# Patient Record
Sex: Male | Born: 1945 | Race: Black or African American | Hispanic: No | Marital: Married | Smoking: Never smoker
Health system: Southern US, Community
[De-identification: ages and names within clinical notes are randomized; demographics above are authoritative.]

## PROBLEM LIST (undated history)

## (undated) DIAGNOSIS — R5383 Other fatigue: Secondary | ICD-10-CM

## (undated) DIAGNOSIS — M1990 Unspecified osteoarthritis, unspecified site: Secondary | ICD-10-CM

## (undated) DIAGNOSIS — G4733 Obstructive sleep apnea (adult) (pediatric): Secondary | ICD-10-CM

## (undated) DIAGNOSIS — C61 Malignant neoplasm of prostate: Secondary | ICD-10-CM

## (undated) DIAGNOSIS — R590 Localized enlarged lymph nodes: Secondary | ICD-10-CM

## (undated) DIAGNOSIS — Z87448 Personal history of other diseases of urinary system: Secondary | ICD-10-CM

## (undated) DIAGNOSIS — N1330 Unspecified hydronephrosis: Secondary | ICD-10-CM

## (undated) DIAGNOSIS — R3915 Urgency of urination: Secondary | ICD-10-CM

## (undated) DIAGNOSIS — Z86718 Personal history of other venous thrombosis and embolism: Secondary | ICD-10-CM

## (undated) DIAGNOSIS — Z95828 Presence of other vascular implants and grafts: Secondary | ICD-10-CM

## (undated) DIAGNOSIS — N529 Male erectile dysfunction, unspecified: Secondary | ICD-10-CM

## (undated) DIAGNOSIS — Z86711 Personal history of pulmonary embolism: Secondary | ICD-10-CM

## (undated) DIAGNOSIS — R110 Nausea: Secondary | ICD-10-CM

## (undated) DIAGNOSIS — Z9989 Dependence on other enabling machines and devices: Secondary | ICD-10-CM

## (undated) DIAGNOSIS — T451X5A Adverse effect of antineoplastic and immunosuppressive drugs, initial encounter: Secondary | ICD-10-CM

## (undated) DIAGNOSIS — C7951 Secondary malignant neoplasm of bone: Secondary | ICD-10-CM

## (undated) DIAGNOSIS — N2889 Other specified disorders of kidney and ureter: Secondary | ICD-10-CM

## (undated) DIAGNOSIS — I10 Essential (primary) hypertension: Secondary | ICD-10-CM

## (undated) HISTORY — DX: Malignant neoplasm of prostate: C61

## (undated) HISTORY — PX: TOTAL KNEE ARTHROPLASTY: SHX125

## (undated) HISTORY — PX: OTHER SURGICAL HISTORY: SHX169

## (undated) HISTORY — DX: Unspecified osteoarthritis, unspecified site: M19.90

## (undated) HISTORY — DX: Essential (primary) hypertension: I10

## (undated) SURGERY — CYSTOSCOPY, FLEXIBLE, WITH STENT REPLACEMENT
Anesthesia: Choice | Laterality: Right

---

## 1999-03-10 ENCOUNTER — Ambulatory Visit (HOSPITAL_BASED_OUTPATIENT_CLINIC_OR_DEPARTMENT_OTHER): Admission: RE | Admit: 1999-03-10 | Discharge: 1999-03-10 | Payer: Self-pay | Admitting: General Surgery

## 1999-12-23 ENCOUNTER — Other Ambulatory Visit: Admission: RE | Admit: 1999-12-23 | Discharge: 1999-12-23 | Payer: Self-pay | Admitting: Urology

## 2000-02-09 HISTORY — PX: PROSTATECTOMY: SHX69

## 2000-02-24 ENCOUNTER — Encounter: Payer: Self-pay | Admitting: Urology

## 2000-02-29 ENCOUNTER — Encounter (INDEPENDENT_AMBULATORY_CARE_PROVIDER_SITE_OTHER): Payer: Self-pay | Admitting: Specialist

## 2000-02-29 ENCOUNTER — Inpatient Hospital Stay (HOSPITAL_COMMUNITY): Admission: RE | Admit: 2000-02-29 | Discharge: 2000-03-02 | Payer: Self-pay | Admitting: Urology

## 2000-06-22 ENCOUNTER — Encounter: Admission: RE | Admit: 2000-06-22 | Discharge: 2000-09-20 | Payer: Self-pay | Admitting: Radiation Oncology

## 2001-05-29 ENCOUNTER — Encounter: Payer: Self-pay | Admitting: Urology

## 2001-06-05 ENCOUNTER — Inpatient Hospital Stay (HOSPITAL_COMMUNITY): Admission: RE | Admit: 2001-06-05 | Discharge: 2001-06-06 | Payer: Self-pay | Admitting: Urology

## 2003-07-17 ENCOUNTER — Encounter: Payer: Self-pay | Admitting: Family Medicine

## 2003-07-17 ENCOUNTER — Encounter: Admission: RE | Admit: 2003-07-17 | Discharge: 2003-07-17 | Payer: Self-pay | Admitting: Family Medicine

## 2003-10-12 HISTORY — PX: PENILE PROSTHESIS IMPLANT: SHX240

## 2004-02-05 ENCOUNTER — Encounter: Admission: RE | Admit: 2004-02-05 | Discharge: 2004-02-05 | Payer: Self-pay | Admitting: Family Medicine

## 2004-03-26 ENCOUNTER — Inpatient Hospital Stay (HOSPITAL_COMMUNITY): Admission: RE | Admit: 2004-03-26 | Discharge: 2004-04-04 | Payer: Self-pay | Admitting: Orthopedic Surgery

## 2006-11-24 ENCOUNTER — Ambulatory Visit: Payer: Self-pay | Admitting: Gastroenterology

## 2006-12-01 ENCOUNTER — Encounter: Admission: RE | Admit: 2006-12-01 | Discharge: 2006-12-01 | Payer: Self-pay | Admitting: Family Medicine

## 2006-12-05 ENCOUNTER — Ambulatory Visit: Payer: Self-pay | Admitting: Gastroenterology

## 2006-12-05 ENCOUNTER — Encounter (INDEPENDENT_AMBULATORY_CARE_PROVIDER_SITE_OTHER): Payer: Self-pay | Admitting: Specialist

## 2006-12-15 ENCOUNTER — Encounter (HOSPITAL_COMMUNITY): Admission: RE | Admit: 2006-12-15 | Discharge: 2006-12-20 | Payer: Self-pay | Admitting: Urology

## 2008-02-27 ENCOUNTER — Encounter: Admission: RE | Admit: 2008-02-27 | Discharge: 2008-02-27 | Payer: Self-pay | Admitting: Family Medicine

## 2008-06-19 ENCOUNTER — Ambulatory Visit (HOSPITAL_COMMUNITY): Admission: RE | Admit: 2008-06-19 | Discharge: 2008-06-19 | Payer: Self-pay | Admitting: Urology

## 2008-09-03 ENCOUNTER — Encounter: Admission: RE | Admit: 2008-09-03 | Discharge: 2008-09-03 | Payer: Self-pay | Admitting: Family Medicine

## 2008-09-07 ENCOUNTER — Inpatient Hospital Stay (HOSPITAL_COMMUNITY): Admission: EM | Admit: 2008-09-07 | Discharge: 2008-09-11 | Payer: Self-pay | Admitting: Emergency Medicine

## 2008-09-09 ENCOUNTER — Ambulatory Visit: Payer: Self-pay | Admitting: Vascular Surgery

## 2008-09-09 ENCOUNTER — Encounter (INDEPENDENT_AMBULATORY_CARE_PROVIDER_SITE_OTHER): Payer: Self-pay | Admitting: Internal Medicine

## 2008-10-11 HISTORY — PX: INGUINAL HERNIA REPAIR: SUR1180

## 2009-01-06 ENCOUNTER — Ambulatory Visit: Payer: Self-pay | Admitting: Surgery

## 2009-01-08 ENCOUNTER — Ambulatory Visit: Payer: Self-pay | Admitting: Surgery

## 2009-03-25 ENCOUNTER — Ambulatory Visit (HOSPITAL_COMMUNITY): Admission: RE | Admit: 2009-03-25 | Discharge: 2009-03-25 | Payer: Self-pay | Admitting: Surgery

## 2009-03-25 ENCOUNTER — Ambulatory Visit: Payer: Self-pay | Admitting: Surgery

## 2009-04-23 ENCOUNTER — Inpatient Hospital Stay (HOSPITAL_COMMUNITY): Admission: RE | Admit: 2009-04-23 | Discharge: 2009-04-27 | Payer: Self-pay | Admitting: Orthopedic Surgery

## 2009-05-05 ENCOUNTER — Ambulatory Visit: Payer: Self-pay | Admitting: Surgery

## 2009-05-29 ENCOUNTER — Ambulatory Visit: Payer: Self-pay | Admitting: Surgery

## 2009-05-29 ENCOUNTER — Ambulatory Visit (HOSPITAL_COMMUNITY): Admission: RE | Admit: 2009-05-29 | Discharge: 2009-05-29 | Payer: Self-pay | Admitting: Surgery

## 2009-10-02 ENCOUNTER — Ambulatory Visit: Payer: Self-pay | Admitting: Oncology

## 2009-10-17 LAB — TESTOSTERONE: Testosterone: 25.16 ng/dL — ABNORMAL LOW (ref 350–890)

## 2009-10-17 LAB — COMPREHENSIVE METABOLIC PANEL
Albumin: 4.6 g/dL (ref 3.5–5.2)
BUN: 11 mg/dL (ref 6–23)
CO2: 23 mEq/L (ref 19–32)
Creatinine, Ser: 1.06 mg/dL (ref 0.40–1.50)
Potassium: 3.8 mEq/L (ref 3.5–5.3)
Total Protein: 7.7 g/dL (ref 6.0–8.3)

## 2009-10-17 LAB — CBC WITH DIFFERENTIAL/PLATELET
BASO%: 0.4 % (ref 0.0–2.0)
Eosinophils Absolute: 0.5 10*3/uL (ref 0.0–0.5)
HCT: 36.4 % — ABNORMAL LOW (ref 38.4–49.9)
HGB: 11.8 g/dL — ABNORMAL LOW (ref 13.0–17.1)
MONO#: 0.5 10*3/uL (ref 0.1–0.9)
NEUT%: 64.7 % (ref 39.0–75.0)
RBC: 4.35 10*6/uL (ref 4.20–5.82)
WBC: 7.8 10*3/uL (ref 4.0–10.3)

## 2009-10-17 LAB — PSA: PSA: 15.74 ng/mL — ABNORMAL HIGH (ref 0.10–4.00)

## 2009-10-23 ENCOUNTER — Ambulatory Visit (HOSPITAL_COMMUNITY): Admission: RE | Admit: 2009-10-23 | Discharge: 2009-10-23 | Payer: Self-pay | Admitting: Oncology

## 2009-11-03 ENCOUNTER — Ambulatory Visit (HOSPITAL_COMMUNITY): Admission: RE | Admit: 2009-11-03 | Discharge: 2009-11-03 | Payer: Self-pay | Admitting: Oncology

## 2009-11-07 ENCOUNTER — Ambulatory Visit: Payer: Self-pay | Admitting: Oncology

## 2009-12-04 LAB — COMPREHENSIVE METABOLIC PANEL
Albumin: 4.3 g/dL (ref 3.5–5.2)
BUN: 10 mg/dL (ref 6–23)
CO2: 25 mEq/L (ref 19–32)
Calcium: 9.1 mg/dL (ref 8.4–10.5)
Chloride: 104 mEq/L (ref 96–112)
Creatinine, Ser: 0.88 mg/dL (ref 0.40–1.50)
Potassium: 4 mEq/L (ref 3.5–5.3)

## 2009-12-04 LAB — PSA: PSA: 18.64 ng/mL — ABNORMAL HIGH (ref 0.10–4.00)

## 2009-12-04 LAB — CBC WITH DIFFERENTIAL/PLATELET
Basophils Absolute: 0 10*3/uL (ref 0.0–0.1)
Eosinophils Absolute: 0.3 10*3/uL (ref 0.0–0.5)
HCT: 36 % — ABNORMAL LOW (ref 38.4–49.9)
HGB: 11.6 g/dL — ABNORMAL LOW (ref 13.0–17.1)
MONO#: 0.4 10*3/uL (ref 0.1–0.9)
NEUT#: 4.6 10*3/uL (ref 1.5–6.5)
NEUT%: 67.2 % (ref 39.0–75.0)
RDW: 16 % — ABNORMAL HIGH (ref 11.0–14.6)
WBC: 6.9 10*3/uL (ref 4.0–10.3)
lymph#: 1.5 10*3/uL (ref 0.9–3.3)

## 2010-01-02 ENCOUNTER — Ambulatory Visit: Payer: Self-pay | Admitting: Oncology

## 2010-01-06 LAB — CBC WITH DIFFERENTIAL/PLATELET
Eosinophils Absolute: 0.5 10*3/uL (ref 0.0–0.5)
LYMPH%: 24.2 % (ref 14.0–49.0)
MCV: 84.6 fL (ref 79.3–98.0)
MONO%: 7.2 % (ref 0.0–14.0)
NEUT#: 4.2 10*3/uL (ref 1.5–6.5)
NEUT%: 61.5 % (ref 39.0–75.0)
Platelets: 218 10*3/uL (ref 140–400)
RBC: 4.17 10*6/uL — ABNORMAL LOW (ref 4.20–5.82)

## 2010-01-06 LAB — COMPREHENSIVE METABOLIC PANEL
Alkaline Phosphatase: 66 U/L (ref 39–117)
BUN: 12 mg/dL (ref 6–23)
Creatinine, Ser: 0.86 mg/dL (ref 0.40–1.50)
Glucose, Bld: 136 mg/dL — ABNORMAL HIGH (ref 70–99)
Sodium: 140 mEq/L (ref 135–145)
Total Bilirubin: 0.4 mg/dL (ref 0.3–1.2)
Total Protein: 7.3 g/dL (ref 6.0–8.3)

## 2010-01-06 LAB — TESTOSTERONE: Testosterone: 23.85 ng/dL — ABNORMAL LOW (ref 350–890)

## 2010-02-09 ENCOUNTER — Ambulatory Visit: Payer: Self-pay | Admitting: Oncology

## 2010-02-10 LAB — CBC WITH DIFFERENTIAL/PLATELET
Eosinophils Absolute: 0.4 10*3/uL (ref 0.0–0.5)
HCT: 33.8 % — ABNORMAL LOW (ref 38.4–49.9)
HGB: 10.9 g/dL — ABNORMAL LOW (ref 13.0–17.1)
MCH: 27.1 pg — ABNORMAL LOW (ref 27.2–33.4)
MCHC: 32.2 g/dL (ref 32.0–36.0)
MONO#: 0.6 10*3/uL (ref 0.1–0.9)
MONO%: 7.3 % (ref 0.0–14.0)
RBC: 4.02 10*6/uL — ABNORMAL LOW (ref 4.20–5.82)
RDW: 16.3 % — ABNORMAL HIGH (ref 11.0–14.6)
WBC: 7.9 10*3/uL (ref 4.0–10.3)
lymph#: 1.8 10*3/uL (ref 0.9–3.3)

## 2010-02-10 LAB — COMPREHENSIVE METABOLIC PANEL
ALT: 23 U/L (ref 0–53)
BUN: 11 mg/dL (ref 6–23)
CO2: 29 mEq/L (ref 19–32)
Calcium: 8.8 mg/dL (ref 8.4–10.5)
Creatinine, Ser: 0.91 mg/dL (ref 0.40–1.50)
Potassium: 3.4 mEq/L — ABNORMAL LOW (ref 3.5–5.3)
Sodium: 142 mEq/L (ref 135–145)
Total Bilirubin: 0.5 mg/dL (ref 0.3–1.2)
Total Protein: 7 g/dL (ref 6.0–8.3)

## 2010-03-11 ENCOUNTER — Ambulatory Visit: Payer: Self-pay | Admitting: Oncology

## 2010-03-11 LAB — CBC WITH DIFFERENTIAL/PLATELET
Eosinophils Absolute: 0.4 10*3/uL (ref 0.0–0.5)
HCT: 36 % — ABNORMAL LOW (ref 38.4–49.9)
MCHC: 31.9 g/dL — ABNORMAL LOW (ref 32.0–36.0)
MONO#: 0.5 10*3/uL (ref 0.1–0.9)
MONO%: 7.4 % (ref 0.0–14.0)
Platelets: 199 10*3/uL (ref 140–400)
RBC: 4.33 10*6/uL (ref 4.20–5.82)
RDW: 16 % — ABNORMAL HIGH (ref 11.0–14.6)
lymph#: 2 10*3/uL (ref 0.9–3.3)

## 2010-04-16 ENCOUNTER — Ambulatory Visit: Payer: Self-pay | Admitting: Oncology

## 2010-04-20 LAB — COMPREHENSIVE METABOLIC PANEL
ALT: 26 U/L (ref 0–53)
Albumin: 4.2 g/dL (ref 3.5–5.2)
Alkaline Phosphatase: 71 U/L (ref 39–117)
BUN: 11 mg/dL (ref 6–23)
CO2: 25 mEq/L (ref 19–32)
Calcium: 9.2 mg/dL (ref 8.4–10.5)
Creatinine, Ser: 0.84 mg/dL (ref 0.40–1.50)
Glucose, Bld: 151 mg/dL — ABNORMAL HIGH (ref 70–99)

## 2010-04-20 LAB — PSA: PSA: 43.58 ng/mL — ABNORMAL HIGH (ref 0.10–4.00)

## 2010-04-20 LAB — CBC WITH DIFFERENTIAL/PLATELET
Basophils Absolute: 0 10*3/uL (ref 0.0–0.1)
EOS%: 9.8 % — ABNORMAL HIGH (ref 0.0–7.0)
HCT: 36.8 % — ABNORMAL LOW (ref 38.4–49.9)
HGB: 11.7 g/dL — ABNORMAL LOW (ref 13.0–17.1)
MCHC: 31.8 g/dL — ABNORMAL LOW (ref 32.0–36.0)
MONO#: 0.3 10*3/uL (ref 0.1–0.9)
NEUT#: 3.8 10*3/uL (ref 1.5–6.5)
NEUT%: 62.7 % (ref 39.0–75.0)
Platelets: 154 10*3/uL (ref 140–400)
RDW: 16.2 % — ABNORMAL HIGH (ref 11.0–14.6)
nRBC: 0 % (ref 0–0)

## 2010-05-12 LAB — CBC WITH DIFFERENTIAL/PLATELET
BASO%: 0.5 % (ref 0.0–2.0)
Basophils Absolute: 0 10*3/uL (ref 0.0–0.1)
EOS%: 24 % — ABNORMAL HIGH (ref 0.0–7.0)
Eosinophils Absolute: 1.7 10*3/uL — ABNORMAL HIGH (ref 0.0–0.5)
HCT: 34.5 % — ABNORMAL LOW (ref 38.4–49.9)
LYMPH%: 26.9 % (ref 14.0–49.0)
MCH: 27.1 pg — ABNORMAL LOW (ref 27.2–33.4)
Platelets: 206 10*3/uL (ref 140–400)
WBC: 7.2 10*3/uL (ref 4.0–10.3)

## 2010-05-12 LAB — COMPREHENSIVE METABOLIC PANEL
Alkaline Phosphatase: 73 U/L (ref 39–117)
BUN: 14 mg/dL (ref 6–23)
Calcium: 9.3 mg/dL (ref 8.4–10.5)
Chloride: 106 mEq/L (ref 96–112)
Creatinine, Ser: 0.9 mg/dL (ref 0.40–1.50)
Glucose, Bld: 106 mg/dL — ABNORMAL HIGH (ref 70–99)
Sodium: 141 mEq/L (ref 135–145)

## 2010-06-11 ENCOUNTER — Ambulatory Visit: Payer: Self-pay | Admitting: Oncology

## 2010-06-25 LAB — CBC WITH DIFFERENTIAL/PLATELET
HGB: 11.3 g/dL — ABNORMAL LOW (ref 13.0–17.1)
MCH: 25.7 pg — ABNORMAL LOW (ref 27.2–33.4)
MCHC: 30.9 g/dL — ABNORMAL LOW (ref 32.0–36.0)
MONO#: 0.4 10*3/uL (ref 0.1–0.9)
NEUT#: 3.4 10*3/uL (ref 1.5–6.5)
NEUT%: 60.5 % (ref 39.0–75.0)
Platelets: 200 10*3/uL (ref 140–400)
WBC: 5.6 10*3/uL (ref 4.0–10.3)

## 2010-06-25 LAB — PSA: PSA: 64.36 ng/mL — ABNORMAL HIGH (ref 0.10–4.00)

## 2010-06-25 LAB — COMPREHENSIVE METABOLIC PANEL
AST: 18 U/L (ref 0–37)
Albumin: 4 g/dL (ref 3.5–5.2)
Alkaline Phosphatase: 67 U/L (ref 39–117)
BUN: 12 mg/dL (ref 6–23)
Calcium: 8.7 mg/dL (ref 8.4–10.5)
Creatinine, Ser: 0.92 mg/dL (ref 0.40–1.50)
Total Protein: 6.8 g/dL (ref 6.0–8.3)

## 2010-08-04 ENCOUNTER — Ambulatory Visit: Payer: Self-pay | Admitting: Oncology

## 2010-08-06 LAB — COMPREHENSIVE METABOLIC PANEL
ALT: 19 U/L (ref 0–53)
Albumin: 3.9 g/dL (ref 3.5–5.2)
Alkaline Phosphatase: 63 U/L (ref 39–117)
BUN: 13 mg/dL (ref 6–23)
CO2: 27 mEq/L (ref 19–32)
Calcium: 8.9 mg/dL (ref 8.4–10.5)
Chloride: 107 mEq/L (ref 96–112)
Glucose, Bld: 122 mg/dL — ABNORMAL HIGH (ref 70–99)
Potassium: 3.6 mEq/L (ref 3.5–5.3)

## 2010-08-06 LAB — CBC WITH DIFFERENTIAL/PLATELET
BASO%: 0.4 % (ref 0.0–2.0)
HCT: 34 % — ABNORMAL LOW (ref 38.4–49.9)
LYMPH%: 24.7 % (ref 14.0–49.0)
MONO%: 6.9 % (ref 0.0–14.0)
NEUT#: 3.4 10*3/uL (ref 1.5–6.5)
NEUT%: 61.2 % (ref 39.0–75.0)
Platelets: 205 10*3/uL (ref 140–400)
WBC: 5.6 10*3/uL (ref 4.0–10.3)

## 2010-08-27 ENCOUNTER — Ambulatory Visit (HOSPITAL_COMMUNITY): Admission: RE | Admit: 2010-08-27 | Discharge: 2010-08-27 | Payer: Self-pay | Admitting: Oncology

## 2010-09-04 ENCOUNTER — Ambulatory Visit (HOSPITAL_COMMUNITY)
Admission: RE | Admit: 2010-09-04 | Discharge: 2010-09-04 | Payer: Self-pay | Source: Home / Self Care | Admitting: Oncology

## 2010-10-02 ENCOUNTER — Ambulatory Visit: Payer: Self-pay | Admitting: Oncology

## 2010-10-07 LAB — CBC WITH DIFFERENTIAL/PLATELET
Basophils Absolute: 0 10*3/uL (ref 0.0–0.1)
EOS%: 6.3 % (ref 0.0–7.0)
HGB: 11.3 g/dL — ABNORMAL LOW (ref 13.0–17.1)
MCH: 27 pg — ABNORMAL LOW (ref 27.2–33.4)
MCV: 83.6 fL (ref 79.3–98.0)
MONO%: 7.6 % (ref 0.0–14.0)
NEUT#: 4 10*3/uL (ref 1.5–6.5)
RBC: 4.2 10*6/uL (ref 4.20–5.82)
RDW: 17.4 % — ABNORMAL HIGH (ref 11.0–14.6)
lymph#: 1.3 10*3/uL (ref 0.9–3.3)

## 2010-10-08 LAB — COMPREHENSIVE METABOLIC PANEL
ALT: 18 U/L (ref 0–53)
AST: 14 U/L (ref 0–37)
Albumin: 4.1 g/dL (ref 3.5–5.2)
Alkaline Phosphatase: 66 U/L (ref 39–117)
BUN: 11 mg/dL (ref 6–23)
Calcium: 8.9 mg/dL (ref 8.4–10.5)
Chloride: 106 mEq/L (ref 96–112)
Potassium: 3.7 mEq/L (ref 3.5–5.3)
Sodium: 143 mEq/L (ref 135–145)
Total Protein: 7 g/dL (ref 6.0–8.3)

## 2010-11-05 ENCOUNTER — Ambulatory Visit: Payer: Self-pay | Admitting: Oncology

## 2010-11-09 LAB — CBC WITH DIFFERENTIAL/PLATELET
EOS%: 0.8 % (ref 0.0–7.0)
HCT: 35.8 % — ABNORMAL LOW (ref 38.4–49.9)
HGB: 11.9 g/dL — ABNORMAL LOW (ref 13.0–17.1)
MCH: 27.5 pg (ref 27.2–33.4)
MCHC: 33.2 g/dL (ref 32.0–36.0)
MCV: 82.9 fL (ref 79.3–98.0)
NEUT#: 6.7 10*3/uL — ABNORMAL HIGH (ref 1.5–6.5)
Platelets: 217 10*3/uL (ref 140–400)
RDW: 16 % — ABNORMAL HIGH (ref 11.0–14.6)
WBC: 8.6 10*3/uL (ref 4.0–10.3)

## 2010-11-09 LAB — COMPREHENSIVE METABOLIC PANEL
ALT: 14 U/L (ref 0–53)
AST: 14 U/L (ref 0–37)
Calcium: 9.3 mg/dL (ref 8.4–10.5)
Creatinine, Ser: 1.32 mg/dL (ref 0.40–1.50)
Total Bilirubin: 0.3 mg/dL (ref 0.3–1.2)
Total Protein: 7.2 g/dL (ref 6.0–8.3)

## 2010-11-09 LAB — PSA: PSA: 154.39 ng/mL — ABNORMAL HIGH (ref <=4.00)

## 2010-12-08 ENCOUNTER — Other Ambulatory Visit: Payer: Self-pay | Admitting: Medical

## 2010-12-08 ENCOUNTER — Encounter (HOSPITAL_BASED_OUTPATIENT_CLINIC_OR_DEPARTMENT_OTHER): Payer: 59 | Admitting: Oncology

## 2010-12-08 DIAGNOSIS — Z5111 Encounter for antineoplastic chemotherapy: Secondary | ICD-10-CM

## 2010-12-08 DIAGNOSIS — C61 Malignant neoplasm of prostate: Secondary | ICD-10-CM

## 2010-12-08 LAB — CBC WITH DIFFERENTIAL/PLATELET
BASO%: 0.5 % (ref 0.0–2.0)
EOS%: 0.7 % (ref 0.0–7.0)
Eosinophils Absolute: 0.1 10*3/uL (ref 0.0–0.5)
LYMPH%: 15.5 % (ref 14.0–49.0)
MCH: 27.6 pg (ref 27.2–33.4)
MCHC: 32.8 g/dL (ref 32.0–36.0)
MCV: 84.1 fL (ref 79.3–98.0)
MONO%: 3.7 % (ref 0.0–14.0)
Platelets: 221 10*3/uL (ref 140–400)
RBC: 4.11 10*6/uL — ABNORMAL LOW (ref 4.20–5.82)
RDW: 16.8 % — ABNORMAL HIGH (ref 11.0–14.6)

## 2010-12-08 LAB — PSA: PSA: 113.77 ng/mL — ABNORMAL HIGH (ref <=4.00)

## 2010-12-08 LAB — COMPREHENSIVE METABOLIC PANEL
BUN: 20 mg/dL (ref 6–23)
CO2: 25 mEq/L (ref 19–32)
Calcium: 9.1 mg/dL (ref 8.4–10.5)
Chloride: 103 mEq/L (ref 96–112)
Creatinine, Ser: 1.3 mg/dL (ref 0.40–1.50)
Glucose, Bld: 180 mg/dL — ABNORMAL HIGH (ref 70–99)

## 2011-01-16 LAB — PROTIME-INR: INR: 1 (ref 0.00–1.49)

## 2011-01-16 LAB — POCT I-STAT, CHEM 8
Hemoglobin: 10.5 g/dL — ABNORMAL LOW (ref 13.0–17.0)
Sodium: 143 mEq/L (ref 135–145)
TCO2: 26 mmol/L (ref 0–100)

## 2011-01-17 LAB — CBC
HCT: 24.8 % — ABNORMAL LOW (ref 39.0–52.0)
Hemoglobin: 9.2 g/dL — ABNORMAL LOW (ref 13.0–17.0)
MCV: 85.9 fL (ref 78.0–100.0)
Platelets: 165 10*3/uL (ref 150–400)
RBC: 2.88 MIL/uL — ABNORMAL LOW (ref 4.22–5.81)
RBC: 3.24 MIL/uL — ABNORMAL LOW (ref 4.22–5.81)
RDW: 14.5 % (ref 11.5–15.5)
WBC: 8.9 10*3/uL (ref 4.0–10.5)
WBC: 9.5 10*3/uL (ref 4.0–10.5)

## 2011-01-17 LAB — PROTIME-INR
INR: 1.5 (ref 0.00–1.49)
INR: 1.7 — ABNORMAL HIGH (ref 0.00–1.49)
INR: 1.7 — ABNORMAL HIGH (ref 0.00–1.49)
Prothrombin Time: 18.9 seconds — ABNORMAL HIGH (ref 11.6–15.2)
Prothrombin Time: 20.6 seconds — ABNORMAL HIGH (ref 11.6–15.2)
Prothrombin Time: 21.1 seconds — ABNORMAL HIGH (ref 11.6–15.2)

## 2011-01-17 LAB — BASIC METABOLIC PANEL
Calcium: 8.5 mg/dL (ref 8.4–10.5)
Creatinine, Ser: 1.09 mg/dL (ref 0.4–1.5)
GFR calc Af Amer: >60 mL/min (ref >60)
GFR calc non Af Amer: >60 mL/min (ref >60)
Glucose, Bld: 134 mg/dL — ABNORMAL HIGH (ref 70–99)
Sodium: 140 mEq/L (ref 135–145)

## 2011-01-18 LAB — COMPREHENSIVE METABOLIC PANEL
ALT: 22 U/L (ref 0–53)
AST: 20 U/L (ref 0–37)
Alkaline Phosphatase: 63 U/L (ref 39–117)
CO2: 30 mEq/L (ref 19–32)
Chloride: 103 mEq/L (ref 96–112)
GFR calc Af Amer: >60 mL/min (ref >60)
GFR calc non Af Amer: >60 mL/min (ref >60)
Glucose, Bld: 90 mg/dL (ref 70–99)
Potassium: 3.7 mEq/L (ref 3.5–5.1)
Sodium: 140 mEq/L (ref 135–145)

## 2011-01-18 LAB — BASIC METABOLIC PANEL
CO2: 29 mEq/L (ref 19–32)
Calcium: 8.4 mg/dL (ref 8.4–10.5)
Creatinine, Ser: 0.9 mg/dL (ref 0.4–1.5)
GFR calc Af Amer: >60 mL/min (ref >60)
Sodium: 138 mEq/L (ref 135–145)

## 2011-01-18 LAB — TYPE AND SCREEN
ABO/RH(D): A POS
Antibody Screen: NEGATIVE

## 2011-01-18 LAB — URINALYSIS, ROUTINE W REFLEX MICROSCOPIC
Bilirubin Urine: NEGATIVE
Glucose, UA: NEGATIVE mg/dL
Hgb urine dipstick: NEGATIVE
Protein, ur: NEGATIVE mg/dL
Specific Gravity, Urine: 1.014 (ref 1.005–1.030)
Urobilinogen, UA: 1 mg/dL (ref 0.0–1.0)

## 2011-01-18 LAB — DIFFERENTIAL
Basophils Relative: 0 % (ref 0–1)
Eosinophils Absolute: 0.3 10*3/uL (ref 0.0–0.7)
Eosinophils Relative: 3 % (ref 0–5)
Neutrophils Relative %: 72 % (ref 43–77)

## 2011-01-18 LAB — PROTIME-INR
INR: 0.9 (ref 0.00–1.49)
INR: 1.1 (ref 0.00–1.49)
Prothrombin Time: 12.7 seconds (ref 11.6–15.2)
Prothrombin Time: 14.5 seconds (ref 11.6–15.2)

## 2011-01-18 LAB — POCT I-STAT, CHEM 8
BUN: 9 mg/dL (ref 6–23)
Calcium, Ion: 1.17 mmol/L (ref 1.12–1.32)
Chloride: 105 mEq/L (ref 96–112)
HCT: 37 % — ABNORMAL LOW (ref 39.0–52.0)
Potassium: 3.5 mEq/L (ref 3.5–5.1)
Sodium: 142 mEq/L (ref 135–145)

## 2011-01-18 LAB — CBC
Hemoglobin: 10 g/dL — ABNORMAL LOW (ref 13.0–17.0)
Hemoglobin: 12.6 g/dL — ABNORMAL LOW (ref 13.0–17.0)
RBC: 3.53 MIL/uL — ABNORMAL LOW (ref 4.22–5.81)
RBC: 4.46 MIL/uL (ref 4.22–5.81)
WBC: 9.6 10*3/uL (ref 4.0–10.5)
WBC: 9.7 10*3/uL (ref 4.0–10.5)

## 2011-01-18 LAB — ABO/RH: ABO/RH(D): A POS

## 2011-01-19 ENCOUNTER — Encounter (HOSPITAL_BASED_OUTPATIENT_CLINIC_OR_DEPARTMENT_OTHER): Payer: 59 | Admitting: Oncology

## 2011-01-19 ENCOUNTER — Other Ambulatory Visit: Payer: Self-pay | Admitting: Oncology

## 2011-01-19 DIAGNOSIS — Z5111 Encounter for antineoplastic chemotherapy: Secondary | ICD-10-CM

## 2011-01-19 DIAGNOSIS — I10 Essential (primary) hypertension: Secondary | ICD-10-CM

## 2011-01-19 DIAGNOSIS — C61 Malignant neoplasm of prostate: Secondary | ICD-10-CM

## 2011-01-19 LAB — COMPREHENSIVE METABOLIC PANEL
ALT: 23 U/L (ref 0–53)
CO2: 22 mEq/L (ref 19–32)
Calcium: 9.1 mg/dL (ref 8.4–10.5)
Chloride: 103 mEq/L (ref 96–112)
Creatinine, Ser: 1.34 mg/dL (ref 0.40–1.50)
Glucose, Bld: 129 mg/dL — ABNORMAL HIGH (ref 70–99)
Total Protein: 6.5 g/dL (ref 6.0–8.3)

## 2011-01-19 LAB — CBC WITH DIFFERENTIAL/PLATELET
BASO%: 0.4 % (ref 0.0–2.0)
Eosinophils Absolute: 0.1 10*3/uL (ref 0.0–0.5)
HCT: 34.6 % — ABNORMAL LOW (ref 38.4–49.9)
HGB: 11.6 g/dL — ABNORMAL LOW (ref 13.0–17.1)
MCHC: 33.4 g/dL (ref 32.0–36.0)
MONO#: 0.4 10*3/uL (ref 0.1–0.9)
NEUT#: 7.1 10*3/uL — ABNORMAL HIGH (ref 1.5–6.5)
NEUT%: 82.3 % — ABNORMAL HIGH (ref 39.0–75.0)
Platelets: 213 10*3/uL (ref 140–400)
WBC: 8.6 10*3/uL (ref 4.0–10.3)
lymph#: 1 10*3/uL (ref 0.9–3.3)

## 2011-01-19 LAB — TESTOSTERONE: Testosterone: 16.97 ng/dL — ABNORMAL LOW (ref 250–890)

## 2011-01-19 LAB — PSA: PSA: 79.22 ng/mL — ABNORMAL HIGH (ref <=4.00)

## 2011-02-23 NOTE — H&P (Signed)
[name], [name]               ACCOUNT NO.:  [id number]   MEDICAL RECORD NO.:  [id number]          PATIENT TYPE:  INP   LOCATION:  1823                         FACILITY:  [facility]   PHYSICIAN:  Hollice Espy, M.D.DATE OF BIRTH:  1945/12/05   DATE OF ADMISSION:  09/07/2008  DATE OF DISCHARGE:                              HISTORY & PHYSICAL   ATTENDING PHYSICIAN:  Hollice Espy, [name]   PRIMARY CARE PHYSICIAN:  [name], [name]   CHIEF COMPLAINT:  Shortness of breath.   HISTORY OF PRESENT ILLNESS:  The patient is a 65 year old African  American male with past medical history of hypertension and obesity who  notes that for the past week he has noted increasing dyspnea on  exertion.  He has no cough, no fevers and no wheezing but he has noted  that even doing simple tasks such as walking up the stairs has suddenly  made him feel very short of breath.  This morning he started having some  episodes of chest pressure.  He described it as mid epigastric radiating  both sides and more of a slight pressure sensation.  He also noted some  associated right arm numbness.  He became concerned and came into the  emergency room for further evaluation.  In the emergency room the  patient had a chest x-ray done which was unremarkable other than noting  an enlarged heart but no signs of any acute infiltrate or overt failure.  Labs were done.  He had a normal white count, no shift, normal cardiac  markers and labs were otherwise negative.  His EKG was done which noted  a normal sinus rhythm with left axis deviation, although a left axis  deviation was seen on previous EKG several years ago.  Currently the  patient is doing well.  He is also noted by the way to have flipped T-  waves in the anterolateral leads which was not seen on previous EKG back  in 2005.  Currently the patient is doing okay.  He says he does not feel  any chest pressure currently.  As long as he is on oxygen he is  not  short of breath.  He denies any headaches, vision changes, dysphagia, no  current chest pain or palpitations.  No wheezing, no cough, no abdominal  pain.  No hematuria, dysuria, constipation, diarrhea, focal extremity  numbness, weakness or tingling.   REVIEW OF SYSTEMS:  Otherwise negative.   PAST MEDICAL HISTORY:  Includes hypertension and obesity.   MEDICATIONS:  He is on p.r.n. Aleve, Norvasc, Os-Cal and Diovan/HCT.   ALLERGIES:  No known drug allergies.   SOCIAL HISTORY:  No tobacco, heavy alcohol or drug use.   FAMILY HISTORY:  Notable for mom with a history of heart attack in her  28s.   PHYSICAL EXAMINATION:  VITAL SIGNS:  On admission temperature 97.4,  heart rate 75, blood pressure 130/71, respirations 22, O2 sat 95% on 4  liters currently satting 94% on 2 liters.  GENERAL:  He is alert and oriented x3 in no apparent distress.  HEENT:  Normocephalic  and atraumatic.  Mucous membranes are moist.  NECK:  He has no carotid bruits.  HEART:  Regular rate and rhythm, S1-S2.  He has a very soft 2/6 ejection  murmur.  LUNGS:  Clear to auscultation bilaterally.  ABDOMEN:  Soft, nontender, nondistended.  Positive bowel sounds.  EXTREMITIES:  Show no clubbing or cyanosis.  Trace pitting edema.   LAB WORK:  Sodium 141, potassium 3.4, chloride 108, bicarb 25, BUN 11,  creatinine 0.97, glucose 102, white count 10.1, no shift, H and H 12.4  and 38, MCV 85, platelet count 199.  Urinalysis negative.  Cardiac  markers, CPK 61, MB 1.9, troponin-I less than 0.05.  Chest x-ray shows  cardiomegaly without acute disease.  I put in a request for a BNP, ABG  and D-dimer all of which are currently pending.   ASSESSMENT AND PLAN:  1. Dyspnea on exertion.  2. Chest pressure.  3. Hypertension.   Will check a BNP, D-dimer and enzymes x3 for our workup to make sure  that this is not cardiac related or possible underlying CHF with  diastolic dysfunction or PE. If the workup is negative  then we will plan  to have outpatient stress test and will continue his medications,  Norvasc and Diovan.      Hollice Espy, [name]  Electronically Signed     SKK/MEDQ  D:  09/07/2008  T:  09/07/2008  Job:  [id number]   cc:   [name], [name]

## 2011-02-23 NOTE — Op Note (Signed)
NAMECRIXUS, [name]               ACCOUNT NO.:  [id number]   MEDICAL RECORD NO.:  [id number]          PATIENT TYPE:  AMB   LOCATION:  SDS                          FACILITY:  [facility]   PHYSICIAN:  Juleen China IV, MDDATE OF BIRTH:  01/06/1946   DATE OF PROCEDURE:  03/25/2009  DATE OF DISCHARGE:  03/25/2009                               OPERATIVE REPORT   The patient underwent the following procedures:  1. Ultrasound access right common femoral vein.  2. Catheter and IVC.  3. IVC venogram.  4. Placement of a Bard Eclipse filter.   INDICATIONS:  [name] is a 65 year old gentleman with history of DVT  who is scheduled to undergo knee replacement surgery by Dr. Renae Fickle.  He  comes in for preoperative placement of a removable IVC filter.   PROCEDURE:  The patient was identified in the holding area and taken to  room 8, was placed supine on the table.  The patient's right leg was  prepped and draped in standard sterile fashion.  Time-out was called.  The right common femoral vein was identified with ultrasound, found to  be widely patent, easily compressible, 1% lidocaine was used for local  anesthesia.  The right femoral vein was accessed under ultrasound with  an 18-gauge needle and 0.35 Bentson wire was advanced into the inferior  vena cava under fluoroscopic visualization.  Over the wire, an OmniFlush  catheter was placed at the IVC bifurcation and IVC venogram was  obtained.  This revealed location of the renal veins.  There were no  aberrant renal veins.  The diameter of IVC measured proximal 24 mm.  There was no thrombus.  Next, the OmniFlush catheter was removed and the  filter introducing sheath  was advanced into the IVC over an 035 wire.  The filter was then loaded into the sheath and then successfully  deployed.  There was no tilting of the filter, the tip of the filter is  at the top of the L2 vertebral body.  After successful placement, the  sheath was removed and  manual pressure was held until hemostatic.   IMPRESSION:  Successful placement of a Bard Eclipse removable IVC  filter.      [name], MD  Electronically Signed     VWB/MEDQ  D:  03/26/2009  T:  03/26/2009  Job:  [id number]

## 2011-02-23 NOTE — Op Note (Signed)
NAMETENOCH, MCCLURE               ACCOUNT NO.:  [id number]   MEDICAL RECORD NO.:  [id number]          PATIENT TYPE:  AMB   LOCATION:  SDS                          FACILITY:  [facility]   PHYSICIAN:  Juleen China IV, MDDATE OF BIRTH:  Jun 01, 1946   DATE OF PROCEDURE:  05/29/2009  DATE OF DISCHARGE:                               OPERATIVE REPORT   PREOPERATIVE DIAGNOSIS:  Inferior vena cava filter.   POSTOPERATIVE DIAGNOSIS:  Inferior vena cava filter.   PROCEDURE PERFORMED:  1. Inferior vena cava filter removal.  2. Ultrasound access right internal jugular vein.  3. Catheter in inferior vena cava.  4. Inferior venacavogram.   INDICATIONS:  [name] is a 65 year old gentleman with a history of a  DVT, who had a preoperative placement of an IVC filter for knee surgery.  He is recovered from his surgery without incident and comes in to have  his filter removed.  He had a preprocedure ultrasound, which was  negative for DVT.   PROCEDURE:  The patient was identified in the holding area and taken to  room 8.  He was placed supine on the table.  The right neck was prepped  and draped in standard sterile fashion.  A time-out was called.  The  right internal jugular vein was evaluated with ultrasound and found to  be widely patent, easily compressible.  Lidocaine 1% was used for local  anesthesia.  A #11 blade was used to make a skin incision and a  micropuncture needle was used to access the right internal jugular vein  under ultrasound guidance, and 0.018 Mandrel wire was advanced under  fluoroscopic visualization into the superior vena cava.  Micropuncture  sheath was placed.  The wire and introducer were removed.  An 0.035  Bentson wire was then navigated into the inferior vena cava under  fluoroscopic visualization and the micropuncture sheath was removed and  a Omni flush catheter was advanced past the filter to the caval  bifurcation.  Contrast injection was performed.  This  revealed widely  patent IVC with no evidence of thrombus within the filter.  Next, the  Omni flush catheter was removed over a wire and a 7-French sheath was  positioned just proximal to the filter.  A 25-cm goosenecked snare was  then used to grab the hook of the filter.  The sheath was then advanced  over the hook and the filter was withdrawn into the sheath.  This was  done without significant difficulty.  The filter was then removed, and  it was inspected on the back table, and found to be  completely intact.  Next, the sheath was withdrawn and manual pressure  was held for hemostasis.  The patient tolerated the procedure well.  There were no complications.   IMPRESSION:  Successful removal of Bard infrarenal inferior vena cava  filter.      [name], MD  Electronically Signed     VWB/MEDQ  D:  05/29/2009  T:  05/29/2009  Job:  [id number]   cc:   Harvie Junior, [name]  Vincent Dr. Renae Fickle

## 2011-02-23 NOTE — Procedures (Signed)
DUPLEX DEEP VENOUS EXAM - LOWER EXTREMITY   INDICATION:  Postop IVC filter placement, rule out DVT.   HISTORY:  Edema:  No.  Trauma/Surgery:  IVC filter placed on 03/25/2009, right knee surgery on  04/23/2009 per patient, left knee surgery in 2005.  Pain:  No.  PE:  The patient states he had a PE in November or December of 2009.  Previous DVT:  Unknown.  Anticoagulants:  No.  Other:   DUPLEX EXAM:                CFV   SFV   PopV  PTV    GSV                R  L  R  L  R  L  R   L  R  L  Thrombosis    o  o  o  o  o  o  o   o  o  o  Spontaneous   +  +  +  +  +  +  +   +  +  +  Phasic        +  +  +  +  +  +  +   +  +  +  Augmentation  +  +  +  +  +  +  +   +  +  +  Compressible  +  +  +  +  +  +  +   +  +  +  Competent   Legend:  + - yes  o - no  p - partial  D - decreased   IMPRESSION:  No evidence of deep vein thrombosis noted in the bilateral  lower extremities.    _____________________________  V. Charlena Cross, MD   CH/MEDQ  D:  05/05/2009  T:  05/06/2009  Job:  [id number]

## 2011-02-23 NOTE — Assessment & Plan Note (Signed)
OFFICE VISIT   GEROGE, GILLIAM L  DOB:  Feb 27, 1946                                       05/05/2009  UEAVW#:[id number]   REASON FOR VISIT:  Follow up IVC filter.   HISTORY:  This is a 65 year old gentleman that I saw at the request of  Dr. Renae Fickle for preoperative filter placement.  On 03/25/09, the patient  underwent an infrarenal IVC filter placement without difficulty.  Subsequent to that, he underwent right knee surgery on 07/14 by Dr.  Luiz Blare.  He comes back in today for discussions of his filter.  He had  been on Coumadin for a DVT.  This was  stopped; however, after his knee  surgery, it has been restarted.   PHYSICAL EXAMINATION:  Blood pressure is 131/80, pulse 82.  Generally,  he is well-appearing in no distress.  Cardiovascular:  Regular rate and  rhythm.  Respirations nonlabored.  The patient is now ambulatory.  He  does have dressings on his right knee.   DIAGNOSTIC STUDIES:  Lower extremity duplex was performed, which reveals  no evidence of DVT in either lower extremity.   ASSESSMENT/PLAN:  I have tentatively scheduled the patient to have his  IVC filter removed on August 19th.  He will need to be off of his  Coumadin prior to this, and so I will leave that at the discretion of  Dr. Luiz Blare as to when it can be stopped, __________ filter retrieval.  Again, he is tentatively scheduled for August 19th.   [name], MD  Electronically Signed   VWB/MEDQ  D:  05/05/2009  T:  05/07/2009  Job:  [id number]   cc:   Harvie Junior, [name]  Dr. Doristine Section

## 2011-02-23 NOTE — Procedures (Signed)
DUPLEX DEEP VENOUS EXAM - LOWER EXTREMITY   INDICATION:  Preop for IVC filter.   HISTORY:  Edema:  No.  Trauma/Surgery:  Left knee replacement in 2005.  Pain:  No.  PE:  Yes.  Previous DVT:  Yes.  Anticoagulants:  Yes.  Other:   DUPLEX EXAM:                CFV   SFV   PopV  PTV    GSV                R  L  R  L  R  L  R   L  R  L  Thrombosis    0  0  0  0  0  0  0   0  0  0  Spontaneous   +  +  +  +  +  +  +   +  +  +  Phasic        +  +  +  +  +  +  +   +  +  +  Augmentation  +  +  +  +  +  +  +   +  +  +  Compressible  +  +  +  +  +  +  +   +  +  +  Competent     +  +  +  +  +  +  +   +  +  +   Legend:  + - yes  o - no  p - partial  D - decreased   IMPRESSION:  Duplex shows no evidence of deep or superficial vein  thrombosis.    _____________________________  V. Charlena Cross, MD   AC/MEDQ  D:  01/09/2009  T:  01/09/2009  Job:  [id number]

## 2011-02-23 NOTE — Assessment & Plan Note (Signed)
OFFICE VISIT   [name], [name]  DOB:  [birth year]                                       01/06/2009  ZOXWR#:[id number]   REFERRING PHYSICIAN:  Dr. Doristine Section   PRIMARY CARE PHYSICIAN:  Dr. Bryan Lemma. E[place]   REASON FOR CONSULT:  Inferior vena cava filter.   HISTORY:  This is a 65 year old gentleman who is being seen by Dr. Renae Fickle  for evaluation for right knee replacement.  The patient reports having  developed a pulmonary embolus and DVT which occurred back in November or  December 2009 after a long trip to [place].  He has been treated  with Coumadin which he is still taking.  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  His PE was  associated significant shortness of breath requiring hospitalization.   REVIEW OF SYSTEMS:  GENERAL:  Negative fevers, chills, weight gain,  weight loss.  CARDIAC:  Negative.  PULMONARY:  Shortness of breath with his PE, none currently.  GI:  Negative.  GU:  Negative.  NEURO:  Negative.  ORTHO:  Negative.  PSYCH:  Negative.  ENT:  Negative.  HEM:  Negative.   PAST MEDICAL HISTORY:  Significant for hypertension, carpal tunnel  syndrome, history of hernia surgery, history of prostate cancer status  post prostatectomy, history of left knee replacement.   FAMILY HISTORY:  Positive for coronary disease in his mother at an early  age.   SOCIAL HISTORY:  He is married.  Works as an Art gallery manager.  Does not smoke,  has never smoked.  Currently drinks 2 alcohol drinks per day.   MEDICATIONS:  Include Diovan, Coumadin, amlodipine, Caltrate.   PHYSICAL EXAMINATION:  Blood pressure 9/65, pulse is 69.  General:  Well-  appearing, no distress.  Moderately obese.  Cardiovascular:  Regular  rate and rhythm.  Abdomen:  Soft, nontender.  Respirations are  nonlabored.   ASSESSMENT:  History of deep vein thrombosis and pulmonary embolism.   PLAN:  The patient is due to have his Coumadin  discontinued in May after  completing a 84-month course.  I think placing a perioperative removable  IVC filter would be of benefit in this gentleman.  I have put him on the  schedule for Tuesday, March 25, 2009.  I discussed the risks and benefits  of placing a filter including the inability to retrieve it, IVC  thrombosis.  He understands all this.  Hopefully, he can arrange to have  his knee replacement shortly thereafter so that we can plan on  retrieving his filter 3-4 weeks following his operation.  He will get a  baseline ultrasound today to evaluate his iliofemoral venous system to  ensure that there are no existing clots.   [name] Ny, MD  Electronically Signed   VWB/MEDQ  D:  01/06/2009  T:  01/07/2009  Job:  [id number]   cc:   [name], [name]  [name], [name]

## 2011-02-23 NOTE — Op Note (Signed)
NAMEPRIMITIVO, [name]               ACCOUNT NO.:  [id number]   MEDICAL RECORD NO.:  [id number]          PATIENT TYPE:  INP   LOCATION:  5030                         FACILITY:  [facility]   PHYSICIAN:  Harvie Junior, [name]   DATE OF BIRTH:  May 07, 1946   DATE OF PROCEDURE:  04/23/2009  DATE OF DISCHARGE:                               OPERATIVE REPORT   PREOPERATIVE DIAGNOSIS:  End-stage degenerative joint disease, right  knee.   POSTOPERATIVE DIAGNOSIS:  End-stage degenerative joint disease, right  knee.   PROCEDURE:  1. Right total knee replacement with a Sigma system, size 4 narrow      femur, size 3 tibia, 10-mm bridging bearing, and a 38-mm all poly      patella.  2. Computer-assisted right total knee replacement.   SURGEON:  Harvie Junior, MD   ASSISTANT:  Marshia Ly, PA   ANESTHESIA:  General.   BRIEF HISTORY:  [name] is a 65 year old male with long history of  having had significant bilateral knee pain.  He had a left total knee  done some years ago.  Because of continued complaints of right knee  pain, he was ultimately taken to the operating room for right total knee  replacement.  X-rays showed severe varus malalignment given his young  age and severity of his malalignment, felt the computer assistance would  be appropriate and this was chosen to be using preoperatively.  The  patient was brought to the operating room for this procedure.   PROCEDURE:  The patient was brought to the operating room.  After  adequate anesthesia was obtained with general anesthetic, the patient  was placed supine on the operating table.  The right leg was prepped and  draped in the usual sterile fashion.  Following this, the leg was  exsanguinated with blood pressure tourniquet inflated to 350 mmHg.  Following this, a midline incision was made, subcutaneous tissue was  dissected down to the level of the extensor mechanism and a medial  parapatellar arthrotomy was undertaken  Because of the significant varus malalignment, the MCL was taken down as  well as the posterior medial restraints, ACL, PCL were removed,  retropatellar fat pad and the knee was then exposed.  At this point, the  computer modules were placed, 2 pins in the tibia, 2 pins in the femur,  and the registration process undertaken.  The computer adds about half  an hour to the surgical time.  At this point, the tibia was cut  perpendicular to the long axis under computer control.  The femur was  then cut perpendicular to the anatomic axis under computer control.  Spacer block was put in place and 10 spacer fit at this point.  At this  point, we went to removing the median and lateral meniscus and the  remainders that were in the posterior area.  Attention was turned to the  femur, which was sized to a 4.  The anterior and posterior cuts were  made.  We did push the implant back slightly and then did box cuts and  chamfers.  Once  this was done, it became clear that a 4 narrow is going  to be a better fit than a straight up 4 and at this point, that was  chosen to be used and attention was then turned to the tibial side where  we sized it to a size 3, little bit between 3 and 4, but felt that 3, we  gave a good rim fit that there was fair amount of this was osteophyte  around the edges.  This was then drilled and keeled and trials were then  placed, size 4 narrow femur, 3 tibia, 10-mm bridging bearing, and 38 was  chosen as the patellar paddle after the patella was sized to 21 and cut  down to a level of 14.  Once this was done, the trial patella was put in  place, a range of motion was undertaken.  No rotation of the component  through range of motion, excellent neutral long alignment.  Computer  assistance was checked at this point showed perfect neutral long  alignment with good gap balancing in both flexion and extension.  Once  this was completed, the trial components were removed.  The knee  was  copiously and thoroughly lavaged and suctioned dry, 3 tibia was cemented  into place, 4 narrow femur cemented into place, 10-mm bridging bearing  trial with a loop peg was placed, and a 38-mm all poly patella.  All the  components were cemented into place.  Cement was allowed to harden.  We  did trial a 12.5 at that point just to see, it certainly could have  handled in flexion, but in extension it really just was too tight, mid  range was not an issue and there was no rotation of the bearing at this  point.  The 10 was chosen, the final poly and that was placed.  Tourniquet was let down.  All bleeding was controlled with  electrocautery at this point, and the final poly was placed.  Medium  Hemovac drain was placed.  The medial parapatellar arthrotomy was closed  with a 1 Vicryl running.  Skin with 0 and 2-0 Vicryl and skin staples.  Sterile compressive dressing was applied as well as knee immobilizer.  The patient was taken to recovery room and was noted to be in  satisfactory condition.  Estimated blood loss for procedure was 100 mL.      Harvie Junior, [name]  Electronically Signed     JLG/MEDQ  D:  04/23/2009  T:  04/24/2009  Job:  [id number]

## 2011-02-23 NOTE — Discharge Summary (Signed)
[name], [name]               ACCOUNT NO.:  [id number]   MEDICAL RECORD NO.:  [id number]          PATIENT TYPE:  INP   LOCATION:  5504                         FACILITY:  [facility]   PHYSICIAN:  [name], [name] BenderDATE OF BIRTH:  Aug 15, 1946   DATE OF ADMISSION:  09/07/2008  DATE OF DISCHARGE:                               DISCHARGE SUMMARY   ANTICIPATED DATE OF DISCHARGE:  September 11, 2008.   ATTENDING PHYSICIAN:  [name], [name]   PRIMARY CARE PHYSICIAN:  [name], [name]   DISCHARGE DIAGNOSES:  1. Bilateral pulmonary embolus.  2. Secondary hypoxemia.  3. Hypertension.  4. Obesity.  5. Obstructive sleep apnea.   DISCHARGE MEDICATIONS:  The patient will resume all of his previous  medicines; these are as follows:  1. Diovan/HCTZ 160/25 p.o. daily.  2. Os-Cal p.o. daily.  3. Norvasc 5 p.o. daily.  4. Aleve p.r.n.  New medications:  1. Coumadin 12.5 p.o. q.h.s. at least for the next 2 days until his      INR is therapeutic between 2 and 3.  Then he will decrease down to      10 mg p.o. daily.  His PCP may need to adjust this further.  2. Lovenox 105 mg subcu every 12 hours until his INR is therapeutic.  3. Home O2 continuous.   HOSPITAL COURSE:  The patient is a 64 year old African American male  with past medical history of sleep apnea and hypertension who presented  to the emergency room on September 07, 2008, complaining of some mild  chest pressure.  He previously had 4 days of new onset of dyspnea on  exertion and initially he was in the ER and requested admission for  chest pain.  Labs were ordered.  He was found to be significantly  hypoxic with a PO2 in the 60s and D-dimer that was slightly elevated.  CT scan of the chest showed bilateral PEs and the patient was then  treated for pulmonary embolus.  Started on a Coumadin and Lovenox  protocol per pharmacy.  In discussion per the patient he had been on  some recent car trips to [place] and had not  stopped to take a stretch in  his legs.  Lower extremity Doppler noted a left leg proximal DVT.  The  patient then was instructed on Coumadin and Lovenox which he then  understands.  He is complaining of some significant shortness of breath  and reportedly had some oxygen desaturations on December 1 then began to  ambulate on room air.  The plan will be for the patient to, will check a  documented ambulatory oxygen saturation and if his O2 saturations are  less than 89% we will discharge home on continuous home O2.  His INR has  trended up steadily and as of December 2 his INR is 1.6.  He has been  receiving Coumadin 12.5 for this.  The plan will be for the patient to  be discharged to home and then in 2 days' time he will follow up with  his PCP, [name].  At that time  [name] can check a followup INR  and if it is between 2 and 3 the patient can decrease his Coumadin down  to 10 mg a day and he can, in the interim he will be on Lovenox 105 mg  subcu q.12 hours until his INR is therapeutic.  The patient's overall  disposition has improved.  His  activity will be to increase activity slowly and be cautious while on  Coumadin.  He is cleared to return back to work on December 7.  He is to  be discharged on a low sodium diet and he will follow up and see Dr.  Blair [name] on Friday.  He is being discharged to home.      [name], [name]  Electronically Signed     SKK/MEDQ  D:  09/11/2008  T:  09/11/2008  Job:  [id number]   cc:   [name], [name]

## 2011-02-26 NOTE — Discharge Summary (Signed)
NAMESTATON, MARKEY               ACCOUNT NO.:  [id number]   MEDICAL RECORD NO.:  [id number]          PATIENT TYPE:  INP   LOCATION:  5030                         FACILITY:  [facility]   PHYSICIAN:  Harvie Junior, [name]   DATE OF BIRTH:  1945/12/31   DATE OF ADMISSION:  04/23/2009  DATE OF DISCHARGE:  04/27/2009                               DISCHARGE SUMMARY   ADMITTING DIAGNOSES:  1. End-stage degenerative joint disease, right knee.  2. History of deep vein thrombosis/pulmonary embolus, November 2009.  3. Hypertension.  4. Status post Greenfield filter placement after deep vein      thrombosis/pulmonary embolus.  5. History of sleep apnea   DISCHARGE DIAGNOSES:  1. End-stage degenerative joint disease, right knee.  2. History of deep vein thrombosis/pulmonary embolus, November 2009.  3. Hypertension.  4. Status post Greenfield filter placement after deep vein      thrombosis/pulmonary embolus.  5. History of sleep apnea.  6. Acute blood loss anemia.   PROCEDURES IN THE HOSPITAL:  Left total knee arthroplasty, computer-  assisted, Jodi Geralds MD, April 23, 2009.   HISTORY:  This is a 65 year old male who has a long history of pain in  his right knee.  X-ray showed bone-on-bone degenerative arthritis.  He  had night pain most of the time and had pain with ambulation.  He had no  relief with also conservative treatment including injection, therapy,  modification of activity as well as use of medications.  He did have a  history of having had a deep vein thrombosis and pulmonary embolus in  November 2009, was treated with anticoagulation and Greenfield filter.  He was felt to be medically stable for this procedure and this was  evaluated preoperatively.  At this point, he is here for right total  knee arthroplasty based upon his clinical and radiographic findings and  is admitted for this.   PERTINENT LABORATORY STUDIES:  Hemoglobin on admission was 12.6,  hematocrit 38.2, indices  within normal limits.  On postop day #1, his  hemoglobin was 10.0 and #2 was 9.2, #3 was 8.3. #4 was 8.2.  His protime  on admission was 12.7 seconds and INR of 0.9 and PTT of 30.  On the day  of discharge, his protime was 21.1 seconds and INR of 1.7, on Coumadin  therapy.  CMET was without abnormalities and BUN was followed on 2  postoperative days and felt to be within normal range.  Urinalysis was  unremarkable on admission.   HOSPITAL COURSE:  The patient was brought to the operating room on April 23, 2009, where he underwent right total knee arthroplasty as well, as  described in Dr. Luiz Blare' operative note.  Preoperatively, he was given  80 mg IV of gentamicin and a gram of Ancef.  Postop, he was given 24  hours of IV Ancef.  PCA morphine pump was used for pain control.  Physical therapy was ordered for walker ambulation, weightbearing as  tolerated on the right side.  IV fluids were instituted as well as a  Foley catheter was placed at the time  of surgery.  Coumadin was started  for DVT prophylaxis postoperatively on the night of this surgery.  On  postoperative day #1, the patient overall was doing well.  He had  complaints of right knee pain.  His vital signs were stable.  He was  afebrile.  Hemoglobin was stable at 10.0.  His INR was 1.1.  He got out  of bed with physical therapy.  CPM machine was used.  Coumadin was  already started.  The patient used a CPAP machine. On postop #2, he said  he was doing pretty well.  He was taking fluids and voiding without  difficulty.  He had no chest pain or shortness breath.  His vital signs  were stable and his O2 sats were 87% on room air.  This was just after  he had been off the CPAP for some period of time.  His hemoglobin was  9.2.  BMET was normal.  INR was 1.5.  His right knee wound was benign.  His calf was soft and view was intact distally.  His dressing was  changed.  His Hemovac drain was pulled.  He was continued on subcu   Lovenox which was started 18 hours after the surgery until his INR was  greater than 2.0.  His PCA morphine pump was discontinued and IV was  converted to a saline lock.  On postop day #3, he had no complaints.  He  did not have chest pain or shortness breath.  He was taking fluids or  voiding without difficulty.  He was making progress with physical  therapy.  His hemoglobin was 8.3.  His INR was 1.7.  He was continued on  subcu Lovenox until his INR was greater than 2.  His CBC was continued  to be checked.  On the postop day #4, April 27, 2009, the patient was  doing well, he was ready to home.  He was taking fluids and voiding  without difficulty.  He had a bowel movement.  He was progressing with  physical therapy.  He had a low-grade fever of 100.2 and was then found  to be afebrile.  His vital signs were stable.  His O2 sats were 97% on  room air.  His right knee wound was benign.  His calf was soft and  nontender.  His hemoglobin was 8.2.  His INR was 1.7.  His saline lock  was discontinued.  He was discharged home in improved condition.  He was  started on a regular diet.  Continue on his usual home medications with  addition of Percocet 5 mg p.r.n. pain, Coumadin x1 month for DVT  prophylaxis as directed per Pharmacy, and iron sulfate 325 mg b.i.d. x2  weeks.  He will need home health physical therapy and home health RN for  protime and Coumadin management.  At home, CPM machine is active.  He  will be touchdown weightbearing as tolerated on the right with a walker.  He will follow up with Dr. Luiz Blare in 10 days.      Marshia Ly, P.A.      Harvie Junior, [name]  Electronically Signed    JB/MEDQ  D:  06/04/2009  T:  06/05/2009  Job:  [id number]   cc:   [name], MD

## 2011-03-05 ENCOUNTER — Other Ambulatory Visit: Payer: Self-pay | Admitting: Medical

## 2011-03-05 ENCOUNTER — Encounter (HOSPITAL_BASED_OUTPATIENT_CLINIC_OR_DEPARTMENT_OTHER): Payer: 59 | Admitting: Oncology

## 2011-03-05 DIAGNOSIS — C775 Secondary and unspecified malignant neoplasm of intrapelvic lymph nodes: Secondary | ICD-10-CM

## 2011-03-05 DIAGNOSIS — C61 Malignant neoplasm of prostate: Secondary | ICD-10-CM

## 2011-03-05 LAB — COMPREHENSIVE METABOLIC PANEL
AST: 16 U/L (ref 0–37)
Albumin: 4 g/dL (ref 3.5–5.2)
Alkaline Phosphatase: 51 U/L (ref 39–117)
Glucose, Bld: 128 mg/dL — ABNORMAL HIGH (ref 70–99)
Potassium: 4 mEq/L (ref 3.5–5.3)
Sodium: 140 mEq/L (ref 135–145)
Total Protein: 6.6 g/dL (ref 6.0–8.3)

## 2011-03-05 LAB — CBC WITH DIFFERENTIAL/PLATELET
EOS%: 0.4 % (ref 0.0–7.0)
MCH: 28.8 pg (ref 27.2–33.4)
MCV: 87.8 fL (ref 79.3–98.0)
MONO%: 5.5 % (ref 0.0–14.0)
NEUT#: 7.4 10*3/uL — ABNORMAL HIGH (ref 1.5–6.5)
RBC: 3.91 10*6/uL — ABNORMAL LOW (ref 4.20–5.82)
RDW: 15.6 % — ABNORMAL HIGH (ref 11.0–14.6)

## 2011-04-13 ENCOUNTER — Encounter (HOSPITAL_BASED_OUTPATIENT_CLINIC_OR_DEPARTMENT_OTHER): Payer: 59 | Admitting: Oncology

## 2011-04-13 ENCOUNTER — Other Ambulatory Visit: Payer: Self-pay | Admitting: Oncology

## 2011-04-13 DIAGNOSIS — Z5111 Encounter for antineoplastic chemotherapy: Secondary | ICD-10-CM

## 2011-04-13 DIAGNOSIS — C61 Malignant neoplasm of prostate: Secondary | ICD-10-CM

## 2011-04-13 DIAGNOSIS — C775 Secondary and unspecified malignant neoplasm of intrapelvic lymph nodes: Secondary | ICD-10-CM

## 2011-04-13 DIAGNOSIS — I10 Essential (primary) hypertension: Secondary | ICD-10-CM

## 2011-04-13 LAB — COMPREHENSIVE METABOLIC PANEL
ALT: 20 U/L (ref 0–53)
Alkaline Phosphatase: 50 U/L (ref 39–117)
Sodium: 141 mEq/L (ref 135–145)
Total Bilirubin: 0.3 mg/dL (ref 0.3–1.2)
Total Protein: 6.8 g/dL (ref 6.0–8.3)

## 2011-04-13 LAB — CBC WITH DIFFERENTIAL/PLATELET
BASO%: 0.6 % (ref 0.0–2.0)
LYMPH%: 19.4 % (ref 14.0–49.0)
MCH: 28.8 pg (ref 27.2–33.4)
MCHC: 33.2 g/dL (ref 32.0–36.0)
MCV: 86.9 fL (ref 79.3–98.0)
MONO%: 4.5 % (ref 0.0–14.0)
Platelets: 208 10*3/uL (ref 140–400)
RBC: 3.92 10*6/uL — ABNORMAL LOW (ref 4.20–5.82)

## 2011-05-25 ENCOUNTER — Encounter (HOSPITAL_BASED_OUTPATIENT_CLINIC_OR_DEPARTMENT_OTHER): Payer: 59 | Admitting: Oncology

## 2011-05-25 ENCOUNTER — Other Ambulatory Visit: Payer: Self-pay | Admitting: Medical

## 2011-05-25 DIAGNOSIS — I10 Essential (primary) hypertension: Secondary | ICD-10-CM

## 2011-05-25 DIAGNOSIS — C775 Secondary and unspecified malignant neoplasm of intrapelvic lymph nodes: Secondary | ICD-10-CM

## 2011-05-25 DIAGNOSIS — C61 Malignant neoplasm of prostate: Secondary | ICD-10-CM

## 2011-05-25 LAB — CBC WITH DIFFERENTIAL/PLATELET
BASO%: 0.2 % (ref 0.0–2.0)
EOS%: 1.3 % (ref 0.0–7.0)
MCH: 29 pg (ref 27.2–33.4)
MCHC: 33.4 g/dL (ref 32.0–36.0)
MCV: 86.9 fL (ref 79.3–98.0)
MONO%: 3.5 % (ref 0.0–14.0)
RDW: 15.7 % — ABNORMAL HIGH (ref 11.0–14.6)
lymph#: 1.9 10*3/uL (ref 0.9–3.3)

## 2011-05-25 LAB — COMPREHENSIVE METABOLIC PANEL
ALT: 20 U/L (ref 0–53)
Alkaline Phosphatase: 46 U/L (ref 39–117)
CO2: 23 mEq/L (ref 19–32)
Potassium: 3.7 mEq/L (ref 3.5–5.3)
Sodium: 137 mEq/L (ref 135–145)
Total Bilirubin: 0.3 mg/dL (ref 0.3–1.2)
Total Protein: 6.9 g/dL (ref 6.0–8.3)

## 2011-07-13 LAB — PROTIME-INR
INR: 1.1 (ref 0.00–1.49)
INR: 1.2 (ref 0.00–1.49)
Prothrombin Time: 14 seconds (ref 11.6–15.2)
Prothrombin Time: 16 seconds — ABNORMAL HIGH (ref 11.6–15.2)

## 2011-07-13 LAB — CBC
HCT: 38 % — ABNORMAL LOW (ref 39.0–52.0)
Hemoglobin: 12.4 g/dL — ABNORMAL LOW (ref 13.0–17.0)
Hemoglobin: 12.4 g/dL — ABNORMAL LOW (ref 13.0–17.0)
MCHC: 32.1 g/dL (ref 30.0–36.0)
MCV: 84.9 fL (ref 78.0–100.0)
Platelets: 199 10*3/uL (ref 150–400)
RBC: 4.52 MIL/uL (ref 4.22–5.81)
RDW: 15.6 % — ABNORMAL HIGH (ref 11.5–15.5)

## 2011-07-13 LAB — URINALYSIS, ROUTINE W REFLEX MICROSCOPIC
Bilirubin Urine: NEGATIVE
Nitrite: NEGATIVE
Protein, ur: NEGATIVE mg/dL
Specific Gravity, Urine: 1.019 (ref 1.005–1.030)
Urobilinogen, UA: 1 mg/dL (ref 0.0–1.0)

## 2011-07-13 LAB — POCT CARDIAC MARKERS
CKMB, poc: 1.9 ng/mL (ref 1.0–8.0)
Myoglobin, poc: 61 ng/mL (ref 12–200)
Troponin i, poc: <0.05 ng/mL (ref 0.00–0.09)

## 2011-07-13 LAB — BASIC METABOLIC PANEL
CO2: 25 mEq/L (ref 19–32)
Calcium: 9.4 mg/dL (ref 8.4–10.5)
Creatinine, Ser: 0.97 mg/dL (ref 0.4–1.5)
GFR calc Af Amer: >60 mL/min (ref >60)
GFR calc non Af Amer: >60 mL/min (ref >60)
Glucose, Bld: 102 mg/dL — ABNORMAL HIGH (ref 70–99)
Sodium: 141 mEq/L (ref 135–145)

## 2011-07-13 LAB — CARDIAC PANEL(CRET KIN+CKTOT+MB+TROPI)
CK, MB: 4.1 ng/mL — ABNORMAL HIGH (ref 0.3–4.0)
Relative Index: 1.8 (ref 0.0–2.5)
Troponin I: 0.03 ng/mL (ref 0.00–0.06)

## 2011-07-13 LAB — POCT I-STAT 3, ART BLOOD GAS (G3+): pH, Arterial: 7.491 — ABNORMAL HIGH (ref 7.350–7.450)

## 2011-07-13 LAB — DIFFERENTIAL
Eosinophils Absolute: 0.3 10*3/uL (ref 0.0–0.7)
Eosinophils Relative: 3 % (ref 0–5)
Lymphs Abs: 2.1 10*3/uL (ref 0.7–4.0)
Monocytes Relative: 6 % (ref 3–12)

## 2011-07-13 LAB — D-DIMER, QUANTITATIVE: D-Dimer, Quant: 1.5 ug/mL-FEU — ABNORMAL HIGH (ref 0.00–0.48)

## 2011-07-14 ENCOUNTER — Other Ambulatory Visit: Payer: Self-pay | Admitting: Oncology

## 2011-07-14 ENCOUNTER — Encounter (HOSPITAL_BASED_OUTPATIENT_CLINIC_OR_DEPARTMENT_OTHER): Payer: Medicare Other | Admitting: Oncology

## 2011-07-14 DIAGNOSIS — C775 Secondary and unspecified malignant neoplasm of intrapelvic lymph nodes: Secondary | ICD-10-CM

## 2011-07-14 DIAGNOSIS — I10 Essential (primary) hypertension: Secondary | ICD-10-CM

## 2011-07-14 DIAGNOSIS — Z5111 Encounter for antineoplastic chemotherapy: Secondary | ICD-10-CM

## 2011-07-14 DIAGNOSIS — C61 Malignant neoplasm of prostate: Secondary | ICD-10-CM

## 2011-07-14 LAB — CBC WITH DIFFERENTIAL/PLATELET
Basophils Absolute: 0 10*3/uL (ref 0.0–0.1)
Eosinophils Absolute: 0.1 10*3/uL (ref 0.0–0.5)
HCT: 34.5 % — ABNORMAL LOW (ref 38.4–49.9)
HGB: 11.3 g/dL — ABNORMAL LOW (ref 13.0–17.1)
MCH: 28.7 pg (ref 27.2–33.4)
MONO#: 0.5 10*3/uL (ref 0.1–0.9)
NEUT#: 7.8 10*3/uL — ABNORMAL HIGH (ref 1.5–6.5)
NEUT%: 80.4 % — ABNORMAL HIGH (ref 39.0–75.0)
RDW: 15.2 % — ABNORMAL HIGH (ref 11.0–14.6)
lymph#: 1.3 10*3/uL (ref 0.9–3.3)

## 2011-07-14 LAB — COMPREHENSIVE METABOLIC PANEL
AST: 17 U/L (ref 0–37)
Albumin: 3.8 g/dL (ref 3.5–5.2)
BUN: 18 mg/dL (ref 6–23)
CO2: 27 mEq/L (ref 19–32)
Calcium: 9.1 mg/dL (ref 8.4–10.5)
Chloride: 100 mEq/L (ref 96–112)
Creatinine, Ser: 1.17 mg/dL (ref 0.50–1.35)
Glucose, Bld: 118 mg/dL — ABNORMAL HIGH (ref 70–99)
Potassium: 3.9 mEq/L (ref 3.5–5.3)

## 2011-07-14 LAB — PSA: PSA: 48.4 ng/mL — ABNORMAL HIGH (ref <=4.00)

## 2011-07-16 LAB — CBC
HCT: 37.4 % — ABNORMAL LOW (ref 39.0–52.0)
Hemoglobin: 12.2 g/dL — ABNORMAL LOW (ref 13.0–17.0)
MCHC: 32.6 g/dL (ref 30.0–36.0)
MCV: 84.2 fL (ref 78.0–100.0)
Platelets: 215 K/uL (ref 150–400)
RBC: 4.44 MIL/uL (ref 4.22–5.81)
RDW: 16 % — ABNORMAL HIGH (ref 11.5–15.5)
WBC: 8 K/uL (ref 4.0–10.5)

## 2011-07-16 LAB — BASIC METABOLIC PANEL
BUN: 14 mg/dL (ref 6–23)
CO2: 25 mEq/L (ref 19–32)
Calcium: 8.9 mg/dL (ref 8.4–10.5)
Chloride: 109 mEq/L (ref 96–112)
GFR calc Af Amer: >60 mL/min (ref >60)
GFR calc Af Amer: >60 mL/min (ref >60)
GFR calc non Af Amer: >60 mL/min (ref >60)
GFR calc non Af Amer: >60 mL/min (ref >60)
Potassium: 3.3 mEq/L — ABNORMAL LOW (ref 3.5–5.1)
Potassium: 3.8 mEq/L (ref 3.5–5.1)
Sodium: 140 mEq/L (ref 135–145)
Sodium: 144 mEq/L (ref 135–145)

## 2011-07-16 LAB — PROTIME-INR
INR: 1.4 (ref 0.00–1.49)
INR: 1.6 — ABNORMAL HIGH (ref 0.00–1.49)
Prothrombin Time: 18.2 s — ABNORMAL HIGH (ref 11.6–15.2)
Prothrombin Time: 20 s — ABNORMAL HIGH (ref 11.6–15.2)

## 2011-07-21 ENCOUNTER — Encounter: Payer: Self-pay | Admitting: *Deleted

## 2011-08-18 ENCOUNTER — Encounter: Payer: Self-pay | Admitting: *Deleted

## 2011-08-26 ENCOUNTER — Other Ambulatory Visit: Payer: Self-pay | Admitting: Oncology

## 2011-08-26 ENCOUNTER — Ambulatory Visit (HOSPITAL_BASED_OUTPATIENT_CLINIC_OR_DEPARTMENT_OTHER): Payer: Medicare Other | Admitting: Oncology

## 2011-08-26 ENCOUNTER — Encounter: Payer: Self-pay | Admitting: *Deleted

## 2011-08-26 ENCOUNTER — Other Ambulatory Visit (HOSPITAL_BASED_OUTPATIENT_CLINIC_OR_DEPARTMENT_OTHER): Payer: Medicare Other | Admitting: Lab

## 2011-08-26 ENCOUNTER — Telehealth: Payer: Self-pay | Admitting: Oncology

## 2011-08-26 VITALS — BP 125/72 | HR 76 | Temp 97.7°F | Ht 66.0 in | Wt 244.5 lb

## 2011-08-26 DIAGNOSIS — Z5111 Encounter for antineoplastic chemotherapy: Secondary | ICD-10-CM

## 2011-08-26 DIAGNOSIS — C61 Malignant neoplasm of prostate: Secondary | ICD-10-CM

## 2011-08-26 DIAGNOSIS — I10 Essential (primary) hypertension: Secondary | ICD-10-CM

## 2011-08-26 DIAGNOSIS — C775 Secondary and unspecified malignant neoplasm of intrapelvic lymph nodes: Secondary | ICD-10-CM

## 2011-08-26 LAB — COMPREHENSIVE METABOLIC PANEL
AST: 15 U/L (ref 0–37)
Albumin: 4.1 g/dL (ref 3.5–5.2)
BUN: 15 mg/dL (ref 6–23)
CO2: 27 mEq/L (ref 19–32)
Calcium: 9.2 mg/dL (ref 8.4–10.5)
Chloride: 104 mEq/L (ref 96–112)
Glucose, Bld: 115 mg/dL — ABNORMAL HIGH (ref 70–99)
Potassium: 3.9 mEq/L (ref 3.5–5.3)

## 2011-08-26 LAB — CBC WITH DIFFERENTIAL/PLATELET
BASO%: 0.8 % (ref 0.0–2.0)
EOS%: 1.2 % (ref 0.0–7.0)
HCT: 34.3 % — ABNORMAL LOW (ref 38.4–49.9)
LYMPH%: 21.4 % (ref 14.0–49.0)
MCH: 29.1 pg (ref 27.2–33.4)
MCHC: 33.5 g/dL (ref 32.0–36.0)
MONO%: 7.7 % (ref 0.0–14.0)
NEUT%: 68.9 % (ref 39.0–75.0)
Platelets: 253 10*3/uL (ref 140–400)

## 2011-08-26 MED ORDER — LEUPROLIDE ACETATE (4 MONTH) 30 MG IM KIT
30.0000 mg | PACK | Freq: Once | INTRAMUSCULAR | Status: AC
  Administered 2011-08-26: 30 mg via INTRAMUSCULAR
  Filled 2011-08-26: qty 30

## 2011-08-26 NOTE — Telephone Encounter (Signed)
gve the pt his jan 2013 appt calendar °

## 2011-08-26 NOTE — Progress Notes (Signed)
Hematology and Oncology Follow Up Visit  [name] [id number] 1946/05/10 65 y.o. 08/26/2011 9:24 AM   [name], [name]  [name], [name]  Principle Diagnosis:This is a 65 year old gentleman with prostate cancer initially diagnosed in 2001.  He had a Gleason score of 3 + 3 = 6.  He has castration-resistant disease with pelvic adenopathy.     Prior Therapy:  1. Status post prostatectomy done in May 2001.  He had T3 N1 disease.  PSA nadir to 0.  2. The patient developed biochemical relapse and received prostatic bed radiation.  3. The patient received Lupron with Casodex due to rising PSA.  The patient subsequently developed castration-resistant disease. 4. Patient treated with Casodex withdrawal and subsequently PSA rose to 18. 5. Patient with Provenge immunotherapy completed in July 2011.    Current therapy: 1. He is on ketoconazole and prednisone since December 2011, his PSA down from 154 to 41.   2. He is on Lupron 30 mg every 4 months. He is due for an injection on 11/15.  Interim History: [name] presents today for a followup visit.  He has continued to tolerate ketoconazole and prednisone very well without any significant toxicity.  He is not reporting any nausea.  He is not reporting any vomiting.  Had not reported any abdominal pain or back pain.  As a matter of fact, he is reporting some improvement in his nail fungus which could be expected given the ketoconazole properties.  He had not had any other GI toxicities at this time.  Overall, his performance status and activity levels have not changed.  He continues to perform activities of daily living without any hindrance or decline.  He had not reported any back pain.  Had not reported any genitourinary complaints.  His overall performance status and activity levels remain unchanged.He did report mild lower extremity edema.   Medications: I have reviewed the patient's current medications. Current  outpatient prescriptions:amLODipine (NORVASC) 10 MG tablet, Take 10 mg by mouth daily.  , Disp: , Rfl: ;  aspirin 81 MG tablet, Take 81 mg by mouth daily.  , Disp: , Rfl: ;  Calcium Carbonate-Vitamin D (CALCIUM + D PO), Take by mouth daily.  , Disp: , Rfl: ;  docusate-casanthranol (PERICOLACE) 100-30 MG per capsule, Take 1 capsule by mouth daily as needed.  , Disp: , Rfl:  ketoconazole (NIZORAL) 200 MG tablet, Take 200 mg by mouth 3 (three) times daily.  , Disp: , Rfl: ;  leuprolide (LUPRON) 30 MG injection, Inject 30 mg into the muscle every 4 (four) months.  , Disp: , Rfl: ;  losartan-hydrochlorothiazide (HYZAAR) 100-12.5 MG per tablet, Take 1 tablet by mouth daily.  , Disp: , Rfl: ;  predniSONE (DELTASONE) 5 MG tablet, Take 5 mg by mouth 2 (two) times daily.  , Disp: , Rfl:  Current facility-administered medications:leuprolide (LUPRON) injection 30 mg, 30 mg, Intramuscular, Once, Eli Hose, MD  Allergies: Allergies no known allergies  Past Medical History, Surgical history, Social history, and Family History were reviewed and updated.  Review of Systems: Constitutional:  Negative for fever, chills, night sweats, anorexia, weight loss, pain. Cardiovascular: no chest pain or dyspnea on exertion Respiratory: no cough, shortness of breath, or wheezing Neurological: no TIA or stroke symptoms Dermatological: negative ENT: negative Skin Gastrointestinal: no abdominal pain, change in bowel habits, or black or bloody stools Genito-Urinary: no dysuria, trouble voiding, or hematuria Hematological and Lymphatic: negative  Musculoskeletal: negative Remaining ROS negative. Physical  Exam: Blood pressure 125/72, pulse 76, temperature 97.7 F (36.5 C), height 5\' 6"  (1.676 m), weight 244 lb 8 oz (110.904 kg). ECOG: 1 General appearance: alert Head: Normocephalic, without obvious abnormality, atraumatic Neck: no adenopathy, no carotid bruit, no JVD, supple, symmetrical, trachea midline and thyroid not  enlarged, symmetric, no tenderness/mass/nodules Lymph nodes: Cervical, supraclavicular, and axillary nodes normal. Heart:regular rate and rhythm, S1, S2 normal, no murmur, click, rub or gallop Lung:chest clear, no wheezing, rales, normal symmetric air entry, Heart exam - S1, S2 normal, no murmur, no gallop, rate regular Abdomin: soft, non-tender, without masses or organomegaly EXT:1+ edema noted.   Lab Results: Lab Results  Component Value Date   WBC 8.8 08/26/2011   HGB 11.5* 08/26/2011   HCT 34.3* 08/26/2011   MCV 86.9 08/26/2011   PLT 253 08/26/2011     Chemistry      Component Value Date/Time   NA 137 07/14/2011 1509   K 3.9 07/14/2011 1509   CL 100 07/14/2011 1509   CO2 27 07/14/2011 1509   BUN 18 07/14/2011 1509   CREATININE 1.17 07/14/2011 1509      Component Value Date/Time   CALCIUM 9.1 07/14/2011 1509   ALKPHOS 54 07/14/2011 1509   AST 17 07/14/2011 1509   ALT 23 07/14/2011 1509   BILITOT 0.2* 07/14/2011 1509     PSA 48 (down from 68 on 5/25, upfrom 43 on 8/14)  Impression and Plan:   This is a 65 year old gentleman with the following issues: 1. 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  His disease seems to be under control.  As a matter of fact, his PSA is improving after he has been on ketoconazole approaching now 12 months.  He has good tolerance to the medication, and the plan is to continue him on the current regimen as long as he can tolerate it without any toxicity and continues to have a PSA response. 2. Hormonal deprivation.  He continues to be on Lupron, next dose will be given today. 3. Hypertension seems to be under good control.   4. Followup will be in about 6-8  weeks.    [facility], MD 11/15/20129:24 AM

## 2011-08-29 ENCOUNTER — Other Ambulatory Visit: Payer: Self-pay | Admitting: Oncology

## 2011-10-21 ENCOUNTER — Telehealth: Payer: Self-pay | Admitting: Oncology

## 2011-10-21 ENCOUNTER — Ambulatory Visit (HOSPITAL_BASED_OUTPATIENT_CLINIC_OR_DEPARTMENT_OTHER): Payer: Medicare Other | Admitting: Oncology

## 2011-10-21 ENCOUNTER — Other Ambulatory Visit: Payer: Medicare Other | Admitting: Lab

## 2011-10-21 VITALS — BP 112/71 | HR 69 | Temp 98.3°F | Ht 65.0 in | Wt 251.2 lb

## 2011-10-21 DIAGNOSIS — C61 Malignant neoplasm of prostate: Secondary | ICD-10-CM

## 2011-10-21 DIAGNOSIS — I10 Essential (primary) hypertension: Secondary | ICD-10-CM

## 2011-10-21 LAB — BASIC METABOLIC PANEL
Calcium: 8.9 mg/dL (ref 8.4–10.5)
Chloride: 105 mEq/L (ref 96–112)
Creatinine, Ser: 1.21 mg/dL (ref 0.50–1.35)
Sodium: 141 mEq/L (ref 135–145)

## 2011-10-21 LAB — CBC WITH DIFFERENTIAL/PLATELET
BASO%: 0.3 % (ref 0.0–2.0)
EOS%: 0.7 % (ref 0.0–7.0)
HCT: 34.6 % — ABNORMAL LOW (ref 38.4–49.9)
MCH: 27.9 pg (ref 27.2–33.4)
MCHC: 32.2 g/dL (ref 32.0–36.0)
NEUT%: 74.2 % (ref 39.0–75.0)
lymph#: 1.6 10*3/uL (ref 0.9–3.3)

## 2011-10-21 LAB — PSA: PSA: 42.26 ng/mL — ABNORMAL HIGH (ref <=4.00)

## 2011-10-21 NOTE — Progress Notes (Signed)
Hematology and Oncology Follow Up Visit  [name] [id number] May 28, 1946 66 y.o. 10/21/2011 3:27 PM   [name], [name]  [name], [name]  Principle Diagnosis:This is a 66 year old gentleman with prostate cancer initially diagnosed in 2001.  He had a Gleason score of 3 + 3 = 6.  He has castration-resistant disease with pelvic adenopathy.     Prior Therapy:  1. Status post prostatectomy done in May 2001.  He had T3 N1 disease.  PSA nadir to 0.  2. The patient developed biochemical relapse and received prostatic bed radiation.  3. The patient received Lupron with Casodex due to rising PSA.  The patient subsequently developed castration-resistant disease. 4. Patient treated with Casodex withdrawal and subsequently PSA rose to 18. 5. Patient with Provenge immunotherapy completed in July 2011.    Current therapy: 1. He is on ketoconazole and prednisone since December 2011, his PSA down from 154 to 41. Most recent PSA was 43 (08/26/2011)   2. He is on Lupron 30 mg every 4 months. He is due for an injection on 12/24/2011.  Interim History: [name] presents today for a followup visit.  He has continued to tolerate ketoconazole and prednisone very well without any significant toxicity.  He is not reporting any nausea.  He is not reporting any vomiting.  Had not reported any abdominal pain or back pain.  He had not had any other GI toxicities at this time.  Overall, his performance status and activity levels have not changed.  He continues to perform activities of daily living without any hindrance or decline.  He had not reported any back pain.  Had not reported any genitourinary complaints.  His overall performance status and activity levels remain unchanged.He did report mild lower extremity edema.   Medications: I have reviewed the patient's current medications. Current outpatient prescriptions:amLODipine (NORVASC) 10 MG tablet, Take 10 mg by mouth daily.  , Disp: ,  Rfl: ;  aspirin 81 MG tablet, Take 81 mg by mouth daily.  , Disp: , Rfl: ;  Calcium Carbonate-Vitamin D (CALCIUM + D PO), Take by mouth daily.  , Disp: , Rfl: ;  docusate-casanthranol (PERICOLACE) 100-30 MG per capsule, Take 1 capsule by mouth daily as needed.  , Disp: , Rfl:  ketoconazole (NIZORAL) 200 MG tablet, Take 200 mg by mouth 3 (three) times daily. , Disp: , Rfl: ;  leuprolide (LUPRON) 30 MG injection, Inject 30 mg into the muscle every 4 (four) months.  , Disp: , Rfl: ;  losartan-hydrochlorothiazide (HYZAAR) 100-12.5 MG per tablet, Take 1 tablet by mouth daily.  , Disp: , Rfl: ;  predniSONE (DELTASONE) 5 MG tablet, TAKE 1 TABLET TWICE A DAY, Disp: 180 tablet, Rfl: 1  Allergies: Allergies no known allergies  Past Medical History, Surgical history, Social history, and Family History were reviewed and updated.  Review of Systems: Constitutional:  Negative for fever, chills, night sweats, anorexia, weight loss, pain. Cardiovascular: no chest pain or dyspnea on exertion Respiratory: no cough, shortness of breath, or wheezing Neurological: no TIA or stroke symptoms Dermatological: negative ENT: negative Skin Gastrointestinal: no abdominal pain, change in bowel habits, or black or bloody stools Genito-Urinary: no dysuria, trouble voiding, or hematuria Hematological and Lymphatic: negative  Musculoskeletal: negative Remaining ROS negative. Physical Exam: Blood pressure 112/71, pulse 69, temperature 98.3 F (36.8 C), temperature source Oral, height 5\' 5"  (1.651 m), weight 251 lb 3.2 oz (113.944 kg). ECOG: 1 General appearance: alert Head: Normocephalic, without obvious abnormality,  atraumatic Neck: no adenopathy, no carotid bruit, no JVD, supple, symmetrical, trachea midline and thyroid not enlarged, symmetric, no tenderness/mass/nodules Lymph nodes: Cervical, supraclavicular, and axillary nodes normal. Heart:regular rate and rhythm, S1, S2 normal, no murmur, click, rub or  gallop Lung:chest clear, no wheezing, rales, normal symmetric air entry, Heart exam - S1, S2 normal, no murmur, no gallop, rate regular Abdomin: soft, non-tender, without masses or organomegaly EXT:1+ edema noted.   Lab Results: Lab Results  Component Value Date   WBC 9.1 10/21/2011   HGB 11.1* 10/21/2011   HCT 34.6* 10/21/2011   MCV 86.7 10/21/2011   PLT 235 10/21/2011     Chemistry      Component Value Date/Time   NA 141 08/26/2011 0849   NA 141 08/26/2011 0849   K 3.9 08/26/2011 0849   K 3.9 08/26/2011 0849   CL 104 08/26/2011 0849   CL 104 08/26/2011 0849   CO2 27 08/26/2011 0849   CO2 27 08/26/2011 0849   BUN 15 08/26/2011 0849   BUN 15 08/26/2011 0849   CREATININE 1.14 08/26/2011 0849   CREATININE 1.14 08/26/2011 0849      Component Value Date/Time   CALCIUM 9.2 08/26/2011 0849   CALCIUM 9.2 08/26/2011 0849   ALKPHOS 48 08/26/2011 0849   ALKPHOS 48 08/26/2011 0849   AST 15 08/26/2011 0849   AST 15 08/26/2011 0849   ALT 16 08/26/2011 0849   ALT 16 08/26/2011 0849   BILITOT 0.3 08/26/2011 0849   BILITOT 0.3 08/26/2011 0849     PSA 43 (down from 68 on 5/25, upfrom 43 on 8/14)  Impression and Plan:   This is a 66 year old gentleman with the following issues: 1. 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  His disease seems to be under control.  As a matter of fact, his PSA is improving after he has been on ketoconazole approaching more than 12 months.  He has good tolerance to the medication, and the plan is to continue him on the current regimen as long as he can tolerate it without any toxicity and continues to have a PSA response. 2. Hormonal deprivation.  He continues to be on Lupron, next dose will be given with the next dose. 3. Hypertension seems to be under good control.   4. Followup will be in 8  weeks.    [name] Hose, MD 1/10/20133:27 PM

## 2011-10-21 NOTE — Telephone Encounter (Signed)
appt made and card given to pt for 3/15 appt    aom

## 2011-11-01 ENCOUNTER — Other Ambulatory Visit: Payer: Self-pay | Admitting: Oncology

## 2011-11-01 DIAGNOSIS — C61 Malignant neoplasm of prostate: Secondary | ICD-10-CM

## 2011-11-12 ENCOUNTER — Other Ambulatory Visit: Payer: Self-pay | Admitting: *Deleted

## 2011-11-12 DIAGNOSIS — C61 Malignant neoplasm of prostate: Secondary | ICD-10-CM

## 2011-11-12 MED ORDER — KETOCONAZOLE 200 MG PO TABS: 200.0000 mg | ORAL_TABLET | Freq: Three times a day (TID) | ORAL | Status: DC

## 2011-12-16 ENCOUNTER — Encounter: Payer: Self-pay | Admitting: Gastroenterology

## 2011-12-24 ENCOUNTER — Other Ambulatory Visit (HOSPITAL_BASED_OUTPATIENT_CLINIC_OR_DEPARTMENT_OTHER): Payer: Medicare Other

## 2011-12-24 ENCOUNTER — Ambulatory Visit (HOSPITAL_BASED_OUTPATIENT_CLINIC_OR_DEPARTMENT_OTHER): Payer: Medicare Other | Admitting: Oncology

## 2011-12-24 VITALS — BP 116/66 | HR 76 | Temp 99.2°F | Ht 65.0 in | Wt 250.4 lb

## 2011-12-24 DIAGNOSIS — I10 Essential (primary) hypertension: Secondary | ICD-10-CM

## 2011-12-24 DIAGNOSIS — C61 Malignant neoplasm of prostate: Secondary | ICD-10-CM

## 2011-12-24 LAB — CBC WITH DIFFERENTIAL/PLATELET
Basophils Absolute: 0 10*3/uL (ref 0.0–0.1)
Eosinophils Absolute: 0.1 10*3/uL (ref 0.0–0.5)
HGB: 11.5 g/dL — ABNORMAL LOW (ref 13.0–17.1)
NEUT#: 7 10*3/uL — ABNORMAL HIGH (ref 1.5–6.5)
RDW: 15.8 % — ABNORMAL HIGH (ref 11.0–14.6)
lymph#: 1.5 10*3/uL (ref 0.9–3.3)

## 2011-12-24 LAB — COMPREHENSIVE METABOLIC PANEL
Albumin: 3.8 g/dL (ref 3.5–5.2)
BUN: 17 mg/dL (ref 6–23)
CO2: 25 mEq/L (ref 19–32)
Calcium: 9.2 mg/dL (ref 8.4–10.5)
Chloride: 102 mEq/L (ref 96–112)
Glucose, Bld: 170 mg/dL — ABNORMAL HIGH (ref 70–99)
Potassium: 3.8 mEq/L (ref 3.5–5.3)

## 2011-12-24 MED ORDER — LEUPROLIDE ACETATE (4 MONTH) 30 MG IM KIT
30.0000 mg | PACK | Freq: Once | INTRAMUSCULAR | Status: AC
  Administered 2011-12-24: 30 mg via INTRAMUSCULAR
  Filled 2011-12-24: qty 30

## 2011-12-24 NOTE — Progress Notes (Signed)
Hematology and Oncology Follow Up Visit  [name] [id number] 06/19/1946 66 y.o. 12/24/2011 3:50 PM   [name], [name]  [name], [name]  Principle Diagnosis:This is a 66 year old gentleman with prostate cancer initially diagnosed in 2001.  He had a Gleason score of 3 + 3 = 6.  He has castration-resistant disease with pelvic adenopathy.     Prior Therapy:  1. Status post prostatectomy done in May 2001.  He had T3 N1 disease.  PSA nadir to 0.  2. The patient developed biochemical relapse and received prostatic bed radiation.  3. The patient received Lupron with Casodex due to rising PSA.  The patient subsequently developed castration-resistant disease. 4. Patient treated with Casodex withdrawal and subsequently PSA rose to 18. 5. Patient with Provenge immunotherapy completed in July 2011.    Current therapy: 1. He is on ketoconazole and prednisone since December 2011, his PSA down from 154 to 41. Most recent PSA was 43 (08/26/2011)   2. He is on Lupron 30 mg every 4 months. He is due for an injection on 12/24/2011.  Interim History: [name] presents today for a followup visit.  He has continued to tolerate ketoconazole and prednisone very well without any significant toxicity.  He is not reporting any nausea.  He is not reporting any vomiting.  Had not reported any abdominal pain or back pain.  He had not had any other GI toxicities at this time.  Overall, his performance status and activity levels have not changed.  He continues to perform activities of daily living without any hindrance or decline.  He had not reported any back pain.  Had not reported any genitourinary complaints.  His overall performance status and activity levels remain unchanged.He did report mild lower extremity edema. No new complications noted since the last visit.    Medications: I have reviewed the patient's current medications. Current outpatient prescriptions:amLODipine (NORVASC) 10 MG  tablet, Take 10 mg by mouth daily.  , Disp: , Rfl: ;  aspirin 81 MG tablet, Take 81 mg by mouth daily.  , Disp: , Rfl: ;  Calcium Carbonate-Vitamin D (CALCIUM + D PO), Take by mouth daily.  , Disp: , Rfl: ;  docusate-casanthranol (PERICOLACE) 100-30 MG per capsule, Take 1 capsule by mouth daily as needed.  , Disp: , Rfl:  ketoconazole (NIZORAL) 200 MG tablet, Take 1 tablet (200 mg total) by mouth 3 (three) times daily., Disp: 270 tablet, Rfl: 1;  leuprolide (LUPRON) 30 MG injection, Inject 30 mg into the muscle every 4 (four) months.  , Disp: , Rfl: ;  losartan-hydrochlorothiazide (HYZAAR) 100-12.5 MG per tablet, Take 1 tablet by mouth daily.  , Disp: , Rfl: ;  predniSONE (DELTASONE) 5 MG tablet, TAKE 1 TABLET TWICE A DAY, Disp: 180 tablet, Rfl: 1 DISCONTD: ketoconazole (NIZORAL) 200 MG tablet, Take 200 mg by mouth 3 (three) times daily.  , Disp: , Rfl:  Current facility-administered medications:leuprolide (LUPRON) injection 30 mg, 30 mg, Intramuscular, Once, Benjiman Core, MD  Allergies: Allergies no known allergies  Past Medical History, Surgical history, Social history, and Family History were reviewed and updated.  Review of Systems: Constitutional:  Negative for fever, chills, night sweats, anorexia, weight loss, pain. Cardiovascular: no chest pain or dyspnea on exertion Respiratory: no cough, shortness of breath, or wheezing Neurological: no TIA or stroke symptoms Dermatological: negative ENT: negative Skin Gastrointestinal: no abdominal pain, change in bowel habits, or black or bloody stools Genito-Urinary: no dysuria, trouble voiding, or  hematuria Hematological and Lymphatic: negative  Musculoskeletal: negative Remaining ROS negative. Physical Exam: Blood pressure 116/66, pulse 76, temperature 99.2 F (37.3 C), temperature source Oral, height 5\' 5"  (1.651 m), weight 250 lb 6.4 oz (113.581 kg). ECOG: 1 General appearance: alert Head: Normocephalic, without obvious abnormality,  atraumatic Neck: no adenopathy, no carotid bruit, no JVD, supple, symmetrical, trachea midline and thyroid not enlarged, symmetric, no tenderness/mass/nodules Lymph nodes: Cervical, supraclavicular, and axillary nodes normal. Heart:regular rate and rhythm, S1, S2 normal, no murmur, click, rub or gallop Lung:chest clear, no wheezing, rales, normal symmetric air entry, Heart exam - S1, S2 normal, no murmur, no gallop, rate regular Abdomin: soft, non-tender, without masses or organomegaly EXT:1+ edema noted.   Lab Results: Lab Results  Component Value Date   WBC 9.0 12/24/2011   HGB 11.5* 12/24/2011   HCT 35.9* 12/24/2011   MCV 85.7 12/24/2011   PLT 235 12/24/2011     Chemistry      Component Value Date/Time   NA 141 10/21/2011 1444   K 3.9 10/21/2011 1444   CL 105 10/21/2011 1444   CO2 28 10/21/2011 1444   BUN 17 10/21/2011 1444   CREATININE 1.21 10/21/2011 1444      Component Value Date/Time   CALCIUM 8.9 10/21/2011 1444   ALKPHOS 48 08/26/2011 0849   ALKPHOS 48 08/26/2011 0849   AST 15 08/26/2011 0849   AST 15 08/26/2011 0849   ALT 16 08/26/2011 0849   ALT 16 08/26/2011 0849   BILITOT 0.3 08/26/2011 0849   BILITOT 0.3 08/26/2011 0849     Results for [name] (MRN [id number]) as of 12/24/2011 15:53  Ref. Range 08/26/2011 08:49 10/21/2011 14:44  PSA Latest Range: <=4.00 ng/mL 43.29 (H) 42.26 (H)    Impression and Plan:   This is a 66 year old gentleman with the following issues: 1. 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  His disease seems to be under control.  As a matter of fact, his PSA is improving after he has been on ketoconazole approaching more than 15 months.  He has good tolerance to the medication, and the plan is to continue him on the current regimen as long as he can tolerate it without any toxicity and continues to have a PSA response. 2. Hormonal deprivation.  He continues to be on Lupron, next dose will be given today.   3. Hypertension seems to be under good control.   4. Followup will be in 8  weeks.    Eli Hose, MD 3/15/20133:50 PM

## 2012-02-06 ENCOUNTER — Other Ambulatory Visit: Payer: Self-pay | Admitting: Oncology

## 2012-02-07 ENCOUNTER — Other Ambulatory Visit: Payer: Self-pay | Admitting: Nurse Practitioner

## 2012-02-07 MED ORDER — PREDNISONE 5 MG PO TABS: 5.0000 mg | ORAL_TABLET | Freq: Every day | ORAL | Status: DC

## 2012-02-07 MED ORDER — KETOCONAZOLE 200 MG PO TABS: 200.0000 mg | ORAL_TABLET | Freq: Three times a day (TID) | ORAL | Status: DC

## 2012-02-23 ENCOUNTER — Telehealth: Payer: Self-pay | Admitting: Oncology

## 2012-02-23 ENCOUNTER — Ambulatory Visit (HOSPITAL_BASED_OUTPATIENT_CLINIC_OR_DEPARTMENT_OTHER): Payer: Medicare Other | Admitting: Oncology

## 2012-02-23 ENCOUNTER — Other Ambulatory Visit: Payer: Medicare Other | Admitting: Lab

## 2012-02-23 VITALS — BP 118/69 | HR 69 | Temp 97.0°F | Ht 65.0 in | Wt 242.8 lb

## 2012-02-23 DIAGNOSIS — C61 Malignant neoplasm of prostate: Secondary | ICD-10-CM

## 2012-02-23 LAB — CBC WITH DIFFERENTIAL/PLATELET
Basophils Absolute: 0 10*3/uL (ref 0.0–0.1)
Eosinophils Absolute: 0 10*3/uL (ref 0.0–0.5)
HCT: 36.3 % — ABNORMAL LOW (ref 38.4–49.9)
HGB: 11.6 g/dL — ABNORMAL LOW (ref 13.0–17.1)
MCV: 86.3 fL (ref 79.3–98.0)
MONO%: 5.3 % (ref 0.0–14.0)
NEUT#: 6.2 10*3/uL (ref 1.5–6.5)
NEUT%: 76.2 % — ABNORMAL HIGH (ref 39.0–75.0)
RDW: 15.7 % — ABNORMAL HIGH (ref 11.0–14.6)

## 2012-02-23 LAB — COMPREHENSIVE METABOLIC PANEL
Albumin: 4.2 g/dL (ref 3.5–5.2)
BUN: 13 mg/dL (ref 6–23)
CO2: 23 mEq/L (ref 19–32)
Glucose, Bld: 148 mg/dL — ABNORMAL HIGH (ref 70–99)
Potassium: 4.1 mEq/L (ref 3.5–5.3)
Sodium: 140 mEq/L (ref 135–145)
Total Bilirubin: 0.3 mg/dL (ref 0.3–1.2)
Total Protein: 6.5 g/dL (ref 6.0–8.3)

## 2012-02-23 NOTE — Telephone Encounter (Signed)
gv pt appt schedule for July.  

## 2012-02-23 NOTE — Progress Notes (Signed)
Hematology and Oncology Follow Up Visit  [name] [id number] 02-24-1946 66 y.o. 02/23/2012 9:39 AM   [name], [name]  [name], [name]  Principle Diagnosis:This is a 66 year old gentleman with prostate cancer initially diagnosed in 2001.  He had a Gleason score of 3 + 3 = 6.  He has castration-resistant disease with pelvic adenopathy.     Prior Therapy:  1. Status post prostatectomy done in May 2001.  He had T3 N1 disease.  PSA nadir to 0.  2. The patient developed biochemical relapse and received prostatic bed radiation.  3. The patient received Lupron with Casodex due to rising PSA.  The patient subsequently developed castration-resistant disease. 4. Patient treated with Casodex withdrawal and subsequently PSA rose to 18. 5. Patient with Provenge immunotherapy completed in July 2011.    Current therapy: 1. He is on ketoconazole and prednisone since December 2011, his PSA down from 154 to 41. Most recent PSA was 53 (12/24/2011)   2. He is on Lupron 30 mg every 4 months. He is due for an injection on 04/25/2012.  Interim History: [name] presents today for a followup visit.  He has continued to tolerate ketoconazole and prednisone very well without any significant toxicity.  He is not reporting any nausea.  He is not reporting any vomiting.  Had not reported any abdominal pain or back pain.  He had not had any other GI toxicities at this time.  Overall, his performance status and activity levels have not changed.  He continues to perform activities of daily living without any hindrance or decline.  He had not reported any back pain.  Had not reported any genitourinary complaints.  His overall performance status and activity levels remain unchanged.He did report mild lower extremity edema. No new complications noted since the last visit. He did lose 7 lbs since the last visit on purpose.    Medications: I have reviewed the patient's current medications. Current  outpatient prescriptions:amLODipine (NORVASC) 10 MG tablet, Take 10 mg by mouth daily.  , Disp: , Rfl: ;  aspirin 81 MG tablet, Take 81 mg by mouth daily.  , Disp: , Rfl: ;  Calcium Carbonate-Vitamin D (CALCIUM + D PO), Take by mouth daily.  , Disp: , Rfl: ;  docusate-casanthranol (PERICOLACE) 100-30 MG per capsule, Take 1 capsule by mouth daily as needed.  , Disp: , Rfl:  ketoconazole (NIZORAL) 200 MG tablet, Take 1 tablet (200 mg total) by mouth 3 (three) times daily., Disp: 270 tablet, Rfl: 1;  leuprolide (LUPRON) 30 MG injection, Inject 30 mg into the muscle every 4 (four) months.  , Disp: , Rfl: ;  losartan-hydrochlorothiazide (HYZAAR) 100-12.5 MG per tablet, Take 1 tablet by mouth daily.  , Disp: , Rfl: ;  predniSONE (DELTASONE) 5 MG tablet, Take 1 tablet (5 mg total) by mouth daily., Disp: 180 tablet, Rfl: 1 DISCONTD: ketoconazole (NIZORAL) 200 MG tablet, Take 200 mg by mouth 3 (three) times daily.  , Disp: , Rfl:   Allergies: No Known Allergies  Past Medical History, Surgical history, Social history, and Family History were reviewed and updated.  Review of Systems: Constitutional:  Negative for fever, chills, night sweats, anorexia, weight loss, pain. Cardiovascular: no chest pain or dyspnea on exertion Respiratory: no cough, shortness of breath, or wheezing Neurological: no TIA or stroke symptoms Dermatological: negative ENT: negative Skin Gastrointestinal: no abdominal pain, change in bowel habits, or black or bloody stools Genito-Urinary: no dysuria, trouble voiding, or hematuria Hematological  and Lymphatic: negative  Musculoskeletal: negative Remaining ROS negative. Physical Exam: Blood pressure 118/69, pulse 69, temperature 97 F (36.1 C), temperature source Oral, height 5\' 5"  (1.651 m), weight 242 lb 12.8 oz (110.133 kg). ECOG: 1 General appearance: alert Head: Normocephalic, without obvious abnormality, atraumatic Neck: no adenopathy, no carotid bruit, no JVD, supple,  symmetrical, trachea midline and thyroid not enlarged, symmetric, no tenderness/mass/nodules Lymph nodes: Cervical, supraclavicular, and axillary nodes normal. Heart:regular rate and rhythm, S1, S2 normal, no murmur, click, rub or gallop Lung:chest clear, no wheezing, rales, normal symmetric air entry, Heart exam - S1, S2 normal, no murmur, no gallop, rate regular Abdomin: soft, non-tender, without masses or organomegaly EXT:1+ edema noted.   Lab Results: Lab Results  Component Value Date   WBC 8.2 02/23/2012   HGB 11.6* 02/23/2012   HCT 36.3* 02/23/2012   MCV 86.3 02/23/2012   PLT 279 02/23/2012     Chemistry      Component Value Date/Time   NA 140 12/24/2011 1521   K 3.8 12/24/2011 1521   CL 102 12/24/2011 1521   CO2 25 12/24/2011 1521   BUN 17 12/24/2011 1521   CREATININE 1.07 12/24/2011 1521      Component Value Date/Time   CALCIUM 9.2 12/24/2011 1521   ALKPHOS 54 12/24/2011 1521   AST 17 12/24/2011 1521   ALT 21 12/24/2011 1521   BILITOT 0.2* 12/24/2011 1521     Results for TAYTUM, WHELLER (MRN [id number]) as of 02/23/2012 09:30  Ref. Range 10/21/2011 14:44 12/24/2011 15:21  PSA Latest Range: <=4.00 ng/mL 42.26 (H) 53.89 (H)    Impression and Plan:   This is a 66 year old gentleman with the following issues: 1. 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  His disease seems to be under control.  His PSA is stable at this point. He has been on  ketoconazole approaching more than 17 months.  He has good tolerance to the medication, and the plan is to continue him on the current regimen as long as he can tolerate it without any toxicity and continues to have a PSA response. If his PSA start to rise rapidly, I will restage him with CT scan and bone scan and likely start Zytiga.  2. Hormonal deprivation.  He continues to be on Lupron, next dose will be given on 04/2012 3. Hypertension seems to be under good control.   4. Followup will be in 8   weeks.    [facility], MD 5/15/20139:39 AM

## 2012-04-17 ENCOUNTER — Other Ambulatory Visit: Payer: Self-pay | Admitting: *Deleted

## 2012-04-17 MED ORDER — PREDNISONE 5 MG PO TABS: 5.0000 mg | ORAL_TABLET | Freq: Every day | ORAL | Status: DC

## 2012-04-21 ENCOUNTER — Encounter: Payer: Self-pay | Admitting: Oncology

## 2012-04-21 ENCOUNTER — Telehealth: Payer: Self-pay | Admitting: Oncology

## 2012-04-21 ENCOUNTER — Other Ambulatory Visit (HOSPITAL_BASED_OUTPATIENT_CLINIC_OR_DEPARTMENT_OTHER): Payer: Medicare Other | Admitting: Lab

## 2012-04-21 ENCOUNTER — Ambulatory Visit (HOSPITAL_BASED_OUTPATIENT_CLINIC_OR_DEPARTMENT_OTHER): Payer: Medicare Other | Admitting: Oncology

## 2012-04-21 VITALS — BP 122/70 | HR 70 | Temp 98.6°F | Ht 65.0 in | Wt 241.3 lb

## 2012-04-21 DIAGNOSIS — Z5111 Encounter for antineoplastic chemotherapy: Secondary | ICD-10-CM

## 2012-04-21 DIAGNOSIS — C61 Malignant neoplasm of prostate: Secondary | ICD-10-CM

## 2012-04-21 LAB — COMPREHENSIVE METABOLIC PANEL
ALT: 22 U/L (ref 0–53)
Alkaline Phosphatase: 47 U/L (ref 39–117)
CO2: 23 mEq/L (ref 19–32)
Creatinine, Ser: 1.19 mg/dL (ref 0.50–1.35)
Glucose, Bld: 159 mg/dL — ABNORMAL HIGH (ref 70–99)
Total Bilirubin: 0.4 mg/dL (ref 0.3–1.2)

## 2012-04-21 LAB — PSA: PSA: 89.52 ng/mL — ABNORMAL HIGH (ref <=4.00)

## 2012-04-21 LAB — CBC WITH DIFFERENTIAL/PLATELET
BASO%: 0.8 % (ref 0.0–2.0)
HCT: 34.7 % — ABNORMAL LOW (ref 38.4–49.9)
LYMPH%: 15.4 % (ref 14.0–49.0)
MCH: 27.9 pg (ref 27.2–33.4)
MCHC: 32.1 g/dL (ref 32.0–36.0)
MCV: 86.9 fL (ref 79.3–98.0)
MONO#: 0.6 10*3/uL (ref 0.1–0.9)
MONO%: 7.6 % (ref 0.0–14.0)
NEUT%: 74.4 % (ref 39.0–75.0)
Platelets: 221 10*3/uL (ref 140–400)
WBC: 8.1 10*3/uL (ref 4.0–10.3)

## 2012-04-21 MED ORDER — LEUPROLIDE ACETATE (4 MONTH) 30 MG IM KIT
30.0000 mg | PACK | Freq: Once | INTRAMUSCULAR | Status: AC
  Administered 2012-04-21: 30 mg via INTRAMUSCULAR
  Filled 2012-04-21: qty 30

## 2012-04-21 NOTE — Progress Notes (Signed)
Hematology and Oncology Follow Up Visit  [name] [id number] [birth year] 66 y.o. 04/21/2012 10:35 AM   [name], [name]  [name], [name]  Principle Diagnosis:This is a 66 year old gentleman with prostate cancer initially diagnosed in 2001.  He had a Gleason score of 3 + 3 = 6.  He has castration-resistant disease with pelvic adenopathy.     Prior Therapy:  1. Status post prostatectomy done in May 2001.  He had T3 N1 disease.  PSA nadir to 0.  2. The patient developed biochemical relapse and received prostatic bed radiation.  3. The patient received Lupron with Casodex due to rising PSA.  The patient subsequently developed castration-resistant disease. 4. Patient treated with Casodex withdrawal and subsequently PSA rose to 18. 5. Patient with Provenge immunotherapy completed in July 2011.  Current therapy: 1. He is on ketoconazole and prednisone since December 2011, his PSA down from 154 to 41. Most recent PSA was 53 (12/24/2011)   2. He is on Lupron 30 mg every 4 months. He is due for an injection today.  Interim History: [name] presents today for a followup visit.  He has continued to tolerate ketoconazole and prednisone very well without any significant toxicity.  He is not reporting any nausea.  He is not reporting any vomiting.  Had not reported any abdominal pain or back pain.  He had not had any other GI toxicities at this time.  Overall, his performance status and activity levels have not changed.  He continues to perform activities of daily living without any hindrance or decline.  He had not reported any back pain.  Had not reported any genitourinary complaints.  His overall performance status and activity levels remain unchanged.He did report mild lower extremity edema. No new complications noted since the last visit. Weight is stable.   Medications: I have reviewed the patient's current medications. Current outpatient prescriptions:amLODipine (NORVASC)  10 MG tablet, Take 10 mg by mouth daily.  , Disp: , Rfl: ;  aspirin 81 MG tablet, Take 81 mg by mouth daily.  , Disp: , Rfl: ;  Calcium Carbonate-Vitamin D (CALCIUM + D PO), Take by mouth daily.  , Disp: , Rfl: ;  docusate-casanthranol (PERICOLACE) 100-30 MG per capsule, Take 1 capsule by mouth daily as needed.  , Disp: , Rfl:  ketoconazole (NIZORAL) 200 MG tablet, Take 1 tablet (200 mg total) by mouth 3 (three) times daily., Disp: 270 tablet, Rfl: 1;  leuprolide (LUPRON) 30 MG injection, Inject 30 mg into the muscle every 4 (four) months.  , Disp: , Rfl: ;  losartan-hydrochlorothiazide (HYZAAR) 100-12.5 MG per tablet, Take 1 tablet by mouth daily.  , Disp: , Rfl: ;  predniSONE (DELTASONE) 5 MG tablet, Take 1 tablet (5 mg total) by mouth daily., Disp: 180 tablet, Rfl: 1 DISCONTD: ketoconazole (NIZORAL) 200 MG tablet, Take 200 mg by mouth 3 (three) times daily.  , Disp: , Rfl:  Current facility-administered medications:leuprolide (LUPRON) injection 30 mg, 30 mg, Intramuscular, Once, Myrtis Ser, NP, 30 mg at 04/21/12 1034  Allergies: No Known Allergies  Past Medical History, Surgical history, Social history, and Family History were reviewed and updated.  Review of Systems: Constitutional:  Negative for fever, chills, night sweats, anorexia, weight loss, pain. Cardiovascular: no chest pain or dyspnea on exertion Respiratory: no cough, shortness of breath, or wheezing Neurological: no TIA or stroke symptoms Dermatological: negative ENT: negative Skin: Negative Gastrointestinal: no abdominal pain, change in bowel habits, or black or bloody  stools Genito-Urinary: no dysuria, trouble voiding, or hematuria Hematological and Lymphatic: negative Musculoskeletal: negative Remaining ROS negative.  Physical Exam: Blood pressure 122/70, pulse 70, temperature 98.6 F (37 C), temperature source Oral, height 5\' 5"  (1.651 m), weight 241 lb 4.8 oz (109.453 kg). ECOG: 1 General appearance: alert Head:  Normocephalic, without obvious abnormality, atraumatic Neck: no adenopathy, no carotid bruit, no JVD, supple, symmetrical, trachea midline and thyroid not enlarged, symmetric, no tenderness/mass/nodules Lymph nodes: Cervical, supraclavicular, and axillary nodes normal. Heart:regular rate and rhythm, S1, S2 normal, no murmur, click, rub or gallop Lung:chest clear, no wheezing, rales, normal symmetric air entry, Heart exam - S1, S2 normal, no murmur, no gallop, rate regular Abdomen: soft, non-tender, without masses or organomegaly EXT:1+ edema noted.  Lab Results: Lab Results  Component Value Date   WBC 8.1 04/21/2012   HGB 11.1* 04/21/2012   HCT 34.7* 04/21/2012   MCV 86.9 04/21/2012   PLT 221 04/21/2012     Chemistry      Component Value Date/Time   NA 140 02/23/2012 0918   K 4.1 02/23/2012 0918   CL 105 02/23/2012 0918   CO2 23 02/23/2012 0918   BUN 13 02/23/2012 0918   CREATININE 1.11 02/23/2012 0918      Component Value Date/Time   CALCIUM 8.9 02/23/2012 0918   ALKPHOS 46 02/23/2012 0918   AST 16 02/23/2012 0918   ALT 21 02/23/2012 0918   BILITOT 0.3 02/23/2012 0918     Results for [name], [name] (MRN [id number]) as of 04/21/2012 09:42  Ref. Range 07/14/2011 15:09 07/14/2011 15:09 08/26/2011 08:49 10/21/2011 14:44 12/24/2011 15:21  PSA Latest Range: <=4.00 ng/mL 48.40 (H) 48.40 (H) 43.29 (H) 42.26 (H) 53.89 (H)   Impression and Plan:   This is a 66 year old gentleman with the following issues: 1. 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  His disease seems to be under control.  His PSA is stable at this point. He has been on  ketoconazole about 18 months.  He has good tolerance to the medication, and the plan is to continue him on the current regimen as long as he can tolerate it without any toxicity and continues to have a PSA response. If his PSA start to rise rapidly, I will restage him with CT scan and bone scan and likely start Zytiga.  2. Hormonal deprivation.   He continues to be on Lupron. He received a dose today. 3. Hypertension seems to be under good control.   4. Followup will be in 8  weeks.    [place], [place] 7/12/201310:35 AM

## 2012-04-21 NOTE — Telephone Encounter (Signed)
gve the pt his sept 2013 appt calendar °

## 2012-06-15 ENCOUNTER — Ambulatory Visit (HOSPITAL_BASED_OUTPATIENT_CLINIC_OR_DEPARTMENT_OTHER): Payer: Medicare Other | Admitting: Oncology

## 2012-06-15 ENCOUNTER — Telehealth: Payer: Self-pay | Admitting: Oncology

## 2012-06-15 ENCOUNTER — Other Ambulatory Visit (HOSPITAL_BASED_OUTPATIENT_CLINIC_OR_DEPARTMENT_OTHER): Payer: Medicare Other | Admitting: Lab

## 2012-06-15 VITALS — BP 106/68 | HR 61 | Temp 97.9°F | Resp 20

## 2012-06-15 DIAGNOSIS — R599 Enlarged lymph nodes, unspecified: Secondary | ICD-10-CM

## 2012-06-15 DIAGNOSIS — C61 Malignant neoplasm of prostate: Secondary | ICD-10-CM

## 2012-06-15 LAB — COMPREHENSIVE METABOLIC PANEL (CC13)
ALT: 20 U/L (ref 0–55)
AST: 15 U/L (ref 5–34)
Albumin: 3.9 g/dL (ref 3.5–5.0)
Calcium: 9 mg/dL (ref 8.4–10.4)
Chloride: 107 mEq/L (ref 98–107)
Creatinine: 1.4 mg/dL — ABNORMAL HIGH (ref 0.7–1.3)
Potassium: 4.4 mEq/L (ref 3.5–5.1)
Sodium: 139 mEq/L (ref 136–145)

## 2012-06-15 LAB — CBC WITH DIFFERENTIAL/PLATELET
BASO%: 0.8 % (ref 0.0–2.0)
MCHC: 32.9 g/dL (ref 32.0–36.0)
MONO#: 0.5 10*3/uL (ref 0.1–0.9)
RBC: 4.02 10*6/uL — ABNORMAL LOW (ref 4.20–5.82)
WBC: 6.2 10*3/uL (ref 4.0–10.3)
lymph#: 1.4 10*3/uL (ref 0.9–3.3)

## 2012-06-15 LAB — PSA: PSA: 105.3 ng/mL — ABNORMAL HIGH (ref <=4.00)

## 2012-06-15 NOTE — Telephone Encounter (Signed)
gve the pt his nov 2013 appt calendar °

## 2012-06-15 NOTE — Progress Notes (Signed)
Hematology and Oncology Follow Up Visit  [name] [id number] 06-05-1946 66 y.o. 06/15/2012 1:57 PM   [name] Millard. [name], [name]  [name]. [name], [name]  Principle Diagnosis:This is a 66 year old gentleman with prostate cancer initially diagnosed in 2001.  He had a Gleason score of 3 + 3 = 6.  He has castration-resistant disease with pelvic adenopathy.     Prior Therapy:  1. Status post prostatectomy done in May 2001.  He had T3 N1 disease.  PSA nadir to 0.  2. The patient developed biochemical relapse and received prostatic bed radiation.  3. The patient received Lupron with Casodex due to rising PSA.  The patient subsequently developed castration-resistant disease. 4. Patient treated with Casodex withdrawal and subsequently PSA rose to 18. 5. Patient with Provenge immunotherapy completed in July 2011.  Current therapy: 1. He is on ketoconazole and prednisone since December 2011, his PSA down from 154 to 41. Most recent PSA was 53 (12/24/2011)   2. He is on Lupron 30 mg every 4 months. He is due for an injection in 08/2012  Interim History: [name] presents today for a followup visit.  He has continued to tolerate ketoconazole and prednisone very well without any significant toxicity.  He is not reporting any nausea.  He is not reporting any vomiting.  Had not reported any abdominal pain or back pain.  He had not had any other GI toxicities at this time.  Overall, his performance status and activity levels have not changed.  He continues to perform activities of daily living without any hindrance or decline.  He had not reported any back pain.  Had not reported any genitourinary complaints.  His overall performance status and activity levels remain unchanged.He did report mild lower extremity edema. No new complications noted since the last visit. Weight is stable. He resumed his work related duties after a summer break. He is doing that with out a problem.    Medications: I have  reviewed the patient's current medications. Current outpatient prescriptions:amLODipine (NORVASC) 10 MG tablet, Take 10 mg by mouth daily.  , Disp: , Rfl: ;  aspirin 81 MG tablet, Take 81 mg by mouth daily.  , Disp: , Rfl: ;  Calcium Carbonate-Vitamin D (CALCIUM + D PO), Take by mouth daily.  , Disp: , Rfl: ;  docusate-casanthranol (PERICOLACE) 100-30 MG per capsule, Take 1 capsule by mouth daily as needed.  , Disp: , Rfl:  ketoconazole (NIZORAL) 200 MG tablet, Take 1 tablet (200 mg total) by mouth 3 (three) times daily., Disp: 270 tablet, Rfl: 1;  leuprolide (LUPRON) 30 MG injection, Inject 30 mg into the muscle every 4 (four) months.  , Disp: , Rfl: ;  losartan-hydrochlorothiazide (HYZAAR) 100-12.5 MG per tablet, Take 1 tablet by mouth daily.  , Disp: , Rfl: ;  predniSONE (DELTASONE) 5 MG tablet, Take 1 tablet (5 mg total) by mouth daily., Disp: 180 tablet, Rfl: 1 DISCONTD: ketoconazole (NIZORAL) 200 MG tablet, Take 200 mg by mouth 3 (three) times daily.  , Disp: , Rfl:   Allergies: No Known Allergies  Past Medical History, Surgical history, Social history, and Family History were reviewed and updated.  Review of Systems: Constitutional:  Negative for fever, chills, night sweats, anorexia, weight loss, pain. Cardiovascular: no chest pain or dyspnea on exertion Respiratory: no cough, shortness of breath, or wheezing Neurological: no TIA or stroke symptoms Dermatological: negative ENT: negative Skin: Negative Gastrointestinal: no abdominal pain, change in bowel habits, or black or bloody  stools Genito-Urinary: no dysuria, trouble voiding, or hematuria Hematological and Lymphatic: negative Musculoskeletal: negative Remaining ROS negative.  Physical Exam: Blood pressure 106/68, pulse 61, temperature 97.9 F (36.6 C), temperature source Oral, resp. rate 20. ECOG: 1 General appearance: alert Head: Normocephalic, without obvious abnormality, atraumatic Neck: no adenopathy, no carotid bruit, no  JVD, supple, symmetrical, trachea midline and thyroid not enlarged, symmetric, no tenderness/mass/nodules Lymph nodes: Cervical, supraclavicular, and axillary nodes normal. Heart:regular rate and rhythm, S1, S2 normal, no murmur, click, rub or gallop Lung:chest clear, no wheezing, rales, normal symmetric air entry, Heart exam - S1, S2 normal, no murmur, no gallop, rate regular Abdomen: soft, non-tender, without masses or organomegaly EXT:1+ edema noted.  Lab Results: Lab Results  Component Value Date   WBC 8.1 04/21/2012   HGB 11.1* 04/21/2012   HCT 34.7* 04/21/2012   MCV 86.9 04/21/2012   PLT 221 04/21/2012     Chemistry      Component Value Date/Time   NA 139 04/21/2012 0939   K 3.8 04/21/2012 0939   CL 105 04/21/2012 0939   CO2 23 04/21/2012 0939   BUN 14 04/21/2012 0939   CREATININE 1.19 04/21/2012 0939      Component Value Date/Time   CALCIUM 8.7 04/21/2012 0939   ALKPHOS 47 04/21/2012 0939   AST 16 04/21/2012 0939   ALT 22 04/21/2012 0939   BILITOT 0.4 04/21/2012 0939     Results for [name], [name] (MRN [id number]) as of 06/15/2012 13:58  Ref. Range 12/24/2011 15:21 04/21/2012 09:39  PSA Latest Range: <=4.00 ng/mL 53.89 (H) 89.52 (H)    Impression and Plan:   This is a 66 year old gentleman with the following issues: 1. 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  His disease seems to be under control.  His PSA has been rising in recent months. He has been on  ketoconazole about 20 months.  He has good tolerance to the medication, and the plan is to continue him on the current regimen as long as he can tolerate it without any toxicity and continues to have a PSA response. If his PSA continues to rise rapidly, I will restage him with CT scan and bone scan and likely start Zytiga.  2. Hormonal deprivation.  He continues to be on Lupron. He received a dose today. 3. Hypertension seems to be under good control.   4. Followup will be in 8   weeks.    Twylia Oka 9/5/20131:57 PM

## 2012-07-13 ENCOUNTER — Other Ambulatory Visit: Payer: Self-pay | Admitting: Oncology

## 2012-07-31 ENCOUNTER — Encounter: Payer: Self-pay | Admitting: Gastroenterology

## 2012-08-29 ENCOUNTER — Telehealth: Payer: Self-pay | Admitting: Oncology

## 2012-08-29 ENCOUNTER — Other Ambulatory Visit (HOSPITAL_BASED_OUTPATIENT_CLINIC_OR_DEPARTMENT_OTHER): Payer: Medicare Other

## 2012-08-29 ENCOUNTER — Ambulatory Visit (HOSPITAL_BASED_OUTPATIENT_CLINIC_OR_DEPARTMENT_OTHER): Payer: Medicare Other | Admitting: Oncology

## 2012-08-29 VITALS — BP 108/72 | HR 60 | Temp 98.1°F | Resp 22 | Ht 65.0 in | Wt 242.6 lb

## 2012-08-29 DIAGNOSIS — Z5111 Encounter for antineoplastic chemotherapy: Secondary | ICD-10-CM

## 2012-08-29 DIAGNOSIS — R599 Enlarged lymph nodes, unspecified: Secondary | ICD-10-CM

## 2012-08-29 DIAGNOSIS — C61 Malignant neoplasm of prostate: Secondary | ICD-10-CM

## 2012-08-29 DIAGNOSIS — E291 Testicular hypofunction: Secondary | ICD-10-CM

## 2012-08-29 LAB — COMPREHENSIVE METABOLIC PANEL (CC13)
ALT: 18 U/L (ref 0–55)
AST: 15 U/L (ref 5–34)
Alkaline Phosphatase: 57 U/L (ref 40–150)
Calcium: 9.1 mg/dL (ref 8.4–10.4)
Chloride: 107 mEq/L (ref 98–107)
Creatinine: 1.1 mg/dL (ref 0.7–1.3)
Total Bilirubin: 0.27 mg/dL (ref 0.20–1.20)

## 2012-08-29 LAB — CBC WITH DIFFERENTIAL/PLATELET
BASO%: 0.7 % (ref 0.0–2.0)
EOS%: 2 % (ref 0.0–7.0)
HCT: 34.1 % — ABNORMAL LOW (ref 38.4–49.9)
MCH: 26.9 pg — ABNORMAL LOW (ref 27.2–33.4)
MCHC: 32 g/dL (ref 32.0–36.0)
NEUT%: 67.8 % (ref 39.0–75.0)
lymph#: 1.4 10*3/uL (ref 0.9–3.3)

## 2012-08-29 MED ORDER — LEUPROLIDE ACETATE (4 MONTH) 30 MG IM KIT
30.0000 mg | PACK | Freq: Once | INTRAMUSCULAR | Status: AC
  Administered 2012-08-29: 30 mg via INTRAMUSCULAR
  Filled 2012-08-29: qty 30

## 2012-08-29 NOTE — Progress Notes (Signed)
Hematology and Oncology Follow Up Visit  [name] [id number] 1946-01-24 66 y.o. 08/29/2012 3:41 PM   [name], [name]  [name], [name]  Principle Diagnosis:This is a 66 year old gentleman with prostate cancer initially diagnosed in 2001.  He had a Gleason score of 3 + 3 = 6.  He has castration-resistant disease with pelvic adenopathy.    Prior Therapy:  1. Status post prostatectomy done in May 2001.  He had T3 N1 disease.  PSA nadir to 0.  2. The patient developed biochemical relapse and received prostatic bed radiation.  3. The patient received Lupron with Casodex due to rising PSA.  The patient subsequently developed castration-resistant disease. 4. Patient treated with Casodex withdrawal and subsequently PSA rose to 18. 5. Patient with Provenge immunotherapy completed in July 2011.  Current therapy: 1. He is on ketoconazole and prednisone since December 2011.  2. He is on Lupron 30 mg every 4 months. He is due for an injection in 08/2012  Interim History: [name] presents today for a followup visit.  He has continued to tolerate ketoconazole and prednisone very well without any significant toxicity.  He is not reporting any nausea.  He is not reporting any vomiting.  Had not reported any abdominal pain or back pain.  He had not had any other GI toxicities at this time.  Overall, his performance status and activity levels have not changed.  He continues to perform activities of daily living without any hindrance or decline.  He had not reported any back pain.  Had not reported any genitourinary complaints.  His overall performance status and activity levels remain unchanged.He did report mild lower extremity edema. No new complications noted since the last visit. Weight is stable.  No new symptoms reported.    Medications: I have reviewed the patient's current medications. Current outpatient prescriptions:amLODipine (NORVASC) 10 MG tablet, Take 10 mg by mouth  daily.  , Disp: , Rfl: ;  aspirin 81 MG tablet, Take 81 mg by mouth daily.  , Disp: , Rfl: ;  Calcium Carbonate-Vitamin D (CALCIUM + D PO), Take by mouth daily.  , Disp: , Rfl: ;  docusate-casanthranol (PERICOLACE) 100-30 MG per capsule, Take 1 capsule by mouth daily as needed.  , Disp: , Rfl:  ketoconazole (NIZORAL) 200 MG tablet, TAKE 1 TABLET THREE TIMES A DAY, Disp: 270 tablet, Rfl: 0;  leuprolide (LUPRON) 30 MG injection, Inject 30 mg into the muscle every 4 (four) months.  , Disp: , Rfl: ;  losartan-hydrochlorothiazide (HYZAAR) 100-12.5 MG per tablet, Take 1 tablet by mouth daily.  , Disp: , Rfl: ;  predniSONE (DELTASONE) 5 MG tablet, Take 1 tablet (5 mg total) by mouth daily., Disp: 180 tablet, Rfl: 1 [DISCONTINUED] ketoconazole (NIZORAL) 200 MG tablet, Take 200 mg by mouth 3 (three) times daily.  , Disp: , Rfl:  Current facility-administered medications:leuprolide (LUPRON) injection 30 mg, 30 mg, Intramuscular, Once, [name] Ser, NP  Allergies: No Known Allergies  Past Medical History, Surgical history, Social history, and Family History were reviewed and updated.  Review of Systems: Constitutional:  Negative for fever, chills, night sweats, anorexia, weight loss, pain. Cardiovascular: no chest pain or dyspnea on exertion Respiratory: no cough, shortness of breath, or wheezing Neurological: no TIA or stroke symptoms Dermatological: negative ENT: negative Skin: Negative Gastrointestinal: no abdominal pain, change in bowel habits, or black or bloody stools Genito-Urinary: no dysuria, trouble voiding, or hematuria Hematological and Lymphatic: negative Musculoskeletal: negative Remaining ROS negative.  Physical Exam: Blood pressure 108/72, pulse 60, temperature 98.1 F (36.7 C), temperature source Oral, resp. rate 22, height 5\' 5"  (1.651 m), weight 242 lb 9.6 oz (110.043 kg). ECOG: 1 General appearance: alert Head: Normocephalic, without obvious abnormality, atraumatic Neck: no  adenopathy, no carotid bruit, no JVD, supple, symmetrical, trachea midline and thyroid not enlarged, symmetric, no tenderness/mass/nodules Lymph nodes: Cervical, supraclavicular, and axillary nodes normal. Heart:regular rate and rhythm, S1, S2 normal, no murmur, click, rub or gallop Lung:chest clear, no wheezing, rales, normal symmetric air entry, Heart exam - S1, S2 normal, no murmur, no gallop, rate regular Abdomen: soft, non-tender, without masses or organomegaly EXT:1+ edema noted.  Lab Results: Lab Results  Component Value Date   WBC 7.0 08/29/2012   HGB 10.9* 08/29/2012   HCT 34.1* 08/29/2012   MCV 84.2 08/29/2012   PLT 212 08/29/2012     Chemistry      Component Value Date/Time   NA 139 06/15/2012 1341   NA 139 04/21/2012 0939   K 4.4 06/15/2012 1341   K 3.8 04/21/2012 0939   CL 107 06/15/2012 1341   CL 105 04/21/2012 0939   CO2 21* 06/15/2012 1341   CO2 23 04/21/2012 0939   BUN 23.0 06/15/2012 1341   BUN 14 04/21/2012 0939   CREATININE 1.4* 06/15/2012 1341   CREATININE 1.19 04/21/2012 0939      Component Value Date/Time   CALCIUM 9.0 06/15/2012 1341   CALCIUM 8.7 04/21/2012 0939   ALKPHOS 52 06/15/2012 1341   ALKPHOS 47 04/21/2012 0939   AST 15 06/15/2012 1341   AST 16 04/21/2012 0939   ALT 20 06/15/2012 1341   ALT 22 04/21/2012 0939   BILITOT 0.30 06/15/2012 1341   BILITOT 0.4 04/21/2012 0939      Results for [name] (MRN [id number]) as of 08/29/2012 15:29  Ref. Range 12/24/2011 15:21 04/21/2012 09:39 06/15/2012 13:41  PSA Latest Range: <=4.00 ng/mL 53.89 (H) 89.52 (H) 105.30 (H)    Impression and Plan:   This is a 66 year old gentleman with the following issues: 1. 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 His PSA has been rising in recent months. He has been on  ketoconazole since 09/2010. He has good tolerance to the medication, and the plan is to restage him with CT scan and likely start Zytiga if the scan shows progression of disease.  2. Hormonal  deprivation.  He continues to be on Lupron. He received a dose today and in 12/2012 3. Hypertension seems to be under good control.   4. Followup will be in 8  weeks.    [name] 11/19/20133:41 PM

## 2012-08-29 NOTE — Telephone Encounter (Signed)
Gave pt appt for lab for January 14th , pt will be called by radiology scheduling for CT, gave pt oral contrast. Patient will see MD after a few days on January 2014

## 2012-10-11 ENCOUNTER — Other Ambulatory Visit: Payer: Self-pay | Admitting: Oncology

## 2012-10-24 ENCOUNTER — Other Ambulatory Visit (HOSPITAL_BASED_OUTPATIENT_CLINIC_OR_DEPARTMENT_OTHER): Payer: Medicare Other

## 2012-10-24 ENCOUNTER — Ambulatory Visit (HOSPITAL_COMMUNITY)
Admission: RE | Admit: 2012-10-24 | Discharge: 2012-10-24 | Disposition: A | Discharge status: Home / Self Care | Payer: Medicare Other | Source: Ambulatory Visit | Attending: Oncology | Admitting: Oncology

## 2012-10-24 ENCOUNTER — Encounter (HOSPITAL_COMMUNITY): Payer: Self-pay

## 2012-10-24 DIAGNOSIS — C61 Malignant neoplasm of prostate: Secondary | ICD-10-CM | POA: Insufficient documentation

## 2012-10-24 DIAGNOSIS — E279 Disorder of adrenal gland, unspecified: Secondary | ICD-10-CM | POA: Insufficient documentation

## 2012-10-24 DIAGNOSIS — N289 Disorder of kidney and ureter, unspecified: Secondary | ICD-10-CM | POA: Insufficient documentation

## 2012-10-24 DIAGNOSIS — R599 Enlarged lymph nodes, unspecified: Secondary | ICD-10-CM | POA: Insufficient documentation

## 2012-10-24 LAB — COMPREHENSIVE METABOLIC PANEL (CC13)
ALT: 17 U/L (ref 0–55)
AST: 15 U/L (ref 5–34)
Albumin: 3.7 g/dL (ref 3.5–5.0)
Alkaline Phosphatase: 58 U/L (ref 40–150)
BUN: 15 mg/dL (ref 7.0–26.0)
Potassium: 4 mEq/L (ref 3.5–5.1)
Sodium: 139 mEq/L (ref 136–145)

## 2012-10-24 LAB — CBC WITH DIFFERENTIAL/PLATELET
BASO%: 0.8 % (ref 0.0–2.0)
Basophils Absolute: 0.1 10*3/uL (ref 0.0–0.1)
EOS%: 3.1 % (ref 0.0–7.0)
MCH: 26.7 pg — ABNORMAL LOW (ref 27.2–33.4)
MCHC: 32.1 g/dL (ref 32.0–36.0)
MCV: 83.3 fL (ref 79.3–98.0)
MONO%: 6.4 % (ref 0.0–14.0)
RBC: 4.2 10*6/uL (ref 4.20–5.82)
RDW: 15.6 % — ABNORMAL HIGH (ref 11.0–14.6)
lymph#: 1.7 10*3/uL (ref 0.9–3.3)

## 2012-10-24 MED ORDER — IOHEXOL 300 MG/ML  SOLN
125.0000 mL | Freq: Once | INTRAMUSCULAR | Status: AC | PRN
  Administered 2012-10-24: 125 mL via INTRAVENOUS

## 2012-10-26 ENCOUNTER — Ambulatory Visit (HOSPITAL_BASED_OUTPATIENT_CLINIC_OR_DEPARTMENT_OTHER): Payer: Medicare Other | Admitting: Oncology

## 2012-10-26 ENCOUNTER — Telehealth: Payer: Self-pay | Admitting: Oncology

## 2012-10-26 VITALS — BP 111/67 | HR 64 | Temp 97.4°F | Resp 20 | Ht 65.0 in | Wt 244.1 lb

## 2012-10-26 DIAGNOSIS — R599 Enlarged lymph nodes, unspecified: Secondary | ICD-10-CM

## 2012-10-26 DIAGNOSIS — C61 Malignant neoplasm of prostate: Secondary | ICD-10-CM

## 2012-10-26 DIAGNOSIS — E291 Testicular hypofunction: Secondary | ICD-10-CM

## 2012-10-26 MED ORDER — ABIRATERONE ACETATE 250 MG PO TABS: 1000.0000 mg | ORAL_TABLET | Freq: Every day | ORAL | Status: DC

## 2012-10-26 NOTE — Telephone Encounter (Signed)
appts made and printed for pt aom °

## 2012-10-26 NOTE — Progress Notes (Signed)
Hematology and Oncology Follow Up Visit  [name] [id number] November 04, 1945 67 y.o. 10/26/2012 10:48 AM   [name]. [name], [name]  [name]. [name], [name]  Principle Diagnosis:This is a 67 year old gentleman with prostate cancer initially diagnosed in 2001.  He had a Gleason score of 3 + 3 = 6.  He has castration-resistant disease with pelvic adenopathy.    Prior Therapy:  1. Status post prostatectomy done in May 2001.  He had T3 N1 disease.  PSA nadir to 0.  2. The patient developed biochemical relapse and received prostatic bed radiation.  3. The patient received Lupron with Casodex due to rising PSA.  The patient subsequently developed castration-resistant disease. 4. Patient treated with Casodex withdrawal and subsequently PSA rose to 18. 5. Patient with Provenge immunotherapy completed in July 2011.  Current therapy: 1. He is on ketoconazole and prednisone since December 2011.  2. He is on Lupron 30 mg every 4 months. He is due for an injection in 12/2012.  Interim History: [name] presents today for a followup visit.  He has continued to tolerate ketoconazole and prednisone very well without any significant toxicity.  He is not reporting any nausea.  He is not reporting any vomiting.  Had not reported any abdominal pain or back pain.  He had not had any other GI toxicities at this time.  Overall, his performance status and activity levels have not changed.  He continues to perform activities of daily living without any hindrance or decline.  He had not reported any back pain.  Had not reported any genitourinary complaints.  His overall performance status and activity levels remain unchanged. No new symptoms reported.   Medications: I have reviewed the patient's current medications. Current outpatient prescriptions:amLODipine (NORVASC) 10 MG tablet, Take 10 mg by mouth daily.  , Disp: , Rfl: ;  aspirin 81 MG tablet, Take 81 mg by mouth daily.  , Disp: , Rfl: ;  Calcium  Carbonate-Vitamin D (CALCIUM + D PO), Take by mouth daily.  , Disp: , Rfl: ;  docusate-casanthranol (PERICOLACE) 100-30 MG per capsule, Take 1 capsule by mouth daily as needed.  , Disp: , Rfl:  ketoconazole (NIZORAL) 200 MG tablet, TAKE 1 TABLET THREE TIMES A DAY, Disp: 270 tablet, Rfl: 0;  leuprolide (LUPRON) 30 MG injection, Inject 30 mg into the muscle every 4 (four) months.  , Disp: , Rfl: ;  losartan-hydrochlorothiazide (HYZAAR) 100-12.5 MG per tablet, Take 1 tablet by mouth daily.  , Disp: , Rfl: ;  predniSONE (DELTASONE) 5 MG tablet, Take 1 tablet (5 mg total) by mouth daily., Disp: 180 tablet, Rfl: 1 abiraterone Acetate (ZYTIGA) 250 MG tablet, Take 4 tablets (1,000 mg total) by mouth daily. Take on an empty stomach 1 hour before or 2 hours after a meal, Disp: 120 tablet, Rfl: 0;  [DISCONTINUED] ketoconazole (NIZORAL) 200 MG tablet, Take 200 mg by mouth 3 (three) times daily.  , Disp: , Rfl:   Allergies: No Known Allergies  Past Medical History, Surgical history, Social history, and Family History were reviewed and updated.  Review of Systems: Constitutional:  Negative for fever, chills, night sweats, anorexia, weight loss, pain. Cardiovascular: no chest pain or dyspnea on exertion Respiratory: no cough, shortness of breath, or wheezing Neurological: no TIA or stroke symptoms Dermatological: negative ENT: negative Skin: Negative Gastrointestinal: no abdominal pain, change in bowel habits, or black or bloody stools Genito-Urinary: no dysuria, trouble voiding, or hematuria Hematological and Lymphatic: negative Musculoskeletal: negative Remaining ROS negative.  Physical Exam: Blood pressure 111/67, pulse 64, temperature 97.4 F (36.3 C), temperature source Oral, resp. rate 20, height 5\' 5"  (1.651 m), weight 244 lb 1.6 oz (110.723 kg). ECOG: 1 General appearance: alert Head: Normocephalic, without obvious abnormality, atraumatic Neck: no adenopathy, no carotid bruit, no JVD, supple,  symmetrical, trachea midline and thyroid not enlarged, symmetric, no tenderness/mass/nodules Lymph nodes: Cervical, supraclavicular, and axillary nodes normal. Heart:regular rate and rhythm, S1, S2 normal, no murmur, click, rub or gallop Lung:chest clear, no wheezing, rales, normal symmetric air entry, Heart exam - S1, S2 normal, no murmur, no gallop, rate regular Abdomen: soft, non-tender, without masses or organomegaly EXT:1+ edema noted.  Lab Results: Lab Results  Component Value Date   WBC 7.5 10/24/2012   HGB 11.2* 10/24/2012   HCT 35.0* 10/24/2012   MCV 83.3 10/24/2012   PLT 228 10/24/2012     Chemistry      Component Value Date/Time   NA 139 10/24/2012 0808   NA 139 04/21/2012 0939   K 4.0 10/24/2012 0808   K 3.8 04/21/2012 0939   CL 105 10/24/2012 0808   CL 105 04/21/2012 0939   CO2 22 10/24/2012 0808   CO2 23 04/21/2012 0939   BUN 15.0 10/24/2012 0808   BUN 14 04/21/2012 0939   CREATININE 1.2 10/24/2012 0808   CREATININE 1.19 04/21/2012 0939      Component Value Date/Time   CALCIUM 9.3 10/24/2012 0808   CALCIUM 8.7 04/21/2012 0939   ALKPHOS 58 10/24/2012 0808   ALKPHOS 47 04/21/2012 0939   AST 15 10/24/2012 0808   AST 16 04/21/2012 0939   ALT 17 10/24/2012 0808   ALT 22 04/21/2012 0939   BILITOT 0.44 10/24/2012 0808   BILITOT 0.4 04/21/2012 0939     Results for [name], [name] (MRN [id number]) as of 10/26/2012 09:12  Ref. Range 08/29/2012 14:52 10/24/2012 08:08  PSA Latest Range: <=4.00 ng/mL 143.20 (H) 257.70 (H)    CT ABDOMEN AND PELVIS WITH CONTRAST  Technique: Multidetector CT imaging of the abdomen and pelvis was  performed following the standard protocol during bolus  administration of intravenous contrast.  Contrast: OMNIPAQUE IOHEXOL 300 MG/ML SOLN  Comparison: 08/27/2010  Findings: Lung bases: The lung bases are clear. No pericardial or  pleural effusion.  Abdomen/pelvis: No focal liver abnormality. The gallbladder  appears normal. Normal appearance of the  pancreas. The spleen is  normal.  Left adrenal gland nodule identified. The index left adrenal  nodule measures 1.8 cm, image 21. Previously 1.9 cm. Complex cyst  or solid mass arises from the upper pole the left kidney measuring  1.7 cm, image 81 of the coronal series. Previously this measured  1.3 cm.  No obstructive uropathy. The urinary bladder is collapsed. There  are postsurgical changes from prostatectomy as well as penile  prosthesis implantation. Calcified atherosclerotic change involves  the abdominal aorta. There is no aneurysm.  Upper abdominal and pelvic adenopathy is noted. Index  gastrohepatic ligament lymph node measures 1.4 cm, image 15.  Previously this measured 1.5 cm. Preaortic lymph node mass  measures 2.5 x 6.5 cm, image 29. Previously 2.8 x 6.4 cm.  Enlarged retrocaval lymph node measures 2.5 cm, image 33.  Previously 3.4 cm left iliac node measures 1.7 cm, image 2.  Previously 1.7 cm. Peri rectal adenopathy is again noted. Lymph  node within the central pelvis measures 1.4 cm, image 59.  Previously 1.3 cm.  Bones/Musculoskeletal: Review of the visualized bony structures is  significant for  mild thoracic spondylosis. No worrisome lytic or  sclerotic bone lesions.  IMPRESSION:  1. Upper abdominal and pelvic adenopathy consistent with  metastatic disease is again noted. Compared with previous exam  there is been no significant change  2. Stable adrenal nodules.  3. 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    Impression and Plan:   This is a 67 year old gentleman with the following issues: 1. Castration-resistant prostate cancer with pelvic adenopathy. His PSA has been rising in recent months. He has been on  ketoconazole since 09/2010. CT scan from 10/2012 was reviewed and showed stable disease. Given the rapid rise in his PSA, I am inclined to start Zytiga. Risks and benefits discussed  today. Written information given as well. He will start that instead of Ketoconazole once he gets it. He will take it with once a day prednisone.   2. Hormonal deprivation.  He continues to be on Lupron. He received a dose next in 12/2012 3. Hypertension seems to be under good control.   4. Followup will be in 4-5  Weeks to assess Zytiga complications.     Clarance Bollard 1/16/201410:48 AM

## 2012-10-27 ENCOUNTER — Encounter: Payer: Self-pay | Admitting: Oncology

## 2012-10-27 ENCOUNTER — Other Ambulatory Visit: Payer: Self-pay | Admitting: *Deleted

## 2012-10-27 DIAGNOSIS — C61 Malignant neoplasm of prostate: Secondary | ICD-10-CM

## 2012-10-27 MED ORDER — ABIRATERONE ACETATE 250 MG PO TABS: 1000.0000 mg | ORAL_TABLET | Freq: Every day | ORAL | Status: DC

## 2012-10-27 NOTE — Telephone Encounter (Signed)
THIS REFILL REQUEST FOR ZYTIGA WAS PLACED IN DR.SHADAD'S ACTIVE WORK FOLDER. 

## 2012-10-27 NOTE — Progress Notes (Addendum)
Zytiga Rx to [facility] with  Mardelle Matte.  11/02/12 Called [facility] and spoke to [place] and it has been approved from  11/03/12 to 11/03/13 Case [id number].  11/06/12 Bryan @ [facility] is helping with assistance.

## 2012-11-30 ENCOUNTER — Encounter: Payer: Self-pay | Admitting: Oncology

## 2012-11-30 ENCOUNTER — Other Ambulatory Visit: Payer: Medicare Other | Admitting: Lab

## 2012-11-30 ENCOUNTER — Telehealth: Payer: Self-pay | Admitting: Oncology

## 2012-11-30 ENCOUNTER — Ambulatory Visit (HOSPITAL_BASED_OUTPATIENT_CLINIC_OR_DEPARTMENT_OTHER): Payer: Medicare Other | Admitting: Oncology

## 2012-11-30 VITALS — BP 127/71 | HR 64 | Temp 98.3°F | Resp 18 | Ht 65.0 in | Wt 244.5 lb

## 2012-11-30 DIAGNOSIS — C61 Malignant neoplasm of prostate: Secondary | ICD-10-CM

## 2012-11-30 DIAGNOSIS — R599 Enlarged lymph nodes, unspecified: Secondary | ICD-10-CM

## 2012-11-30 LAB — CBC WITH DIFFERENTIAL/PLATELET
BASO%: 0.7 % (ref 0.0–2.0)
Basophils Absolute: 0 10*3/uL (ref 0.0–0.1)
HCT: 33.7 % — ABNORMAL LOW (ref 38.4–49.9)
HGB: 10.8 g/dL — ABNORMAL LOW (ref 13.0–17.1)
MONO#: 0.5 10*3/uL (ref 0.1–0.9)
NEUT#: 3.6 10*3/uL (ref 1.5–6.5)
NEUT%: 61.1 % (ref 39.0–75.0)
WBC: 5.9 10*3/uL (ref 4.0–10.3)
lymph#: 1.5 10*3/uL (ref 0.9–3.3)

## 2012-11-30 LAB — COMPREHENSIVE METABOLIC PANEL (CC13)
ALT: 22 U/L (ref 0–55)
BUN: 10.9 mg/dL (ref 7.0–26.0)
CO2: 29 mEq/L (ref 22–29)
Calcium: 9 mg/dL (ref 8.4–10.4)
Chloride: 107 mEq/L (ref 98–107)
Creatinine: 0.9 mg/dL (ref 0.7–1.3)

## 2012-11-30 LAB — PSA: PSA: 159.3 ng/mL — ABNORMAL HIGH (ref <=4.00)

## 2012-11-30 MED ORDER — ABIRATERONE ACETATE 250 MG PO TABS: 1000.0000 mg | ORAL_TABLET | Freq: Every day | ORAL | Status: DC

## 2012-11-30 NOTE — Progress Notes (Signed)
Hematology and Oncology Follow Up Visit  [name] [id number] 1946-01-01 67 y.o. 11/30/2012 1:01 PM   [name] Millard. [name], [name]  [name]. [name], [name]  Principle Diagnosis:This is a 67 year old gentleman with prostate cancer initially diagnosed in 2001.  He had a Gleason score of 3 + 3 = 6.  He has castration-resistant disease with pelvic adenopathy.    Prior Therapy:  1. Status post prostatectomy done in May 2001.  He had T3 N1 disease.  PSA nadir to 0.  2. The patient developed biochemical relapse and received prostatic bed radiation.  3. The patient received Lupron with Casodex due to rising PSA.  The patient subsequently developed castration-resistant disease. 4. Patient treated with Casodex withdrawal and subsequently PSA rose to 18. 5. Patient with Provenge immunotherapy completed in July 2011. 6.  He received ketoconazole and prednisone December 2011 through January 2014.  Current therapy: 1. He started Zytiga 1000 mg daily with Prednisone 5 mg daily on 11/03/12.  2. He is on Lupron 30 mg every 4 months. He is due for an injection in 12/2012.  Interim History: [name] presents today for a followup visit. He started on Zytiga last month without any significant toxicity.  He is not reporting any nausea.  He is not reporting any vomiting.  Had not reported any abdominal pain or back pain.  He had not had any other GI toxicities at this time.  Overall, his performance status and activity levels have not changed.  He continues to perform activities of daily living without any hindrance or decline.  He had not reported any back pain.  Had not reported any genitourinary complaints.  His overall performance status and activity levels remain unchanged. No new symptoms reported.   Medications: I have reviewed the patient's current medications. Current outpatient prescriptions:abiraterone Acetate (ZYTIGA) 250 MG tablet, Take 4 tablets (1,000 mg total) by mouth daily. Take on an empty  stomach 1 hour before or 2 hours after a meal, Disp: 120 tablet, Rfl: 1;  amLODipine (NORVASC) 10 MG tablet, Take 10 mg by mouth daily.  , Disp: , Rfl: ;  aspirin 81 MG tablet, Take 81 mg by mouth daily.  , Disp: , Rfl: ;  Calcium Carbonate-Vitamin D (CALCIUM + D PO), Take by mouth daily.  , Disp: , Rfl:  docusate-casanthranol (PERICOLACE) 100-30 MG per capsule, Take 1 capsule by mouth daily as needed.  , Disp: , Rfl: ;  leuprolide (LUPRON) 30 MG injection, Inject 30 mg into the muscle every 4 (four) months.  , Disp: , Rfl: ;  losartan-hydrochlorothiazide (HYZAAR) 100-12.5 MG per tablet, Take 1 tablet by mouth daily.  , Disp: , Rfl: ;  predniSONE (DELTASONE) 5 MG tablet, Take 1 tablet (5 mg total) by mouth daily., Disp: 180 tablet, Rfl: 1  Allergies: No Known Allergies  Past Medical History, Surgical history, Social history, and Family History were reviewed and updated.  Review of Systems: Constitutional:  Negative for fever, chills, night sweats, anorexia, weight loss, pain. Cardiovascular: no chest pain or dyspnea on exertion Respiratory: no cough, shortness of breath, or wheezing Neurological: no TIA or stroke symptoms Dermatological: negative ENT: negative Skin: Negative Gastrointestinal: no abdominal pain, change in bowel habits, or black or bloody stools Genito-Urinary: no dysuria, trouble voiding, or hematuria Hematological and Lymphatic: negative Musculoskeletal: negative Remaining ROS negative.  Physical Exam: Blood pressure 127/71, pulse 64, temperature 98.3 F (36.8 C), temperature source Oral, resp. rate 18, height 5\' 5"  (1.651 m), weight 244 lb 8  oz (110.904 kg). ECOG: 1 General appearance: alert Head: Normocephalic, without obvious abnormality, atraumatic Neck: no adenopathy, no carotid bruit, no JVD, supple, symmetrical, trachea midline and thyroid not enlarged, symmetric, no tenderness/mass/nodules Lymph nodes: Cervical, supraclavicular, and axillary nodes  normal. Heart:regular rate and rhythm, S1, S2 normal, no murmur, click, rub or gallop Lung:chest clear, no wheezing, rales, normal symmetric air entry, Heart exam - S1, S2 normal, no murmur, no gallop, rate regular Abdomen: soft, non-tender, without masses or organomegaly EXT:1+ edema noted.  Lab Results: Lab Results  Component Value Date   WBC 5.9 11/30/2012   HGB 10.8* 11/30/2012   HCT 33.7* 11/30/2012   MCV 84.2 11/30/2012   PLT 201 11/30/2012     Chemistry      Component Value Date/Time   NA 146* 11/30/2012 0819   NA 139 04/21/2012 0939   K 3.3* 11/30/2012 0819   K 3.8 04/21/2012 0939   CL 107 11/30/2012 0819   CL 105 04/21/2012 0939   CO2 29 11/30/2012 0819   CO2 23 04/21/2012 0939   BUN 10.9 11/30/2012 0819   BUN 14 04/21/2012 0939   CREATININE 0.9 11/30/2012 0819   CREATININE 1.19 04/21/2012 0939      Component Value Date/Time   CALCIUM 9.0 11/30/2012 0819   CALCIUM 8.7 04/21/2012 0939   ALKPHOS 60 11/30/2012 0819   ALKPHOS 47 04/21/2012 0939   AST 17 11/30/2012 0819   AST 16 04/21/2012 0939   ALT 22 11/30/2012 0819   ALT 22 04/21/2012 0939   BILITOT 0.40 11/30/2012 0819   BILITOT 0.4 04/21/2012 0939     Results for [name], [name] (MRN [id number]) as of 11/30/2012 08:39  Ref. Range 12/24/2011 15:21 04/21/2012 09:39 06/15/2012 13:41 08/29/2012 14:52 10/24/2012 08:08  PSA Latest Range: <=4.00 ng/mL 53.89 (H) 89.52 (H) 105.30 (H) 143.20 (H) 257.70 (H)     Impression and Plan:   This is a 67 year old gentleman with the following issues: 1. Castration-resistant prostate cancer with pelvic adenopathy. His PSA has been rising in recent months. He received ketoconazole since 09/2010. CT scan from 10/2012 showed stable disease, but he had rapid rise in his PSA. Ketoconazole was stopped and he was started on Zytiga with Prednisone in January 2014. He is tolerating this well. Recommend that he continue Zytiga. PSA is pending today.   2. Hormonal deprivation.  He continues to be on Lupron. Next dose  due 12/2012 3. Hypertension. He is on Norvasc and Hyzaar per PCP.   4. Followup will be in 4-5 weeks.     [name] 2/20/20141:01 PM

## 2012-11-30 NOTE — Patient Instructions (Signed)
Results for [name], [name] (MRN [id number]) as of 11/30/2012 08:39  Ref. Range 12/24/2011 15:21 04/21/2012 09:39 06/15/2012 13:41 08/29/2012 14:52 10/24/2012 08:08  PSA Latest Range: <=4.00 ng/mL 53.89 (H) 89.52 (H) 105.30 (H) 143.20 (H) 257.70 (H)

## 2012-11-30 NOTE — Telephone Encounter (Signed)
Gave pt appt for lab and MD on MArch 2014 °

## 2012-12-04 ENCOUNTER — Telehealth: Payer: Self-pay | Admitting: Oncology

## 2012-12-04 NOTE — Telephone Encounter (Signed)
pt called to move lab to the 19th of March....Done

## 2012-12-19 ENCOUNTER — Encounter (HOSPITAL_COMMUNITY): Payer: Self-pay | Admitting: Emergency Medicine

## 2012-12-19 ENCOUNTER — Inpatient Hospital Stay (HOSPITAL_COMMUNITY)
Admission: EM | Admit: 2012-12-19 | Discharge: 2012-12-27 | DRG: 853 | Disposition: A | Discharge status: Home / Self Care | Payer: Medicare Other | Attending: Internal Medicine | Admitting: Internal Medicine

## 2012-12-19 DIAGNOSIS — E870 Hyperosmolality and hypernatremia: Secondary | ICD-10-CM | POA: Diagnosis present

## 2012-12-19 DIAGNOSIS — R6521 Severe sepsis with septic shock: Secondary | ICD-10-CM

## 2012-12-19 DIAGNOSIS — Z79899 Other long term (current) drug therapy: Secondary | ICD-10-CM

## 2012-12-19 DIAGNOSIS — Z86718 Personal history of other venous thrombosis and embolism: Secondary | ICD-10-CM

## 2012-12-19 DIAGNOSIS — IMO0002 Reserved for concepts with insufficient information to code with codable children: Secondary | ICD-10-CM

## 2012-12-19 DIAGNOSIS — E860 Dehydration: Secondary | ICD-10-CM | POA: Diagnosis present

## 2012-12-19 DIAGNOSIS — Z8546 Personal history of malignant neoplasm of prostate: Secondary | ICD-10-CM

## 2012-12-19 DIAGNOSIS — A419 Sepsis, unspecified organism: Principal | ICD-10-CM | POA: Diagnosis present

## 2012-12-19 DIAGNOSIS — E669 Obesity, unspecified: Secondary | ICD-10-CM | POA: Diagnosis present

## 2012-12-19 DIAGNOSIS — E876 Hypokalemia: Secondary | ICD-10-CM | POA: Diagnosis present

## 2012-12-19 DIAGNOSIS — N390 Urinary tract infection, site not specified: Secondary | ICD-10-CM | POA: Diagnosis present

## 2012-12-19 DIAGNOSIS — G4730 Sleep apnea, unspecified: Secondary | ICD-10-CM | POA: Diagnosis present

## 2012-12-19 DIAGNOSIS — D696 Thrombocytopenia, unspecified: Secondary | ICD-10-CM | POA: Diagnosis present

## 2012-12-19 DIAGNOSIS — I10 Essential (primary) hypertension: Secondary | ICD-10-CM | POA: Diagnosis present

## 2012-12-19 DIAGNOSIS — Z7982 Long term (current) use of aspirin: Secondary | ICD-10-CM

## 2012-12-19 DIAGNOSIS — R6520 Severe sepsis without septic shock: Secondary | ICD-10-CM | POA: Diagnosis present

## 2012-12-19 DIAGNOSIS — R197 Diarrhea, unspecified: Secondary | ICD-10-CM | POA: Diagnosis present

## 2012-12-19 DIAGNOSIS — C61 Malignant neoplasm of prostate: Secondary | ICD-10-CM | POA: Diagnosis present

## 2012-12-19 DIAGNOSIS — N179 Acute kidney failure, unspecified: Secondary | ICD-10-CM | POA: Diagnosis present

## 2012-12-19 DIAGNOSIS — G4733 Obstructive sleep apnea (adult) (pediatric): Secondary | ICD-10-CM | POA: Diagnosis present

## 2012-12-19 LAB — URINALYSIS, ROUTINE W REFLEX MICROSCOPIC
Bilirubin Urine: NEGATIVE
Glucose, UA: NEGATIVE mg/dL
Ketones, ur: NEGATIVE mg/dL
Nitrite: NEGATIVE
Protein, ur: 30 mg/dL — AB
Specific Gravity, Urine: 1.017 (ref 1.005–1.030)
Urobilinogen, UA: 1 mg/dL (ref 0.0–1.0)
pH: 5 (ref 5.0–8.0)

## 2012-12-19 LAB — CBC WITH DIFFERENTIAL/PLATELET
Basophils Absolute: 0 10*3/uL (ref 0.0–0.1)
Basophils Relative: 0 % (ref 0–1)
Eosinophils Absolute: 0 10*3/uL (ref 0.0–0.7)
Eosinophils Relative: 0 % (ref 0–5)
HCT: 29.6 % — ABNORMAL LOW (ref 39.0–52.0)
Hemoglobin: 10.6 g/dL — ABNORMAL LOW (ref 13.0–17.0)
Lymphocytes Relative: 7 % — ABNORMAL LOW (ref 12–46)
Lymphs Abs: 1.3 10*3/uL (ref 0.7–4.0)
MCH: 26.7 pg (ref 26.0–34.0)
MCHC: 35.8 g/dL (ref 30.0–36.0)
MCV: 74.6 fL — ABNORMAL LOW (ref 78.0–100.0)
Monocytes Absolute: 1.3 10*3/uL — ABNORMAL HIGH (ref 0.1–1.0)
Monocytes Relative: 7 % (ref 3–12)
Neutro Abs: 15.8 10*3/uL — ABNORMAL HIGH (ref 1.7–7.7)
Neutrophils Relative %: 86 % — ABNORMAL HIGH (ref 43–77)
Platelets: 105 10*3/uL — ABNORMAL LOW (ref 150–400)
RBC: 3.97 MIL/uL — ABNORMAL LOW (ref 4.22–5.81)
RDW: 15.2 % (ref 11.5–15.5)
WBC: 18.4 10*3/uL — ABNORMAL HIGH (ref 4.0–10.5)

## 2012-12-19 LAB — LACTIC ACID, PLASMA: Lactic Acid, Venous: 4.2 mmol/L — ABNORMAL HIGH (ref 0.5–2.2)

## 2012-12-19 LAB — POCT I-STAT, CHEM 8
Calcium, Ion: 1.02 mmol/L — ABNORMAL LOW (ref 1.13–1.30)
Chloride: 102 mEq/L (ref 96–112)
HCT: 33 % — ABNORMAL LOW (ref 39.0–52.0)
Hemoglobin: 11.2 g/dL — ABNORMAL LOW (ref 13.0–17.0)
TCO2: 29 mmol/L (ref 0–100)

## 2012-12-19 LAB — URINE MICROSCOPIC-ADD ON

## 2012-12-19 MED ORDER — LOPERAMIDE HCL 2 MG PO CAPS
4.0000 mg | ORAL_CAPSULE | ORAL | Status: DC | PRN
Start: 2012-12-19 — End: 2012-12-20
  Administered 2012-12-19: 4 mg via ORAL
  Filled 2012-12-19: qty 2

## 2012-12-19 MED ORDER — POTASSIUM CHLORIDE CRYS ER 20 MEQ PO TBCR
40.0000 meq | EXTENDED_RELEASE_TABLET | Freq: Once | ORAL | Status: AC
  Administered 2012-12-19: 40 meq via ORAL
  Filled 2012-12-19: qty 2

## 2012-12-19 MED ORDER — SODIUM CHLORIDE 0.9 % IV SOLN
INTRAVENOUS | Status: DC
  Administered 2012-12-19: 23:00:00 via INTRAVENOUS

## 2012-12-19 MED ORDER — SODIUM CHLORIDE 0.9 % IV BOLUS (SEPSIS)
2000.0000 mL | Freq: Once | INTRAVENOUS | Status: AC
  Administered 2012-12-19: 2000 mL via INTRAVENOUS

## 2012-12-19 MED ORDER — DEXTROSE 5 % IV SOLN
1.0000 g | Freq: Once | INTRAVENOUS | Status: AC
  Administered 2012-12-19: 1 g via INTRAVENOUS
  Filled 2012-12-19: qty 10

## 2012-12-19 NOTE — H&P (Signed)
PCP:   Thora Lance, MD   Chief Complaint:  Diarrhea, sent by pcp for abnormal labs  HPI: 67 yo male with h/o prostate cancer, htn has been having 5 days of diarrhea nonbloody and dysuria.  Went to see his pcp today, ua was cked and dx with uti given script for cipro and some lab work was done.  Later he was called by pcp to come to ED for acute renal failure.  Over the last month his cr went from normal and is now over 4 with mildly low k.  He again has been having diarrhea and uti symptoms for 5 days.  No n/v.  No abd pain.  No fevers.  No hematuria.  His bp was borderline low sbp 90 on arrival, he is receiving his second liter ivf and sbp is some improved now.  Review of Systems:  Positive and negative as per HPI otherwise all other systems are negative  Past Medical History: Past Medical History  Diagnosis Date  . Prostate cancer   . Hypertension   . Obesity   . Sleep apnea   . Osteoarthritis   . DVT (deep venous thrombosis) 08/2008  . 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 Prostatectomy  02/2000    Medications: Prior to Admission medications   Medication Sig Start Date End Date Taking? Authorizing Douglas Rooks  abiraterone Acetate (ZYTIGA) 250 MG tablet Take 4 tablets (1,000 mg total) by mouth daily. Take on an empty stomach 1 hour before or 2 hours after a meal 11/30/12  Yes Myrtis Ser, NP  amLODipine (NORVASC) 10 MG tablet Take 10 mg by mouth daily.     Yes Historical Saveah Bahar, MD  aspirin 325 MG tablet Take 650 mg by mouth daily.   Yes Historical Shandon Matson, MD  aspirin 81 MG tablet Take 81 mg by mouth daily.     Yes Historical Kemuel Buchmann, MD  calcium carbonate (OS-CAL) 1250 MG chewable tablet Chew 2 tablets by mouth daily.   Yes Historical Tyronda Vizcarrondo, MD  Calcium Carbonate Antacid (ALKA-SELTZER ANTACID PO) Take 2 tablets by mouth 2 (two) times daily as needed (for upset stomach).   Yes Historical Quince Santana, MD  ciprofloxacin  (CIPRO) 500 MG tablet Take 500 mg by mouth 2 (two) times daily. Started 12/19/12, for 10 days, ending 12/28/12.   Yes Historical Saryah Loper, MD  leuprolide (LUPRON) 30 MG injection Inject 30 mg into the muscle every 4 (four) months.     Yes Historical Vitalia Stough, MD  losartan-hydrochlorothiazide (HYZAAR) 100-12.5 MG per tablet Take 1 tablet by mouth daily.     Yes Historical Leilanny Fluitt, MD  predniSONE (DELTASONE) 5 MG tablet Take 1 tablet (5 mg total) by mouth daily. 04/17/12  Yes Benjiman Core, MD    Allergies:  No Known Allergies  Social History:  reports that he has never smoked. He does not have any smokeless tobacco history on file. He reports that  drinks alcohol. His drug history is not on file.  Family History: negative  Physical Exam: Filed Vitals:   12/19/12 2130 12/19/12 2200 12/19/12 2215 12/19/12 2230  BP: 89/55 101/50 120/63 125/65  Pulse: 74 83 95 90  Temp:      TempSrc:      Resp: 20 22 24 30   SpO2: 100% 98% 94% 92%   General appearance: alert, cooperative and no distress Head: Normocephalic, without obvious abnormality, atraumatic Eyes: negative Nose: Nares normal. Septum midline. Mucosa normal. No drainage or sinus  tenderness. Throat: lips, mucosa, and tongue normal; teeth and gums normal Lungs: clear to auscultation bilaterally Heart: regular rate and rhythm, S1, S2 normal, no murmur, click, rub or gallop Abdomen: soft, non-tender; bowel sounds normal; no masses,  no organomegaly Extremities: extremities normal, atraumatic, no cyanosis or edema Pulses: 2+ and symmetric Skin: Skin color, texture, turgor normal. No rashes or lesions Neurologic: Grossly normal    Labs on Admission:   Recent Labs  12/19/12 1939  NA 141  K 3.0*  CL 102  GLUCOSE 159*  BUN 61*  CREATININE 4.90*    Recent Labs  12/19/12 1930 12/19/12 1939  WBC 18.4*  --   NEUTROABS 15.8*  --   HGB 10.6* 11.2*  HCT 29.6* 33.0*  MCV 74.6*  --   PLT 105*  --    Radiological Exams on  Admission: No results found.  Assessment/Plan 67 yo male with 5 days of diarrhea, dysuria, now with new acute renal failure on multiple antihypertensive medications  Principal Problem:   Acute renal failure Active Problems:   Prostate cancer   UTI (lower urinary tract infection)   Dehydration   Diarrhea   Hypertension   Sleep apnea  Renal failure likely due to combinations of diuretic/arb/infection/dehydration.  Place foley.  Urine cx.  Place on cipro.  Ivf.  Place in stepdown.  Ck lactic acid level and procalcitonin.  cdiff is pending but diarrhea likely related to uti.  cpap qhs for osa.  Hold bp meds.  He is on chronic steroids for his prostate cancer treatment, if bp does not respond to adequate ivf resussciatation may need stress dosing steroids.    DAVID,RACHAL A 12/19/2012, 11:04 PM

## 2012-12-19 NOTE — ED Notes (Signed)
Pt sent to ED ref. Abnormal lab values.  Creat. 4.79   BUN 69   K+3.0   WBC 18000.    Pt st's he has had diarrhea x's 5 days denies vomiting.

## 2012-12-19 NOTE — ED Notes (Signed)
Pt reports he sleeps with a cpap machine at night.

## 2012-12-19 NOTE — ED Provider Notes (Signed)
History    67 six-year-old male referred to the emergency room for abnormal renal function. Creatinine is 4.9. No diagnosed past history of renal dysfunction. Patient says has been having copious diarrhea for the past 5 days. Nonbloody. No fevers or chills. No nausea or vomiting. Sick contacts. Family members recently with diarrhea. No recent antibiotic use. No significant travel history.  CSN: [id number]  Arrival date & time 12/19/12  8657   First MD Initiated Contact with Patient 12/19/12 2019      Chief Complaint  Patient presents with  . Abnormal Lab    (Consider location/radiation/quality/duration/timing/severity/associated sxs/prior treatment) HPI  Past Medical History  Diagnosis Date  . Prostate cancer   . Hypertension   . Obesity   . Sleep apnea   . Osteoarthritis   . DVT (deep venous thrombosis) 08/2008  . 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 Prostatectomy  02/2000    No family history on file.  History  Substance Use Topics  . Smoking status: Never Smoker   . Smokeless tobacco: Not on file  . Alcohol Use: Yes     Comment: beer sometimes      Review of Systems  All systems reviewed and negative, other than as noted in HPI.   Allergies  Review of patient's allergies indicates no known allergies.  Home Medications   Current Outpatient Rx  Name  Route  Sig  Dispense  Refill  . abiraterone Acetate (ZYTIGA) 250 MG tablet   Oral   Take 4 tablets (1,000 mg total) by mouth daily. Take on an empty stomach 1 hour before or 2 hours after a meal   120 tablet   1   . amLODipine (NORVASC) 10 MG tablet   Oral   Take 10 mg by mouth daily.           [name] aspirin 81 MG tablet   Oral   Take 81 mg by mouth daily.           . Calcium Carbonate-Vitamin D (CALCIUM + D PO)   Oral   Take by mouth daily.           [name] docusate-casanthranol (PERICOLACE) 100-30 MG per capsule   Oral   Take 1 capsule by mouth  daily as needed.           [name] leuprolide (LUPRON) 30 MG injection   Intramuscular   Inject 30 mg into the muscle every 4 (four) months.           [name] losartan-hydrochlorothiazide (HYZAAR) 100-12.5 MG per tablet   Oral   Take 1 tablet by mouth daily.           . predniSONE (DELTASONE) 5 MG tablet   Oral   Take 1 tablet (5 mg total) by mouth daily.   180 tablet   1     BP 90/51  Pulse 75  Temp(Src) 98.4 F (36.9 C) (Oral)  Resp 22  SpO2 97%  Physical Exam  Nursing note and vitals reviewed. Constitutional: He appears well-developed and well-nourished. No distress.  HENT:  Head: Normocephalic and atraumatic.  Eyes: Conjunctivae are normal. Right eye exhibits no discharge. Left eye exhibits no discharge.  Neck: Neck supple.  Cardiovascular: Normal rate, regular rhythm and normal heart sounds.  Exam reveals no gallop and no friction rub.   No murmur heard. Pulmonary/Chest: Effort normal and breath sounds normal. No respiratory distress.  Abdominal: Soft. He exhibits no  distension and no mass. There is tenderness. There is no rebound and no guarding.  Minimal epigastric tenderness. No rebound or guarding.  Musculoskeletal: He exhibits no edema and no tenderness.  Neurological: He is alert.  Skin: Skin is warm and dry.  Psychiatric: He has a normal mood and affect. His behavior is normal. Thought content normal.    ED Course  Procedures (including critical care time)  CRITICAL CARE Performed by: Raeford Razor   Total critical care time: 35 minutes  Critical care time was exclusive of separately billable procedures and treating other patients.  Critical care was necessary to treat or prevent imminent or life-threatening deterioration.  Critical care was time spent personally by me on the following activities: development of treatment plan with patient and/or surrogate as well as nursing, discussions with consultants, evaluation of patient's response to treatment,  examination of patient, obtaining history from patient or surrogate, ordering and performing treatments and interventions, ordering and review of laboratory studies, ordering and review of radiographic studies, pulse oximetry and re-evaluation of patient's condition.   Labs Reviewed  CBC WITH DIFFERENTIAL - Abnormal; Notable for the following:    WBC 18.4 (*)    RBC 3.97 (*)    Hemoglobin 10.6 (*)    HCT 29.6 (*)    MCV 74.6 (*)    Platelets 105 (*)    Neutrophils Relative 86 (*)    Lymphocytes Relative 7 (*)    Neutro Abs 15.8 (*)    Monocytes Absolute 1.3 (*)    All other components within normal limits  URINALYSIS, ROUTINE W REFLEX MICROSCOPIC - Abnormal; Notable for the following:    APPearance CLOUDY (*)    Hgb urine dipstick LARGE (*)    Protein, ur 30 (*)    Leukocytes, UA MODERATE (*)    All other components within normal limits  URINE MICROSCOPIC-ADD ON - Abnormal; Notable for the following:    Bacteria, UA MANY (*)    All other components within normal limits  POCT I-STAT, CHEM 8 - Abnormal; Notable for the following:    Potassium 3.0 (*)    BUN 61 (*)    Creatinine, Ser 4.90 (*)    Glucose, Bld 159 (*)    Calcium, Ion 1.02 (*)    Hemoglobin 11.2 (*)    HCT 33.0 (*)    All other components within normal limits  CLOSTRIDIUM DIFFICILE BY PCR  URINE CULTURE  URINE CULTURE   No results found.   1. ARF (acute renal failure)   2. Diarrhea   3. UTI (urinary tract infection)   4. Prostate cancer   5. Hypotension    MDM  67 year old male with diarrhea. Acute renal failure likely secondary to this. Patient no urinary complaints but urinalysis is consistent with UTI. Cultures sent. Dose of ceftriaxone. Patient is hypotensive. IV fluids. Per medication list, patient is on prednisone presumably chronically given amount prescribed. Patient is unclear why he is actually taking this. If patient does not respond to IV fluids we'll consider stress dose of steroids. Discussed  with hospitalist for admission.   10:18 PM BP normalized after 2L IVF.         Raeford Razor, MD 12/19/12 2308

## 2012-12-19 NOTE — ED Notes (Signed)
Pt reports he has had a UTI since Friday, chills/shakes and he went to pcp where they told him he has kidney failure. Pt c/o prostate pain d/t his prostate cancer. Pt reports he has some issues urinating, is unable to control his bladder. Pt reports he has had diarrhea since Friday. Pt in nad, ambulated to room, skin warm and dry, resp e/u.

## 2012-12-19 NOTE — ED Notes (Signed)
Victorino December (pt's wife) [phone] would like to be called with any updates.

## 2012-12-20 ENCOUNTER — Inpatient Hospital Stay (HOSPITAL_COMMUNITY): Payer: Medicare Other

## 2012-12-20 ENCOUNTER — Encounter (HOSPITAL_COMMUNITY): Payer: Self-pay

## 2012-12-20 DIAGNOSIS — R6521 Severe sepsis with septic shock: Secondary | ICD-10-CM | POA: Diagnosis present

## 2012-12-20 DIAGNOSIS — A419 Sepsis, unspecified organism: Principal | ICD-10-CM

## 2012-12-20 DIAGNOSIS — D696 Thrombocytopenia, unspecified: Secondary | ICD-10-CM | POA: Diagnosis present

## 2012-12-20 DIAGNOSIS — G4730 Sleep apnea, unspecified: Secondary | ICD-10-CM

## 2012-12-20 DIAGNOSIS — R6520 Severe sepsis without septic shock: Secondary | ICD-10-CM

## 2012-12-20 LAB — PROTIME-INR
INR: 1.42 (ref 0.00–1.49)
Prothrombin Time: 17 seconds — ABNORMAL HIGH (ref 11.6–15.2)

## 2012-12-20 LAB — CBC
MCV: 75.5 fL — ABNORMAL LOW (ref 78.0–100.0)
Platelets: 67 10*3/uL — ABNORMAL LOW (ref 150–400)
Platelets: 68 10*3/uL — ABNORMAL LOW (ref 150–400)
RBC: 3.37 MIL/uL — ABNORMAL LOW (ref 4.22–5.81)
RDW: 15.6 % — ABNORMAL HIGH (ref 11.5–15.5)
RDW: 15.4 % (ref 11.5–15.5)
WBC: 4 10*3/uL (ref 4.0–10.5)
WBC: 15.7 10*3/uL — ABNORMAL HIGH (ref 4.0–10.5)

## 2012-12-20 LAB — CARBOXYHEMOGLOBIN
Carboxyhemoglobin: 1.2 % (ref 0.5–1.5)
Methemoglobin: 1.3 % (ref 0.0–1.5)
O2 Saturation: 65.1 %
Total hemoglobin: 9.1 g/dL — ABNORMAL LOW (ref 13.5–18.0)

## 2012-12-20 LAB — CREATININE, SERUM
GFR calc Af Amer: 15 mL/min — ABNORMAL LOW (ref >90)
GFR calc non Af Amer: 13 mL/min — ABNORMAL LOW (ref >90)

## 2012-12-20 LAB — MAGNESIUM: Magnesium: 1.7 mg/dL (ref 1.5–2.5)

## 2012-12-20 LAB — URINALYSIS, ROUTINE W REFLEX MICROSCOPIC
Glucose, UA: NEGATIVE mg/dL
Ketones, ur: NEGATIVE mg/dL
Nitrite: NEGATIVE
Specific Gravity, Urine: 1.021 (ref 1.005–1.030)
pH: 5 (ref 5.0–8.0)

## 2012-12-20 LAB — BASIC METABOLIC PANEL
CO2: 21 mEq/L (ref 19–32)
CO2: 17 mEq/L — ABNORMAL LOW (ref 19–32)
Calcium: 7.9 mg/dL — ABNORMAL LOW (ref 8.4–10.5)
GFR calc Af Amer: 12 mL/min — ABNORMAL LOW (ref >90)
GFR calc non Af Amer: 10 mL/min — ABNORMAL LOW (ref >90)
GFR calc non Af Amer: 12 mL/min — ABNORMAL LOW (ref >90)
Glucose, Bld: 118 mg/dL — ABNORMAL HIGH (ref 70–99)
Potassium: 3.3 mEq/L — ABNORMAL LOW (ref 3.5–5.1)
Sodium: 144 mEq/L (ref 135–145)
Sodium: 144 mEq/L (ref 135–145)

## 2012-12-20 LAB — URINE CULTURE: Colony Count: 8000

## 2012-12-20 LAB — TSH: TSH: 0.908 u[IU]/mL (ref 0.350–4.500)

## 2012-12-20 LAB — URINE MICROSCOPIC-ADD ON

## 2012-12-20 LAB — GLUCOSE, CAPILLARY: Glucose-Capillary: 114 mg/dL — ABNORMAL HIGH (ref 70–99)

## 2012-12-20 LAB — LACTIC ACID, PLASMA: Lactic Acid, Venous: 3 mmol/L — ABNORMAL HIGH (ref 0.5–2.2)

## 2012-12-20 LAB — CLOSTRIDIUM DIFFICILE BY PCR: Toxigenic C. Difficile by PCR: NEGATIVE

## 2012-12-20 LAB — TYPE AND SCREEN: ABO/RH(D): A POS

## 2012-12-20 LAB — MRSA PCR SCREENING: MRSA by PCR: NEGATIVE

## 2012-12-20 LAB — APTT: aPTT: 39 seconds — ABNORMAL HIGH (ref 24–37)

## 2012-12-20 MED ORDER — ONDANSETRON HCL 4 MG/2ML IJ SOLN: 4.0000 mg | Freq: Three times a day (TID) | INTRAMUSCULAR | Status: AC | PRN

## 2012-12-20 MED ORDER — PANTOPRAZOLE SODIUM 40 MG IV SOLR
40.0000 mg | INTRAVENOUS | Status: DC
  Administered 2012-12-20 – 2012-12-22 (×3): 40 mg via INTRAVENOUS
  Filled 2012-12-20 (×4): qty 40

## 2012-12-20 MED ORDER — ONDANSETRON HCL 4 MG/2ML IJ SOLN: 4.0000 mg | Freq: Four times a day (QID) | INTRAMUSCULAR | Status: DC | PRN

## 2012-12-20 MED ORDER — SODIUM CHLORIDE 0.9 % IV SOLN: INTRAVENOUS | Status: DC

## 2012-12-20 MED ORDER — PREDNISONE 5 MG PO TABS
5.0000 mg | ORAL_TABLET | Freq: Every day | ORAL | Status: DC
  Filled 2012-12-20: qty 1

## 2012-12-20 MED ORDER — ENOXAPARIN SODIUM 30 MG/0.3ML ~~LOC~~ SOLN
30.0000 mg | SUBCUTANEOUS | Status: DC
  Administered 2012-12-20: 30 mg via SUBCUTANEOUS
  Filled 2012-12-20 (×2): qty 0.3

## 2012-12-20 MED ORDER — SODIUM CHLORIDE 0.9 % IV BOLUS (SEPSIS)
1000.0000 mL | INTRAVENOUS | Status: DC | PRN
  Administered 2012-12-20: 1000 mL via INTRAVENOUS

## 2012-12-20 MED ORDER — PIPERACILLIN-TAZOBACTAM 3.375 G IVPB 30 MIN
3.3750 g | INTRAVENOUS | Status: AC
  Administered 2012-12-20: 3.375 g via INTRAVENOUS
  Filled 2012-12-20: qty 50

## 2012-12-20 MED ORDER — PIPERACILLIN-TAZOBACTAM 3.375 G IVPB
3.3750 g | Freq: Three times a day (TID) | INTRAVENOUS | Status: DC
  Administered 2012-12-20: 3.375 g via INTRAVENOUS
  Filled 2012-12-20 (×3): qty 50

## 2012-12-20 MED ORDER — SODIUM CHLORIDE 0.9 % IV BOLUS (SEPSIS)
1000.0000 mL | Freq: Once | INTRAVENOUS | Status: AC
  Administered 2012-12-20: 1000 mL via INTRAVENOUS

## 2012-12-20 MED ORDER — SODIUM CHLORIDE 0.9 % IV SOLN
INTRAVENOUS | Status: DC
  Administered 2012-12-20 – 2012-12-21 (×2): via INTRAVENOUS

## 2012-12-20 MED ORDER — HEPARIN SODIUM (PORCINE) 5000 UNIT/ML IJ SOLN
5000.0000 [IU] | Freq: Three times a day (TID) | INTRAMUSCULAR | Status: DC
  Administered 2012-12-20: 5000 [IU] via SUBCUTANEOUS
  Filled 2012-12-20 (×4): qty 1

## 2012-12-20 MED ORDER — METRONIDAZOLE IN NACL 5-0.79 MG/ML-% IV SOLN
500.0000 mg | Freq: Three times a day (TID) | INTRAVENOUS | Status: DC
  Administered 2012-12-20 – 2012-12-21 (×3): 500 mg via INTRAVENOUS
  Filled 2012-12-20 (×5): qty 100

## 2012-12-20 MED ORDER — POTASSIUM CHLORIDE 10 MEQ/100ML IV SOLN
10.0000 meq | INTRAVENOUS | Status: AC
  Administered 2012-12-20 (×6): 10 meq via INTRAVENOUS
  Filled 2012-12-20: qty 400
  Filled 2012-12-20: qty 100

## 2012-12-20 MED ORDER — SODIUM CHLORIDE 0.9 % IV SOLN
INTRAVENOUS | Status: AC
  Administered 2012-12-20: via INTRAVENOUS

## 2012-12-20 MED ORDER — ONDANSETRON HCL 4 MG PO TABS: 4.0000 mg | ORAL_TABLET | Freq: Four times a day (QID) | ORAL | Status: DC | PRN

## 2012-12-20 MED ORDER — SODIUM CHLORIDE 0.9 % IV BOLUS (SEPSIS): 1000.0000 mL | INTRAVENOUS | Status: DC | PRN

## 2012-12-20 MED ORDER — ACETAMINOPHEN 325 MG PO TABS
650.0000 mg | ORAL_TABLET | Freq: Four times a day (QID) | ORAL | Status: DC | PRN
  Administered 2012-12-20: 650 mg via ORAL
  Filled 2012-12-20: qty 2

## 2012-12-20 MED ORDER — PIPERACILLIN-TAZOBACTAM IN DEX 2-0.25 GM/50ML IV SOLN
2.2500 g | Freq: Four times a day (QID) | INTRAVENOUS | Status: DC
  Administered 2012-12-20 – 2012-12-21 (×3): 2.25 g via INTRAVENOUS
  Filled 2012-12-20 (×7): qty 50

## 2012-12-20 MED ORDER — HYDROCORTISONE SOD SUCCINATE 100 MG IJ SOLR
50.0000 mg | Freq: Three times a day (TID) | INTRAMUSCULAR | Status: DC
  Administered 2012-12-20 – 2012-12-23 (×10): 50 mg via INTRAVENOUS
  Filled 2012-12-20 (×13): qty 1

## 2012-12-20 MED ORDER — NOREPINEPHRINE BITARTRATE 1 MG/ML IJ SOLN
2.0000 ug/min | INTRAVENOUS | Status: DC
  Administered 2012-12-20: 18 ug/min via INTRAVENOUS
  Administered 2012-12-20: 5 ug/min via INTRAVENOUS
  Administered 2012-12-21: 4 ug/min via INTRAVENOUS
  Filled 2012-12-20 (×4): qty 8

## 2012-12-20 MED ORDER — CIPROFLOXACIN IN D5W 400 MG/200ML IV SOLN
400.0000 mg | Freq: Two times a day (BID) | INTRAVENOUS | Status: DC
  Administered 2012-12-20: 400 mg via INTRAVENOUS
  Filled 2012-12-20 (×2): qty 200

## 2012-12-20 NOTE — Progress Notes (Signed)
ANTICOAGULATION CONSULT NOTE - Initial Consult  Pharmacy Consult for lovenox Indication: VTE prophylaxis  No Known Allergies  Patient Measurements: Height: 5\' 8"  (172.7 cm) Weight: 235 lb 14.3 oz (107 kg) IBW/kg (Calculated) : 68.4  Vital Signs: Temp: 97.9 F (36.6 C) (03/12 1543) Temp src: Oral (03/12 1543) BP: 94/58 mmHg (03/12 1530) Pulse Rate: 82 (03/12 1530)  Labs:  Recent Labs  12/19/12 1930  12/19/12 1939 12/20/12 0014 12/20/12 0700 12/20/12 1026 12/20/12 1315  HGB 10.6*  --  11.2* 11.2* 9.0*  --   --   HCT 29.6*  --  33.0* 31.8* 26.1*  --   --   PLT 105*  --   --  67* 68*  --   --   APTT  --   --   --   --   --  39*  --   LABPROT  --   --   --   --   --  17.0*  --   INR  --   --   --   --   --  1.42  --   CREATININE  --   < > 4.90* 4.29* 5.19*  --  4.64*  TROPONINI  --   --   --  <0.30  --   --   --   < > = values in this interval not displayed.  Estimated Creatinine Clearance: 18.6 ml/min (by C-G formula based on Cr of 4.64).   Medical History: Past Medical History  Diagnosis Date  . Prostate cancer   . Hypertension   . Obesity   . Sleep apnea   . Osteoarthritis   . DVT (deep venous thrombosis) 08/2008  . 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 [COMPLETED] cefTRIAXone (ROCEPHIN)  IV  1 g Intravenous Once  . enoxaparin (LOVENOX) injection  30 mg Subcutaneous Q24H  . hydrocortisone sod succinate (SOLU-CORTEF) injection  50 mg Intravenous Q8H  . metronidazole  500 mg Intravenous Q8H  . piperacillin-tazobactam (ZOSYN)  IV  2.25 g Intravenous Q6H  . [COMPLETED] piperacillin-tazobactam  3.375 g Intravenous STAT  . potassium chloride  10 mEq Intravenous Q1 Hr x 6  . [COMPLETED] potassium chloride  40 mEq Oral Once  . [COMPLETED] sodium chloride  1,000 mL Intravenous Once  . [COMPLETED] sodium chloride  1,000 mL Intravenous Once  . [COMPLETED] sodium chloride  2,000 mL Intravenous Once  . [DISCONTINUED] ciprofloxacin  400 mg  Intravenous Q12H  . [DISCONTINUED] heparin  5,000 Units Subcutaneous Q8H  . [DISCONTINUED] piperacillin-tazobactam (ZOSYN)  IV  3.375 g Intravenous Q8H  . [DISCONTINUED] predniSONE  5 mg Oral Daily    Assessment: 67 y.o male admitted with pyelonephritis, septic shock and declining renal function to start leovnox for DVT prophylaxis. Scr 4.64 est crcl 18.6 ml/min, no noted bleeding. H/H 9.0/26.1 and thrombocytopenia, PLT 68 attributed to sepsis, will follow trend closely.   Plan:  1. Lovenox 30 mg [place] q24 2. Monitor trend in platelets 3. Monitor s/sx of bleeding   Bola A. Wandra Feinstein D Clinical Pharmacist Pager:[phone] Phone [phone] 12/20/2012 4:05 PM

## 2012-12-20 NOTE — Progress Notes (Signed)
[facility] PROGRESS NOTE  [name] ZOX:[id number] DOB: May 20, 1946 DOA: 12/19/2012 PCP: Thora Lance, MD Primary Oncology - [facility]   Assessment/Plan: 1. Severe sepsis - as proven by leukocytosis, elevated lactate, hypotension, tachycardia, T 103. Patient received a total of 3,000 cc through the night. Currently ordered 1000 cc more. May require pressors.  Source is pyelonephritis - proven by  severe RCVA tenderness and urine looking like frank pus. Abx changed to Zosyn for better coverage 12/20/12. Blood Cx , urine cx pending  2. Acute renal failure - from sepsis and dehydration. Very little UO through the night - creatinine rising. Will need hemodynamic monitoring. CVP, A line. Will need renal consult if UO does not improve. [place] pending  3. Thrombocytopenia: - probably secondary to sepsis  4. Hypokalemia: - severe -  Probable from diarrhea. Continue to replete and recheck. Check magnesium as well  5. Diarrhea : - none since being admitted . Empiric Flagyl iv until we establish he does not have c diff  6.   Prostate cancer stage IV - Status post prostatectomy done in May 2001. He had T3 N1 disease. PSA nadir to 0. The patient developed biochemical relapse and received prostatic bed radiation.  The patient received Lupron with Casodex due to rising PSA. The patient subsequently developed castration-resistant disease.  Patient treated with Casodex withdrawal and subsequently PSA rose to 18.  Patient with Provenge immunotherapy completed in July 2011. He is on ketoconazole and prednisone since December 2011. He is on Lupron 30 mg every 4 months. Zytiga also added 10/26/2012  7. OSA - on CPAP 8. Chronic steroid use - started iv Hydrocortisone 50 mg iv q 8 hrs stress dose on 12/20/12      Code Status: full  Family Communication: patient   Disposition Plan: ICU   Consultants:  CCM  Procedures:    Antibiotics:  Rocephin 1 gram 3/11-3/12   Zosyn 3/12  Flagyl  3/12  HPI/Subjective: Alert, without pain, feels weak, thirsty   Objective: Filed Vitals:   12/20/12 0500 12/20/12 0600 12/20/12 0623 12/20/12 0850  BP: 102/58  74/46 72/46  Pulse: 109  96 90  Temp:  100.3 F (37.9 C)  97.4 F (36.3 C)  TempSrc:  Oral  Axillary  Resp: 39  24 24  Height:      Weight:      SpO2: 98%  99% 97%   Patient Vitals for the past 24 hrs:  BP Temp Temp src Pulse Resp SpO2 Height Weight  12/20/12 0850 72/46 mmHg 97.4 F (36.3 C) Axillary 90 24 97 % - -  12/20/12 0623 74/46 mmHg - - 96 24 99 % - -  12/20/12 0600 - 100.3 F (37.9 C) Oral - - - - -  12/20/12 0500 102/58 mmHg - - 109 39 98 % - -  12/20/12 0455 93/52 mmHg - - 111 - - - -  12/20/12 0453 78/51 mmHg - - 112 45 98 % - -  12/20/12 0400 98/53 mmHg - - 105 34 100 % - -  12/20/12 0343 101/50 mmHg 103.1 F (39.5 C) - 106 31 99 % - -  12/20/12 0300 100/45 mmHg - - 104 39 100 % - -  12/20/12 0219 95/57 mmHg - - 108 37 94 % - -  12/20/12 0000 - - - - - - 5\' 8"  (1.727 m) 107 kg (235 lb 14.3 oz)  12/19/12 2312 95/69 mmHg 99.2 F (37.3 C) Oral 98 21 95 % - -  12/19/12 2300 89/54 mmHg - - 90 19 96 % - -  12/19/12 2245 102/56 mmHg - - 101 20 95 % - -  12/19/12 2230 125/65 mmHg - - 90 30 92 % - -  12/19/12 2215 120/63 mmHg - - 95 24 94 % - -  12/19/12 2200 101/50 mmHg - - 83 22 98 % - -  12/19/12 2130 89/55 mmHg - - 74 20 100 % - -  12/19/12 2104 - - - 75 22 97 % - -  12/19/12 1932 90/51 mmHg 98.4 F (36.9 C) Oral 81 16 94 % - -  12/19/12 1920 92/49 mmHg 99.1 F (37.3 C) Oral 80 24 94 % - -     Intake/Output Summary (Last 24 hours) at 12/20/12 0915 Last data filed at 12/20/12 0600  Gross per 24 hour  Intake    950 ml  Output    145 ml  Net    805 ml   Filed Weights   12/20/12 0000  Weight: 107 kg (235 lb 14.3 oz)    Exam:   General:  axox3  Cardiovascular: RRR, no M,r,g   Respiratory: clear ant fields, hyperinflation of supraclav fosae bilat   Abdomen: distended , soft, positive  suprapubic tenderness and R cva penderness   Musculoskeletal: intact    Data Reviewed: Basic Metabolic Panel:  Recent Labs Lab 12/19/12 1939 12/20/12 0014 12/20/12 0700  NA 141  --  144  K 3.0*  --  2.7*  CL 102  --  105  CO2  --   --  21  GLUCOSE 159*  --  95  BUN 61*  --  69*  CREATININE 4.90* 4.29* 5.19*  CALCIUM  --   --  7.9*   Liver Function Tests: No results found for this basename: AST, ALT, ALKPHOS, BILITOT, PROT, ALBUMIN,  in the last 168 hours No results found for this basename: LIPASE, AMYLASE,  in the last 168 hours No results found for this basename: AMMONIA,  in the last 168 hours CBC:  Recent Labs Lab 12/19/12 1930 12/19/12 1939 12/20/12 0014 12/20/12 0700  WBC 18.4*  --  4.0 15.7*  NEUTROABS 15.8*  --   --   --   HGB 10.6* 11.2* 11.2* 9.0*  HCT 29.6* 33.0* 31.8* 26.1*  MCV 74.6*  --  75.5* 77.4*  PLT 105*  --  67* 68*   Cardiac Enzymes:  Recent Labs Lab 12/20/12 0014  TROPONINI <0.30   BNP (last 3 results) No results found for this basename: PROBNP,  in the last 8760 hours CBG: No results found for this basename: GLUCAP,  in the last 168 hours  Recent Results (from the past 240 hour(s))  MRSA PCR SCREENING     Status: None   Collection Time    12/20/12 12:01 AM      Result Value Range Status   MRSA by PCR NEGATIVE  NEGATIVE Final   Comment:            The GeneXpert MRSA Assay (FDA     approved for NASAL specimens     only), is one component of a     comprehensive MRSA colonization     surveillance program. It is not     intended to diagnose MRSA     infection nor to guide or     monitor treatment for     MRSA infections.     Studies: No results found.  Scheduled Meds: . heparin  5,000 Units Subcutaneous Q8H  . hydrocortisone sod succinate (SOLU-CORTEF) injection  50 mg Intravenous Q8H  . piperacillin-tazobactam (ZOSYN)  IV  3.375 g Intravenous Q8H  . potassium chloride  10 mEq Intravenous Q1 Hr x 6  . sodium chloride   1,000 mL Intravenous Once   Continuous Infusions: . sodium chloride    . sodium chloride 125 mL/hr at 12/20/12 0018  . sodium chloride 150 mL/hr at 12/20/12 9604    Principal Problem:   Acute renal failure Active Problems:   Prostate cancer   UTI (lower urinary tract infection)   Dehydration   Diarrhea   Hypertension   Sleep apnea   Thrombocytopenia, unspecified    Time spent: 1 hour of critical care time     [facility]  [facility] Pager [phone]. If 7PM-7AM, please contact night-coverage at [email or web address], password [facility] 12/20/2012, 9:15 AM  LOS: 1 day

## 2012-12-20 NOTE — Progress Notes (Signed)
Utilization Review Completed. 12/20/2012  

## 2012-12-20 NOTE — Progress Notes (Signed)
Report called to receiving RN on unit 2100. Patient made aware of transfer to Rm 2116. Patient transferred via bed to room 2116.

## 2012-12-20 NOTE — Procedures (Signed)
Central Venous Catheter Insertion Procedure Note LUVERN MCISAAC [id number] 20-Apr-1946  Procedure: Insertion of Central Venous Catheter Indications: Assessment of intravascular volume, Drug and/or fluid administration and Frequent blood sampling  Procedure Details Consent: Risks of procedure as well as the alternatives and risks of each were explained to the (patient/caregiver).  Consent for procedure obtained. Time Out: Verified patient identification, verified procedure, site/side was marked, verified correct patient position, special equipment/implants available, medications/allergies/relevent history reviewed, required imaging and test results available.  Performed  Maximum sterile technique was used including antiseptics, cap, gloves, gown, hand hygiene, mask and sheet. Skin prep: Chlorhexidine; local anesthetic administered A antimicrobial bonded/coated triple lumen catheter was placed in the right internal jugular vein using the Seldinger technique.  Evaluation Blood flow good Complications: No apparent complications Patient did tolerate procedure well. Chest X-ray ordered to verify placement.  CXR: pending.  U/S used in placement.  YACOUB,WESAM 12/20/2012, 1:25 PM

## 2012-12-20 NOTE — Progress Notes (Signed)
ANTIBIOTIC CONSULT NOTE - FOLLOW UP  Pharmacy Consult for Zosyn Indication: rule out sepsis  No Known Allergies  Patient Measurements: Height: 5\' 8"  (172.7 cm) Weight: 235 lb 14.3 oz (107 kg) IBW/kg (Calculated) : 68.4  Vital Signs: Temp: 97.4 F (36.3 C) (03/12 0850) Temp src: Axillary (03/12 0850) BP: 72/46 mmHg (03/12 0850) Pulse Rate: 90 (03/12 0850) Intake/Output from previous day: 03/11 0701 - 03/12 0700 In: 950 [I.V.:750; IV Piggyback:200] Out: 145 [Urine:145] Intake/Output from this shift:    Labs:  Recent Labs  12/19/12 1930 12/19/12 1939 12/20/12 0014 12/20/12 0700  WBC 18.4*  --  4.0 15.7*  HGB 10.6* 11.2* 11.2* 9.0*  PLT 105*  --  67* 68*  CREATININE  --  4.90* 4.29* 5.19*   Estimated Creatinine Clearance: 16.6 ml/min (by C-G formula based on Cr of 5.19). No results found for this basename: VANCOTROUGH, Leodis Binet, VANCORANDOM, GENTTROUGH, GENTPEAK, GENTRANDOM, TOBRATROUGH, TOBRAPEAK, TOBRARND, AMIKACINPEAK, AMIKACINTROU, AMIKACIN,  in the last 72 hours   Microbiology: Recent Results (from the past 720 hour(s))  MRSA PCR SCREENING     Status: None   Collection Time    12/20/12 12:01 AM      Result Value Range Status   MRSA by PCR NEGATIVE  NEGATIVE Final   Comment:            The GeneXpert MRSA Assay (FDA     approved for NASAL specimens     only), is one component of a     comprehensive MRSA colonization     surveillance program. It is not     intended to diagnose MRSA     infection nor to guide or     monitor treatment for     MRSA infections.    Anti-infectives   Start     Dose/Rate Route Frequency Ordered Stop   12/20/12 1000  metroNIDAZOLE (FLAGYL) IVPB 500 mg     500 mg 100 mL/hr over 60 Minutes Intravenous Every 8 hours 12/20/12 0920     12/20/12 0800  piperacillin-tazobactam (ZOSYN) IVPB 3.375 g     3.375 g 12.5 mL/hr over 240 Minutes Intravenous 3 times per day 12/20/12 0735     12/20/12 0100  ciprofloxacin (CIPRO) IVPB 400 mg   Status:  Discontinued     400 mg 200 mL/hr over 60 Minutes Intravenous Every 12 hours 12/20/12 0011 12/20/12 0730   12/19/12 2130  cefTRIAXone (ROCEPHIN) 1 g in dextrose 5 % 50 mL IVPB     1 g 100 mL/hr over 30 Minutes Intravenous  Once 12/19/12 2125 12/19/12 2232      Assessment: With declining renal function will adjust Zosyn dose accordingly for sepsis and pyelonephritis.  Plan:  Zosyn 2.25gm IV q6h Monitor renal function and available micro data  Estella Husk, Pharm.D., BCPS Clinical Pharmacist  Phone [phone] Pager [phone] 12/20/2012, 9:53 AM

## 2012-12-20 NOTE — Progress Notes (Signed)
This note also relates to the following rows which could not be included: Pulse Rate - Cannot attach notes to unvalidated device data Resp - Cannot attach notes to unvalidated device data BP - Cannot attach notes to unvalidated device data SpO2 - Cannot attach notes to unvalidated device data    12/20/12 1930  BiPAP/CPAP/SIPAP  BiPAP/CPAP/SIPAP Pt Type Adult  Mask Type Full face mask  Mask Size Large  Respiratory Rate 22 breaths/min  IPAP 15 cmH20  EPAP 15 cmH2O  Flow Rate 4 lpm  BiPAP/CPAP/SIPAP CPAP  Patient Home Equipment No  Auto Titrate No  Patient requested to wear his CPAP early. RT placed patient on CPAP Full Face Mask with 4L Oxygen bleed in. He tolerates it very well at this time.

## 2012-12-20 NOTE — Progress Notes (Signed)
NP Craige Cotta notified that pt temp was 103.1 orally, and that patients output since Midnight only 20cc's via foley cath. New orders placed. Will continue to monitor pt. Adria Dill RN

## 2012-12-20 NOTE — Progress Notes (Signed)
ANTIBIOTIC CONSULT NOTE - INITIAL  Pharmacy Consult for Zosyn Indication: UTI  No Known Allergies  Patient Measurements: Height: 5\' 8"  (172.7 cm) Weight: 235 lb 14.3 oz (107 kg) IBW/kg (Calculated) : 68.4  Vital Signs: Temp: 100.3 F (37.9 C) (03/12 0600) Temp src: Oral (03/12 0600) BP: 74/46 mmHg (03/12 0623) Pulse Rate: 96 (03/12 0623) Intake/Output from previous day: 03/11 0701 - 03/12 0700 In: 950 [I.V.:750; IV Piggyback:200] Out: 145 [Urine:145] Intake/Output from this shift:    Labs:  Recent Labs  12/19/12 1930 12/19/12 1939 12/20/12 0014  WBC 18.4*  --  4.0  HGB 10.6* 11.2* 11.2*  PLT 105*  --  67*  CREATININE  --  4.90* 4.29*   Estimated Creatinine Clearance: 20.1 ml/min (by C-G formula based on Cr of 4.29). No results found for this basename: VANCOTROUGH, Leodis Binet, VANCORANDOM, GENTTROUGH, GENTPEAK, GENTRANDOM, TOBRATROUGH, TOBRAPEAK, TOBRARND, AMIKACINPEAK, AMIKACINTROU, AMIKACIN,  in the last 72 hours   Microbiology: Recent Results (from the past 720 hour(s))  MRSA PCR SCREENING     Status: None   Collection Time    12/20/12 12:01 AM      Result Value Range Status   MRSA by PCR NEGATIVE  NEGATIVE Final   Comment:            The GeneXpert MRSA Assay (FDA     approved for NASAL specimens     only), is one component of a     comprehensive MRSA colonization     surveillance program. It is not     intended to diagnose MRSA     infection nor to guide or     monitor treatment for     MRSA infections.    Medical History: Past Medical History  Diagnosis Date  . Prostate cancer   . Hypertension   . Obesity   . Sleep apnea   . Osteoarthritis   . DVT (deep venous thrombosis) 08/2008  . 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 [COMPLETED] cefTRIAXone (ROCEPHIN)  IV  1 g Intravenous Once  . heparin  5,000 Units Subcutaneous Q8H  . hydrocortisone sod succinate (SOLU-CORTEF) injection  50 mg Intravenous Q8H  . [COMPLETED]  potassium chloride  40 mEq Oral Once  . sodium chloride  1,000 mL Intravenous Once  . [COMPLETED] sodium chloride  2,000 mL Intravenous Once  . [DISCONTINUED] ciprofloxacin  400 mg Intravenous Q12H  . [DISCONTINUED] predniSONE  5 mg Oral Daily   Assessment: 67 yo male with UTI for empiric antibiotics  Plan:  Zosyn 3.375 g IV q8h  [name] 12/20/2012,7:31 AM

## 2012-12-20 NOTE — Consult Note (Signed)
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 CT in jan 2014 showed stable disease but has had rising PSA (currently on Zytiga, Prednisone and Lupron). He was admitted on 3/11 w/ dx of pyelonephritis. Transferred to the ICU on 3/12 in septic shock.   SIGNIFICANT EVENTS / STUDIES:  Renal US 3/12>>> CT abd/pelvis >>>needs to be ordered, holding d/t shock.   LINES / TUBES: Right IJ CVL 3/12>>>  CULTURES: UC 3/11>>> BC 3/12>>> C diff 3/12>>>  ANTIBIOTICS: Flagyl 3/12>>> Zosyn 3/12>>>  HISTORY OF PRESENT ILLNESS:    67 yo male with h/o prostate cancer, htn, admitted on 3/11 w/ CC:  5 days of diarrhea nonbloody and dysuria. Went to see his pcp today, ua was cked and dx with uti given script for cipro and some lab work was done. Later he was called by pcp to come to ED for acute renal failure. Over the last month his cr went from normal and is now over 4 with mildly low k. No abd pain. No fevers. No hematuria. His bp was borderline low sbp 90 on arrival, he is receiving his second liter ivf and sbp is some improved now. He was admitted w/ working dx of pyelonephritis and sepsis. He was admitted to the med surg unit, treated w/ IVFs and antibiotics. As well as holding his antihypertensives. He had persistent and progressive hypotension over the PM hours he was transferred to the ICU the am of 3/12 in shock.    PAST MEDICAL HISTORY :  Past Medical History  Diagnosis Date  . Prostate cancer   . Hypertension   . Obesity   . Sleep apnea   . Osteoarthritis   . DVT (deep venous thrombosis) 08/2008  . 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 Prostatectomy  02/2000   Prior to Admission  medications   Medication Sig Start Date End Date Taking? Authorizing Provider  abiraterone Acetate (ZYTIGA) 250 MG tablet Take 4 tablets (1,000 mg total) by mouth daily. Take on an empty stomach 1 hour before or 2 hours after a meal 11/30/12  Yes Myrtis Ser, NP  amLODipine (NORVASC) 10 MG tablet Take 10 mg by mouth daily.     Yes Historical Provider, MD  aspirin 325 MG tablet Take 650 mg by mouth daily.   Yes Historical Provider, MD  aspirin 81 MG tablet Take 81 mg by mouth daily.     Yes Historical Provider, MD  calcium carbonate (OS-CAL) 1250 MG chewable tablet Chew 2 tablets by mouth daily.   Yes Historical Provider, MD  Calcium Carbonate Antacid (ALKA-SELTZER ANTACID PO) Take 2 tablets by mouth 2 (two) times daily as needed (for upset stomach).   Yes Historical Provider, MD  ciprofloxacin (CIPRO) 500 MG tablet Take 500 mg by mouth 2 (two) times daily. Started 12/19/12, for 10 days, ending 12/28/12.   Yes Historical Provider, MD  leuprolide (LUPRON) 30 MG injection Inject 30 mg into the muscle every 4 (four) months.     Yes Historical Provider, MD  losartan-hydrochlorothiazide (HYZAAR) 100-12.5 MG per tablet Take 1 tablet by mouth daily.     Yes Historical Provider, MD  predniSONE (DELTASONE) 5 MG tablet Take  1 tablet (5 mg total) by mouth daily. 04/17/12  Yes Benjiman Core, MD   No Known Allergies  FAMILY HISTORY:  No family history on file. SOCIAL HISTORY:  reports that he has never smoked. He does not have any smokeless tobacco history on file. He reports that  drinks alcohol. His drug history is not on file.  REVIEW OF SYSTEMS:   Review of Systems  Constitutional: No weight loss, gain, night sweats, Fevers, chills, fatigue .  HEENT: No headaches, visual changes, Difficulty swallowing, Tooth/dental problems, or Sore throat,  No sneezing, itching, ear ache, nasal congestion, post nasal drip, no visual complaints CV: No chest pain, Orthopnea, PND, swelling in lower extremities,  dizziness, palpitations, syncope.  GI No heartburn, indigestion, abdominal pain, nausea, vomiting, diarrhea, change in bowel habits, loss of appetite, bloody stools.  Resp: No cough, No coughing up of blood. No change in color of mucus. No wheezing. Mild dyspnea  Skin: no rash or itching or icterus GU:  dysuria, change in color of urine, no urgency or frequency. No flank pain, no hematuria  MS: No joint pain or swelling. No decreased range of motion  Psych: No change in mood or affect. No depression or anxiety.  Neuro: no difficulty with speech, weakness, numbness, ataxia    SUBJECTIVE:   VITAL SIGNS: Temp:  [97.4 F (36.3 C)-103.1 F (39.5 C)] 97.4 F (36.3 C) (03/12 0850) Pulse Rate:  [74-112] 90 (03/12 0850) Resp:  [16-45] 24 (03/12 0850) BP: (72-125)/(45-69) 72/46 mmHg (03/12 0850) SpO2:  [92 %-100 %] 97 % (03/12 0850) Weight:  [107 kg (235 lb 14.3 oz)] 107 kg (235 lb 14.3 oz) (03/12 0000) HEMODYNAMICS:   VENTILATOR SETTINGS:   INTAKE / OUTPUT: Intake/Output     03/11 0701 - 03/12 0700 03/12 0701 - 03/13 0700   I.V. (mL/kg) 750 (7) 1300 (12.1)   IV Piggyback 200    Total Intake(mL/kg) 950 (8.9) 1300 (12.1)   Urine (mL/kg/hr) 145    Total Output 145     Net +805 +1300          PHYSICAL EXAMINATION: General:  No acute distress.  Neuro:  Awake, no focal def  HEENT:  [place], no JVD Cardiovascular:  rrr Lungs:  Decreased in bases.  Abdomen:  Soft, non-tender + bowel sounds  Musculoskeletal:  Intact  Skin:  + lower extremity edema   LABS:  Recent Labs Lab 12/19/12 1939 12/20/12 0014 12/20/12 0700  NA 141  --  144  K 3.0*  --  2.7*  CL 102  --  105  CO2  --   --  21  BUN 61*  --  69*  CREATININE 4.90* 4.29* 5.19*  GLUCOSE 159*  --  95    Recent Labs Lab 12/19/12 1930 12/19/12 1939 12/20/12 0014 12/20/12 0700  HGB 10.6* 11.2* 11.2* 9.0*  HCT 29.6* 33.0* 31.8* 26.1*  WBC 18.4*  --  4.0 15.7*  PLT 105*  --  67* 68*   ABG    Component Value Date/Time    PHART 7.491* 09/07/2008 1539   PCO2ART 28.9* 09/07/2008 1539   PO2ART 46.0* 09/07/2008 1539   HCO3 22.1 09/07/2008 1539   TCO2 29 12/19/2012 1939   O2SAT 86.0 09/07/2008 1539    Recent Labs Lab 12/19/12 1930 12/19/12 2313 12/19/12 2359 12/20/12 0014 12/20/12 0700 12/20/12 1035  PROCALCITON  --   --  >175.00  --   --   --   WBC 18.4*  --   --  4.0 15.7*  --   LATICACIDVEN  --  4.2*  --   --   --  3.0*       No results found for this basename: GLUCAP,  in the last 168 hours  CXR:  Basilar atx   ASSESSMENT / PLAN:  PULMONARY A: Mild dyspnea: likely due to sepsis and progressive renal failure, exacerbated by pain.  OSA P:   Supplemental oxygen CPAP at HS Pulse ox   CARDIOVASCULAR A:  Septic shock P:  EGDT protocol Hold all antihypertensives See ID section   RENAL A:  Acute renal failure       Hypokalemia       Lactic acidosis.  Given h/o LN involvement in abd need to r/o obstruction P:   Renal US Renal dose meds Replace K Will need CT abd/pelvis  GASTROINTESTINAL A:   Diarrhea, likely d/t UTI, but could also consider PMC P:   NPO for now  HEMATOLOGIC A:  Anemia. Acute on chronic  P:  No evidence of bleeding  Add LMWH Check LE dopplers for LE swelling   INFECTIOUS A:   Sepsis/septic shock, UT source. R/o PMC P:   Pan culture  Empiric abx per dashboard  ENDOCRINE A:  DM/hyperglycemia  P:   ssi  NEUROLOGIC A:  No problems  P:   Supportive care   TODAY'S SUMMARY:  67 year old male w/ metastatic prostate CA/ Now in ICU w/ septic shock w/ UT source w/ progressive renal failure. Need to r/o hydro/ obstruction. EGDT protocol started.   I have personally obtained a history, examined the patient, evaluated laboratory and imaging results, formulated the assessment and plan and placed orders.  CRITICAL CARE: The patient is critically ill with multiple organ systems failure and requires high complexity decision making for assessment and  support, frequent evaluation and titration of therapies, application of advanced monitoring technologies and extensive interpretation of multiple databases. Critical Care Time devoted to patient care services described in this note is 40 minutes.    Pulmonary and Critical Care Medicine [facility] Pager: [phone]  12/20/2012, 11:37 AM

## 2012-12-20 NOTE — Progress Notes (Signed)
CRITICAL VALUE ALERT  Critical value received: K 2.9 Date of notification:  12/20/2012 Time of notification: 0900 Critical value read back:yes  Nurse who received alert:  Jonetta Speak RN MD notified (1st page):  Dr.Laza on unit made aware  Time of first page:  0900  MD notified (2nd page):  Time of second page:  Responding MD:  Dr. Lavera Guise  Time MD responded:  0900

## 2012-12-21 DIAGNOSIS — R609 Edema, unspecified: Secondary | ICD-10-CM

## 2012-12-21 DIAGNOSIS — R0602 Shortness of breath: Secondary | ICD-10-CM

## 2012-12-21 LAB — BASIC METABOLIC PANEL
BUN: 59 mg/dL — ABNORMAL HIGH (ref 6–23)
BUN: 56 mg/dL — ABNORMAL HIGH (ref 6–23)
CO2: 19 mEq/L (ref 19–32)
CO2: 20 mEq/L (ref 19–32)
Chloride: 110 mEq/L (ref 96–112)
Creatinine, Ser: 2.11 mg/dL — ABNORMAL HIGH (ref 0.50–1.35)
GFR calc non Af Amer: 21 mL/min — ABNORMAL LOW (ref >90)
Glucose, Bld: 181 mg/dL — ABNORMAL HIGH (ref 70–99)
Glucose, Bld: 231 mg/dL — ABNORMAL HIGH (ref 70–99)
Potassium: 2.8 mEq/L — ABNORMAL LOW (ref 3.5–5.1)
Potassium: 3.5 mEq/L (ref 3.5–5.1)

## 2012-12-21 LAB — CORTISOL: Cortisol, Plasma: 71 ug/dL

## 2012-12-21 LAB — CBC
HCT: 28.8 % — ABNORMAL LOW (ref 39.0–52.0)
Hemoglobin: 10.4 g/dL — ABNORMAL LOW (ref 13.0–17.0)
MCHC: 36.1 g/dL — ABNORMAL HIGH (ref 30.0–36.0)
RBC: 3.87 MIL/uL — ABNORMAL LOW (ref 4.22–5.81)

## 2012-12-21 MED ORDER — SODIUM CHLORIDE 0.45 % IV SOLN
INTRAVENOUS | Status: DC
  Administered 2012-12-21 – 2012-12-24 (×5): via INTRAVENOUS
  Administered 2012-12-25: 50 mL/h via INTRAVENOUS
  Administered 2012-12-25: 100 mL/h via INTRAVENOUS

## 2012-12-21 MED ORDER — POTASSIUM CHLORIDE 10 MEQ/100ML IV SOLN
10.0000 meq | INTRAVENOUS | Status: AC
  Administered 2012-12-21 (×6): 10 meq via INTRAVENOUS
  Filled 2012-12-21: qty 600

## 2012-12-21 MED ORDER — HEPARIN SODIUM (PORCINE) 5000 UNIT/ML IJ SOLN
5000.0000 [IU] | Freq: Three times a day (TID) | INTRAMUSCULAR | Status: AC
  Administered 2012-12-21 (×2): 5000 [IU] via SUBCUTANEOUS
  Filled 2012-12-21 (×3): qty 1

## 2012-12-21 MED ORDER — PIPERACILLIN-TAZOBACTAM 3.375 G IVPB
3.3750 g | Freq: Three times a day (TID) | INTRAVENOUS | Status: DC
  Administered 2012-12-21 – 2012-12-24 (×9): 3.375 g via INTRAVENOUS
  Filled 2012-12-21 (×11): qty 50

## 2012-12-21 NOTE — Progress Notes (Signed)
VASCULAR LAB PRELIMINARY  PRELIMINARY  PRELIMINARY  PRELIMINARY  Bilateral lower extremity venous Dopplers completed.    Preliminary report:  There is no DVT or SVT noted in the bilateral lower extremities.  Wilna Pennie, RVT 12/21/2012, 10:56 AM

## 2012-12-21 NOTE — Consult Note (Signed)
Urology Consult  CC:  prostate cancer, right hydronephrosis with sepsis  HPI: [name] is a 67 year old male who I have not seen in several years. He has a history of castrate resistant adenocarcinoma prostate he underwent radical retropubic prostatectomy in 2001. He is stage TIII NO and prostate cancer. He developed biochemical recurrence, and was subsequently treated with salvage radiotherapy. He was eventually placed on total androgen ablation, and has been seeing Dr. Eli Hose, and is currently on Zytiga. According to the patient, his PSA was above 200 within the past 2-3 months.  CT scan of the abdomen and pelvis in January revealed significant retroperitoneal adenopathy, but no hydronephrosis. 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 He presented to the hospital yesterday with several days of fever, shakes, chills, and was found to the septic. He was admitted to the emergency room. Renal ultrasound recently performed revealed new onset right hydronephrosis, moderate in nature. Urologic consultation is requested.  PMH: Past Medical History  Diagnosis Date  . Prostate cancer   . Hypertension   . Obesity   . Sleep apnea   . Osteoarthritis   . DVT (deep venous thrombosis) 08/2008  . 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 Prostatectomy  02/2000    Allergies: No Known Allergies  Medications: Prescriptions prior to admission  Medication Sig Dispense Refill  . abiraterone Acetate (ZYTIGA) 250 MG tablet Take 4 tablets (1,000 mg total) by mouth daily. Take on an empty stomach 1 hour before or 2 hours after a meal  120 tablet  1  . amLODipine (NORVASC) 10 MG tablet Take 10 mg by mouth daily.        [name] aspirin 325 MG tablet Take 650 mg by mouth daily.      [name] aspirin 81 MG tablet Take 81 mg by mouth daily.        . calcium carbonate (OS-CAL) 1250 MG chewable tablet  Chew 2 tablets by mouth daily.      . Calcium Carbonate Antacid (ALKA-SELTZER ANTACID PO) Take 2 tablets by mouth 2 (two) times daily as needed (for upset stomach).      . ciprofloxacin (CIPRO) 500 MG tablet Take 500 mg by mouth 2 (two) times daily. Started 12/19/12, for 10 days, ending 12/28/12.      [name] leuprolide (LUPRON) 30 MG injection Inject 30 mg into the muscle every 4 (four) months.        [name] losartan-hydrochlorothiazide (HYZAAR) 100-12.5 MG per tablet Take 1 tablet by mouth daily.        . predniSONE (DELTASONE) 5 MG tablet Take 1 tablet (5 mg total) by mouth daily.  180 tablet  1     Social History: History   Social History  . Marital Status: Married    Spouse Name: N/A    Number of Children: N/A  . Years of Education: N/A   Occupational History  . Not on file.   Social History Main Topics  . Smoking status: Never Smoker   . Smokeless tobacco: Not on file  . Alcohol Use: Yes     Comment: beer sometimes  . Drug Use: Not on file  . Sexually Active: Not on file   Other Topics Concern  . Not on file   Social History Narrative  . No narrative on file    Family History: No family history on file.  Review  of Systems: Positive: Fever, abdominal pain, dysuria, weakness Negative:   A further 10 point review of systems was negative except what is listed in the HPI.  Physical Exam: @VITALS2 @ General: No acute distress.  Awake. He is alert, answers questions appropriately. His CPAP mask is on. Head:  Normocephalic.  Atraumatic. ENT:  EOMI.  Mucous membranes moist Neck:  Supple.  No lymphadenopathy. CV:  S1 present. S2 present. Regular rate. Pulmonary: Equal effort bilaterally.  Clear to auscultation bilaterally. Abdomen: Soft. Obese, non- tender to palpation. Skin:  Normal turgor.  No visible rash. Extremity: No gross deformity of bilateral upper extremities.  No gross deformity of    bilateral lower extremities. Neurologic: Alert. Appropriate  mood.    Studies:  Recent Labs     12/20/12  0700  12/21/12  0500  HGB  9.0*  10.4*  WBC  15.7*  31.3*  PLT  68*  60*    Recent Labs     12/20/12  1315  12/21/12  0500  NA  144  150*  K  3.3*  2.8*  CL  107  114*  CO2  17*  19  BUN  69*  59*  CREATININE  4.64*  2.86*  CALCIUM  7.3*  8.0*  GFRNONAA  12*  21*  GFRAA  14*  25*     Recent Labs     12/20/12  1026  INR  1.42  APTT  39*     No components found with this basename: ABG,     Assessment:  1. Urosepsis. This is coupled with new onset right hydronephrosis.  2. Right hydronephrosis, most likely secondary to retroperitoneal adenopathy  3. Castrate resistant adenocarcinoma prostate, currently on androgen deprivation with Lupron, as well as Zytiga and prednisone  Plan: 1. I discussed further management with [name]. I would suggest urgent percutaneous nephrostomy tube placement on the right. I think, with him being stable, this can be done in the morning  2. Eventually, he will need long-term drainage with the nephrostomy tube, or possibly internalization with a double-J stent  3. I have ordered the nephrostomy tube placement    Pager:[phone]

## 2012-12-21 NOTE — Progress Notes (Signed)
Pt asking to be placed on CPAP for his nap. Pt tolerating at this time. RT will continue to monitor.

## 2012-12-21 NOTE — Progress Notes (Signed)
Patient placed on CPAP with 3L of oxygen bled in.  Patient comfortable at this time.  RT will continue to monitor.

## 2012-12-21 NOTE — Progress Notes (Signed)
ANTIBIOTIC CONSULT NOTE - FOLLOW UP  Pharmacy Consult for zosyn Indication: pyelonephritis/sepsis  No Known Allergies  Patient Measurements: Height: 5\' 8"  (172.7 cm) Weight: 246 lb 0.5 oz (111.6 kg) IBW/kg (Calculated) : 68.4   Vital Signs: Temp: 98 F (36.7 C) (03/13 0904) Temp src: Oral (03/13 0904) BP: 96/58 mmHg (03/13 1000) Pulse Rate: 75 (03/13 1000) Intake/Output from previous day: 03/12 0701 - 03/13 0700 In: 7436 [I.V.:5036; IV Piggyback:2400] Out: 3125 [Urine:3125] Intake/Output from this shift: Total I/O In: 876.3 [I.V.:476.3; IV Piggyback:400] Out: 550 [Urine:550]  Labs:  Recent Labs  12/20/12 0014 12/20/12 0700 12/20/12 1315 12/21/12 0500  WBC 4.0 15.7*  --  31.3*  HGB 11.2* 9.0*  --  10.4*  PLT 67* 68*  --  60*  CREATININE 4.29* 5.19* 4.64* 2.86*   Estimated Creatinine Clearance: 30.8 ml/min (by C-G formula based on Cr of 2.86). No results found for this basename: VANCOTROUGH, Leodis Binet, VANCORANDOM, GENTTROUGH, GENTPEAK, GENTRANDOM, TOBRATROUGH, TOBRAPEAK, TOBRARND, AMIKACINPEAK, AMIKACINTROU, AMIKACIN,  in the last 72 hours   Microbiology: Recent Results (from the past 720 hour(s))  URINE CULTURE     Status: None   Collection Time    12/19/12  8:52 PM      Result Value Range Status   Specimen Description URINE, CLEAN CATCH   Final   Special Requests NONE   Final   Culture  Setup Time 12/20/2012 02:15   Final   Colony Count 8,000 COLONIES/ML   Final   Culture INSIGNIFICANT GROWTH   Final   Report Status 12/20/2012 FINAL   Final  MRSA PCR SCREENING     Status: None   Collection Time    12/20/12 12:01 AM      Result Value Range Status   MRSA by PCR NEGATIVE  NEGATIVE Final   Comment:            The GeneXpert MRSA Assay (FDA     approved for NASAL specimens     only), is one component of a     comprehensive MRSA colonization     surveillance program. It is not     intended to diagnose MRSA     infection nor to guide or     monitor  treatment for     MRSA infections.  CULTURE, BLOOD (ROUTINE X 2)     Status: None   Collection Time    12/20/12  7:00 AM      Result Value Range Status   Specimen Description BLOOD HAND LEFT   Final   Special Requests BOTTLES DRAWN AEROBIC AND ANAEROBIC 10CC   Final   Culture  Setup Time 12/20/2012 13:46   Final   Culture     Final   Value:        BLOOD CULTURE RECEIVED NO GROWTH TO DATE CULTURE WILL BE HELD FOR 5 DAYS BEFORE ISSUING A FINAL NEGATIVE REPORT   Report Status PENDING   Incomplete  CULTURE, BLOOD (ROUTINE X 2)     Status: None   Collection Time    12/20/12  7:10 AM      Result Value Range Status   Specimen Description BLOOD HAND LEFT   Final   Special Requests BOTTLES DRAWN AEROBIC AND ANAEROBIC 10CC   Final   Culture  Setup Time 12/20/2012 13:46   Final   Culture     Final   Value:        BLOOD CULTURE RECEIVED NO GROWTH TO DATE CULTURE WILL BE HELD FOR  5 DAYS BEFORE ISSUING A FINAL NEGATIVE REPORT   Report Status PENDING   Incomplete  CLOSTRIDIUM DIFFICILE BY PCR     Status: None   Collection Time    12/20/12  9:54 AM      Result Value Range Status   C difficile by pcr NEGATIVE  NEGATIVE Final    Anti-infectives   Start     Dose/Rate Route Frequency Ordered Stop   12/21/12 1300  piperacillin-tazobactam (ZOSYN) IVPB 3.375 g     3.375 g 12.5 mL/hr over 240 Minutes Intravenous 3 times per day 12/21/12 1014     12/20/12 1800  piperacillin-tazobactam (ZOSYN) IVPB 2.25 g  Status:  Discontinued     2.25 g 100 mL/hr over 30 Minutes Intravenous 4 times per day 12/20/12 0954 12/21/12 1014   12/20/12 1245  piperacillin-tazobactam (ZOSYN) IVPB 3.375 g     3.375 g 100 mL/hr over 30 Minutes Intravenous STAT 12/20/12 1229 12/20/12 1321   12/20/12 1000  metroNIDAZOLE (FLAGYL) IVPB 500 mg  Status:  Discontinued     500 mg 100 mL/hr over 60 Minutes Intravenous Every 8 hours 12/20/12 0920 12/21/12 0919   12/20/12 0800  piperacillin-tazobactam (ZOSYN) IVPB 3.375 g  Status:   Discontinued     3.375 g 12.5 mL/hr over 240 Minutes Intravenous 3 times per day 12/20/12 0735 12/20/12 0954   12/20/12 0100  ciprofloxacin (CIPRO) IVPB 400 mg  Status:  Discontinued     400 mg 200 mL/hr over 60 Minutes Intravenous Every 12 hours 12/20/12 0011 12/20/12 0730   12/19/12 2130  cefTRIAXone (ROCEPHIN) 1 g in dextrose 5 % 50 mL IVPB     1 g 100 mL/hr over 30 Minutes Intravenous  Once 12/19/12 2125 12/19/12 2232      Assessment:  67 y.o male admitted with pyelonephritis, septic shock on Zosyn. Scr improved today 2.86 < 4.64 est crcl 30.8 ml/min. WBC 31.3, afebrile, urine cx negative, blood cx pending.   Plan:  1. Increase Zosyn 3.375 gm IV q8h 2. F/u renal function and clinical progression 3. Monitor cultures  Bola A. Wandra Feinstein D Clinical Pharmacist Pager:[phone] Phone [phone] 12/21/2012 10:21 AM

## 2012-12-21 NOTE — Progress Notes (Signed)
eLink Physician-Brief Progress Note Patient Name: [name] DOB: January 19, 1946 MRN: [id number]  Date of Service  12/21/2012   HPI/Events of Note   Hypokalemia  eICU Interventions  Potassium replaced   Intervention Category Intermediate Interventions: Electrolyte abnormality - evaluation and management  DETERDING,ELIZABETH 12/21/2012, 6:37 AM

## 2012-12-21 NOTE — Progress Notes (Signed)
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 CT in jan 2014 showed stable disease but has had rising PSA (currently on Zytiga, Prednisone and Lupron). He was admitted on 3/11 w/ dx of pyelonephritis. Transferred to the ICU on 3/12 in septic shock.   SIGNIFICANT EVENTS / STUDIES:  Renal US 3/12>>> CT abd/pelvis >>>needs to be ordered, holding d/t shock.   LINES / TUBES: Right IJ CVL 3/12>>>  CULTURES: UC 3/11>>> BC 3/12>>> C diff 3/12>>> negative  ANTIBIOTICS: Flagyl 3/12>>> 3/13 Zosyn 3/12>>>  HISTORY OF PRESENT ILLNESS:    67 yo male with h/o prostate cancer, htn, admitted on 3/11 w/ CC:  5 days of diarrhea nonbloody and dysuria. Went to see his pcp today, ua was cked and dx with uti given script for cipro and some lab work was done. Later he was called by pcp to come to ED for acute renal failure. Over the last month his cr went from normal and is now over 4 with mildly low k. No abd pain. No fevers. No hematuria. His bp was borderline low sbp 90 on arrival, he is receiving his second liter ivf and sbp is some improved now. He was admitted w/ working dx of pyelonephritis and sepsis. He was admitted to the med surg unit, treated w/ IVFs and antibiotics. As well as holding his antihypertensives. He had persistent and progressive hypotension over the PM hours he was transferred to the ICU the am of 3/12 in shock.   SUBJECTIVE:  feeling better Pressors weaning, currently norepi 4  VITAL SIGNS: Temp:  [97.3 F (36.3 C)-98.3 F (36.8 C)] 98 F (36.7 C) (03/13 0904) Pulse Rate:  [74-86] 74 (03/13 0900) Resp:  [15-30] 29 (03/13 0900) BP: (73-105)/(44-63) 104/63 mmHg (03/13 0900) SpO2:  [86 %-98 %] 97 % (03/13  0900) Weight:  [111.5 kg (245 lb 13 oz)-111.6 kg (246 lb 0.5 oz)] 111.6 kg (246 lb 0.5 oz) (03/13 0500) HEMODYNAMICS: CVP:  [7 mmHg-10 mmHg] 7 mmHg VENTILATOR SETTINGS:   INTAKE / OUTPUT: Intake/Output     03/12 0701 - 03/13 0700 03/13 0701 - 03/14 0700   I.V. (mL/kg) 5036 (45.1) 322.5 (2.9)   IV Piggyback 2400 200   Total Intake(mL/kg) 7436 (66.6) 522.5 (4.7)   Urine (mL/kg/hr) 3125 (1.2) 550 (2.3)   Total Output 3125 550   Net +4311 -27.5          PHYSICAL EXAMINATION: General:  Overwt man, No acute distress.  Neuro:  Awake, no focal def  HEENT:  [place], no JVD Cardiovascular:  rrr Lungs:  Decreased in bases.  Abdomen:  Soft, non-tender + bowel sounds  Musculoskeletal:  Intact  Skin:  + lower extremity edema   LABS:  Recent Labs Lab 12/20/12 0700 12/20/12 1315 12/21/12 0500  NA 144 144 150*  K 2.7* 3.3* 2.8*  CL 105 107 114*  CO2 21 17* 19  BUN 69* 69* 59*  CREATININE 5.19* 4.64* 2.86*  GLUCOSE 95 118* 181*    Recent Labs Lab 12/20/12 0014 12/20/12 0700 12/21/12 0500  HGB 11.2* 9.0* 10.4*  HCT 31.8* 26.1* 28.8*  WBC 4.0 15.7* 31.3*  PLT 67* 68* 60*   ABG    Component Value Date/Time  PHART 7.491* 09/07/2008 1539   PCO2ART 28.9* 09/07/2008 1539   PO2ART 46.0* 09/07/2008 1539   HCO3 22.1 09/07/2008 1539   TCO2 29 12/19/2012 1939   O2SAT 65.1 12/20/2012 1630    Recent Labs Lab 12/19/12 1930 12/19/12 2313 12/19/12 2359 12/20/12 0014 12/20/12 0700 12/20/12 1035 12/21/12 0500  PROCALCITON  --   --  >175.00  --   --   --   --   WBC 18.4*  --   --  4.0 15.7*  --  31.3*  LATICACIDVEN  --  4.2*  --   --   --  3.0*  --      Recent Labs Lab 12/20/12 1125  GLUCAP 114*    CXR:  Basilar atx   ASSESSMENT / PLAN:  PULMONARY A: Mild dyspnea: likely due to sepsis and progressive renal failure, exacerbated by pain.  OSA P:   Supplemental oxygen CPAP at HS Pulse ox   CARDIOVASCULAR A:  Septic shock due to pyelo P:  EGDT protocol Hold  all antihypertensives Stress steroids ordered; am cortisol 71 on 3/13 (but on hydrocort) See ID section   RENAL A:  Acute renal failure, post-obstructive vs ATN      Hypokalemia      Hypernatremia      Lactic acidosis.  Given h/o LN involvement in abd need to r/o obstruction P:   Renal US shows moderate R hydronephrosis. Discussed with radiology 3/13 > Not clear that CT scan abd/pelvis without contrast will give adequate imaging of his LAD. Consider urology consult for retrograde ureterogram to better eval Renal dose meds Replace K Start clear liquids, IVF to 0.45NS  GASTROINTESTINAL A:   Diarrhea, likely d/t UTI, but could also consider PMC P:   Start clears and follow  HEMATOLOGIC A:  Anemia. Acute on chronic  P:  No evidence of bleeding  Change LMWH to heparin sq in pt w renal failure Check LE dopplers for LE swelling >> pending  INFECTIOUS A:   Sepsis/septic shock, UT source. R/o PMC >> C diff negative P:   Pan culture  Empiric abx per dashboard; d/c IV flagyl on zosyn.   ENDOCRINE A:  DM/hyperglycemia  P:   ssi  NEUROLOGIC A:  No problems  P:   Supportive care   TODAY'S SUMMARY:  67 year old male w/ metastatic prostate CA/ Now in ICU w/ septic shock w/ UT source w/ progressive renal failure. Evaluating for R obstruction. EGDT  I have personally obtained a history, examined the patient, evaluated laboratory and imaging results, formulated the assessment and plan and placed orders.  CRITICAL CARE: The patient is critically ill with multiple organ systems failure and requires high complexity decision making for assessment and support, frequent evaluation and titration of therapies, application of advanced monitoring technologies and extensive interpretation of multiple databases. Critical Care Time devoted to patient care services described in this note is 30 minutes.    Levy Pupa, MD, PhD 12/21/2012, 9:19 AM [facility] [phone]  or if no answer [phone]

## 2012-12-22 ENCOUNTER — Inpatient Hospital Stay (HOSPITAL_COMMUNITY): Payer: Medicare Other

## 2012-12-22 ENCOUNTER — Encounter (HOSPITAL_COMMUNITY): Payer: Self-pay | Admitting: Radiology

## 2012-12-22 LAB — BASIC METABOLIC PANEL
BUN: 55 mg/dL — ABNORMAL HIGH (ref 6–23)
CO2: 23 mEq/L (ref 19–32)
Chloride: 112 mEq/L (ref 96–112)
Creatinine, Ser: 1.84 mg/dL — ABNORMAL HIGH (ref 0.50–1.35)
Glucose, Bld: 166 mg/dL — ABNORMAL HIGH (ref 70–99)
Potassium: 3.1 mEq/L — ABNORMAL LOW (ref 3.5–5.1)

## 2012-12-22 LAB — CBC
Hemoglobin: 10.9 g/dL — ABNORMAL LOW (ref 13.0–17.0)
MCH: 27.4 pg (ref 26.0–34.0)
MCV: 74.6 fL — ABNORMAL LOW (ref 78.0–100.0)
RBC: 3.98 MIL/uL — ABNORMAL LOW (ref 4.22–5.81)
WBC: 30.7 10*3/uL — ABNORMAL HIGH (ref 4.0–10.5)

## 2012-12-22 LAB — PROTIME-INR: INR: 1.14 (ref 0.00–1.49)

## 2012-12-22 MED ORDER — FENTANYL CITRATE 0.05 MG/ML IJ SOLN
INTRAMUSCULAR | Status: AC | PRN
  Administered 2012-12-22 (×2): 25 ug via INTRAVENOUS

## 2012-12-22 MED ORDER — MIDAZOLAM HCL 2 MG/2ML IJ SOLN
INTRAMUSCULAR | Status: AC | PRN
  Administered 2012-12-22 (×2): 1 mg via INTRAVENOUS

## 2012-12-22 MED ORDER — IOHEXOL 300 MG/ML  SOLN
50.0000 mL | Freq: Once | INTRAMUSCULAR | Status: AC | PRN
  Administered 2012-12-22: 1 mL

## 2012-12-22 MED ORDER — POTASSIUM CHLORIDE 10 MEQ/50ML IV SOLN
10.0000 meq | INTRAVENOUS | Status: AC
  Administered 2012-12-22 (×4): 10 meq via INTRAVENOUS
  Filled 2012-12-22: qty 200

## 2012-12-22 NOTE — Procedures (Signed)
Mild right hydronephrosis.  Placed 10 French right nephrostomy tube without complication.  There appears to be a distal ureter obstruction, near UVJ.  No immediate complication.

## 2012-12-22 NOTE — Progress Notes (Signed)
Subjective: Patient reports that he is feeling better. He is not experiencing significant pain at this point.  Objective: Vital signs in last 24 hours: Temp:  [97.3 F (36.3 C)-98.3 F (36.8 C)] 98.3 F (36.8 C) (03/14 0747) Pulse Rate:  [60-77] 62 (03/14 1100) Resp:  [15-27] 17 (03/14 1100) BP: (77-109)/(51-74) 98/63 mmHg (03/14 1100) SpO2:  [91 %-100 %] 98 % (03/14 1100) Weight:  [112.9 kg (248 lb 14.4 oz)] 112.9 kg (248 lb 14.4 oz) (03/14 0500)  Intake/Output from previous day: 03/13 0701 - 03/14 0700 In: 3503.3 [I.V.:2703.3; IV Piggyback:800] Out: 2675 [Urine:2675] Intake/Output this shift: Total I/O In: 415.2 [I.V.:415.2] Out: 250 [Urine:250]  Physical Exam:  Constitutional: Vital signs reviewed. WD WN in NAD   Eyes: PERRL, No scleral icterus.   Cardiovascular: RRR Pulmonary/Chest: Normal effort Abdominal: Soft. Non-tender, non-distended, bowel sounds are normal, no masses, organomegaly, or guarding present.  Genitourinary: Extremities: No cyanosis or edema   Lab Results:  Recent Labs  12/20/12 0700 12/21/12 0500 12/22/12 0530  HGB 9.0* 10.4* 10.9*  HCT 26.1* 28.8* 29.7*   BMET  Recent Labs  12/21/12 1800 12/22/12 0530  NA 143 146*  K 3.5 3.1*  CL 110 112  CO2 20 23  GLUCOSE 231* 166*  BUN 56* 55*  CREATININE 2.11* 1.84*  CALCIUM 8.2* 8.1*    Recent Labs  12/20/12 1026 12/22/12 0530  INR 1.42 1.14   No results found for this basename: LABURIN,  in the last 72 hours Results for orders placed during the hospital encounter of 12/19/12  URINE CULTURE     Status: None   Collection Time    12/19/12  8:52 PM      Result Value Range Status   Specimen Description URINE, CLEAN CATCH   Final   Special Requests NONE   Final   Culture  Setup Time 12/20/2012 02:15   Final   Colony Count 8,000 COLONIES/ML   Final   Culture INSIGNIFICANT GROWTH   Final   Report Status 12/20/2012 FINAL   Final  MRSA PCR SCREENING     Status: None   Collection Time     12/20/12 12:01 AM      Result Value Range Status   MRSA by PCR NEGATIVE  NEGATIVE Final   Comment:            The GeneXpert MRSA Assay (FDA     approved for NASAL specimens     only), is one component of a     comprehensive MRSA colonization     surveillance program. It is not     intended to diagnose MRSA     infection nor to guide or     monitor treatment for     MRSA infections.  CULTURE, BLOOD (ROUTINE X 2)     Status: None   Collection Time    12/20/12  7:00 AM      Result Value Range Status   Specimen Description BLOOD HAND LEFT   Final   Special Requests BOTTLES DRAWN AEROBIC AND ANAEROBIC 10CC   Final   Culture  Setup Time 12/20/2012 13:46   Final   Culture     Final   Value:        BLOOD CULTURE RECEIVED NO GROWTH TO DATE CULTURE WILL BE HELD FOR 5 DAYS BEFORE ISSUING A FINAL NEGATIVE REPORT   Report Status PENDING   Incomplete  CULTURE, BLOOD (ROUTINE X 2)     Status: None   Collection Time  12/20/12  7:10 AM      Result Value Range Status   Specimen Description BLOOD HAND LEFT   Final   Special Requests BOTTLES DRAWN AEROBIC AND ANAEROBIC 10CC   Final   Culture  Setup Time 12/20/2012 13:46   Final   Culture     Final   Value:        BLOOD CULTURE RECEIVED NO GROWTH TO DATE CULTURE WILL BE HELD FOR 5 DAYS BEFORE ISSUING A FINAL NEGATIVE REPORT   Report Status PENDING   Incomplete  CLOSTRIDIUM DIFFICILE BY PCR     Status: None   Collection Time    12/20/12  9:54 AM      Result Value Range Status   C difficile by pcr NEGATIVE  NEGATIVE Final  URINE CULTURE     Status: None   Collection Time    12/20/12 11:34 AM      Result Value Range Status   Specimen Description URINE, CATHETERIZED   Final   Special Requests NONE   Final   Culture  Setup Time 12/20/2012 13:03   Final   Colony Count PENDING   Incomplete   Culture Culture reincubated for better growth   Final   Report Status PENDING   Incomplete    Studies/Results: US Renal Port  12/20/2012   *RADIOLOGY REPORT*  Clinical Data: Acute renal failure.  Decreased urine output.  RENAL/URINARY TRACT ULTRASOUND COMPLETE  Comparison:  CT abdomen pelvis 10/24/2012.  Findings:  Right Kidney:  Moderate right-sided hydronephrosis is present.  The obstructing lesion is not evident on this exam.  The right renal parenchyma is preserved.  It is of normal size and echotexture. The maximal length is 12.7 cm, within normal limits.  Left Kidney:  Isoechoic lesion at the upper pole of the right kidney measures 1.7 x 2.3 x 2.1 cm. Previous CT scan revealed a simple cyst at this location.  There is no shadowing on today's study.  The other renal cysts are not demonstrated.  The parenchyma is within normal limits.  There is no hydronephrosis.  No stones are evident.  Bladder:  A Foley catheter is present within the urinary bladder.  IMPRESSION:  1.  Moderate right-sided hydronephrosis. 2.  Isoechoic lesion of the left kidney may represent a hemorrhagic cyst.  A simple cyst was evident in this location on prior exams.   Original Report Authenticated By: Marin Roberts, [name]    Dg Chest Port 1 View  12/20/2012  *RADIOLOGY REPORT*  Clinical Data: Central line placement  PORTABLE CHEST - 1 VIEW  Comparison: Chest radiograph 03/12 1014  Findings: Interval place right central venous line with tip in distal SVC.  No pneumothorax.  Stable enlarged heart silhouette. There is central venous pulmonary congestion unchanged.  IMPRESSION: Interval placement of right central venous line without pneumothorax.   Original Report Authenticated By: Genevive Bi, [name]     Assessment/Plan:  Assessment:  1. Urosepsis. This is coupled with new onset right hydronephrosis.  2. Right hydronephrosis, most likely secondary to retroperitoneal adenopathy . His creatinine is improving some. 3. Castrate resistant adenocarcinoma prostate, currently on androgen deprivation with Lupron, as well as Zytiga and prednisone  I have ordered a  percutaneous nephrostomy tube placed. I have discussed this with the patient as well as radiology.once this is in place, it could possibly be internalized, although there would always be a chance of inadequate drainage with an internal stent. We can decide to do that at a later date, once  he leaves the hospital.  LOS: 3 days   Marcine Matar M 12/22/2012, 11:34 AM

## 2012-12-22 NOTE — Progress Notes (Signed)
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 CT in jan 2014 showed stable disease but has had rising PSA (currently on Zytiga, Prednisone and Lupron). He was admitted on 3/11 w/ dx of pyelonephritis. Transferred to the ICU on 3/12 in septic shock.   SIGNIFICANT EVENTS / STUDIES:  Renal US 3/12>>> CT abd/pelvis >>>needs to be ordered, holding d/t shock.   LINES / TUBES: Right IJ CVL 3/12>>>  CULTURES: UC 3/11>>> BC 3/12>>> C diff 3/12>>> negative  ANTIBIOTICS: Flagyl 3/12>>> 3/13 Zosyn 3/12>>>  HISTORY OF PRESENT ILLNESS:    67 yo male with h/o prostate cancer, htn, admitted on 3/11 w/ CC:  5 days of diarrhea nonbloody and dysuria. Went to see his pcp today, ua was cked and dx with uti given script for cipro and some lab work was done. Later he was called by pcp to come to ED for acute renal failure. Over the last month his cr went from normal and is now over 4 with mildly low k. No abd pain. No fevers. No hematuria. His bp was borderline low sbp 90 on arrival, he is receiving his second liter ivf and sbp is some improved now. He was admitted w/ working dx of pyelonephritis and sepsis. He was admitted to the med surg unit, treated w/ IVFs and antibiotics. As well as holding his antihypertensives. He had persistent and progressive hypotension over the PM hours he was transferred to the ICU the am of 3/12 in shock.   SUBJECTIVE:  Comfortable, feels better, remains on norepi  VITAL SIGNS: Temp:  [97.3 F (36.3 C)-98.3 F (36.8 C)] 98.3 F (36.8 C) (03/14 0747) Pulse Rate:  [60-77] 63 (03/14 1000) Resp:  [15-27] 15 (03/14 1000) BP: (77-109)/(34-74) 105/61 mmHg (03/14 1000) SpO2:  [91 %-100 %] 98 % (03/14 1000) Weight:   [112.9 kg (248 lb 14.4 oz)] 112.9 kg (248 lb 14.4 oz) (03/14 0500) HEMODYNAMICS: CVP:  [5 mmHg-12 mmHg] 7 mmHg VENTILATOR SETTINGS:   INTAKE / OUTPUT: Intake/Output     03/13 0701 - 03/14 0700 03/14 0701 - 03/15 0700   I.V. (mL/kg) 2703.3 (23.9) 311.4 (2.8)   IV Piggyback 800    Total Intake(mL/kg) 3503.3 (31) 311.4 (2.8)   Urine (mL/kg/hr) 2675 (1) 250 (0.6)   Total Output 2675 250   Net +828.3 +61.4        Urine Occurrence 1 x    Stool Occurrence 2 x      PHYSICAL EXAMINATION: General:  Overwt man, No acute distress.  Neuro:  Awake, no focal def  HEENT:  [employer], no JVD Cardiovascular:  rrr Lungs:  Decreased in bases.  Abdomen:  Soft, non-tender + bowel sounds  Musculoskeletal:  Intact  Skin:  + lower extremity edema   LABS:  Recent Labs Lab 12/21/12 0500 12/21/12 1800 12/22/12 0530  NA 150* 143 146*  K 2.8* 3.5 3.1*  CL 114* 110 112  CO2 19 20 23   BUN 59* 56* 55*  CREATININE 2.86* 2.11* 1.84*  GLUCOSE 181* 231* 166*    Recent Labs Lab 12/20/12 0700 12/21/12 0500 12/22/12 0530  HGB 9.0* 10.4* 10.9*  HCT 26.1* 28.8* 29.7*  WBC 15.7* 31.3* 30.7*  PLT 68* 60* 44*   ABG  Component Value Date/Time   PHART 7.491* 09/07/2008 1539   PCO2ART 28.9* 09/07/2008 1539   PO2ART 46.0* 09/07/2008 1539   HCO3 22.1 09/07/2008 1539   TCO2 29 12/19/2012 1939   O2SAT 65.1 12/20/2012 1630    Recent Labs Lab 12/19/12 1930 12/19/12 2313 12/19/12 2359 12/20/12 0014 12/20/12 0700 12/20/12 1035 12/21/12 0500 12/22/12 0530  PROCALCITON  --   --  >175.00  --   --   --   --   --   WBC 18.4*  --   --  4.0 15.7*  --  31.3* 30.7*  LATICACIDVEN  --  4.2*  --   --   --  3.0*  --  1.3     Recent Labs Lab 12/20/12 1125 12/21/12 0856  GLUCAP 114* 149*    CXR:  Basilar atx   ASSESSMENT / PLAN:  PULMONARY A: Mild dyspnea: likely due to sepsis and progressive renal failure, exacerbated by pain.  OSA P:   Supplemental oxygen CPAP at HS Pulse ox    CARDIOVASCULAR A:  Septic shock due to pyelo P:  EGDT protocol completed Hold all antihypertensives Stress steroids ordered; am cortisol 71 on 3/13 (but on hydrocort) Weaning norepi See ID section   RENAL A:  Acute renal failure, post-obstructive vs ATN >> improving      Hypokalemia      Hypernatremia      Lactic acidosis >> improved 3/14 Given h/o LN involvement in abd need to r/o obstruction P:   Renal US shows moderate R hydronephrosis. Appreciate Dr [name]'s eval >> going for percutaneous nephrostomy 3/14 Renal dose meds Replace K Continue diet  GASTROINTESTINAL A:   Diarrhea, likely d/t UTI, but could also consider PMC P:   Follow w PO diet  HEMATOLOGIC A:  Anemia. Acute on chronic  P:  No evidence of bleeding  Changed LMWH to heparin sq in pt w renal failure Check LE dopplers for LE swelling >> pending  INFECTIOUS A:   Sepsis/septic shock, UT source. R/o PMC >> C diff negative P:   Pan culture > negative to date 3/14 Empiric abx per dashboard; on zosyn.   ENDOCRINE A:  DM/hyperglycemia  P:   ssi  NEUROLOGIC A:  No problems  P:   Supportive care   TODAY'S SUMMARY:  67 year old male w/ metastatic prostate CA/ Now in ICU w/ septic shock w/ UT source w/ progressive renal failure. EGDT. For perc nephrostomy 3/14  I have personally obtained a history, examined the patient, evaluated laboratory and imaging results, formulated the assessment and plan and placed orders.  CRITICAL CARE: The patient is critically ill with multiple organ systems failure and requires high complexity decision making for assessment and support, frequent evaluation and titration of therapies, application of advanced monitoring technologies and extensive interpretation of multiple databases. Critical Care Time devoted to patient care services described in this note is 30 minutes.    Levy Pupa, MD, PhD 12/22/2012, 10:32 AM [facility] [phone] or  if no answer [phone]

## 2012-12-22 NOTE — Progress Notes (Signed)
[facility] ADULT ICU REPLACEMENT PROTOCOL FOR AM LAB REPLACEMENT ONLY  The patient does apply for the [facility] Adult ICU Electrolyte Replacment Protocol based on the criteria listed below:   1. Is GFR >/= 40 ml/min? yes  Patient's GFR today is 42 2. Is urine output >/= 0.5 ml/kg/hr for the last 6 hours? yes Patient's UOP is 1.0 ml/kg/hr 3. Is BUN < 60 mg/dL? yes  Patient's BUN today is 55 4. Abnormal electrolyte(s):Potassium 5. Ordered repletion with: Potassium per protocol 6. If a panic level lab has been reported, has the CCM MD in charge been notified? no.   Physician:   Thomasenia Bottoms 12/22/2012 6:27 AM

## 2012-12-22 NOTE — H&P (Signed)
[name] is an 67 y.o. male.    Chief Complaint: Right Hydronephrosis Hx prostate Ca; sepsis Request R Percutaneous nephrostomy tube placement per Dr Acquanetta Chain HPI: HTN; prostate ca; retroperitoneal LAN   Past Medical History  Diagnosis Date  . Prostate cancer   . Hypertension   . Obesity   . Sleep apnea   . Osteoarthritis   . DVT (deep venous thrombosis) 08/2008  . 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 Prostatectomy  02/2000    No family history on file. Social History:  reports that he has never smoked. He does not have any smokeless tobacco history on file. He reports that  drinks alcohol. His drug history is not on file.  Allergies: No Known Allergies  Medications Prior to Admission  Medication Sig Dispense Refill  . abiraterone Acetate (ZYTIGA) 250 MG tablet Take 4 tablets (1,000 mg total) by mouth daily. Take on an empty stomach 1 hour before or 2 hours after a meal  120 tablet  1  . amLODipine (NORVASC) 10 MG tablet Take 10 mg by mouth daily.        [name] aspirin 325 MG tablet Take 650 mg by mouth daily.      [name] aspirin 81 MG tablet Take 81 mg by mouth daily.        . calcium carbonate (OS-CAL) 1250 MG chewable tablet Chew 2 tablets by mouth daily.      . Calcium Carbonate Antacid (ALKA-SELTZER ANTACID PO) Take 2 tablets by mouth 2 (two) times daily as needed (for upset stomach).      . ciprofloxacin (CIPRO) 500 MG tablet Take 500 mg by mouth 2 (two) times daily. Started 12/19/12, for 10 days, ending 12/28/12.      [name] leuprolide (LUPRON) 30 MG injection Inject 30 mg into the muscle every 4 (four) months.        [name] losartan-hydrochlorothiazide (HYZAAR) 100-12.5 MG per tablet Take 1 tablet by mouth daily.        . predniSONE (DELTASONE) 5 MG tablet Take 1 tablet (5 mg total) by mouth daily.  180 tablet  1    Results for orders placed during the hospital encounter of 12/19/12 (from the past 48 hour(s))  CLOSTRIDIUM DIFFICILE  BY PCR     Status: None   Collection Time    12/20/12  9:54 AM      Result Value Range   C difficile by pcr NEGATIVE  NEGATIVE  MAGNESIUM     Status: None   Collection Time    12/20/12 10:26 AM      Result Value Range   Magnesium 1.7  1.5 - 2.5 mg/dL  APTT     Status: Abnormal   Collection Time    12/20/12 10:26 AM      Result Value Range   aPTT 39 (*) 24 - 37 seconds   Comment:            IF BASELINE aPTT IS ELEVATED,     SUGGEST PATIENT RISK ASSESSMENT     BE USED TO DETERMINE APPROPRIATE     ANTICOAGULANT THERAPY.  FIBRINOGEN     Status: Abnormal   Collection Time    12/20/12 10:26 AM      Result Value Range   Fibrinogen 687 (*) 204 - 475 mg/dL  PROTIME-INR     Status: Abnormal   Collection Time    12/20/12 10:26 AM  Result Value Range   Prothrombin Time 17.0 (*) 11.6 - 15.2 seconds   INR 1.42  0.00 - 1.49  LACTIC ACID, PLASMA     Status: Abnormal   Collection Time    12/20/12 10:35 AM      Result Value Range   Lactic Acid, Venous 3.0 (*) 0.5 - 2.2 mmol/L  GLUCOSE, CAPILLARY     Status: Abnormal   Collection Time    12/20/12 11:25 AM      Result Value Range   Glucose-Capillary 114 (*) 70 - 99 mg/dL  URINALYSIS, ROUTINE W REFLEX MICROSCOPIC     Status: Abnormal   Collection Time    12/20/12 11:34 AM      Result Value Range   Color, Urine YELLOW  YELLOW   APPearance TURBID (*) CLEAR   Specific Gravity, Urine 1.021  1.005 - 1.030   pH 5.0  5.0 - 8.0   Glucose, UA NEGATIVE  NEGATIVE mg/dL   Hgb urine dipstick LARGE (*) NEGATIVE   Bilirubin Urine SMALL (*) NEGATIVE   Ketones, ur NEGATIVE  NEGATIVE mg/dL   Protein, ur 161 (*) NEGATIVE mg/dL   Urobilinogen, UA 1.0  0.0 - 1.0 mg/dL   Nitrite NEGATIVE  NEGATIVE   Leukocytes, UA LARGE (*) NEGATIVE  URINE MICROSCOPIC-ADD ON     Status: Abnormal   Collection Time    12/20/12 11:34 AM      Result Value Range   Squamous Epithelial / LPF RARE  RARE   WBC, UA TOO NUMEROUS TO COUNT  <3 WBC/hpf   RBC / HPF 7-10  <3  RBC/hpf   Bacteria, UA MANY (*) RARE   Urine-Other LESS THAN 10 mL OF URINE SUBMITTED    URINE CULTURE     Status: None   Collection Time    12/20/12 11:34 AM      Result Value Range   Specimen Description URINE, CATHETERIZED     Special Requests NONE     Culture  Setup Time 12/20/2012 13:03     Colony Count PENDING     Culture Culture reincubated for better growth     Report Status PENDING    BASIC METABOLIC PANEL     Status: Abnormal   Collection Time    12/20/12  1:15 PM      Result Value Range   Sodium 144  135 - 145 mEq/L   Potassium 3.3 (*) 3.5 - 5.1 mEq/L   Chloride 107  96 - 112 mEq/L   CO2 17 (*) 19 - 32 mEq/L   Glucose, Bld 118 (*) 70 - 99 mg/dL   BUN 69 (*) 6 - 23 mg/dL   Creatinine, Ser 0.96 (*) 0.50 - 1.35 mg/dL   Calcium 7.3 (*) 8.4 - 10.5 mg/dL   GFR calc non Af Amer 12 (*) >90 mL/min   GFR calc Af Amer 14 (*) >90 mL/min   Comment:            The eGFR has been calculated     using the CKD EPI equation.     This calculation has not been     validated in all clinical     situations.     eGFR's persistently     <90 mL/min signify     possible Chronic Kidney Disease.  CORTISOL     Status: None   Collection Time    12/20/12  1:15 PM      Result Value Range   Cortisol, Plasma 71.0  Comment: (NOTE)     Result repeated and verified.     Result confirmed by automatic dilution.     AM:  4.3 - 22.4 ug/dL     PM:  3.1 - 96.0 ug/dL  TYPE AND SCREEN     Status: None   Collection Time    12/20/12  1:15 PM      Result Value Range   ABO/RH(D) A POS     Antibody Screen NEG     Sample Expiration 12/23/2012    CARBOXYHEMOGLOBIN     Status: Abnormal   Collection Time    12/20/12  4:30 PM      Result Value Range   Total hemoglobin 9.1 (*) 13.5 - 18.0 g/dL   O2 Saturation 45.4     Carboxyhemoglobin 1.2  0.5 - 1.5 %   Methemoglobin 1.3  0.0 - 1.5 %  CBC     Status: Abnormal   Collection Time    12/21/12  5:00 AM      Result Value Range   WBC 31.3 (*) 4.0 -  10.5 K/uL   RBC 3.87 (*) 4.22 - 5.81 MIL/uL   Hemoglobin 10.4 (*) 13.0 - 17.0 g/dL   HCT 09.8 (*) 11.9 - 14.7 %   MCV 74.4 (*) 78.0 - 100.0 fL   MCH 26.9  26.0 - 34.0 pg   MCHC 36.1 (*) 30.0 - 36.0 g/dL   RDW 82.9 (*) 56.2 - 13.0 %   Platelets 60 (*) 150 - 400 K/uL   Comment: CONSISTENT WITH PREVIOUS RESULT  BASIC METABOLIC PANEL     Status: Abnormal   Collection Time    12/21/12  5:00 AM      Result Value Range   Sodium 150 (*) 135 - 145 mEq/L   Potassium 2.8 (*) 3.5 - 5.1 mEq/L   Chloride 114 (*) 96 - 112 mEq/L   CO2 19  19 - 32 mEq/L   Glucose, Bld 181 (*) 70 - 99 mg/dL   BUN 59 (*) 6 - 23 mg/dL   Creatinine, Ser 8.65 (*) 0.50 - 1.35 mg/dL   Comment: DELTA CHECK NOTED   Calcium 8.0 (*) 8.4 - 10.5 mg/dL   GFR calc non Af Amer 21 (*) >90 mL/min   GFR calc Af Amer 25 (*) >90 mL/min   Comment:            The eGFR has been calculated     using the CKD EPI equation.     This calculation has not been     validated in all clinical     situations.     eGFR's persistently     <90 mL/min signify     possible Chronic Kidney Disease.  GLUCOSE, CAPILLARY     Status: Abnormal   Collection Time    12/21/12  8:56 AM      Result Value Range   Glucose-Capillary 149 (*) 70 - 99 mg/dL  BASIC METABOLIC PANEL     Status: Abnormal   Collection Time    12/21/12  6:00 PM      Result Value Range   Sodium 143  135 - 145 mEq/L   Potassium 3.5  3.5 - 5.1 mEq/L   Comment: DELTA CHECK NOTED     NO VISIBLE HEMOLYSIS   Chloride 110  96 - 112 mEq/L   CO2 20  19 - 32 mEq/L   Glucose, Bld 231 (*) 70 - 99 mg/dL   BUN 56 (*) 6 -  23 mg/dL   Creatinine, Ser 4.01 (*) 0.50 - 1.35 mg/dL   Calcium 8.2 (*) 8.4 - 10.5 mg/dL   GFR calc non Af Amer 31 (*) >90 mL/min   GFR calc Af Amer 36 (*) >90 mL/min   Comment:            The eGFR has been calculated     using the CKD EPI equation.     This calculation has not been     validated in all clinical     situations.     eGFR's persistently     <90 mL/min  signify     possible Chronic Kidney Disease.  BASIC METABOLIC PANEL     Status: Abnormal   Collection Time    12/22/12  5:30 AM      Result Value Range   Sodium 146 (*) 135 - 145 mEq/L   Potassium 3.1 (*) 3.5 - 5.1 mEq/L   Chloride 112  96 - 112 mEq/L   CO2 23  19 - 32 mEq/L   Glucose, Bld 166 (*) 70 - 99 mg/dL   BUN 55 (*) 6 - 23 mg/dL   Creatinine, Ser 0.27 (*) 0.50 - 1.35 mg/dL   Calcium 8.1 (*) 8.4 - 10.5 mg/dL   GFR calc non Af Amer 37 (*) >90 mL/min   GFR calc Af Amer 42 (*) >90 mL/min   Comment:            The eGFR has been calculated     using the CKD EPI equation.     This calculation has not been     validated in all clinical     situations.     eGFR's persistently     <90 mL/min signify     possible Chronic Kidney Disease.  MAGNESIUM     Status: None   Collection Time    12/22/12  5:30 AM      Result Value Range   Magnesium 2.3  1.5 - 2.5 mg/dL  PHOSPHORUS     Status: None   Collection Time    12/22/12  5:30 AM      Result Value Range   Phosphorus 3.1  2.3 - 4.6 mg/dL  CBC     Status: Abnormal   Collection Time    12/22/12  5:30 AM      Result Value Range   WBC 30.7 (*) 4.0 - 10.5 K/uL   RBC 3.98 (*) 4.22 - 5.81 MIL/uL   Hemoglobin 10.9 (*) 13.0 - 17.0 g/dL   HCT 25.3 (*) 66.4 - 40.3 %   MCV 74.6 (*) 78.0 - 100.0 fL   MCH 27.4  26.0 - 34.0 pg   MCHC 36.7 (*) 30.0 - 36.0 g/dL   RDW 47.4  25.9 - 56.3 %   Platelets 44 (*) 150 - 400 K/uL   Comment: CONSISTENT WITH PREVIOUS RESULT     REPEATED TO VERIFY  LACTIC ACID, PLASMA     Status: None   Collection Time    12/22/12  5:30 AM      Result Value Range   Lactic Acid, Venous 1.3  0.5 - 2.2 mmol/L  PROTIME-INR     Status: None   Collection Time    12/22/12  5:30 AM      Result Value Range   Prothrombin Time 14.4  11.6 - 15.2 seconds   INR 1.14  0.00 - 1.49   US Renal Port  12/20/2012  *RADIOLOGY REPORT*  Clinical  Data: Acute renal failure.  Decreased urine output.  RENAL/URINARY TRACT ULTRASOUND  COMPLETE  Comparison:  CT abdomen pelvis 10/24/2012.  Findings:  Right Kidney:  Moderate right-sided hydronephrosis is present.  The obstructing lesion is not evident on this exam.  The right renal parenchyma is preserved.  It is of normal size and echotexture. The maximal length is 12.7 cm, within normal limits.  Left Kidney:  Isoechoic lesion at the upper pole of the right kidney measures 1.7 x 2.3 x 2.1 cm. Previous CT scan revealed a simple cyst at this location.  There is no shadowing on today's study.  The other renal cysts are not demonstrated.  The parenchyma is within normal limits.  There is no hydronephrosis.  No stones are evident.  Bladder:  A Foley catheter is present within the urinary bladder.  IMPRESSION:  1.  Moderate right-sided hydronephrosis. 2.  Isoechoic lesion of the left kidney may represent a hemorrhagic cyst.  A simple cyst was evident in this location on prior exams.   Original Report Authenticated By: Marin Roberts, [name]    Dg Chest Port 1 View  12/20/2012  *RADIOLOGY REPORT*  Clinical Data: Central line placement  PORTABLE CHEST - 1 VIEW  Comparison: Chest radiograph 03/12 1014  Findings: Interval place right central venous line with tip in distal SVC.  No pneumothorax.  Stable enlarged heart silhouette. There is central venous pulmonary congestion unchanged.  IMPRESSION: Interval placement of right central venous line without pneumothorax.   Original Report Authenticated By: Genevive Bi, [name]    Dg Chest Port 1 View  12/20/2012  *RADIOLOGY REPORT*  Clinical Data: Hypoxia.  Shortness of breath.  Slight mid chest pain.  PORTABLE CHEST - 1 VIEW  Comparison: 09/07/2008  Findings: There is mild chronic cardiomegaly with chronic or recurrent pulmonary vascular prominence.  No infiltrates or effusions.  No acute osseous abnormality.  IMPRESSION: Chronic cardiomegaly with chronic or recurrent pulmonary vascular prominence.   Original Report Authenticated By: Francene Boyers, [name]      Review of Systems  Constitutional: Negative for fever.  Respiratory: Positive for cough. Negative for wheezing.   Cardiovascular: Negative for chest pain.  Gastrointestinal: Negative for nausea and vomiting.  Genitourinary: Positive for flank pain.    Blood pressure 106/67, pulse 66, temperature 97.7 F (36.5 C), temperature source Axillary, resp. rate 25, height 5\' 8"  (1.727 m), weight 248 lb 14.4 oz (112.9 kg), SpO2 96.00%. Physical Exam  Constitutional: He is oriented to person, place, and time. He appears well-developed and well-nourished.  Cardiovascular: Normal rate, regular rhythm and normal heart sounds.   No murmur heard. Respiratory: Effort normal. He has no wheezes.  GI: Soft. Bowel sounds are normal. He exhibits distension.  Musculoskeletal: Normal range of motion.  Neurological: He is alert and oriented to person, place, and time.  Skin: Skin is warm and dry.  Psychiatric: He has a normal mood and affect. His behavior is normal. Judgment and thought content normal.     Assessment/Plan R Hydro Sepsis Hx prostate Ca Scheduled now for R PCN Pt aware of procedure benefits and risks and agreeable to proceed Consent signed and in chart Hep held On Zosyn  TURPIN,PAMELA A 12/22/2012, 7:15 AM

## 2012-12-23 DIAGNOSIS — D696 Thrombocytopenia, unspecified: Secondary | ICD-10-CM

## 2012-12-23 LAB — BASIC METABOLIC PANEL
BUN: 47 mg/dL — ABNORMAL HIGH (ref 6–23)
CO2: 22 mEq/L (ref 19–32)
Calcium: 7.8 mg/dL — ABNORMAL LOW (ref 8.4–10.5)
Chloride: 114 mEq/L — ABNORMAL HIGH (ref 96–112)
Creatinine, Ser: 1.44 mg/dL — ABNORMAL HIGH (ref 0.50–1.35)
GFR calc non Af Amer: 49 mL/min — ABNORMAL LOW (ref >90)
Glucose, Bld: 131 mg/dL — ABNORMAL HIGH (ref 70–99)
Sodium: 146 mEq/L — ABNORMAL HIGH (ref 135–145)

## 2012-12-23 LAB — CBC
HCT: 31.3 % — ABNORMAL LOW (ref 39.0–52.0)
HCT: 30.2 % — ABNORMAL LOW (ref 39.0–52.0)
Hemoglobin: 11.2 g/dL — ABNORMAL LOW (ref 13.0–17.0)
MCH: 27.1 pg (ref 26.0–34.0)
MCHC: 35.8 g/dL (ref 30.0–36.0)
MCHC: 35.6 g/dL (ref 30.0–36.0)
MCHC: 35.4 g/dL (ref 30.0–36.0)
MCV: 74.9 fL — ABNORMAL LOW (ref 78.0–100.0)
MCV: 76.1 fL — ABNORMAL LOW (ref 78.0–100.0)
MCV: 75.7 fL — ABNORMAL LOW (ref 78.0–100.0)
Platelets: 42 10*3/uL — ABNORMAL LOW (ref 150–400)
Platelets: 53 10*3/uL — ABNORMAL LOW (ref 150–400)
RBC: 4.02 MIL/uL — ABNORMAL LOW (ref 4.22–5.81)
RDW: 16 % — ABNORMAL HIGH (ref 11.5–15.5)
RDW: 15.3 % (ref 11.5–15.5)
WBC: 20.1 10*3/uL — ABNORMAL HIGH (ref 4.0–10.5)

## 2012-12-23 LAB — URINE CULTURE
Colony Count: 10000
Colony Count: NO GROWTH

## 2012-12-23 LAB — PHOSPHORUS: Phosphorus: 3.1 mg/dL (ref 2.3–4.6)

## 2012-12-23 MED ORDER — POTASSIUM CHLORIDE 10 MEQ/50ML IV SOLN
10.0000 meq | INTRAVENOUS | Status: AC
  Administered 2012-12-23 (×2): 10 meq via INTRAVENOUS
  Filled 2012-12-23: qty 100

## 2012-12-23 MED ORDER — HYDROCORTISONE SOD SUCCINATE 100 MG IJ SOLR
50.0000 mg | Freq: Two times a day (BID) | INTRAMUSCULAR | Status: DC
  Administered 2012-12-23 – 2012-12-25 (×4): 50 mg via INTRAVENOUS
  Filled 2012-12-23 (×5): qty 1

## 2012-12-23 MED ORDER — SODIUM CHLORIDE 0.9 % IV SOLN
8.0000 mg/h | INTRAVENOUS | Status: DC
  Administered 2012-12-23: 8 mg/h via INTRAVENOUS
  Filled 2012-12-23 (×2): qty 80

## 2012-12-23 NOTE — Progress Notes (Signed)
Hematochezia   Unclear site of bleeding; avoid anticoagulation; cbc q6h  Consult GI

## 2012-12-23 NOTE — Progress Notes (Signed)
Patient had dark red stool x 1. Elink MD, [place], notified.

## 2012-12-23 NOTE — Progress Notes (Signed)
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 CT in jan 2014 showed stable disease but has had rising PSA (currently on Zytiga, Prednisone and Lupron). He was admitted on 3/11 w/ dx of pyelonephritis. Transferred to the ICU on 3/12 in septic shock.   SIGNIFICANT EVENTS / STUDIES:  3/12  Renal US >>> Mod right sided hydro  3/14  Nephrostomy tube placed 3/13  Venous Doppler >>> neg   LINES / TUBES: Right IJ CVL 3/12>>>  CULTURES: 3/11 Urine >>> neg 3/12 Blood >>> 3/12 Urine >>> 3/12 C. diff 3/12 >>> neg 3/14 Urine >>> 3/15 C. dif >>>  ANTIBIOTICS: Flagyl 3/12 >>> 3/13 Zosyn 3/12 >>>  SUBJECTIVE: Weaning pressors. Nephrostomy tube placed 3/14. Bloody stool overnight.  VITAL SIGNS: Temp:  [97.9 F (36.6 C)-98.5 F (36.9 C)] 98 F (36.7 C) (03/14 2100) Pulse Rate:  [55-71] 57 (03/15 0400) Resp:  [13-30] 18 (03/15 0400) BP: (98-120)/(55-85) 110/74 mmHg (03/15 0400) SpO2:  [93 %-100 %] 98 % (03/15 0400) Weight:  [115.6 kg (254 lb 13.6 oz)] 115.6 kg (254 lb 13.6 oz) (03/15 0400)  HEMODYNAMICS: CVP:  [4 mmHg-13 mmHg] 12 mmHg  VENTILATOR SETTINGS:   INTAKE / OUTPUT: Intake/Output     03/14 0701 - 03/15 0700 03/15 0701 - 03/16 0700   I.V. (mL/kg) 2387.4 (20.7)    IV Piggyback 150    Total Intake(mL/kg) 2537.4 (22)    Urine (mL/kg/hr) 900 (0.3)    Total Output 900     Net +1637.4          Urine Occurrence 2 x    Stool Occurrence 1 x      PHYSICAL EXAMINATION: General:  Overwt man, No acute distress.  Neuro:  Awake, no focal def  HEENT:  [place], no JVD Cardiovascular:  rrr Lungs:  Decreased in bases.  Abdomen:  Soft, non-tender + bowel sounds , obese  Musculoskeletal:  Intact  Skin:  + lower  extremity edema   LABS:  Recent Labs Lab 12/19/12 1930  12/19/12 2313 12/20/12 0014  12/20/12 0700 12/20/12 1026 12/20/12 1035 12/20/12 1315 12/20/12 1630 12/21/12 0500 12/21/12 1800 12/22/12 0530 12/23/12 0443 12/23/12 0619  HGB 10.6*  < >  --  11.2*  --  9.0*  --   --   --   --  10.4*  --  10.9*  --  11.2*  WBC 18.4*  --   --  4.0  --  15.7*  --   --   --   --  31.3*  --  30.7*  --  26.4*  PLT 105*  --   --  67*  --  68*  --   --   --   --  60*  --  44*  --  49*  NA  --   < >  --   --   --  144  --   --  144  --  150* 143 146* 146*  --   K  --   < >  --   --   --  2.7*  --   --  3.3*  --  2.8* 3.5 3.1* 3.0*  --   CL  --   < >  --   --   --  105  --   --  107  --  114* 110 112 114*  --   CO2  --   --   --   --   < > 21  --   --  17*  --  19 20 23 22   --   GLUCOSE  --   < >  --   --   --  95  --   --  118*  --  181* 231* 166* 131*  --   BUN  --   < >  --   --   --  69*  --   --  69*  --  59* 56* 55* 47*  --   CREATININE  --   < >  --  4.29*  --  5.19*  --   --  4.64*  --  2.86* 2.11* 1.84* 1.44*  --   CALCIUM  --   --   --   --   < > 7.9*  --   --  7.3*  --  8.0* 8.2* 8.1* 7.8*  --   MG  --   --   --   --   --   --  1.7  --   --   --   --   --  2.3 2.1  --   PHOS  --   --   --   --   --   --   --   --   --   --   --   --  3.1 3.1  --   INR  --   --   --   --   --   --  1.42  --   --   --   --   --  1.14  --   --   APTT  --   --   --   --   --   --  39*  --   --   --   --   --   --   --   --   LATICACIDVEN  --   --  4.2*  --   --   --   --  3.0*  --   --   --   --  1.3  --   --   TROPONINI  --   --   --  <0.30  --   --   --   --   --   --   --   --   --   --   --   O2SAT  --   --   --   --   --   --   --   --   --  65.1  --   --   --   --   --   < > = values in this interval not displayed.  CXR: None today  ASSESSMENT / PLAN:  PULMONARY A:  Mild dyspnea, likely due to sepsis and progressive renal failure, exacerbated by pain. OSA on nocturnal CPAP. P:   Supplemental  oxygen Nocturnal CPAP  CARDIOVASCULAR A: Septic shock secondary to pyelonephritis, now off pressors. P:  Antibiotics per ID section   RENAL A:  Acute renal failure, post-obstructive, improving. Hypokalemia.  Hypernatremia.  Hydronephrosis s/p perc nephrostomy 3/14  P:   Renal dose meds Replace K May need abdomen / pelvis CT Trend BMP  GASTROINTESTINAL A:  Diarrhea.  Some blood in stool overnight but hemodynamically stable and no decrease in Hb, doubt brisk upper bleed, likely hemorrhoidal vs mucosal tear. P:   Diet Repeat C Diff PCR D/C Protonix gtt GI consult  HEMATOLOGIC A:  Anemia, acute on chronic, stable. Thrombocytopenia, etiology unclear. P:  SCDs for DVT Px Trend CBC  INFECTIOUS A:  Pyelonephritis. P:   Cultures / antibiotics as above  ENDOCRINE A:  DM / hyperglycemia.  Presumed adrenal insufficiency.  P:   SSI Stress dose steroids, taper  NEUROLOGIC A:  No active issues. P:   No intervention required  TODAY'S SUMMARY:  67 yo with metastatic prostate CA admitted with septic shock secondary to pyelonephritis and progressive renal failure in setting of hydronephrosis. Improved dramatically after nephrostomy 3/14, off pressors. Diarrhea with negative C.diff.  Some blood in stool, likely mucosal tear, with stable hemoglobin and hemodynamics.  Will ask GI to see.  If remains stable, transfer to SDU under [facility].  PARRETT,TAMMY NP -C  12/23/2012, 7:55 AM [facility] [phone]  I have personally obtained a history, examined the patient, evaluated laboratory and imaging results, formulated the assessment and plan and placed orders.  Lonia Farber, MD Pulmonary and Critical Care Medicine [facility] Pager: [phone]  12/23/2012, 9:25 AM

## 2012-12-23 NOTE — Progress Notes (Signed)
Patient ID: [name], male   DOB: [birth year], 67 y.o.   MRN: [id number]    Subjective: [name] had a right perc tube placed yesterday.  His level of obstruction is in the distal ureter.   He has some pain at the site but is otherwise without complaints.  ROS: Negative except as above  Objective: Vital signs in last 24 hours: Temp:  [97.9 F (36.6 C)-98.5 F (36.9 C)] 98 F (36.7 C) (03/14 2100) Pulse Rate:  [55-71] 57 (03/15 0400) Resp:  [13-30] 18 (03/15 0400) BP: (98-120)/(55-85) 110/74 mmHg (03/15 0400) SpO2:  [93 %-100 %] 98 % (03/15 0400) Weight:  [115.6 kg (254 lb 13.6 oz)] 115.6 kg (254 lb 13.6 oz) (03/15 0400)  Intake/Output from previous day: 03/14 0701 - 03/15 0700 In: 2537.4 [I.V.:2387.4; IV Piggyback:150] Out: 900 [Urine:900] Intake/Output this shift:    General appearance: alert and no distress NT draining blood tinged urine.   Lab Results:   Recent Labs  12/22/12 0530 12/23/12 0619  WBC 30.7* 26.4*  HGB 10.9* 11.2*  HCT 29.7* 31.3*  PLT 44* 49*   BMET  Recent Labs  12/22/12 0530 12/23/12 0443  NA 146* 146*  K 3.1* 3.0*  CL 112 114*  CO2 23 22  GLUCOSE 166* 131*  BUN 55* 47*  CREATININE 1.84* 1.44*  CALCIUM 8.1* 7.8*   PT/INR  Recent Labs  12/20/12 1026 12/22/12 0530  LABPROT 17.0* 14.4  INR 1.42 1.14   ABG No results found for this basename: PHART, PCO2, PO2, HCO3,  in the last 72 hours  Studies/Results: Ir Nephrostogram Right  12/22/2012  *RADIOLOGY REPORT*  Clinical history:67 year old with history of prostate cancer, sepsis and right hydronephrosis.  PROCEDURE(S): ULTRASOUND AND FLUOROSCOPIC GUIDED PLACEMENT OF A RIGHT PERCUTANEOUS NEPHROSTOMY TUBE; RIGHT NEPHROSTOGRAM  Physician: Rachelle Hora. Henn, MD  Medications:Versed 2 mg, Fentanyl 150 mcg. A radiology nurse monitored the patient for moderate sedation.  Moderate sedation time:30 minutes  Fluoroscopy time: 4.7 minutes  Procedure:Informed consent was obtained for a  nephrostomy tube placement.  The patient was placed prone.  The right flank was prepped and draped in a sterile fashion.  Maximal barrier sterile technique was utilized including caps, mask, sterile gowns, sterile gloves, sterile drape, hand hygiene and skin antiseptic.  The skin was anesthetized with 1% lidocaine.  Using ultrasound guidance, a 21 gauge needle was directed into the lower pole calix.  A small amount of cloudy yellow urine was identified.  A small amount of contrast was used to opacify the renal collecting system and a wire was advanced into the renal pelvis.  An Accustick dilator set was placed.  The Bentson wire was advanced into the ureter.  The tract was dilated and a 10-French multipurpose drain was placed.  The patient has a very small renal pelvis.  As a result, the multipurpose drain was exchanged for a [employer] 10-French drain.  The pigtail was reconstituted in the renal pelvis.  Small amount of contrast was injected to confirm placement in the renal pelvis.  Due to the small size of the collecting system, there was also opacification of the right ureter.  Catheter was sutured to the skin and attached to a gravity bag.  Findings:Mild right hydronephrosis.  There appears to be an obstruction of the distal right ureter, probably near the ureterovesical junction.  Decompression of the collecting system following catheter placement.  Only a small amount of cloudy urine was aspirated and this was sent for Gram stain and  culture.  Complications: None  Impression:Successful placement of a percutaneous right nephrostomy tube with image guidance.  Obstruction at the distal right ureter.   Original Report Authenticated By: Richarda Overlie, [name]    Ir Perc Nephrostomy Right  12/22/2012  *RADIOLOGY REPORT*  Clinical history:67 year old with history of prostate cancer, sepsis and right hydronephrosis.  PROCEDURE(S): ULTRASOUND AND FLUOROSCOPIC GUIDED PLACEMENT OF A RIGHT PERCUTANEOUS NEPHROSTOMY TUBE;  RIGHT NEPHROSTOGRAM  Physician: Rachelle Hora. Henn, MD  Medications:Versed 2 mg, Fentanyl 150 mcg. A radiology nurse monitored the patient for moderate sedation.  Moderate sedation time:30 minutes  Fluoroscopy time: 4.7 minutes  Procedure:Informed consent was obtained for a nephrostomy tube placement.  The patient was placed prone.  The right flank was prepped and draped in a sterile fashion.  Maximal barrier sterile technique was utilized including caps, mask, sterile gowns, sterile gloves, sterile drape, hand hygiene and skin antiseptic.  The skin was anesthetized with 1% lidocaine.  Using ultrasound guidance, a 21 gauge needle was directed into the lower pole calix.  A small amount of cloudy yellow urine was identified.  A small amount of contrast was used to opacify the renal collecting system and a wire was advanced into the renal pelvis.  An Accustick dilator set was placed.  The Bentson wire was advanced into the ureter.  The tract was dilated and a 10-French multipurpose drain was placed.  The patient has a very small renal pelvis.  As a result, the multipurpose drain was exchanged for a [employer] 10-French drain.  The pigtail was reconstituted in the renal pelvis.  Small amount of contrast was injected to confirm placement in the renal pelvis.  Due to the small size of the collecting system, there was also opacification of the right ureter.  Catheter was sutured to the skin and attached to a gravity bag.  Findings:Mild right hydronephrosis.  There appears to be an obstruction of the distal right ureter, probably near the ureterovesical junction.  Decompression of the collecting system following catheter placement.  Only a small amount of cloudy urine was aspirated and this was sent for Gram stain and culture.  Complications: None  Impression:Successful placement of a percutaneous right nephrostomy tube with image guidance.  Obstruction at the distal right ureter.   Original Report Authenticated By: Richarda Overlie, [name]    Ir US Guidance  12/22/2012  *RADIOLOGY REPORT*  Clinical history:67 year old with history of prostate cancer, sepsis and right hydronephrosis.  PROCEDURE(S): ULTRASOUND AND FLUOROSCOPIC GUIDED PLACEMENT OF A RIGHT PERCUTANEOUS NEPHROSTOMY TUBE; RIGHT NEPHROSTOGRAM  Physician: Rachelle Hora. Henn, MD  Medications:Versed 2 mg, Fentanyl 150 mcg. A radiology nurse monitored the patient for moderate sedation.  Moderate sedation time:30 minutes  Fluoroscopy time: 4.7 minutes  Procedure:Informed consent was obtained for a nephrostomy tube placement.  The patient was placed prone.  The right flank was prepped and draped in a sterile fashion.  Maximal barrier sterile technique was utilized including caps, mask, sterile gowns, sterile gloves, sterile drape, hand hygiene and skin antiseptic.  The skin was anesthetized with 1% lidocaine.  Using ultrasound guidance, a 21 gauge needle was directed into the lower pole calix.  A small amount of cloudy yellow urine was identified.  A small amount of contrast was used to opacify the renal collecting system and a wire was advanced into the renal pelvis.  An Accustick dilator set was placed.  The Bentson wire was advanced into the ureter.  The tract was dilated and a 10-French multipurpose drain was placed.  The patient has a very small renal pelvis.  As a result, the multipurpose drain was exchanged for a [employer] 10-French drain.  The pigtail was reconstituted in the renal pelvis.  Small amount of contrast was injected to confirm placement in the renal pelvis.  Due to the small size of the collecting system, there was also opacification of the right ureter.  Catheter was sutured to the skin and attached to a gravity bag.  Findings:Mild right hydronephrosis.  There appears to be an obstruction of the distal right ureter, probably near the ureterovesical junction.  Decompression of the collecting system following catheter placement.  Only a small amount of cloudy urine  was aspirated and this was sent for Gram stain and culture.  Complications: None  Impression:Successful placement of a percutaneous right nephrostomy tube with image guidance.  Obstruction at the distal right ureter.   Original Report Authenticated By: Richarda Overlie, [name]     Anti-infectives: Anti-infectives   Start     Dose/Rate Route Frequency Ordered Stop   12/21/12 1300  piperacillin-tazobactam (ZOSYN) IVPB 3.375 g     3.375 g 12.5 mL/hr over 240 Minutes Intravenous 3 times per day 12/21/12 1014     12/20/12 1800  piperacillin-tazobactam (ZOSYN) IVPB 2.25 g  Status:  Discontinued     2.25 g 100 mL/hr over 30 Minutes Intravenous 4 times per day 12/20/12 0954 12/21/12 1014   12/20/12 1245  piperacillin-tazobactam (ZOSYN) IVPB 3.375 g     3.375 g 100 mL/hr over 30 Minutes Intravenous STAT 12/20/12 1229 12/20/12 1321   12/20/12 1000  metroNIDAZOLE (FLAGYL) IVPB 500 mg  Status:  Discontinued     500 mg 100 mL/hr over 60 Minutes Intravenous Every 8 hours 12/20/12 0920 12/21/12 0919   12/20/12 0800  piperacillin-tazobactam (ZOSYN) IVPB 3.375 g  Status:  Discontinued     3.375 g 12.5 mL/hr over 240 Minutes Intravenous 3 times per day 12/20/12 0735 12/20/12 0954   12/20/12 0100  ciprofloxacin (CIPRO) IVPB 400 mg  Status:  Discontinued     400 mg 200 mL/hr over 60 Minutes Intravenous Every 12 hours 12/20/12 0011 12/20/12 0730   12/19/12 2130  cefTRIAXone (ROCEPHIN) 1 g in dextrose 5 % 50 mL IVPB     1 g 100 mL/hr over 30 Minutes Intravenous  Once 12/19/12 2125 12/19/12 2232      Current Facility-Administered Medications  Medication Dose Route Frequency Provider Last Rate Last Dose  . 0.45 % sodium chloride infusion   Intravenous Continuous Leslye Peer, MD 100 mL/hr at 12/22/12 2230    . acetaminophen (TYLENOL) tablet 650 mg  650 mg Oral Q6H PRN Leda Gauze, NP   650 mg at 12/20/12 0426  . hydrocortisone sodium succinate (SOLU-CORTEF) 100 mg/2 mL injection 50 mg  50 mg Intravenous  Q8H Sorin Luanne Bras, MD   50 mg at 12/23/12 0858  . norepinephrine (LEVOPHED) 8 mg in dextrose 5 % 250 mL infusion  2-50 mcg/min Intravenous Titrated Simonne Martinet, NP 3.8 mL/hr at 12/21/12 1800 2 mcg/min at 12/21/12 1800  . ondansetron (ZOFRAN) tablet 4 mg  4 mg Oral Q6H PRN Tarry Kos, MD       Or  . ondansetron [facility]) injection 4 mg  4 mg Intravenous Q6H PRN Tarry Kos, MD      . pantoprazole (PROTONIX) 80 mg in sodium chloride 0.9 % 250 mL infusion  8 mg/hr Intravenous Continuous Roxine Caddy, MD 25 mL/hr at 12/23/12 0747 8 mg/hr at  12/23/12 0747  . piperacillin-tazobactam (ZOSYN) IVPB 3.375 g  3.375 g Intravenous Q8H Leslye Peer, MD   3.375 g at 12/23/12 1610    Assessment: His Cr is declining with right NT placement  Plan: Continue current care.     LOS: 4 days    Aniayah Alaniz J 12/23/2012

## 2012-12-23 NOTE — Progress Notes (Addendum)
Subjective: Pt s/p right PCN yesterday; denies increased rt flank pain; has had some hematochezia  Objective: Vital signs in last 24 hours: Temp:  [97.9 F (36.6 C)-98.5 F (36.9 C)] 98 F (36.7 C) (03/14 2100) Pulse Rate:  [55-71] 57 (03/15 0400) Resp:  [13-30] 18 (03/15 0400) BP: (99-120)/(55-85) 110/74 mmHg (03/15 0400) SpO2:  [93 %-100 %] 98 % (03/15 0400) Weight:  [254 lb 13.6 oz (115.6 kg)] 254 lb 13.6 oz (115.6 kg) (03/15 0400) Last BM Date: 12/22/12  Intake/Output from previous day: 03/14 0701 - 03/15 0700 In: 2537.4 [I.V.:2387.4; IV Piggyback:150] Out: 900 [Urine:900] Intake/Output this shift:    Right PCN intact, about 100 cc's blood-tinged urine in bag; insertion site ok, mildly tender  Lab Results:   Recent Labs  12/22/12 0530 12/23/12 0619  WBC 30.7* 26.4*  HGB 10.9* 11.2*  HCT 29.7* 31.3*  PLT 44* 49*   BMET  Recent Labs  12/22/12 0530 12/23/12 0443  NA 146* 146*  K 3.1* 3.0*  CL 112 114*  CO2 23 22  GLUCOSE 166* 131*  BUN 55* 47*  CREATININE 1.84* 1.44*  CALCIUM 8.1* 7.8*   PT/INR  Recent Labs  12/22/12 0530  LABPROT 14.4  INR 1.14   ABG No results found for this basename: PHART, PCO2, PO2, HCO3,  in the last 72 hours  Studies/Results: Ir Nephrostogram Right  12/22/2012  *RADIOLOGY REPORT*  Clinical history:67 year old with history of prostate cancer, sepsis and right hydronephrosis.  PROCEDURE(S): ULTRASOUND AND FLUOROSCOPIC GUIDED PLACEMENT OF A RIGHT PERCUTANEOUS NEPHROSTOMY TUBE; RIGHT NEPHROSTOGRAM  Physician: Rachelle Hora. Henn, MD  Medications:Versed 2 mg, Fentanyl 150 mcg. A radiology nurse monitored the patient for moderate sedation.  Moderate sedation time:30 minutes  Fluoroscopy time: 4.7 minutes  Procedure:Informed consent was obtained for a nephrostomy tube placement.  The patient was placed prone.  The right flank was prepped and draped in a sterile fashion.  Maximal barrier sterile technique was utilized including caps, mask,  sterile gowns, sterile gloves, sterile drape, hand hygiene and skin antiseptic.  The skin was anesthetized with 1% lidocaine.  Using ultrasound guidance, a 21 gauge needle was directed into the lower pole calix.  A small amount of cloudy yellow urine was identified.  A small amount of contrast was used to opacify the renal collecting system and a wire was advanced into the renal pelvis.  An Accustick dilator set was placed.  The Bentson wire was advanced into the ureter.  The tract was dilated and a 10-French multipurpose drain was placed.  The patient has a very small renal pelvis.  As a result, the multipurpose drain was exchanged for a [employer] 10-French drain.  The pigtail was reconstituted in the renal pelvis.  Small amount of contrast was injected to confirm placement in the renal pelvis.  Due to the small size of the collecting system, there was also opacification of the right ureter.  Catheter was sutured to the skin and attached to a gravity bag.  Findings:Mild right hydronephrosis.  There appears to be an obstruction of the distal right ureter, probably near the ureterovesical junction.  Decompression of the collecting system following catheter placement.  Only a small amount of cloudy urine was aspirated and this was sent for Gram stain and culture.  Complications: None  Impression:Successful placement of a percutaneous right nephrostomy tube with image guidance.  Obstruction at the distal right ureter.   Original Report Authenticated By: Richarda Overlie, [name]    Ir Perc Nephrostomy Right  12/22/2012  *  RADIOLOGY REPORT*  Clinical history:67 year old with history of prostate cancer, sepsis and right hydronephrosis.  PROCEDURE(S): ULTRASOUND AND FLUOROSCOPIC GUIDED PLACEMENT OF A RIGHT PERCUTANEOUS NEPHROSTOMY TUBE; RIGHT NEPHROSTOGRAM  Physician: Rachelle Hora. Henn, MD  Medications:Versed 2 mg, Fentanyl 150 mcg. A radiology nurse monitored the patient for moderate sedation.  Moderate sedation time:30 minutes   Fluoroscopy time: 4.7 minutes  Procedure:Informed consent was obtained for a nephrostomy tube placement.  The patient was placed prone.  The right flank was prepped and draped in a sterile fashion.  Maximal barrier sterile technique was utilized including caps, mask, sterile gowns, sterile gloves, sterile drape, hand hygiene and skin antiseptic.  The skin was anesthetized with 1% lidocaine.  Using ultrasound guidance, a 21 gauge needle was directed into the lower pole calix.  A small amount of cloudy yellow urine was identified.  A small amount of contrast was used to opacify the renal collecting system and a wire was advanced into the renal pelvis.  An Accustick dilator set was placed.  The Bentson wire was advanced into the ureter.  The tract was dilated and a 10-French multipurpose drain was placed.  The patient has a very small renal pelvis.  As a result, the multipurpose drain was exchanged for a [employer] 10-French drain.  The pigtail was reconstituted in the renal pelvis.  Small amount of contrast was injected to confirm placement in the renal pelvis.  Due to the small size of the collecting system, there was also opacification of the right ureter.  Catheter was sutured to the skin and attached to a gravity bag.  Findings:Mild right hydronephrosis.  There appears to be an obstruction of the distal right ureter, probably near the ureterovesical junction.  Decompression of the collecting system following catheter placement.  Only a small amount of cloudy urine was aspirated and this was sent for Gram stain and culture.  Complications: None  Impression:Successful placement of a percutaneous right nephrostomy tube with image guidance.  Obstruction at the distal right ureter.   Original Report Authenticated By: Richarda Overlie, [name]    Ir US Guidance  12/22/2012  *RADIOLOGY REPORT*  Clinical history:67 year old with history of prostate cancer, sepsis and right hydronephrosis.  PROCEDURE(S): ULTRASOUND AND  FLUOROSCOPIC GUIDED PLACEMENT OF A RIGHT PERCUTANEOUS NEPHROSTOMY TUBE; RIGHT NEPHROSTOGRAM  Physician: Rachelle Hora. Henn, MD  Medications:Versed 2 mg, Fentanyl 150 mcg. A radiology nurse monitored the patient for moderate sedation.  Moderate sedation time:30 minutes  Fluoroscopy time: 4.7 minutes  Procedure:Informed consent was obtained for a nephrostomy tube placement.  The patient was placed prone.  The right flank was prepped and draped in a sterile fashion.  Maximal barrier sterile technique was utilized including caps, mask, sterile gowns, sterile gloves, sterile drape, hand hygiene and skin antiseptic.  The skin was anesthetized with 1% lidocaine.  Using ultrasound guidance, a 21 gauge needle was directed into the lower pole calix.  A small amount of cloudy yellow urine was identified.  A small amount of contrast was used to opacify the renal collecting system and a wire was advanced into the renal pelvis.  An Accustick dilator set was placed.  The Bentson wire was advanced into the ureter.  The tract was dilated and a 10-French multipurpose drain was placed.  The patient has a very small renal pelvis.  As a result, the multipurpose drain was exchanged for a [employer] 10-French drain.  The pigtail was reconstituted in the renal pelvis.  Small amount of contrast was injected to confirm placement  in the renal pelvis.  Due to the small size of the collecting system, there was also opacification of the right ureter.  Catheter was sutured to the skin and attached to a gravity bag.  Findings:Mild right hydronephrosis.  There appears to be an obstruction of the distal right ureter, probably near the ureterovesical junction.  Decompression of the collecting system following catheter placement.  Only a small amount of cloudy urine was aspirated and this was sent for Gram stain and culture.  Complications: None  Impression:Successful placement of a percutaneous right nephrostomy tube with image guidance.  Obstruction  at the distal right ureter.   Original Report Authenticated By: Richarda Overlie, [name]     Anti-infectives: Anti-infectives   Start     Dose/Rate Route Frequency Ordered Stop   12/21/12 1300  piperacillin-tazobactam (ZOSYN) IVPB 3.375 g     3.375 g 12.5 mL/hr over 240 Minutes Intravenous 3 times per day 12/21/12 1014     12/20/12 1800  piperacillin-tazobactam (ZOSYN) IVPB 2.25 g  Status:  Discontinued     2.25 g 100 mL/hr over 30 Minutes Intravenous 4 times per day 12/20/12 0954 12/21/12 1014   12/20/12 1245  piperacillin-tazobactam (ZOSYN) IVPB 3.375 g     3.375 g 100 mL/hr over 30 Minutes Intravenous STAT 12/20/12 1229 12/20/12 1321   12/20/12 1000  metroNIDAZOLE (FLAGYL) IVPB 500 mg  Status:  Discontinued     500 mg 100 mL/hr over 60 Minutes Intravenous Every 8 hours 12/20/12 0920 12/21/12 0919   12/20/12 0800  piperacillin-tazobactam (ZOSYN) IVPB 3.375 g  Status:  Discontinued     3.375 g 12.5 mL/hr over 240 Minutes Intravenous 3 times per day 12/20/12 0735 12/20/12 0954   12/20/12 0100  ciprofloxacin (CIPRO) IVPB 400 mg  Status:  Discontinued     400 mg 200 mL/hr over 60 Minutes Intravenous Every 12 hours 12/20/12 0011 12/20/12 0730   12/19/12 2130  cefTRIAXone (ROCEPHIN) 1 g in dextrose 5 % 50 mL IVPB     1 g 100 mL/hr over 30 Minutes Intravenous  Once 12/19/12 2125 12/19/12 2232      Assessment/Plan: s/p right PCN 3/14; check urine cx's/cytology; monitor labs (declining plt count); other plans as per CCM/urology; GI consult  LOS: 4 days    ALLRED,D [facility] 12/23/2012

## 2012-12-23 NOTE — Progress Notes (Signed)
Hyperkalemia   K repalced

## 2012-12-24 LAB — APTT: aPTT: 30 seconds (ref 24–37)

## 2012-12-24 LAB — BASIC METABOLIC PANEL
CO2: 22 mEq/L (ref 19–32)
Chloride: 111 mEq/L (ref 96–112)
GFR calc Af Amer: 66 mL/min — ABNORMAL LOW (ref >90)
Potassium: 3.3 mEq/L — ABNORMAL LOW (ref 3.5–5.1)

## 2012-12-24 LAB — CBC
HCT: 31 % — ABNORMAL LOW (ref 39.0–52.0)
Hemoglobin: 10.8 g/dL — ABNORMAL LOW (ref 13.0–17.0)
MCV: 76.2 fL — ABNORMAL LOW (ref 78.0–100.0)
RBC: 4.07 MIL/uL — ABNORMAL LOW (ref 4.22–5.81)
WBC: 16.5 10*3/uL — ABNORMAL HIGH (ref 4.0–10.5)

## 2012-12-24 MED ORDER — CIPROFLOXACIN IN D5W 400 MG/200ML IV SOLN
400.0000 mg | Freq: Two times a day (BID) | INTRAVENOUS | Status: DC
  Administered 2012-12-24 – 2012-12-27 (×7): 400 mg via INTRAVENOUS
  Filled 2012-12-24 (×10): qty 200

## 2012-12-24 MED ORDER — POTASSIUM CHLORIDE 10 MEQ/50ML IV SOLN
10.0000 meq | INTRAVENOUS | Status: AC
  Administered 2012-12-24 (×2): 10 meq via INTRAVENOUS
  Filled 2012-12-24 (×2): qty 50

## 2012-12-24 MED ORDER — POTASSIUM CHLORIDE CRYS ER 20 MEQ PO TBCR
40.0000 meq | EXTENDED_RELEASE_TABLET | Freq: Once | ORAL | Status: AC
  Administered 2012-12-24: 40 meq via ORAL
  Filled 2012-12-24: qty 2

## 2012-12-24 NOTE — Progress Notes (Signed)
PT was placed on CPAP with room air. Full face mask

## 2012-12-24 NOTE — Progress Notes (Addendum)
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 He states that he began developing chills and fevers about 8 days ago. He was having diarrhea for a few days prior to this. He was evaluated by his PCP and a UA and lab work was performed. Resulted revealed that he was in acute renal failure and therefore was called by his PCP to go to the ER.   He was admitted to the ICU service for ARF and septic shock. Further evaluation revealed right hydronephrosis with obstruction at distal ureter near UVJ. He underwent placement of a right perc drain by radiology on 3/14.  Assessment/Plan: Principal Problem:   Severe sepsis with septic shock - Due to UTI/ pyelo with pan-sensitive proteus -switch Zosyn to Cipro today -will allow urology decide on duration of antibiotics  Active Problems:     Acute renal failure -obstructive uropathy as mentioned above- etiology suspected to be retroperitoneal adenopathy - Urology mentions possibility of internalizing nephrostomy tube at a later date   Prostate cancer -Cont Zytiga, Lupron and steroids (currently on stress dose hydrocortisone)    Dehydration -Cont IV hydration  Hypernatermia improved with 1/2 NS  Hypokalemia Replace and recheck in a.m.    Diarrhea -Ongoing for almost 2 wks -Monitor stool outpt and record in epic -GI consult requested by PCCM -C. Diff negative    Hypertension -Norvasc and Hyzaar on hold    Sleep apnea CPAP at bedtime    Thrombocytopenia, unspecified -Likely secondary to sepsis -Heparin was discontinued on the 13th -Lovenox was given on the 14th only - improving     Code Status: full code  Family Communication: none     Disposition  Plan: follow in SDU  Consultants: Urology  Procedures: Nephrostomy tube 3/14 Lower extremity venous Dopplers 3/13-negative  Antibiotics: Rocephin 3/11 Ciprofloxacin 3/12, resumed on 3/16 Flagyl 3/12-3/13 Zosyn 3/12-3/60  DVT prophylaxis: SCDs  HPI/Subjective: The patient is alert and sitting up in a chair. He currently has no complaints. He tells me that he continues to have loose stools which are nonbloody. No abdominal pain or nausea. No trouble tolerating a regular diet.  Objective: Blood pressure 115/68, pulse 66, temperature 98 F (36.7 C), temperature source Oral, resp. rate 23, height 5\' 8"  (1.727 m), weight 118.7 kg (261 lb 11 oz), SpO2 98.00%.  Intake/Output Summary (Last 24 hours) at 12/24/12 1112 Last data filed at 12/24/12 0900  Gross per 24 hour  Intake   3050 ml  Output   1390 ml  Net   1660 ml     Exam: General: Awake alert oriented x3 No acute respiratory distress Lungs: Clear to auscultation bilaterally without wheezes or crackles Cardiovascular: Regular rate and rhythm without murmur gallop or rub normal S1 and S2 Abdomen: Nontender, nondistended, soft, bowel sounds positive, no rebound, no ascites, no appreciable mass Nephrostomy tube in right flank Extremities: No significant cyanosis, clubbing, or edema bilateral lower extremities  Data Reviewed: Basic Metabolic Panel:  Recent Labs Lab 12/20/12 0700 12/20/12 1026  12/21/12 0500 12/21/12 1800 12/22/12 0530 12/23/12 0443 12/24/12 0428  NA 144  --   < > 150* 143 146* 146* 145  K 2.7*  --   < > 2.8* 3.5 3.1* 3.0* 3.3*  CL 105  --   < >  114* 110 112 114* 111  CO2 21  --   < > 19 20 23 22 22   GLUCOSE 95  --   < > 181* 231* 166* 131* 146*  BUN 69*  --   < > 59* 56* 55* 47* 37*  CREATININE 5.19*  --   < > 2.86* 2.11* 1.84* 1.44* 1.27  CALCIUM 7.9*  --   < > 8.0* 8.2* 8.1* 7.8* 7.9*  MG  --  1.7  --   --   --  2.3 2.1  --   PHOS  --   --   --   --   --  3.1 3.1  --   < > = values in this  interval not displayed. Liver Function Tests: No results found for this basename: AST, ALT, ALKPHOS, BILITOT, PROT, ALBUMIN,  in the last 168 hours No results found for this basename: LIPASE, AMYLASE,  in the last 168 hours No results found for this basename: AMMONIA,  in the last 168 hours CBC:  Recent Labs Lab 12/19/12 1930  12/22/12 0530 12/23/12 0619 12/23/12 1140 12/23/12 1755 12/24/12 0900  WBC 18.4*  < > 30.7* 26.4* 22.7* 20.1* 16.5*  NEUTROABS 15.8*  --   --   --   --   --   --   HGB 10.6*  < > 10.9* 11.2* 10.9* 10.7* 10.8*  HCT 29.6*  < > 29.7* 31.3* 30.6* 30.2* 31.0*  MCV 74.6*  < > 74.6* 74.9* 76.1* 75.7* 76.2*  PLT 105*  < > 44* 49* 42* 53* 62*  < > = values in this interval not displayed. Cardiac Enzymes:  Recent Labs Lab 12/20/12 0014  TROPONINI <0.30   BNP (last 3 results) No results found for this basename: PROBNP,  in the last 8760 hours CBG:  Recent Labs Lab 12/20/12 1125 12/21/12 0856  GLUCAP 114* 149*    Recent Results (from the past 240 hour(s))  URINE CULTURE     Status: None   Collection Time    12/19/12  8:52 PM      Result Value Range Status   Specimen Description URINE, CLEAN CATCH   Final   Special Requests NONE   Final   Culture  Setup Time 12/20/2012 02:15   Final   Colony Count 8,000 COLONIES/ML   Final   Culture INSIGNIFICANT GROWTH   Final   Report Status 12/20/2012 FINAL   Final  MRSA PCR SCREENING     Status: None   Collection Time    12/20/12 12:01 AM      Result Value Range Status   MRSA by PCR NEGATIVE  NEGATIVE Final   Comment:            The GeneXpert MRSA Assay (FDA     approved for NASAL specimens     only), is one component of a     comprehensive MRSA colonization     surveillance program. It is not     intended to diagnose MRSA     infection nor to guide or     monitor treatment for     MRSA infections.  CULTURE, BLOOD (ROUTINE X 2)     Status: None   Collection Time    12/20/12  7:00 AM      Result Value  Range Status   Specimen Description BLOOD HAND LEFT   Final   Special Requests BOTTLES DRAWN AEROBIC AND ANAEROBIC 10CC   Final   Culture  Setup Time  12/20/2012 13:46   Final   Culture     Final   Value:        BLOOD CULTURE RECEIVED NO GROWTH TO DATE CULTURE WILL BE HELD FOR 5 DAYS BEFORE ISSUING A FINAL NEGATIVE REPORT   Report Status PENDING   Incomplete  CULTURE, BLOOD (ROUTINE X 2)     Status: None   Collection Time    12/20/12  7:10 AM      Result Value Range Status   Specimen Description BLOOD HAND LEFT   Final   Special Requests BOTTLES DRAWN AEROBIC AND ANAEROBIC 10CC   Final   Culture  Setup Time 12/20/2012 13:46   Final   Culture     Final   Value:        BLOOD CULTURE RECEIVED NO GROWTH TO DATE CULTURE WILL BE HELD FOR 5 DAYS BEFORE ISSUING A FINAL NEGATIVE REPORT   Report Status PENDING   Incomplete  CLOSTRIDIUM DIFFICILE BY PCR     Status: None   Collection Time    12/20/12  9:54 AM      Result Value Range Status   C difficile by pcr NEGATIVE  NEGATIVE Final  URINE CULTURE     Status: None   Collection Time    12/20/12 11:34 AM      Result Value Range Status   Specimen Description URINE, CATHETERIZED   Final   Special Requests NONE   Final   Culture  Setup Time 12/20/2012 13:03   Final   Colony Count 10,000 COLONIES/ML   Final   Culture PROTEUS MIRABILIS   Final   Report Status 12/23/2012 FINAL   Final   Organism ID, Bacteria PROTEUS MIRABILIS   Final  URINE CULTURE     Status: None   Collection Time    12/22/12  4:09 PM      Result Value Range Status   Specimen Description URINE, CATHETERIZED FROM RIGHT RENAL PELVIS   Final   Special Requests NONE   Final   Culture  Setup Time 12/21/2012 16:38   Final   Colony Count NO GROWTH   Final   Culture NO GROWTH   Final   Report Status 12/23/2012 FINAL   Final     Studies:  Recent x-ray studies have been reviewed in detail by the Attending Physician  Scheduled Meds:  Scheduled Meds: . ciprofloxacin  400 mg  Intravenous Q12H  . hydrocortisone sod succinate (SOLU-CORTEF) injection  50 mg Intravenous Q12H   Continuous Infusions: . sodium chloride 100 mL/hr at 12/24/12 1610    Time spent on care of this patient: 35 minutes   [facility]  [facility] Office  [phone] Pager - Text Page per Loretha Stapler as per below:  On-Call/Text Page:      [email or web address]      password [email or web address]  If 7PM-7AM, please contact night-coverage [email or web address] Password [email or web address] 12/24/2012, 11:12 AM   LOS: 5 days

## 2012-12-24 NOTE — Progress Notes (Signed)
Pt report received from Maralyn Sago, RN on 2100.  Pt transferred via chair, IV, tele by RN and NT.  Salomon Mast, RN

## 2012-12-24 NOTE — Progress Notes (Signed)
[facility] ADULT ICU REPLACEMENT PROTOCOL FOR AM LAB REPLACEMENT ONLY  The patient does apply for the [facility] Adult ICU Electrolyte Replacment Protocol based on the criteria listed below:   1. Is GFR >/= 40 ml/min? yes  Patient's GFR today is 57 2. Is urine output >/= 0.5 ml/kg/hr for the last 6 hours? yes Patient's UOP is 0.56 ml/kg/hr 3. Is BUN < 60 mg/dL? yes  Patient's BUN today is 37 4. Abnormal electrolyte(s): K 3.3 5. Ordered repletion with: per protocol 6. If a panic level lab has been reported, has the CCM MD in charge been notified? no.   Physician:    Markus Daft A 12/24/2012 6:17 AM

## 2012-12-25 LAB — BASIC METABOLIC PANEL
BUN: 25 mg/dL — ABNORMAL HIGH (ref 6–23)
CO2: 25 mEq/L (ref 19–32)
Chloride: 114 mEq/L — ABNORMAL HIGH (ref 96–112)
GFR calc Af Amer: >90 mL/min (ref >90)
Glucose, Bld: 114 mg/dL — ABNORMAL HIGH (ref 70–99)
Potassium: 3.2 mEq/L — ABNORMAL LOW (ref 3.5–5.1)

## 2012-12-25 LAB — URINE CULTURE

## 2012-12-25 MED ORDER — PREDNISONE 20 MG PO TABS
20.0000 mg | ORAL_TABLET | Freq: Two times a day (BID) | ORAL | Status: DC
  Administered 2012-12-25 – 2012-12-27 (×4): 20 mg via ORAL
  Filled 2012-12-25 (×5): qty 1

## 2012-12-25 MED ORDER — POTASSIUM CHLORIDE CRYS ER 20 MEQ PO TBCR
40.0000 meq | EXTENDED_RELEASE_TABLET | Freq: Two times a day (BID) | ORAL | Status: AC
  Administered 2012-12-25 – 2012-12-26 (×3): 40 meq via ORAL
  Filled 2012-12-25 (×3): qty 2

## 2012-12-25 NOTE — Progress Notes (Signed)
  Subjective: R PCN placed 3/14 Doing well Up on side of bed  Objective: Vital signs in last 24 hours: Temp:  [97.3 F (36.3 C)-98.7 F (37.1 C)] 98.7 F (37.1 C) (03/17 1207) Pulse Rate:  [51-67] 61 (03/17 1207) Resp:  [14-28] 25 (03/17 1207) BP: (106-139)/(60-83) 127/64 mmHg (03/17 1207) SpO2:  [36 %-100 %] 96 % (03/17 1207) FiO2 (%):  [30 %-100 %] 30 % (03/17 0500) Weight:  [252 lb 13.9 oz (114.7 kg)-253 lb 4.9 oz (114.9 kg)] 253 lb 4.9 oz (114.9 kg) (03/17 0500) Last BM Date: 12/24/12  Intake/Output from previous day: 03/16 0701 - 03/17 0700 In: 3130 [P.O.:880; I.V.:1800; IV Piggyback:450] Out: 2095 [Urine:2095] Intake/Output this shift: Total I/O In: 560 [P.O.:360; I.V.:200] Out: 452 [Urine:451; Stool:1]  PE: afeb; vss Output clear yellow 420 cc yesterday; 200 cc so far today Site clean and dry Wbc 16.5 plt 62 Bun/cr 25/0.97  Lab Results:   Recent Labs  12/23/12 1755 12/24/12 0900  WBC 20.1* 16.5*  HGB 10.7* 10.8*  HCT 30.2* 31.0*  PLT 53* 62*   BMET  Recent Labs  12/24/12 0428 12/25/12 0500  NA 145 147*  K 3.3* 3.2*  CL 111 114*  CO2 22 25  GLUCOSE 146* 114*  BUN 37* 25*  CREATININE 1.27 0.97  CALCIUM 7.9* 8.0*   PT/INR No results found for this basename: LABPROT, INR,  in the last 72 hours ABG No results found for this basename: PHART, PCO2, PO2, HCO3,  in the last 72 hours  Studies/Results: No results found.  Anti-infectives:   Assessment/Plan: s/p * No surgery found *   LOS: 6 days  R PCN in place Output great Plan per Uro ? Internalize drain?  Supriya Beaston A 12/25/2012

## 2012-12-25 NOTE — Progress Notes (Signed)
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 He stated that he began developing chills and fevers about 8 days prior to his admit. He was having diarrhea for a few days prior to that. He was evaluated by his PCP and a UA and lab work was performed. Results revealed that he was in acute renal failure and therefore he was instructed by his PCP to go to the ER.   Pt was admitted by [facility] on 3/11 w/ a diagnosis of pyelonephritis.  On 3/12 he was recognized to be in severe sepsis.  PCCM was consulted and the pt was transferred to the ICU.  Further evaluation revealed right hydronephrosis with obstruction at distal ureter near UVJ. He underwent placement of a right perc drain by radiology on 3/14.  Assessment/Plan:  Severe sepsis progressing to septic shock - urinary source of infection -due to UTI/ pyelo with pan-sensitive proteus -switch Zosyn to Cipro 3/16 -Urology to decide on duration of antibiotics -clinically improving nicely  Proteus pyelonephritis -as discussed above  Acute renal failure w/ R distal ureteral obstruction  -obstructive uropathy as mentioned above - etiology suspected to be retroperitoneal adenopathy -Urology mentions possibility of internalizing nephrostomy tube at a later date -renal function has now normalized/fully recovered  Prostate cancer stage IV -Cont Zytiga, Lupron and steroids (currently on stress dose hydrocortisone) - begin to wean stress dose steroids back toward home regimen of prednisone 5mg  QD   Chronic steroid use begin to wean stress dose steroids back toward home regimen of prednisone 5mg  QD  Dehydration -resolved - now trending toward volume overload w/ periph edema  - slow IVF and follow Is/Os - may require gently diuresis over next 24hrs   Hypernatermia Push free water via mouth - slow IVF as discussed above - follow trend - likely related to diarrhea  Hypokalemia Cont to replace and recheck in a.m. - Mg has been confirmed to be normal   Diarrhea -Ongoing for almost 2 wks -Monitor stool outpt and record in epic -C. Diff negative -likely due to pyelonephritis itself  Hx of Hypertension -Norvasc and Hyzaar on hold - not an active problem at this time   Sleep apnea CPAP at bedtime  Thrombocytopenia, unspecified -Likely secondary to sepsis -Heparin was discontinued on the 13th -Lovenox was given on the 14th only -improving w/ no signs of blood loss  Code Status: FULL  Family Communication: spoke directly w/ patient      Disposition Plan: stable for transfer to medical bed - begin PT/OT  Consultants: Urology PCCM  Procedures: 3/12 - Right IJ CVL 3/14 - R percutaneous nephrostomy tube per IR  3/13 - Lower extremity venous Dopplers - negative  Antibiotics: Rocephin 3/11 Ciprofloxacin 3/12, resumed on 3/16 >> Flagyl 3/12-3/13 Zosyn 3/12-3/16  DVT prophylaxis: SCDs  HPI/Subjective: The patient is alert and oriented.  He has no complaints today.  He denies chest pain fevers chills nausea vomiting or abdominal pain.  Objective: Blood pressure 127/64, pulse 61, temperature 98.7 F (37.1 C), temperature source Oral, resp. rate 25, height 5\' 8"  (1.727 m), weight 114.9 kg (253 lb 4.9 oz), SpO2 96.00%.  Intake/Output Summary (Last 24 hours) at 12/25/12 1259 Last data filed at 12/25/12 1212  Gross per 24 hour  Intake  2300 ml  Output   1972 ml  Net    328 ml   Exam: General: Awake alert oriented x3 - No acute respiratory distress Lungs: Clear to auscultation bilaterally without wheezes or crackles Cardiovascular: Regular rate and rhythm without murmur gallop or rub normal S1 and S2 Abdomen: Nontender, nondistended, soft, bowel  sounds positive, no rebound, no ascites, no appreciable mass - nephrostomy tube in right flank Extremities: No significant cyanosis, or clubbing;  2+ edema bilateral lower extremities  Data Reviewed: Basic Metabolic Panel:  Recent Labs Lab 12/20/12 0700 12/20/12 1026  12/21/12 1800 12/22/12 0530 12/23/12 0443 12/24/12 0428 12/25/12 0500  NA 144  --   < > 143 146* 146* 145 147*  K 2.7*  --   < > 3.5 3.1* 3.0* 3.3* 3.2*  CL 105  --   < > 110 112 114* 111 114*  CO2 21  --   < > 20 23 22 22 25   GLUCOSE 95  --   < > 231* 166* 131* 146* 114*  BUN 69*  --   < > 56* 55* 47* 37* 25*  CREATININE 5.19*  --   < > 2.11* 1.84* 1.44* 1.27 0.97  CALCIUM 7.9*  --   < > 8.2* 8.1* 7.8* 7.9* 8.0*  MG  --  1.7  --   --  2.3 2.1  --   --   PHOS  --   --   --   --  3.1 3.1  --   --   < > = values in this interval not displayed. CBC:  Recent Labs Lab 12/19/12 1930  12/22/12 0530 12/23/12 0619 12/23/12 1140 12/23/12 1755 12/24/12 0900  WBC 18.4*  < > 30.7* 26.4* 22.7* 20.1* 16.5*  NEUTROABS 15.8*  --   --   --   --   --   --   HGB 10.6*  < > 10.9* 11.2* 10.9* 10.7* 10.8*  HCT 29.6*  < > 29.7* 31.3* 30.6* 30.2* 31.0*  MCV 74.6*  < > 74.6* 74.9* 76.1* 75.7* 76.2*  PLT 105*  < > 44* 49* 42* 53* 62*  < > = values in this interval not displayed. Cardiac Enzymes:  Recent Labs Lab 12/20/12 0014  TROPONINI <0.30   CBG:  Recent Labs Lab 12/20/12 1125 12/21/12 0856  GLUCAP 114* 149*    Recent Results (from the past 240 hour(s))  URINE CULTURE     Status: None   Collection Time    12/19/12  8:52 PM      Result Value Range Status   Specimen Description URINE, CLEAN CATCH   Final   Special Requests NONE   Final   Culture  Setup Time 12/20/2012 02:15   Final   Colony Count 8,000 COLONIES/ML   Final   Culture INSIGNIFICANT GROWTH   Final   Report Status 12/20/2012 FINAL   Final  MRSA PCR SCREENING     Status: None   Collection Time    12/20/12 12:01 AM      Result Value Range Status    MRSA by PCR NEGATIVE  NEGATIVE Final   Comment:            The GeneXpert MRSA Assay (FDA     approved for NASAL specimens     only), is one component of a     comprehensive MRSA colonization     surveillance program. It is not     intended to diagnose MRSA  infection nor to guide or     monitor treatment for     MRSA infections.  CULTURE, BLOOD (ROUTINE X 2)     Status: None   Collection Time    12/20/12  7:00 AM      Result Value Range Status   Specimen Description BLOOD HAND LEFT   Final   Special Requests BOTTLES DRAWN AEROBIC AND ANAEROBIC 10CC   Final   Culture  Setup Time 12/20/2012 13:46   Final   Culture     Final   Value:        BLOOD CULTURE RECEIVED NO GROWTH TO DATE CULTURE WILL BE HELD FOR 5 DAYS BEFORE ISSUING A FINAL NEGATIVE REPORT   Report Status PENDING   Incomplete  CULTURE, BLOOD (ROUTINE X 2)     Status: None   Collection Time    12/20/12  7:10 AM      Result Value Range Status   Specimen Description BLOOD HAND LEFT   Final   Special Requests BOTTLES DRAWN AEROBIC AND ANAEROBIC 10CC   Final   Culture  Setup Time 12/20/2012 13:46   Final   Culture     Final   Value:        BLOOD CULTURE RECEIVED NO GROWTH TO DATE CULTURE WILL BE HELD FOR 5 DAYS BEFORE ISSUING A FINAL NEGATIVE REPORT   Report Status PENDING   Incomplete  CLOSTRIDIUM DIFFICILE BY PCR     Status: None   Collection Time    12/20/12  9:54 AM      Result Value Range Status   C difficile by pcr NEGATIVE  NEGATIVE Final  URINE CULTURE     Status: None   Collection Time    12/20/12 11:34 AM      Result Value Range Status   Specimen Description URINE, CATHETERIZED   Final   Special Requests NONE   Final   Culture  Setup Time 12/20/2012 13:03   Final   Colony Count 10,000 COLONIES/ML   Final   Culture PROTEUS MIRABILIS   Final   Report Status 12/23/2012 FINAL   Final   Organism ID, Bacteria PROTEUS MIRABILIS   Final  URINE CULTURE     Status: None   Collection Time    12/22/12  4:09 PM       Result Value Range Status   Specimen Description URINE, CATHETERIZED FROM RIGHT RENAL PELVIS   Final   Special Requests NONE   Final   Culture  Setup Time 12/21/2012 16:38   Final   Colony Count NO GROWTH   Final   Culture NO GROWTH   Final   Report Status 12/23/2012 FINAL   Final  URINE CULTURE     Status: None   Collection Time    12/23/12  1:40 PM      Result Value Range Status   Specimen Description URINE, RANDOM   Final   Special Requests NONE   Final   Culture  Setup Time 12/23/2012 21:33   Final   Colony Count NO GROWTH   Final   Culture NO GROWTH   Final   Report Status 12/25/2012 FINAL   Final     Studies:  Recent x-ray studies have been reviewed in detail by the Attending Physician  Scheduled Meds:  Scheduled Meds: . ciprofloxacin  400 mg Intravenous Q12H  . hydrocortisone sod succinate (SOLU-CORTEF) injection  50 mg Intravenous Q12H   Continuous Infusions: . sodium chloride 100 mL/hr (12/25/12 0731)  Time spent on care of this patient: 35 minutes   [facility]  [facility] Office  [phone] Pager - Text Page per Loretha Stapler as per below:  On-Call/Text Page:      [email or web address]      password [email or web address]  If 7PM-7AM, please contact night-coverage [email or web address] Password [facility] 12/25/2012, 12:59 PM   LOS: 6 days

## 2012-12-25 NOTE — Progress Notes (Signed)
After pt had been bathed, CPAP was reapplied but at 0350, pt's O2 sats began to drop.  The monitor showed that the patient's sats dropped as low as 36% at 0355, with a good wave form.  During this time, pt remained alert and oriented, with no change in color, no shortness of breath and no discomfort of any kind. Pt was placed on NRB mask at 100% at 0356 and his O2 sats responded immediately and continued to improve.  Pt sats were 100% at 0359.  Dr. Frederico Hamman was notified of above and she advised to begin titrating pt's O2 down, starting with Venti mask at 50%.  This was done at 0417 and pt's O2 sats have remained 100%. Pt continues to deny any discomfort or sob.  Will continue to titrate down as pt's tolerance permits and to monitor for s/s of hypoxia.

## 2012-12-26 DIAGNOSIS — E860 Dehydration: Secondary | ICD-10-CM

## 2012-12-26 LAB — CULTURE, BLOOD (ROUTINE X 2): Culture: NO GROWTH

## 2012-12-26 LAB — CBC
Platelets: 114 10*3/uL — ABNORMAL LOW (ref 150–400)
RBC: 3.47 MIL/uL — ABNORMAL LOW (ref 4.22–5.81)
RDW: 16.2 % — ABNORMAL HIGH (ref 11.5–15.5)
WBC: 12.1 10*3/uL — ABNORMAL HIGH (ref 4.0–10.5)

## 2012-12-26 LAB — BASIC METABOLIC PANEL
Calcium: 7.8 mg/dL — ABNORMAL LOW (ref 8.4–10.5)
Chloride: 114 mEq/L — ABNORMAL HIGH (ref 96–112)
Creatinine, Ser: 0.9 mg/dL (ref 0.50–1.35)
GFR calc Af Amer: >90 mL/min (ref >90)
Sodium: 146 mEq/L — ABNORMAL HIGH (ref 135–145)

## 2012-12-26 MED ORDER — FUROSEMIDE 10 MG/ML IJ SOLN
40.0000 mg | Freq: Every day | INTRAMUSCULAR | Status: DC
  Administered 2012-12-26 – 2012-12-27 (×2): 40 mg via INTRAVENOUS
  Filled 2012-12-26 (×2): qty 4

## 2012-12-26 MED ORDER — SODIUM CHLORIDE 0.9 % IJ SOLN
10.0000 mL | INTRAMUSCULAR | Status: DC | PRN
  Administered 2012-12-26 (×2): 10 mL
  Administered 2012-12-27: 20 mL

## 2012-12-26 MED ORDER — PREDNISONE 20 MG PO TABS
30.0000 mg | ORAL_TABLET | Freq: Every day | ORAL | Status: DC
  Filled 2012-12-26: qty 1

## 2012-12-26 MED ORDER — LOPERAMIDE HCL 2 MG PO CAPS
2.0000 mg | ORAL_CAPSULE | Freq: Three times a day (TID) | ORAL | Status: DC
  Administered 2012-12-26 – 2012-12-27 (×3): 2 mg via ORAL
  Filled 2012-12-26 (×5): qty 1

## 2012-12-26 NOTE — Progress Notes (Signed)
Utilization review completed.  

## 2012-12-26 NOTE — Progress Notes (Signed)
[facility] PROGRESS NOTE  NIAM NEPOMUCENO WUJ:[id number] DOB: 15-Feb-1946 DOA: 12/19/2012 PCP: Thora Lance, MD  Assessment/Plan: Principal Problem:   Severe sepsis with septic shock Active Problems:   Prostate cancer   Acute renal failure   UTI (lower urinary tract infection)   Dehydration   Diarrhea   Hypertension   Sleep apnea   Thrombocytopenia, unspecified    1. Severe sepsis/septic shock: Patient presented with fever, chills, wcc of 18.4 and positive urinary sediment, consistent with UTI. Septic work up was carried and empiricbroad-spectrum antibiotic coverage commenced, for acute pyelonephritis. He subsequently went int septic shock, and was transferred to [facility] service on 12/20/12. PCCM consultation was provided by Dr Koren Bound. Patient was initially managed with IV Zosyn, but as urine cultures grew pan-sensitive Proteus Mirabilis, he was transitioned to monotherapy with ciprofloxacin, effective 12/24/12. Shock was managed with iv fluids and pressors, and clinical response was satisfactory. Patient was returned to [facility] on 12/24/12. Aiming a total of 14 days antibiotic treatment, to be concluded on 01/01/13. Sepsis is now resolved. Patient is apyrexial, and wcc has trended down nicely.  2. Proteus Mirabilis pyelonephritis: This is the etiology for patient's sepsis. See above discussion.   3. Dehydration/Acute renal failure w/R distal ureteral obstruction: On presentation, patient was in ARF, with creatinine 4.90, BUN 61, against a known baseline creatinine of 0.9-1.2 in 1-11/2012. This was multifactorial, secondary to sepsis/volume depletion and obstructive uropathy and right hydronephrosis, suspected due retroperitoneal adenopathy Managed as described above. Dr Marcine Matar provided urology consultation, and Dr Lowella Dandy, IR, placed a percutaneous nephrostomy on 12/22/12. ARF has resolved, and creatinine is 0.90 today. Urology mentioned possibility of internalizing nephrostomy  tube at a later date.  4. 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 He will continue follow up, on discharge.  5. Chronic steroid use: Pre-admission, patient was on Prednisone, 5 mg daily. During his hospitalization, he was managed with stress doses of steroids. Now tapering down to pre-admission maintenance dose.  6. Hypokalemia: Repleting as indicated. Mg has been confirmed to be normal. Following lytes.  7. Diarrhea: Patient has had diarrhea for almost 2 weeks, and this was present on admission. Likely viral in etiology, perpetuated by antibiotics. Fortunately, C. Difficle PCR is negative. Will manage symptomatically with Loperamide.  8. Hx of Hypertension: Norvasc and Hyzaar were placed on hold, due to hypotension. BP is now creeping up, so will restart Norvasc today.  9. Sleep apnea: Stable on nocturnal CPAP. 10. Thrombocytopenia, unspecified: Likely secondary to sepsis. Heparin was discontinued on 12/21/12 and Lovenox was given on 12/22/12 only. Improving gradually.  11. Anasarca: Patient looked markedly anasacic and volume overloaded on physical exam on 01/26/13. This is likely due to aggressive iv fluid resuscitation. Will check serum albumin and administer iv Lasix judiciously. Check 2D echocardiogram.      Code Status: Full Code.  Family Communication:  Disposition Plan: To be determined.   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 He was evaluated by his PMD and a UA and lab work was performed. Results revealed that he was in acute renal failure and therefore he was instructed by his PCP to go to  the ED. Patient was  admitted by [facility] on 12/19/12, with a diagnosis of pyelonephritis. On 12/20/12, he was recognized to be in severe sepsis. PCCM was consulted and the patient was transferred to the ICU. Further evaluation revealed right hydronephrosis with obstruction at distal ureter near UVJ. He underwent placement of a right percutaneous drain by IR on 12/22/12   Consultants:  Dr Koren Bound, PCCM.  Dr Marcine Matar, urologist.   Dr Richarda Overlie, IR.   Procedures:  Percutaneous right nephrostomy tube placement 12/22/12  Antibiotics:  Zosyn 12/20/12-12/23/12.  Ciprofloxacin 12/24/12>>>  HPI/Subjective: Feels much better. No new issues.   Objective: Vital signs in last 24 hours: Temp:  [97.3 F (36.3 C)-98.7 F (37.1 C)] 97.3 F (36.3 C) (03/18 0449) Pulse Rate:  [56-70] 56 (03/18 0449) Resp:  [18-25] 18 (03/18 0449) BP: (127-141)/(64-79) 141/74 mmHg (03/18 0449) SpO2:  [93 %-99 %] 99 % (03/18 0449) Weight:  [115.7 kg (255 lb 1.2 oz)] 115.7 kg (255 lb 1.2 oz) (03/17 2030) Weight change: 1 kg (2 lb 3.3 oz) Last BM Date: 12/25/12  Intake/Output from previous day: 03/17 0701 - 03/18 0700 In: 1770 [P.O.:820; I.V.:950] Out: 2453 [Urine:2451; Stool:2]     Physical Exam: General: Comfortable, alert, communicative, fully oriented, not short of breath at rest. Looks anasarcic. HEENT:  Mild clinical pallor, no jaundice, no conjunctival injection or discharge. NECK:  Supple, JVP not seen, no carotid bruits, no palpable lymphadenopathy, no palpable goiter. CHEST:  Clinically clear to auscultation, no wheezes, no crackles. HEART:  Sounds 1 and 2 heard, normal, regular, no murmurs. ABDOMEN:  Obese, soft, non-tender, no palpable organomegaly, no palpable masses, normal bowel sounds. GENITALIA:  Not examined. UPPER EXTREMITIES: Marked edema.  LOWER EXTREMITIES:  Marked pitting edema, palpable peripheral pulses. MUSCULOSKELETAL SYSTEM:  Unremarkable.  CENTRAL NERVOUS SYSTEM:  No focal neurologic  deficit on gross examination.  Lab Results:  Recent Labs  12/24/12 0900 12/26/12 0500  WBC 16.5* 12.1*  HGB 10.8* 9.2*  HCT 31.0* 26.5*  PLT 62* 114*    Recent Labs  12/25/12 0500 12/26/12 0500  NA 147* 146*  K 3.2* 3.7  CL 114* 114*  CO2 25 26  GLUCOSE 114* 105*  BUN 25* 18  CREATININE 0.97 0.90  CALCIUM 8.0* 7.8*   Recent Results (from the past 240 hour(s))  URINE CULTURE     Status: None   Collection Time    12/19/12  8:52 PM      Result Value Range Status   Specimen Description URINE, CLEAN CATCH   Final   Special Requests NONE   Final   Culture  Setup Time 12/20/2012 02:15   Final   Colony Count 8,000 COLONIES/ML   Final   Culture INSIGNIFICANT GROWTH   Final   Report Status 12/20/2012 FINAL   Final  MRSA PCR SCREENING     Status: None   Collection Time    12/20/12 12:01 AM      Result Value Range Status   MRSA by PCR NEGATIVE  NEGATIVE Final   Comment:            The GeneXpert MRSA Assay (FDA     approved for NASAL specimens     only), is one component of a     comprehensive MRSA colonization     surveillance program. It is not     intended to diagnose MRSA     infection nor to guide or     monitor treatment for  MRSA infections.  CULTURE, BLOOD (ROUTINE X 2)     Status: None   Collection Time    12/20/12  7:00 AM      Result Value Range Status   Specimen Description BLOOD HAND LEFT   Final   Special Requests BOTTLES DRAWN AEROBIC AND ANAEROBIC 10CC   Final   Culture  Setup Time 12/20/2012 13:46   Final   Culture     Final   Value:        BLOOD CULTURE RECEIVED NO GROWTH TO DATE CULTURE WILL BE HELD FOR 5 DAYS BEFORE ISSUING A FINAL NEGATIVE REPORT   Report Status PENDING   Incomplete  CULTURE, BLOOD (ROUTINE X 2)     Status: None   Collection Time    12/20/12  7:10 AM      Result Value Range Status   Specimen Description BLOOD HAND LEFT   Final   Special Requests BOTTLES DRAWN AEROBIC AND ANAEROBIC 10CC   Final   Culture  Setup Time  12/20/2012 13:46   Final   Culture     Final   Value:        BLOOD CULTURE RECEIVED NO GROWTH TO DATE CULTURE WILL BE HELD FOR 5 DAYS BEFORE ISSUING A FINAL NEGATIVE REPORT   Report Status PENDING   Incomplete  CLOSTRIDIUM DIFFICILE BY PCR     Status: None   Collection Time    12/20/12  9:54 AM      Result Value Range Status   C difficile by pcr NEGATIVE  NEGATIVE Final  URINE CULTURE     Status: None   Collection Time    12/20/12 11:34 AM      Result Value Range Status   Specimen Description URINE, CATHETERIZED   Final   Special Requests NONE   Final   Culture  Setup Time 12/20/2012 13:03   Final   Colony Count 10,000 COLONIES/ML   Final   Culture PROTEUS MIRABILIS   Final   Report Status 12/23/2012 FINAL   Final   Organism ID, Bacteria PROTEUS MIRABILIS   Final  URINE CULTURE     Status: None   Collection Time    12/22/12  4:09 PM      Result Value Range Status   Specimen Description URINE, CATHETERIZED FROM RIGHT RENAL PELVIS   Final   Special Requests NONE   Final   Culture  Setup Time 12/21/2012 16:38   Final   Colony Count NO GROWTH   Final   Culture NO GROWTH   Final   Report Status 12/23/2012 FINAL   Final  URINE CULTURE     Status: None   Collection Time    12/23/12  1:40 PM      Result Value Range Status   Specimen Description URINE, RANDOM   Final   Special Requests NONE   Final   Culture  Setup Time 12/23/2012 21:33   Final   Colony Count NO GROWTH   Final   Culture NO GROWTH   Final   Report Status 12/25/2012 FINAL   Final     Studies/Results: No results found.  Medications: Scheduled Meds: . ciprofloxacin  400 mg Intravenous Q12H  . potassium chloride  40 mEq Oral BID  . predniSONE  20 mg Oral BID WC   Continuous Infusions: . sodium chloride 50 mL/hr (12/25/12 1607)   PRN Meds:.acetaminophen, ondansetron (ZOFRAN) IV, ondansetron, sodium chloride    LOS: 7 days   Jamison Soward,CHRISTOPHER  [facility]  Pager [phone]. If 8PM-8AM, please contact  night-coverage at [email or web address], password [facility] 12/26/2012, 7:42 AM  LOS: 7 days

## 2012-12-26 NOTE — Progress Notes (Signed)
  Subjective: R PCN placed 3/14 Doing well Up brushing teeth.  No c/o drain  Objective: Vital signs in last 24 hours: Temp:  [97.3 F (36.3 C)-98.7 F (37.1 C)] 97.3 F (36.3 C) (03/18 0449) Pulse Rate:  [56-70] 56 (03/18 0449) Resp:  [18-25] 18 (03/18 0449) BP: (127-141)/(64-79) 141/74 mmHg (03/18 0449) SpO2:  [93 %-99 %] 99 % (03/18 0449) Weight:  [255 lb 1.2 oz (115.7 kg)] 255 lb 1.2 oz (115.7 kg) (03/17 2030) Last BM Date: 12/25/12  Intake/Output from previous day: 03/17 0701 - 03/18 0700 In: 1770 [P.O.:820; I.V.:950] Out: 2453 [Urine:2451; Stool:2] Intake/Output this shift:    Physical Exam: Rt PCN intact, site clean, NT Output clear urine, 700cc recorded yesterday    Lab Results:   Recent Labs  12/24/12 0900 12/26/12 0500  WBC 16.5* 12.1*  HGB 10.8* 9.2*  HCT 31.0* 26.5*  PLT 62* 114*   BMET  Recent Labs  12/25/12 0500 12/26/12 0500  NA 147* 146*  K 3.2* 3.7  CL 114* 114*  CO2 25 26  GLUCOSE 114* 105*  BUN 25* 18  CREATININE 0.97 0.90  CALCIUM 8.0* 7.8*   PT/INR No results found for this basename: LABPROT, INR,  in the last 72 hours ABG No results found for this basename: PHART, PCO2, PO2, HCO3,  in the last 72 hours  Studies/Results: No results found.  Anti-infectives:   Assessment/Plan: s/p * No surgery found *   LOS: 7 days  R PCN in place Output great WBC almost normal, BUN/Cr now normal. Await further recs from Urology.  Brayton El 12/26/2012

## 2012-12-27 ENCOUNTER — Other Ambulatory Visit: Payer: Medicare Other

## 2012-12-27 DIAGNOSIS — I517 Cardiomegaly: Secondary | ICD-10-CM

## 2012-12-27 HISTORY — PX: TRANSTHORACIC ECHOCARDIOGRAM: SHX275

## 2012-12-27 LAB — COMPREHENSIVE METABOLIC PANEL
ALT: 24 U/L (ref 0–53)
AST: 17 U/L (ref 0–37)
Alkaline Phosphatase: 71 U/L (ref 39–117)
CO2: 28 mEq/L (ref 19–32)
Chloride: 108 mEq/L (ref 96–112)
GFR calc non Af Amer: 86 mL/min — ABNORMAL LOW (ref >90)
Potassium: 3.5 mEq/L (ref 3.5–5.1)
Sodium: 143 mEq/L (ref 135–145)
Total Bilirubin: 0.3 mg/dL (ref 0.3–1.2)

## 2012-12-27 LAB — CBC
Hemoglobin: 9.6 g/dL — ABNORMAL LOW (ref 13.0–17.0)
Platelets: 182 10*3/uL (ref 150–400)
RBC: 3.57 MIL/uL — ABNORMAL LOW (ref 4.22–5.81)
WBC: 13.4 10*3/uL — ABNORMAL HIGH (ref 4.0–10.5)

## 2012-12-27 MED ORDER — LOPERAMIDE HCL 2 MG PO CAPS: 2.0000 mg | ORAL_CAPSULE | Freq: Four times a day (QID) | ORAL | Status: DC | PRN

## 2012-12-27 MED ORDER — CIPROFLOXACIN HCL 500 MG PO TABS: 500.0000 mg | ORAL_TABLET | Freq: Two times a day (BID) | ORAL | Status: DC

## 2012-12-27 MED ORDER — PREDNISONE 10 MG PO TABS: ORAL_TABLET | ORAL | Status: DC

## 2012-12-27 MED ORDER — ABIRATERONE ACETATE 250 MG PO TABS: 1000.0000 mg | ORAL_TABLET | Freq: Every day | ORAL | Status: DC

## 2012-12-27 NOTE — Progress Notes (Signed)
  Echocardiogram 2D Echocardiogram has been performed.  [name] 12/27/2012, 8:31 AM

## 2012-12-27 NOTE — Progress Notes (Signed)
Discharge teaching completed including sepsis, nephrostomy tube and care and avs.  Patient and wife demonstrated proper care of the nephrostomy tube and via teach back method of when to call the md.

## 2012-12-27 NOTE — Care Management Note (Addendum)
Met with pt who selected [facility] for HHRN to eval and assess nephrostomy tube. [facility] notified and will begin services post d/c.  Johny Shock RN MPH, case manager, [phone]    CARE MANAGEMENT NOTE 12/28/2012  Patient:  [name], [name]   Account Number:  [id number]  Date Initiated:  12/20/2012  Documentation initiated by:  [facility]  Subjective/Objective Assessment:   Sepsis - tx to ICU - on pressors.  Order for [facility] for Nephrostomy tube maintainance.     Action/Plan:   3/19 Met with pt who selected [facility] for HHRN.   Anticipated DC Date:  12/27/2012   Anticipated DC Plan:  HOME W HOME HEALTH SERVICES      DC Planning Services  CM consult      Choice offered to / List presented to:          [facility] arranged  HH-1 RN      [facility] agency  [facility]   Status of service:  Completed, signed off Medicare Important Message given?   (If response is "NO", the following Medicare IM given date fields will be blank) Date Medicare IM given:   Date Additional Medicare IM given:    Discharge Disposition:  HOME W HOME HEALTH SERVICES  Per UR Regulation:  Reviewed for med. necessity/level of care/duration of stay  If discussed at Long Length of Stay Meetings, dates discussed:   12/26/2012    Comments:  ContactFayrene Helper Spouse [phone]  12/25/12 0905 Henrietta Mayo RN BSN MSN CCM Xferred to [id number].  On ventimask, IVF @ 100 cc/hr, IV antibx. Urology following - ?internalizing nephrostomy tube.

## 2012-12-27 NOTE — Progress Notes (Signed)
[name] seems to be doing well. I think is fine to let him go home with a percutaneous tube. Consideration can be given, at a later date, to internalization of his right renal drainage with a stent. I would like to see him back in about 3 weeks to discuss this.

## 2012-12-27 NOTE — Discharge Summary (Signed)
Physician Discharge Summary  [name] YNW:[id number] DOB: 07-01-1946 DOA: 12/19/2012  PCP: Thora Lance, MD  Admit date: 12/19/2012 Discharge date: 12/27/2012  Time spent: 40 minutes  Recommendations for Outpatient Follow-up:  1. Patient is set up with home health services to help manage percutaneous nephrostomy. 2. Followup with urology in 3 weeks 3. Followup with primary care physician in 2 weeks.  Discharge Diagnoses:  Principal Problem:   Severe sepsis with septic shock Active Problems:   Prostate cancer   Acute renal failure   UTI (lower urinary tract infection)   Dehydration   Diarrhea   Hypertension   Sleep apnea   Thrombocytopenia, unspecified   Discharge Condition: Improved  Diet recommendation: Low salt  Filed Weights   12/25/12 0500 12/25/12 2030 12/26/12 2144  Weight: 114.9 kg (253 lb 4.9 oz) 115.7 kg (255 lb 1.2 oz) 110.088 kg (242 lb 11.2 oz)    History of present illness:  67 yo male with h/o prostate cancer, htn has been having 5 days of diarrhea nonbloody and dysuria. Went to see his pcp today, ua was cked and dx with uti given script for cipro and some lab work was done. Later he was called by pcp to come to ED for acute renal failure. Over the last month his cr went from normal and is now over 4 with mildly low k. He again has been having diarrhea and uti symptoms for 5 days. No n/v. No abd pain. No fevers. No hematuria. His bp was borderline low sbp 90 on arrival, he is receiving his second liter ivf and sbp is some improved now.   Hospital Course:  1. Severe sepsis/septic shock: Patient presented with fever, chills, wcc of 18.4 and positive urinary sediment, consistent with UTI. Septic work up was carried and empiricbroad-spectrum antibiotic coverage commenced, for acute pyelonephritis. He subsequently went int septic shock, and was transferred to [facility] service on 12/20/12. PCCM consultation was provided by Dr Koren Bound. Patient was initially  managed with IV Zosyn, but as urine cultures grew pan-sensitive Proteus Mirabilis, he was transitioned to monotherapy with ciprofloxacin, effective 12/24/12. Shock was managed with iv fluids and pressors, and clinical response was satisfactory. Patient was returned to [facility] on 12/24/12. Aiming a total of 14 days antibiotic treatment, to be concluded on 01/01/13. Sepsis is now resolved. Patient is apyrexial, and wcc has trended down nicely.  2. Proteus Mirabilis pyelonephritis: This is the etiology for patient's sepsis. See above discussion.  3. Dehydration/Acute renal failure w/R distal ureteral obstruction: On presentation, patient was in ARF, with creatinine 4.90, BUN 61, against a known baseline creatinine of 0.9-1.2 in 1-11/2012. This was multifactorial, secondary to sepsis/volume depletion and obstructive uropathy and right hydronephrosis, suspected due retroperitoneal adenopathy Managed as described above. Dr Marcine Matar provided urology consultation, and Dr Lowella Dandy, IR, placed a percutaneous nephrostomy on 12/22/12. ARF has resolved, and creatinine is 0.90 today. Urology mentioned possibility of internalizing nephrostomy tube at a later date. Patient will followup with urology in 3 weeks. 4. 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 He will continue follow up, on discharge.  5. Chronic steroid use: Pre-admission, patient was on Prednisone, 5 mg daily. During his hospitalization, he was managed with stress doses of steroids. Now tapering down to pre-admission maintenance dose.  6. Hypokalemia: Repleting as indicated. Mg has been confirmed to be normal. Following  lytes.  7. Diarrhea: Patient has had diarrhea for almost 2 weeks, and this was present on admission. Likely viral in etiology, perpetuated by antibiotics. Fortunately, C. Difficle  PCR is negative. Will manage symptomatically with Loperamide.  8. Hx of Hypertension: Norvasc and Hyzaar were placed on hold, due to hypotension. BP is now creeping up, so these will be restarted on discharge.  9. Sleep apnea: Stable on nocturnal CPAP.  10. Thrombocytopenia, unspecified: Likely secondary to sepsis. Heparin was discontinued on 12/21/12 and Lovenox was given on 12/22/12 only. Resolved.  11. Anasarca: Patient looked markedly anasacic and volume overloaded on physical exam on 01/26/13. This is likely due to aggressive iv fluid resuscitation. Serum albumin is low. His hypoalbuminemia is likely due to his acute illness. He has been given Lasix with good effect. He's not short of breath. 2-D echocardiogram shows normal ejection fraction without any wall motion abnormalities. His urine output is good, and anasarca should hopefully resolve with normal renal function.  Procedures: Percutaneous right nephrostomy tube placement 12/22/12   Consultations: Dr Koren Bound, PCCM.  Dr Marcine Matar, urologist.  Dr Richarda Overlie, IR.    Discharge Exam: Filed Vitals:   12/26/12 2118 12/26/12 2144 12/27/12 0447 12/27/12 0848  BP:  144/78 142/63 140/68  Pulse:  54 58 64  Temp:  97.1 F (36.2 C) 97.7 F (36.5 C) 97.8 F (36.6 C)  TempSrc:  Oral Oral Oral  Resp: 18 18 18 18   Height:      Weight:  110.088 kg (242 lb 11.2 oz)    SpO2:  100% 100% 100%    General: No acute distress Cardiovascular: S1, S2, regular rate and rhythm Respiratory: Clear to auscultation bilaterally  Discharge Instructions  Discharge Orders   Future Appointments Provider Department Dept Phone   01/02/2013 10:30 AM Benjiman Core, MD [facility] [phone]   Future Orders Complete By Expires     Call MD for:  persistant nausea and vomiting  As directed     Call MD for:  severe uncontrolled pain  As directed     Call MD for:  temperature >100.4  As directed     Diet - low sodium  heart healthy  As directed     Increase activity slowly  As directed         Medication List    TAKE these medications       abiraterone Acetate 250 MG tablet  Commonly known as:  ZYTIGA  Take 4 tablets (1,000 mg total) by mouth daily. Take on an empty stomach 1 hour before or 2 hours after a meal, start only after seen by Dr. Hedda Slade ANTACID PO  Take 2 tablets by mouth 2 (two) times daily as needed (for upset stomach).     amLODipine 10 MG tablet  Commonly known as:  NORVASC  Take 10 mg by mouth daily.     aspirin 81 MG tablet  Take 81 mg by mouth daily.     calcium carbonate 1250 MG chewable tablet  Commonly known as:  OS-CAL  Chew 2 tablets by mouth daily.     ciprofloxacin 500 MG tablet  Commonly known as:  CIPRO  Take 1 tablet (500 mg total) by mouth 2 (two) times daily. Started 12/19/12, for 10 days, ending 12/28/12.     leuprolide 30 MG injection  Commonly known as:  LUPRON  Inject 30 mg into the muscle every 4 (four) months.  loperamide 2 MG capsule  Commonly known as:  IMODIUM  Take 1 capsule (2 mg total) by mouth 4 (four) times daily as needed for diarrhea or loose stools.     losartan-hydrochlorothiazide 100-12.5 MG per tablet  Commonly known as:  HYZAAR  Take 1 tablet by mouth daily.     predniSONE 10 MG tablet  Commonly known as:  DELTASONE  Take 30mg  po daily for 2 days, then 20mg  po daily for 2 days then 10mg  po daily for 2 days then 5mg  daily           Follow-up Information   Follow up with Chelsea Aus, MD. (3 weeks)    Contact information:   [address] [place] [address] [phone]       Follow up with Thora Lance, MD. Schedule an appointment as soon as possible for a visit in 2 weeks.   Contact information:   [address] [place] [address] [phone]        The results of significant diagnostics from this hospitalization (including imaging, microbiology,  ancillary and laboratory) are listed below for reference.    Significant Diagnostic Studies: US Renal Port  12/20/2012  *RADIOLOGY REPORT*  Clinical Data: Acute renal failure.  Decreased urine output.  RENAL/URINARY TRACT ULTRASOUND COMPLETE  Comparison:  CT abdomen pelvis 10/24/2012.  Findings:  Right Kidney:  Moderate right-sided hydronephrosis is present.  The obstructing lesion is not evident on this exam.  The right renal parenchyma is preserved.  It is of normal size and echotexture. The maximal length is 12.7 cm, within normal limits.  Left Kidney:  Isoechoic lesion at the upper pole of the right kidney measures 1.7 x 2.3 x 2.1 cm. Previous CT scan revealed a simple cyst at this location.  There is no shadowing on today's study.  The other renal cysts are not demonstrated.  The parenchyma is within normal limits.  There is no hydronephrosis.  No stones are evident.  Bladder:  A Foley catheter is present within the urinary bladder.  IMPRESSION:  1.  Moderate right-sided hydronephrosis. 2.  Isoechoic lesion of the left kidney may represent a hemorrhagic cyst.  A simple cyst was evident in this location on prior exams.   Original Report Authenticated By: Marin Roberts, [name]    Ir Nephrostogram Right  12/22/2012  *RADIOLOGY REPORT*  Clinical history:67 year old with history of prostate cancer, sepsis and right hydronephrosis.  PROCEDURE(S): ULTRASOUND AND FLUOROSCOPIC GUIDED PLACEMENT OF A RIGHT PERCUTANEOUS NEPHROSTOMY TUBE; RIGHT NEPHROSTOGRAM  Physician: Rachelle Hora. Henn, MD  Medications:Versed 2 mg, Fentanyl 150 mcg. A radiology nurse monitored the patient for moderate sedation.  Moderate sedation time:30 minutes  Fluoroscopy time: 4.7 minutes  Procedure:Informed consent was obtained for a nephrostomy tube placement.  The patient was placed prone.  The right flank was prepped and draped in a sterile fashion.  Maximal barrier sterile technique was utilized including caps, mask, sterile gowns, sterile  gloves, sterile drape, hand hygiene and skin antiseptic.  The skin was anesthetized with 1% lidocaine.  Using ultrasound guidance, a 21 gauge needle was directed into the lower pole calix.  A small amount of cloudy yellow urine was identified.  A small amount of contrast was used to opacify the renal collecting system and a wire was advanced into the renal pelvis.  An Accustick dilator set was placed.  The Bentson wire was advanced into the ureter.  The tract was dilated and a 10-French multipurpose drain was placed.  The patient has a very small renal pelvis.  As a result, the multipurpose drain was exchanged for a [employer] 10-French drain.  The pigtail was reconstituted in the renal pelvis.  Small amount of contrast was injected to confirm placement in the renal pelvis.  Due to the small size of the collecting system, there was also opacification of the right ureter.  Catheter was sutured to the skin and attached to a gravity bag.  Findings:Mild right hydronephrosis.  There appears to be an obstruction of the distal right ureter, probably near the ureterovesical junction.  Decompression of the collecting system following catheter placement.  Only a small amount of cloudy urine was aspirated and this was sent for Gram stain and culture.  Complications: None  Impression:Successful placement of a percutaneous right nephrostomy tube with image guidance.  Obstruction at the distal right ureter.   Original Report Authenticated By: Richarda Overlie, [name]    Ir Perc Nephrostomy Right  12/22/2012  *RADIOLOGY REPORT*  Clinical history:67 year old with history of prostate cancer, sepsis and right hydronephrosis.  PROCEDURE(S): ULTRASOUND AND FLUOROSCOPIC GUIDED PLACEMENT OF A RIGHT PERCUTANEOUS NEPHROSTOMY TUBE; RIGHT NEPHROSTOGRAM  Physician: Rachelle Hora. Henn, MD  Medications:Versed 2 mg, Fentanyl 150 mcg. A radiology nurse monitored the patient for moderate sedation.  Moderate sedation time:30 minutes  Fluoroscopy time: 4.7  minutes  Procedure:Informed consent was obtained for a nephrostomy tube placement.  The patient was placed prone.  The right flank was prepped and draped in a sterile fashion.  Maximal barrier sterile technique was utilized including caps, mask, sterile gowns, sterile gloves, sterile drape, hand hygiene and skin antiseptic.  The skin was anesthetized with 1% lidocaine.  Using ultrasound guidance, a 21 gauge needle was directed into the lower pole calix.  A small amount of cloudy yellow urine was identified.  A small amount of contrast was used to opacify the renal collecting system and a wire was advanced into the renal pelvis.  An Accustick dilator set was placed.  The Bentson wire was advanced into the ureter.  The tract was dilated and a 10-French multipurpose drain was placed.  The patient has a very small renal pelvis.  As a result, the multipurpose drain was exchanged for a [employer] 10-French drain.  The pigtail was reconstituted in the renal pelvis.  Small amount of contrast was injected to confirm placement in the renal pelvis.  Due to the small size of the collecting system, there was also opacification of the right ureter.  Catheter was sutured to the skin and attached to a gravity bag.  Findings:Mild right hydronephrosis.  There appears to be an obstruction of the distal right ureter, probably near the ureterovesical junction.  Decompression of the collecting system following catheter placement.  Only a small amount of cloudy urine was aspirated and this was sent for Gram stain and culture.  Complications: None  Impression:Successful placement of a percutaneous right nephrostomy tube with image guidance.  Obstruction at the distal right ureter.   Original Report Authenticated By: Richarda Overlie, [name]    Ir US Guidance  12/22/2012  *RADIOLOGY REPORT*  Clinical history:67 year old with history of prostate cancer, sepsis and right hydronephrosis.  PROCEDURE(S): ULTRASOUND AND FLUOROSCOPIC GUIDED  PLACEMENT OF A RIGHT PERCUTANEOUS NEPHROSTOMY TUBE; RIGHT NEPHROSTOGRAM  Physician: Rachelle Hora. Henn, MD  Medications:Versed 2 mg, Fentanyl 150 mcg. A radiology nurse monitored the patient for moderate sedation.  Moderate sedation time:30 minutes  Fluoroscopy time: 4.7 minutes  Procedure:Informed consent was obtained for a nephrostomy tube  placement.  The patient was placed prone.  The right flank was prepped and draped in a sterile fashion.  Maximal barrier sterile technique was utilized including caps, mask, sterile gowns, sterile gloves, sterile drape, hand hygiene and skin antiseptic.  The skin was anesthetized with 1% lidocaine.  Using ultrasound guidance, a 21 gauge needle was directed into the lower pole calix.  A small amount of cloudy yellow urine was identified.  A small amount of contrast was used to opacify the renal collecting system and a wire was advanced into the renal pelvis.  An Accustick dilator set was placed.  The Bentson wire was advanced into the ureter.  The tract was dilated and a 10-French multipurpose drain was placed.  The patient has a very small renal pelvis.  As a result, the multipurpose drain was exchanged for a [employer] 10-French drain.  The pigtail was reconstituted in the renal pelvis.  Small amount of contrast was injected to confirm placement in the renal pelvis.  Due to the small size of the collecting system, there was also opacification of the right ureter.  Catheter was sutured to the skin and attached to a gravity bag.  Findings:Mild right hydronephrosis.  There appears to be an obstruction of the distal right ureter, probably near the ureterovesical junction.  Decompression of the collecting system following catheter placement.  Only a small amount of cloudy urine was aspirated and this was sent for Gram stain and culture.  Complications: None  Impression:Successful placement of a percutaneous right nephrostomy tube with image guidance.  Obstruction at the distal right  ureter.   Original Report Authenticated By: Richarda Overlie, [name]    Dg Chest Port 1 View  12/20/2012  *RADIOLOGY REPORT*  Clinical Data: Central line placement  PORTABLE CHEST - 1 VIEW  Comparison: Chest radiograph 03/12 1014  Findings: Interval place right central venous line with tip in distal SVC.  No pneumothorax.  Stable enlarged heart silhouette. There is central venous pulmonary congestion unchanged.  IMPRESSION: Interval placement of right central venous line without pneumothorax.   Original Report Authenticated By: Genevive Bi, [name]    Dg Chest Port 1 View  12/20/2012  *RADIOLOGY REPORT*  Clinical Data: Hypoxia.  Shortness of breath.  Slight mid chest pain.  PORTABLE CHEST - 1 VIEW  Comparison: 09/07/2008  Findings: There is mild chronic cardiomegaly with chronic or recurrent pulmonary vascular prominence.  No infiltrates or effusions.  No acute osseous abnormality.  IMPRESSION: Chronic cardiomegaly with chronic or recurrent pulmonary vascular prominence.   Original Report Authenticated By: Francene Boyers, [name]     Microbiology: Recent Results (from the past 240 hour(s))  URINE CULTURE     Status: None   Collection Time    12/19/12  8:52 PM      Result Value Range Status   Specimen Description URINE, CLEAN CATCH   Final   Special Requests NONE   Final   Culture  Setup Time 12/20/2012 02:15   Final   Colony Count 8,000 COLONIES/ML   Final   Culture INSIGNIFICANT GROWTH   Final   Report Status 12/20/2012 FINAL   Final  MRSA PCR SCREENING     Status: None   Collection Time    12/20/12 12:01 AM      Result Value Range Status   MRSA by PCR NEGATIVE  NEGATIVE Final   Comment:            The GeneXpert MRSA Assay (FDA     approved for  NASAL specimens     only), is one component of a     comprehensive MRSA colonization     surveillance program. It is not     intended to diagnose MRSA     infection nor to guide or     monitor treatment for     MRSA infections.  CULTURE, BLOOD  (ROUTINE X 2)     Status: None   Collection Time    12/20/12  7:00 AM      Result Value Range Status   Specimen Description BLOOD HAND LEFT   Final   Special Requests BOTTLES DRAWN AEROBIC AND ANAEROBIC 10CC   Final   Culture  Setup Time 12/20/2012 13:46   Final   Culture NO GROWTH 5 DAYS   Final   Report Status 12/26/2012 FINAL   Final  CULTURE, BLOOD (ROUTINE X 2)     Status: None   Collection Time    12/20/12  7:10 AM      Result Value Range Status   Specimen Description BLOOD HAND LEFT   Final   Special Requests BOTTLES DRAWN AEROBIC AND ANAEROBIC 10CC   Final   Culture  Setup Time 12/20/2012 13:46   Final   Culture NO GROWTH 5 DAYS   Final   Report Status 12/26/2012 FINAL   Final  CLOSTRIDIUM DIFFICILE BY PCR     Status: None   Collection Time    12/20/12  9:54 AM      Result Value Range Status   C difficile by pcr NEGATIVE  NEGATIVE Final  URINE CULTURE     Status: None   Collection Time    12/20/12 11:34 AM      Result Value Range Status   Specimen Description URINE, CATHETERIZED   Final   Special Requests NONE   Final   Culture  Setup Time 12/20/2012 13:03   Final   Colony Count 10,000 COLONIES/ML   Final   Culture PROTEUS MIRABILIS   Final   Report Status 12/23/2012 FINAL   Final   Organism ID, Bacteria PROTEUS MIRABILIS   Final  URINE CULTURE     Status: None   Collection Time    12/22/12  4:09 PM      Result Value Range Status   Specimen Description URINE, CATHETERIZED FROM RIGHT RENAL PELVIS   Final   Special Requests NONE   Final   Culture  Setup Time 12/21/2012 16:38   Final   Colony Count NO GROWTH   Final   Culture NO GROWTH   Final   Report Status 12/23/2012 FINAL   Final  URINE CULTURE     Status: None   Collection Time    12/23/12  1:40 PM      Result Value Range Status   Specimen Description URINE, RANDOM   Final   Special Requests NONE   Final   Culture  Setup Time 12/23/2012 21:33   Final   Colony Count NO GROWTH   Final   Culture NO GROWTH    Final   Report Status 12/25/2012 FINAL   Final     Labs: Basic Metabolic Panel:  Recent Labs Lab 12/21/12 1800 12/22/12 0530 12/23/12 0443 12/24/12 0428 12/25/12 0500 12/26/12 0500 12/27/12 0415  NA 143 146* 146* 145 147* 146* 143  K 3.5 3.1* 3.0* 3.3* 3.2* 3.7 3.5  CL 110 112 114* 111 114* 114* 108  CO2 20 23 22 22 25 26 28   GLUCOSE 231* 166* 131*  146* 114* 105* 112*  BUN 56* 55* 47* 37* 25* 18 17  CREATININE 2.11* 1.84* 1.44* 1.27 0.97 0.90 0.91  CALCIUM 8.2* 8.1* 7.8* 7.9* 8.0* 7.8* 7.9*  MG  --  2.3 2.1  --   --   --   --   PHOS  --  3.1 3.1  --   --   --   --    Liver Function Tests:  Recent Labs Lab 12/26/12 1426 12/27/12 0415  AST  --  17  ALT  --  24  ALKPHOS  --  71  BILITOT  --  0.3  PROT  --  5.7*  ALBUMIN 2.4* 2.3*   No results found for this basename: LIPASE, AMYLASE,  in the last 168 hours No results found for this basename: AMMONIA,  in the last 168 hours CBC:  Recent Labs Lab 12/23/12 1140 12/23/12 1755 12/24/12 0900 12/26/12 0500 12/27/12 0415  WBC 22.7* 20.1* 16.5* 12.1* 13.4*  HGB 10.9* 10.7* 10.8* 9.2* 9.6*  HCT 30.6* 30.2* 31.0* 26.5* 27.8*  MCV 76.1* 75.7* 76.2* 76.4* 77.9*  PLT 42* 53* 62* 114* 182   Cardiac Enzymes: No results found for this basename: CKTOTAL, CKMB, CKMBINDEX, TROPONINI,  in the last 168 hours BNP: BNP (last 3 results) No results found for this basename: PROBNP,  in the last 8760 hours CBG:  Recent Labs Lab 12/21/12 0856  GLUCAP 149*       Signed:  Raeanna Soberanes  [facility] 12/27/2012, 2:35 PM

## 2013-01-01 ENCOUNTER — Other Ambulatory Visit (HOSPITAL_BASED_OUTPATIENT_CLINIC_OR_DEPARTMENT_OTHER): Payer: Medicare Other | Admitting: Lab

## 2013-01-01 DIAGNOSIS — C61 Malignant neoplasm of prostate: Secondary | ICD-10-CM

## 2013-01-01 LAB — COMPREHENSIVE METABOLIC PANEL (CC13)
ALT: 22 U/L (ref 0–55)
BUN: 11.7 mg/dL (ref 7.0–26.0)
CO2: 29 mEq/L (ref 22–29)
Calcium: 8.6 mg/dL (ref 8.4–10.4)
Creatinine: 0.9 mg/dL (ref 0.7–1.3)
Glucose: 161 mg/dl — ABNORMAL HIGH (ref 70–99)
Total Bilirubin: 0.33 mg/dL (ref 0.20–1.20)

## 2013-01-01 LAB — CBC WITH DIFFERENTIAL/PLATELET
BASO%: 0.2 % (ref 0.0–2.0)
Basophils Absolute: 0 10*3/uL (ref 0.0–0.1)
HCT: 31.2 % — ABNORMAL LOW (ref 38.4–49.9)
HGB: 10 g/dL — ABNORMAL LOW (ref 13.0–17.1)
LYMPH%: 13.2 % — ABNORMAL LOW (ref 14.0–49.0)
MCHC: 32.2 g/dL (ref 32.0–36.0)
MONO#: 0.6 10*3/uL (ref 0.1–0.9)
NEUT%: 80.3 % — ABNORMAL HIGH (ref 39.0–75.0)
Platelets: 371 10*3/uL (ref 140–400)
WBC: 10.8 10*3/uL — ABNORMAL HIGH (ref 4.0–10.3)

## 2013-01-02 ENCOUNTER — Other Ambulatory Visit: Payer: Medicare Other | Admitting: Lab

## 2013-01-02 ENCOUNTER — Ambulatory Visit (HOSPITAL_BASED_OUTPATIENT_CLINIC_OR_DEPARTMENT_OTHER): Payer: Medicare Other | Admitting: Oncology

## 2013-01-02 ENCOUNTER — Telehealth: Payer: Self-pay | Admitting: Oncology

## 2013-01-02 VITALS — BP 112/64 | HR 74 | Temp 97.7°F | Resp 20 | Ht 68.0 in | Wt 239.2 lb

## 2013-01-02 DIAGNOSIS — C61 Malignant neoplasm of prostate: Secondary | ICD-10-CM

## 2013-01-02 LAB — PSA: PSA: 352.1 ng/mL — ABNORMAL HIGH (ref <=4.00)

## 2013-01-02 NOTE — Telephone Encounter (Signed)
Gave pt appt for MRI, lab , ML and chemo for April and May 2014

## 2013-01-02 NOTE — Progress Notes (Signed)
Hematology and Oncology Follow Up Visit  [name] [id number] Mar 22, 1946 67 y.o. 01/02/2013 10:24 AM   [name], [name]  [name]. [name], [name]  Principle Diagnosis:This is a 67 year old gentleman with prostate cancer initially diagnosed in 2001.  He had a Gleason score of 3 + 3 = 6.  He has castration-resistant disease with pelvic adenopathy.    Prior Therapy:  1. Status post prostatectomy done in May 2001.  He had T3 N1 disease.  PSA nadir to 0.  2. The patient developed biochemical relapse and received prostatic bed radiation.  3. The patient received Lupron with Casodex due to rising PSA.  The patient subsequently developed castration-resistant disease. 4. Patient treated with Casodex withdrawal and subsequently PSA rose to 18. 5. Patient with Provenge immunotherapy completed in July 2011. 6.  He received ketoconazole and prednisone December 2011 through January 2014.  Current therapy: 1. He started Zytiga 1000 mg daily with Prednisone 5 mg daily on 11/03/12.  2. He is on Lupron 30 mg every 4 months. He is due for an injection in 01/2013.  Interim History: [name] presents today for a followup visit. He started on Zytiga in 1/2014without any significant toxicity. He did have a hospitalization between 3/11 and 3/19 after he presented with sepsis and acute renal failure. He did require a right nephrostomy tube for hydronephrosis. He was discharged after full recovery of his renal function and has been doing well. He is not reporting any nausea.  He is not reporting any vomiting.  Had not reported any abdominal pain or back pain.  He had not had any other GI toxicities at this time.  Overall, his performance status and activity levels are close to base line.  He continues to perform activities of daily living without any hindrance or decline.  He had not reported any back pain.  Had not reported any genitourinary complaints.     Medications: I have reviewed the patient's  current medications. Current outpatient prescriptions:abiraterone Acetate (ZYTIGA) 250 MG tablet, Take 4 tablets (1,000 mg total) by mouth daily. Take on an empty stomach 1 hour before or 2 hours after a meal, start only after seen by Dr. Clelia Croft, Disp: 120 tablet, Rfl: 1;  amLODipine (NORVASC) 10 MG tablet, Take 10 mg by mouth daily.  , Disp: , Rfl: ;  aspirin 81 MG tablet, Take 81 mg by mouth daily.  , Disp: , Rfl:  calcium carbonate (OS-CAL) 1250 MG chewable tablet, Chew 2 tablets by mouth daily., Disp: , Rfl: ;  Calcium Carbonate Antacid (ALKA-SELTZER ANTACID PO), Take 2 tablets by mouth 2 (two) times daily as needed (for upset stomach)., Disp: , Rfl: ;  ciprofloxacin (CIPRO) 500 MG tablet, Take 1 tablet (500 mg total) by mouth 2 (two) times daily. Started 12/19/12, for 10 days, ending 12/28/12., Disp: 10 tablet, Rfl: 0 leuprolide (LUPRON) 30 MG injection, Inject 30 mg into the muscle every 4 (four) months.  , Disp: , Rfl: ;  loperamide (IMODIUM) 2 MG capsule, Take 1 capsule (2 mg total) by mouth 4 (four) times daily as needed for diarrhea or loose stools., Disp: 30 capsule, Rfl: 0;  losartan-hydrochlorothiazide (HYZAAR) 100-12.5 MG per tablet, Take 1 tablet by mouth daily.  , Disp: , Rfl:  predniSONE (DELTASONE) 10 MG tablet, Take 30mg  po daily for 2 days, then 20mg  po daily for 2 days then 10mg  po daily for 2 days then 5mg  daily, Disp: 30 tablet, Rfl: 0  Allergies: No Known Allergies  Past  Medical History, Surgical history, Social history, and Family History were reviewed and updated.  Review of Systems: Constitutional:  Negative for fever, chills, night sweats, anorexia, weight loss, pain. Cardiovascular: no chest pain or dyspnea on exertion Respiratory: no cough, shortness of breath, or wheezing Neurological: no TIA or stroke symptoms Dermatological: negative ENT: negative Skin: Negative Gastrointestinal: no abdominal pain, change in bowel habits, or black or bloody stools Genito-Urinary: no  dysuria, trouble voiding, or hematuria Hematological and Lymphatic: negative Musculoskeletal: negative Remaining ROS negative.  Physical Exam: Blood pressure 112/64, pulse 74, temperature 97.7 F (36.5 C), temperature source Oral, resp. rate 20, height 5\' 8"  (1.727 m), weight 239 lb 3.2 oz (108.5 kg). ECOG: 1 General appearance: alert Head: Normocephalic, without obvious abnormality, atraumatic Neck: no adenopathy, no carotid bruit, no JVD, supple, symmetrical, trachea midline and thyroid not enlarged, symmetric, no tenderness/mass/nodules Lymph nodes: Cervical, supraclavicular, and axillary nodes normal. Heart:regular rate and rhythm, S1, S2 normal, no murmur, click, rub or gallop Lung:chest clear, no wheezing, rales, normal symmetric air entry, Heart exam - S1, S2 normal, no murmur, no gallop, rate regular Abdomen: soft, non-tender, without masses or organomegaly EXT:1+ edema noted.  Lab Results: Lab Results  Component Value Date   WBC 10.8* 01/01/2013   HGB 10.0* 01/01/2013   HCT 31.2* 01/01/2013   MCV 82.3 01/01/2013   PLT 371 01/01/2013     Chemistry      Component Value Date/Time   NA 144 01/01/2013 1432   NA 143 12/27/2012 0415   K 3.5 01/01/2013 1432   K 3.5 12/27/2012 0415   CL 104 01/01/2013 1432   CL 108 12/27/2012 0415   CO2 29 01/01/2013 1432   CO2 28 12/27/2012 0415   BUN 11.7 01/01/2013 1432   BUN 17 12/27/2012 0415   CREATININE 0.9 01/01/2013 1432   CREATININE 0.91 12/27/2012 0415      Component Value Date/Time   CALCIUM 8.6 01/01/2013 1432   CALCIUM 7.9* 12/27/2012 0415   ALKPHOS 78 01/01/2013 1432   ALKPHOS 71 12/27/2012 0415   AST 15 01/01/2013 1432   AST 17 12/27/2012 0415   ALT 22 01/01/2013 1432   ALT 24 12/27/2012 0415   BILITOT 0.33 01/01/2013 1432   BILITOT 0.3 12/27/2012 0415     Results for [name], [name] (MRN [id number]) as of 01/02/2013 10:28  Ref. Range 10/24/2012 08:08 11/30/2012 08:19 01/01/2013 14:32  PSA Latest Range: <=4.00 ng/mL 257.70 (H) 159.30 (H)  352.10 (H)     Impression and Plan:   This is a 67 year old gentleman with the following issues: 1. Castration-resistant prostate cancer with pelvic adenopathy. His PSA was up to 352 on ketoconazole. CT scan from 10/2012 showed stable disease, but he had rapid rise in his PSA. He was started on Zytiga with Prednisone in January 2014. With PSA down to 159 in 11/2012. He has been off Zytiga since early march (close to 3 weeks) and his PSA is now up again. I recommend restarting Zytiga again and if his PSA drops again we will continue with it. If his PSA continues to rise, chemotherapy will be the next step. 2. Actue renal failure due hydronephrosis: He is S/P right  nephrostomy tube on 3/14. Kidney function is back to normal.  3. Sepsis and thrombocytopenia: Unlikely related to [facility] or cancer. Resolved now.  4. Hormonal deprivation.  He continues to be on Lupron. Next dose due 01/2013 5. Hypertension. He is on Norvasc and Hyzaar per PCP.   6. Followup will  be in 4-5 weeks.     [name] 3/25/201410:24 AM

## 2013-01-16 ENCOUNTER — Other Ambulatory Visit: Payer: Self-pay | Admitting: Urology

## 2013-01-16 DIAGNOSIS — C61 Malignant neoplasm of prostate: Secondary | ICD-10-CM

## 2013-01-16 DIAGNOSIS — N1330 Unspecified hydronephrosis: Secondary | ICD-10-CM

## 2013-01-17 ENCOUNTER — Other Ambulatory Visit: Payer: Self-pay | Admitting: Radiology

## 2013-01-18 ENCOUNTER — Encounter (HOSPITAL_COMMUNITY): Payer: Self-pay | Admitting: Pharmacy Technician

## 2013-01-19 ENCOUNTER — Other Ambulatory Visit: Payer: Self-pay | Admitting: Urology

## 2013-01-19 ENCOUNTER — Ambulatory Visit (HOSPITAL_COMMUNITY)
Admission: RE | Admit: 2013-01-19 | Discharge: 2013-01-19 | Disposition: A | Discharge status: Home / Self Care | Payer: Medicare Other | Source: Ambulatory Visit | Attending: Urology | Admitting: Urology

## 2013-01-19 ENCOUNTER — Encounter (HOSPITAL_COMMUNITY): Payer: Self-pay

## 2013-01-19 DIAGNOSIS — Z86718 Personal history of other venous thrombosis and embolism: Secondary | ICD-10-CM | POA: Insufficient documentation

## 2013-01-19 DIAGNOSIS — C61 Malignant neoplasm of prostate: Secondary | ICD-10-CM

## 2013-01-19 DIAGNOSIS — M1990 Unspecified osteoarthritis, unspecified site: Secondary | ICD-10-CM | POA: Insufficient documentation

## 2013-01-19 DIAGNOSIS — G4730 Sleep apnea, unspecified: Secondary | ICD-10-CM | POA: Insufficient documentation

## 2013-01-19 DIAGNOSIS — Z79899 Other long term (current) drug therapy: Secondary | ICD-10-CM | POA: Insufficient documentation

## 2013-01-19 DIAGNOSIS — I10 Essential (primary) hypertension: Secondary | ICD-10-CM | POA: Insufficient documentation

## 2013-01-19 DIAGNOSIS — Z96659 Presence of unspecified artificial knee joint: Secondary | ICD-10-CM | POA: Insufficient documentation

## 2013-01-19 DIAGNOSIS — Z7982 Long term (current) use of aspirin: Secondary | ICD-10-CM | POA: Insufficient documentation

## 2013-01-19 DIAGNOSIS — N1330 Unspecified hydronephrosis: Secondary | ICD-10-CM

## 2013-01-19 DIAGNOSIS — N139 Obstructive and reflux uropathy, unspecified: Secondary | ICD-10-CM | POA: Insufficient documentation

## 2013-01-19 DIAGNOSIS — Z936 Other artificial openings of urinary tract status: Secondary | ICD-10-CM | POA: Insufficient documentation

## 2013-01-19 DIAGNOSIS — E669 Obesity, unspecified: Secondary | ICD-10-CM | POA: Insufficient documentation

## 2013-01-19 LAB — CBC WITH DIFFERENTIAL/PLATELET
HCT: 32.1 % — ABNORMAL LOW (ref 39.0–52.0)
Hemoglobin: 10.5 g/dL — ABNORMAL LOW (ref 13.0–17.0)
Lymphocytes Relative: 31 % (ref 12–46)
Lymphs Abs: 1.9 10*3/uL (ref 0.7–4.0)
Monocytes Absolute: 0.4 10*3/uL (ref 0.1–1.0)
Monocytes Relative: 6 % (ref 3–12)
Neutro Abs: 3.7 10*3/uL (ref 1.7–7.7)
Neutrophils Relative %: 58 % (ref 43–77)
RBC: 3.88 MIL/uL — ABNORMAL LOW (ref 4.22–5.81)
WBC: 6.4 10*3/uL (ref 4.0–10.5)

## 2013-01-19 LAB — PROTIME-INR: INR: 0.92 (ref 0.00–1.49)

## 2013-01-19 MED ORDER — FENTANYL CITRATE 0.05 MG/ML IJ SOLN
INTRAMUSCULAR | Status: AC
  Filled 2013-01-19: qty 8

## 2013-01-19 MED ORDER — CIPROFLOXACIN IN D5W 400 MG/200ML IV SOLN
400.0000 mg | Freq: Once | INTRAVENOUS | Status: AC
  Administered 2013-01-19: 400 mg via INTRAVENOUS
  Filled 2013-01-19: qty 200

## 2013-01-19 MED ORDER — SODIUM CHLORIDE 0.9 % IV SOLN
INTRAVENOUS | Status: DC
  Administered 2013-01-19: 20 mL via INTRAVENOUS

## 2013-01-19 MED ORDER — MIDAZOLAM HCL 2 MG/2ML IJ SOLN
INTRAMUSCULAR | Status: AC | PRN
  Administered 2013-01-19 (×2): 2 mg via INTRAVENOUS

## 2013-01-19 MED ORDER — FENTANYL CITRATE 0.05 MG/ML IJ SOLN
INTRAMUSCULAR | Status: AC | PRN
  Administered 2013-01-19: 100 ug via INTRAVENOUS

## 2013-01-19 MED ORDER — MIDAZOLAM HCL 2 MG/2ML IJ SOLN
INTRAMUSCULAR | Status: AC
  Filled 2013-01-19: qty 8

## 2013-01-19 MED ORDER — IOHEXOL 300 MG/ML  SOLN
50.0000 mL | Freq: Once | INTRAMUSCULAR | Status: AC | PRN
  Administered 2013-01-19: 25 mL

## 2013-01-19 NOTE — Procedures (Signed)
Successful conversion of right sided PCN to double J ureteral stent.  No immediate complications.

## 2013-01-19 NOTE — H&P (Signed)
Chief Complaint: "I'm here for a stent" Referring Physician:[name] HPI: [name] is an 67 y.o. male with hx of prostate cancer who developed obstructive uropathy and had to have a right percutaneous nephrostomy placed on 3/14. He is now scheduled for attempt at Lennon Alstrom' ureteral stent placement. PMHx and meds reviewed.  Past Medical History:  Past Medical History  Diagnosis Date  . Prostate cancer   . Hypertension   . Obesity   . Sleep apnea   . Osteoarthritis   . DVT (deep venous thrombosis) 08/2008  . 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 Prostatectomy  02/2000  . Joint replacement      bilateral knee replacement  . Hernia repair  2005    Family History: No family history on file.  Social History:  reports that he has never smoked. He does not have any smokeless tobacco history on file. He reports that  drinks alcohol. He reports that he does not use illicit drugs.  Allergies: No Known Allergies  Medications: abiraterone Acetate (ZYTIGA) 250 MG tablet (Taking) 120 tablet 1 12/27/2012 Sig - Route: Take 4 tablets (1,000 mg total) by mouth daily. Take on an empty stomach 1 hour before or 2 hours after a meal, start only after seen by Dr. Clelia Croft - Oral Class: Print amLODipine (NORVASC) 10 MG tablet (Taking) Sig - Route: Take 10 mg by mouth every morning. - Oral Class: Historical Med Number of times this order has been changed since signing: 4 Order [address] aspirin 81 MG tablet (Taking) Sig - Route: Take 81 mg by mouth daily. - Oral Class: Historical Med Number of times this order has been changed since signing: 2 Order [address] calcium carbonate (OS-CAL) 1250 MG chewable tablet (Taking) Sig - Route: Chew 2 tablets by mouth daily. - Oral Class: Historical Med Number of times this order has been changed since signing: 2 Order [address] Calcium Carbonate Antacid (ALKA-SELTZER ANTACID PO) (Taking) Sig -  Route: Take 2 tablets by mouth 2 (two) times daily as needed (for upset stomach). - Oral Class: Historical Med Number of times this order has been changed since signing: 2 Order [address] leuprolide (LUPRON) 30 MG injection (Taking) Sig - Route: Inject 30 mg into the muscle every 4 (four) months. - Intramuscular Class: Historical Med Number of times this order has been changed since signing: 3 Order [address] loperamide (IMODIUM) 2 MG capsule (Taking) 30 capsule 0 12/27/2012 Sig - Route: Take 1 capsule (2 mg total) by mouth 4 (four) times daily as needed for diarrhea or loose stools. - Oral Class: Print losartan-hydrochlorothiazide (HYZAAR) 100-12.5 MG per tablet (Taking) Sig - Route: Take 1 tablet by mouth every morning. - Oral Class: Historical Med Number of times this order has been changed since signing: 4 Order [address] predniSONE (DELTASONE) 10 MG tablet (Taking) 30 tablet 0 12/27/2012 Sig: Take 30mg  po daily for 2 days, then 20mg  po daily for 2 days then 10mg  po daily for 2 days then 5mg  daily Class: Print ciprofloxacin (CIPRO) 500 MG tablet 10 tablet 0 12/27/2012 Sig - Route: Take 1 tablet (500 mg total) by mouth 2 (two) times daily. Started 12/19/12, for    Please HPI for pertinent positives, otherwise complete 10 system ROS negative.  Physical Exam: Blood pressure 116/72, pulse 66, temperature 98.4 F (36.9 C), temperature source Oral, resp. rate 18, height 5\' 8"  (1.727 m), weight 239 lb (108.41  kg), SpO2 99.00%. Body mass index is 36.35 kg/(m^2).   General Appearance:  Alert, cooperative, no distress, appears stated age  Head:  Normocephalic, without obvious abnormality, atraumatic  ENT: Unremarkable  Neck: Supple, symmetrical, trachea midline.  Lungs:   Clear to auscultation bilaterally, no w/r/r, respirations unlabored without use of accessory muscles.  Heart:  Regular rate and rhythm, S1, S2 normal, no murmur, rub or gallop.  Abdomen:   Soft, non-tender, non distended. Rt flank PCN  intact, site clean, NT  Neurologic: Normal affect, no gross deficits.   Results for orders placed during the hospital encounter of 01/19/13 (from the past 48 hour(s))  CBC WITH DIFFERENTIAL     Status: Abnormal   Collection Time    01/19/13  7:20 AM      Result Value Range   WBC 6.4  4.0 - 10.5 K/uL   RBC 3.88 (*) 4.22 - 5.81 MIL/uL   Hemoglobin 10.5 (*) 13.0 - 17.0 g/dL   HCT 09.8 (*) 11.9 - 14.7 %   MCV 82.7  78.0 - 100.0 fL   MCH 27.1  26.0 - 34.0 pg   MCHC 32.7  30.0 - 36.0 g/dL   RDW 82.9 (*) 56.2 - 13.0 %   Platelets 185  150 - 400 K/uL   Neutrophils Relative 58  43 - 77 %   Neutro Abs 3.7  1.7 - 7.7 K/uL   Lymphocytes Relative 31  12 - 46 %   Lymphs Abs 1.9  0.7 - 4.0 K/uL   Monocytes Relative 6  3 - 12 %   Monocytes Absolute 0.4  0.1 - 1.0 K/uL   Eosinophils Relative 6 (*) 0 - 5 %   Eosinophils Absolute 0.4  0.0 - 0.7 K/uL   Basophils Relative 0  0 - 1 %   Basophils Absolute 0.0  0.0 - 0.1 K/uL  PROTIME-INR     Status: None   Collection Time    01/19/13  7:20 AM      Result Value Range   Prothrombin Time 12.3  11.6 - 15.2 seconds   INR 0.92  0.00 - 1.49   No results found.  Assessment/Plan Hx prostate cancer Obstructive uropathy Recent right hydronephrosis s/p (R) PCN Discussed plans for nephrostogram and attempt at internalizing to JJ stent. Explained procedure risks, complications, use of sedation. Labs reviewed, ok Consent signed in chart  Brayton El PA-C 01/19/2013, 8:59 AM

## 2013-02-02 ENCOUNTER — Other Ambulatory Visit (HOSPITAL_BASED_OUTPATIENT_CLINIC_OR_DEPARTMENT_OTHER): Payer: Medicare Other | Admitting: Lab

## 2013-02-02 DIAGNOSIS — C61 Malignant neoplasm of prostate: Secondary | ICD-10-CM

## 2013-02-02 LAB — COMPREHENSIVE METABOLIC PANEL (CC13)
ALT: 16 U/L (ref 0–55)
CO2: 28 mEq/L (ref 22–29)
Calcium: 8.9 mg/dL (ref 8.4–10.4)
Chloride: 108 mEq/L — ABNORMAL HIGH (ref 98–107)
Glucose: 109 mg/dl — ABNORMAL HIGH (ref 70–99)
Sodium: 144 mEq/L (ref 136–145)
Total Bilirubin: 0.43 mg/dL (ref 0.20–1.20)
Total Protein: 7 g/dL (ref 6.4–8.3)

## 2013-02-02 LAB — CBC WITH DIFFERENTIAL/PLATELET
BASO%: 1.2 % (ref 0.0–2.0)
Eosinophils Absolute: 0.5 10*3/uL (ref 0.0–0.5)
HCT: 33 % — ABNORMAL LOW (ref 38.4–49.9)
LYMPH%: 25.2 % (ref 14.0–49.0)
MONO#: 0.5 10*3/uL (ref 0.1–0.9)
NEUT#: 4.4 10*3/uL (ref 1.5–6.5)
NEUT%: 59.6 % (ref 39.0–75.0)
Platelets: 227 10*3/uL (ref 140–400)
RBC: 3.98 10*6/uL — ABNORMAL LOW (ref 4.20–5.82)
WBC: 7.3 10*3/uL (ref 4.0–10.3)
lymph#: 1.8 10*3/uL (ref 0.9–3.3)

## 2013-02-02 LAB — PSA: PSA: 142.2 ng/mL — ABNORMAL HIGH (ref <=4.00)

## 2013-02-06 ENCOUNTER — Encounter: Payer: Self-pay | Admitting: Oncology

## 2013-02-06 ENCOUNTER — Telehealth: Payer: Self-pay | Admitting: Oncology

## 2013-02-06 ENCOUNTER — Other Ambulatory Visit: Payer: Medicare Other | Admitting: Lab

## 2013-02-06 ENCOUNTER — Ambulatory Visit (HOSPITAL_BASED_OUTPATIENT_CLINIC_OR_DEPARTMENT_OTHER): Payer: Medicare Other | Admitting: Oncology

## 2013-02-06 VITALS — BP 133/79 | HR 63 | Temp 98.2°F | Resp 20 | Ht 68.0 in | Wt 241.1 lb

## 2013-02-06 DIAGNOSIS — E291 Testicular hypofunction: Secondary | ICD-10-CM

## 2013-02-06 DIAGNOSIS — I10 Essential (primary) hypertension: Secondary | ICD-10-CM

## 2013-02-06 DIAGNOSIS — Z5111 Encounter for antineoplastic chemotherapy: Secondary | ICD-10-CM

## 2013-02-06 DIAGNOSIS — C61 Malignant neoplasm of prostate: Secondary | ICD-10-CM

## 2013-02-06 MED ORDER — ABIRATERONE ACETATE 250 MG PO TABS: 1000.0000 mg | ORAL_TABLET | Freq: Every day | ORAL | Status: DC

## 2013-02-06 MED ORDER — LEUPROLIDE ACETATE (4 MONTH) 30 MG IM KIT
30.0000 mg | PACK | Freq: Once | INTRAMUSCULAR | Status: AC
  Administered 2013-02-06: 30 mg via INTRAMUSCULAR
  Filled 2013-02-06: qty 30

## 2013-02-06 NOTE — Progress Notes (Signed)
Hematology and Oncology Follow Up Visit  [name] [id number] [birth year] 67 y.o. 02/06/2013 12:49 PM   [name], [name]  [name], [name]  Principle Diagnosis:This is a 67 year old gentleman with prostate cancer initially diagnosed in 2001.  He had a Gleason score of 3 + 3 = 6.  He has castration-resistant disease with pelvic adenopathy.    Prior Therapy:  1. Status post prostatectomy done in May 2001.  He had T3 N1 disease.  PSA nadir to 0.  2. The patient developed biochemical relapse and received prostatic bed radiation.  3. The patient received Lupron with Casodex due to rising PSA.  The patient subsequently developed castration-resistant disease. 4. Patient treated with Casodex withdrawal and subsequently PSA rose to 18. 5. Patient with Provenge immunotherapy completed in July 2011. 6.  He received ketoconazole and prednisone December 2011 through January 2014.  Current therapy: 1. He started Zytiga 1000 mg daily with Prednisone 5 mg daily on 11/03/12.  2. He is on Lupron 30 mg every 4 months. He is due for an injection in 01/2013.  Interim History: [name] presents today for a followup visit. He started on Zytiga in 1/2014without any significant toxicity. He did have a hospitalization between 3/11 and 3/19 after he presented with sepsis and acute renal failure. He did require a right nephrostomy tube for hydronephrosis. He was discharged after full recovery of his renal function and has been doing well. He is not reporting any nausea.  He is not reporting any vomiting.  Had not reported any abdominal pain or back pain.  He had not had any other GI toxicities at this time.  Overall, his performance status and activity levels are close to base line.  He continues to perform activities of daily living without any hindrance or decline.  He had not reported any back pain.  Had not reported any genitourinary complaints.     Medications: I have reviewed the patient's  current medications. Current outpatient prescriptions:amoxicillin (AMOXIL) 500 MG capsule, Take 500 mg by mouth. Before dental appointment, Disp: , Rfl: ;  abiraterone Acetate (ZYTIGA) 250 MG tablet, Take 4 tablets (1,000 mg total) by mouth daily., Disp: 120 tablet, Rfl: 1;  amLODipine (NORVASC) 10 MG tablet, Take 10 mg by mouth every morning. , Disp: , Rfl: ;  aspirin 81 MG tablet, Take 81 mg by mouth daily.  , Disp: , Rfl:  calcium carbonate (OS-CAL) 1250 MG chewable tablet, Chew 2 tablets by mouth daily., Disp: , Rfl: ;  leuprolide (LUPRON) 30 MG injection, Inject 30 mg into the muscle every 4 (four) months. , Disp: , Rfl: ;  losartan-hydrochlorothiazide (HYZAAR) 100-12.5 MG per tablet, Take 1 tablet by mouth every morning. , Disp: , Rfl: ;  predniSONE (DELTASONE) 10 MG tablet, 5 mg daily., Disp: , Rfl:   Allergies: No Known Allergies  Past Medical History, Surgical history, Social history, and Family History were reviewed and updated.  Review of Systems: Constitutional:  Negative for fever, chills, night sweats, anorexia, weight loss, pain. Cardiovascular: no chest pain or dyspnea on exertion Respiratory: no cough, shortness of breath, or wheezing Neurological: no TIA or stroke symptoms Dermatological: negative ENT: negative Skin: Negative Gastrointestinal: no abdominal pain, change in bowel habits, or black or bloody stools Genito-Urinary: no dysuria, trouble voiding, or hematuria Hematological and Lymphatic: negative Musculoskeletal: negative Remaining ROS negative.  Physical Exam: Blood pressure 133/79, pulse 63, temperature 98.2 F (36.8 C), temperature source Oral, resp. rate 20, height 5\' 8"  (1.727  m), weight 241 lb 1.6 oz (109.362 kg). ECOG: 1 General appearance: alert Head: Normocephalic, without obvious abnormality, atraumatic Neck: no adenopathy, no carotid bruit, no JVD, supple, symmetrical, trachea midline and thyroid not enlarged, symmetric, no  tenderness/mass/nodules Lymph nodes: Cervical, supraclavicular, and axillary nodes normal. Heart:regular rate and rhythm, S1, S2 normal, no murmur, click, rub or gallop Lung:chest clear, no wheezing, rales, normal symmetric air entry, Heart exam - S1, S2 normal, no murmur, no gallop, rate regular Abdomen: soft, non-tender, without masses or organomegaly YNW:GNFAO edema noted.  Lab Results: Lab Results  Component Value Date   WBC 7.3 02/02/2013   HGB 10.8* 02/02/2013   HCT 33.0* 02/02/2013   MCV 82.9 02/02/2013   PLT 227 02/02/2013     Chemistry      Component Value Date/Time   NA 144 02/02/2013 1024   NA 143 12/27/2012 0415   K 3.9 02/02/2013 1024   K 3.5 12/27/2012 0415   CL 108* 02/02/2013 1024   CL 108 12/27/2012 0415   CO2 28 02/02/2013 1024   CO2 28 12/27/2012 0415   BUN 11.6 02/02/2013 1024   BUN 17 12/27/2012 0415   CREATININE 0.8 02/02/2013 1024   CREATININE 0.91 12/27/2012 0415      Component Value Date/Time   CALCIUM 8.9 02/02/2013 1024   CALCIUM 7.9* 12/27/2012 0415   ALKPHOS 67 02/02/2013 1024   ALKPHOS 71 12/27/2012 0415   AST 15 02/02/2013 1024   AST 17 12/27/2012 0415   ALT 16 02/02/2013 1024   ALT 24 12/27/2012 0415   BILITOT 0.43 02/02/2013 1024   BILITOT 0.3 12/27/2012 0415     Results for [name], [name] (MRN [id number]) as of 02/06/2013 11:25  Ref. Range 10/24/2012 08:08 11/30/2012 08:19 01/01/2013 14:32 02/02/2013 10:24  PSA Latest Range: <=4.00 ng/mL 257.70 (H) 159.30 (H) 352.10 (H) 142.20 (H)   Impression and Plan:   This is a 66 year old gentleman with the following issues: 1. Castration-resistant prostate cancer with pelvic adenopathy. His PSA was up to 352 on ketoconazole. CT scan from 10/2012 showed stable disease, but he had rapid rise in his PSA. He was started on Zytiga with Prednisone in January 2014. With PSA down to 159 in 11/2012. He was taken off his Zytiga during his hospitalization and PSA starting rising again. He has restarted the Zytiga with his PSA dropping  to 142.20. Recommend that he continue Zytiga without dose modification. 2. Actue renal failure due hydronephrosis: He is S/P right  nephrostomy tube on 3/14. Kidney function is back to normal.  3. Sepsis and thrombocytopenia: Unlikely related to [facility] or cancer. Resolved now.  4. Hormonal deprivation.  He continues to be on Lupron. Given today. 5. Hypertension. He is on Norvasc and Hyzaar per PCP.   6. Followup will be in 4-5 weeks.     Clenton Pare 4/29/201412:49 PM

## 2013-02-06 NOTE — Patient Instructions (Addendum)
Results for IRAM, LUNDBERG (MRN [id number]) as of 02/06/2013 11:25  Ref. Range 10/24/2012 08:08 11/30/2012 08:19 01/01/2013 14:32 02/02/2013 10:24  PSA Latest Range: <=4.00 ng/mL 257.70 (H) 159.30 (H) 352.10 (H) 142.20 (H)

## 2013-02-06 NOTE — Telephone Encounter (Signed)
gv and printed appt sched and avs for pt for May and June °

## 2013-02-06 NOTE — Telephone Encounter (Signed)
gv and printed appt sched and avs for May and June

## 2013-02-12 ENCOUNTER — Other Ambulatory Visit: Payer: Self-pay | Admitting: Oncology

## 2013-03-09 ENCOUNTER — Other Ambulatory Visit (HOSPITAL_BASED_OUTPATIENT_CLINIC_OR_DEPARTMENT_OTHER): Payer: Medicare Other | Admitting: Lab

## 2013-03-09 DIAGNOSIS — C61 Malignant neoplasm of prostate: Secondary | ICD-10-CM

## 2013-03-09 LAB — COMPREHENSIVE METABOLIC PANEL (CC13)
ALT: 14 U/L (ref 0–55)
Albumin: 3.5 g/dL (ref 3.5–5.0)
BUN: 19.3 mg/dL (ref 7.0–26.0)
CO2: 24 mEq/L (ref 22–29)
Calcium: 9 mg/dL (ref 8.4–10.4)
Chloride: 108 mEq/L — ABNORMAL HIGH (ref 98–107)
Creatinine: 1 mg/dL (ref 0.7–1.3)
Potassium: 3.7 mEq/L (ref 3.5–5.1)

## 2013-03-09 LAB — PSA: PSA: 175.6 ng/mL — ABNORMAL HIGH (ref <=4.00)

## 2013-03-09 LAB — CBC WITH DIFFERENTIAL/PLATELET
Basophils Absolute: 0.1 10*3/uL (ref 0.0–0.1)
HCT: 35.6 % — ABNORMAL LOW (ref 38.4–49.9)
HGB: 11.4 g/dL — ABNORMAL LOW (ref 13.0–17.1)
MONO#: 0.5 10*3/uL (ref 0.1–0.9)
NEUT#: 4.2 10*3/uL (ref 1.5–6.5)
NEUT%: 59.6 % (ref 39.0–75.0)
WBC: 7 10*3/uL (ref 4.0–10.3)
lymph#: 2 10*3/uL (ref 0.9–3.3)

## 2013-03-13 ENCOUNTER — Ambulatory Visit (HOSPITAL_BASED_OUTPATIENT_CLINIC_OR_DEPARTMENT_OTHER): Payer: Medicare Other | Admitting: Oncology

## 2013-03-13 ENCOUNTER — Telehealth: Payer: Self-pay | Admitting: Oncology

## 2013-03-13 VITALS — BP 127/75 | HR 72 | Temp 97.9°F | Resp 20 | Wt 238.6 lb

## 2013-03-13 DIAGNOSIS — C61 Malignant neoplasm of prostate: Secondary | ICD-10-CM

## 2013-03-13 DIAGNOSIS — R599 Enlarged lymph nodes, unspecified: Secondary | ICD-10-CM

## 2013-03-13 DIAGNOSIS — I10 Essential (primary) hypertension: Secondary | ICD-10-CM

## 2013-03-13 NOTE — Progress Notes (Signed)
Hematology and Oncology Follow Up Visit  [name] [id number] [birth year] 67 y.o. 03/13/2013 9:38 AM   [name], [name]  [name], [name]  Principle Diagnosis:This is a 67 year old gentleman with prostate cancer initially diagnosed in 2001.  He had a Gleason score of 3 + 3 = 6.  He has castration-resistant disease with pelvic adenopathy.    Prior Therapy:  1. Status post prostatectomy done in May 2001.  He had T3 N1 disease.  PSA nadir to 0.  2. The patient developed biochemical relapse and received prostatic bed radiation.  3. The patient received Lupron with Casodex due to rising PSA.  The patient subsequently developed castration-resistant disease. 4. Patient treated with Casodex withdrawal and subsequently PSA rose to 18. 5. Patient with Provenge immunotherapy completed in July 2011. 6.  He received ketoconazole and prednisone December 2011 through January 2014.  Current therapy: 1. He started Zytiga 1000 mg daily with Prednisone 5 mg daily on 11/03/12.  2. He is on Lupron 30 mg every 4 months. He is due for an injection in 01/2013.  Interim History: [name] presents today for a followup visit. He started on Zytiga in 1/2014without any significant toxicity. He did have a hospitalization between 3/11 and 3/19 after he presented with sepsis and acute renal failure. He did require a right nephrostomy tube for hydronephrosis. He was discharged after full recovery of his renal function and has been doing well. He is not reporting any nausea.  He is not reporting any vomiting.  Had not reported any abdominal pain or back pain.  He had not had any other GI toxicities at this time.  Overall, his performance status and activity levels are close to base line.  He continues to perform activities of daily living without any hindrance or decline.  He had not reported any back pain.  Had not reported any genitourinary complaints. No new complications from Zytiga.      Medications: I have reviewed the patient's current medications. Current Outpatient Prescriptions  Medication Sig Dispense Refill  . abiraterone Acetate (ZYTIGA) 250 MG tablet Take 4 tablets (1,000 mg total) by mouth daily.  120 tablet  1  . amLODipine (NORVASC) 10 MG tablet Take 10 mg by mouth every morning.       [name] amoxicillin (AMOXIL) 500 MG capsule Take 500 mg by mouth. Before dental appointment      . aspirin 81 MG tablet Take 81 mg by mouth daily.        . calcium carbonate (OS-CAL) 1250 MG chewable tablet Chew 2 tablets by mouth daily.      [name] leuprolide (LUPRON) 30 MG injection Inject 30 mg into the muscle every 4 (four) months.       [name] losartan-hydrochlorothiazide (HYZAAR) 100-12.5 MG per tablet Take 1 tablet by mouth every morning.       . predniSONE (DELTASONE) 10 MG tablet 5 mg daily.       No current facility-administered medications for this visit.    Allergies: No Known Allergies  Past Medical History, Surgical history, Social history, and Family History were reviewed and updated.  Review of Systems: Constitutional:  Negative for fever, chills, night sweats, anorexia, weight loss, pain. Cardiovascular: no chest pain or dyspnea on exertion Respiratory: no cough, shortness of breath, or wheezing Neurological: no TIA or stroke symptoms Dermatological: negative ENT: negative Skin: Negative Gastrointestinal: no abdominal pain, change in bowel habits, or black or bloody stools Genito-Urinary: no dysuria, trouble voiding, or hematuria  Hematological and Lymphatic: negative Musculoskeletal: negative Remaining ROS negative.  Physical Exam: Blood pressure 127/75, pulse 72, temperature 97.9 F (36.6 C), temperature source Oral, resp. rate 20, weight 238 lb 9 oz (108.211 kg). ECOG: 1 General appearance: alert Head: Normocephalic, without obvious abnormality, atraumatic Neck: no adenopathy, no carotid bruit, no JVD, supple, symmetrical, trachea midline and thyroid not  enlarged, symmetric, no tenderness/mass/nodules Lymph nodes: Cervical, supraclavicular, and axillary nodes normal. Heart:regular rate and rhythm, S1, S2 normal, no murmur, click, rub or gallop Lung:chest clear, no wheezing, rales, normal symmetric air entry, Heart exam - S1, S2 normal, no murmur, no gallop, rate regular Abdomen: soft, non-tender, without masses or organomegaly ZOX:WRUEA edema noted.  Lab Results: Lab Results  Component Value Date   WBC 7.0 03/09/2013   HGB 11.4* 03/09/2013   HCT 35.6* 03/09/2013   MCV 82.8 03/09/2013   PLT 190 03/09/2013     Chemistry      Component Value Date/Time   NA 142 03/09/2013 0923   NA 143 12/27/2012 0415   K 3.7 03/09/2013 0923   K 3.5 12/27/2012 0415   CL 108* 03/09/2013 0923   CL 108 12/27/2012 0415   CO2 24 03/09/2013 0923   CO2 28 12/27/2012 0415   BUN 19.3 03/09/2013 0923   BUN 17 12/27/2012 0415   CREATININE 1.0 03/09/2013 0923   CREATININE 0.91 12/27/2012 0415      Component Value Date/Time   CALCIUM 9.0 03/09/2013 0923   CALCIUM 7.9* 12/27/2012 0415   ALKPHOS 68 03/09/2013 0923   ALKPHOS 71 12/27/2012 0415   AST 14 03/09/2013 0923   AST 17 12/27/2012 0415   ALT 14 03/09/2013 0923   ALT 24 12/27/2012 0415   BILITOT 0.50 03/09/2013 0923   BILITOT 0.3 12/27/2012 0415     Results for [name], [name] (MRN [id number]) as of 03/13/2013 09:15  Ref. Range 01/01/2013 14:32 02/02/2013 10:24 03/09/2013 09:23  PSA Latest Range: <=4.00 ng/mL 352.10 (H) 142.20 (H) 175.60 (H)    Impression and Plan:  This is a 67 year old gentleman with the following issues: 1. Castration-resistant prostate cancer with pelvic adenopathy. His PSA was up to 352 on ketoconazole. CT scan from 10/2012 showed stable disease, but he had rapid rise in his PSA. He was started on Zytiga with Prednisone in January 2014. With PSA down to 159 in 11/2012. He was taken off his Zytiga during his hospitalization and PSA starting rising again. He has restarted the Zytiga with his PSA dropping to  175. Recommend that he continue Zytiga without dose modification. I will repeat CT scan in 05/2013. 2. Actue renal failure due hydronephrosis: He is S/P right  nephrostomy tube on 3/14. Kidney function is back to normal.  3. Sepsis and thrombocytopenia: Unlikely related to [facility] or cancer. Resolved now.  4. Hormonal deprivation.  He continues to be on Lupron. To be given in 05/2013.  5. Hypertension. He is on Norvasc and Hyzaar per PCP.   6. Followup will be in 4-5 weeks.     [name],[name] 6/3/20149:38 AM

## 2013-03-13 NOTE — Telephone Encounter (Signed)
Gave pt appt for lab and MD for July and August 2014 lab and MD gave pt oral contrast for CT

## 2013-03-21 ENCOUNTER — Telehealth: Payer: Self-pay | Admitting: *Deleted

## 2013-03-21 ENCOUNTER — Telehealth: Payer: Self-pay | Admitting: Dietician

## 2013-03-21 NOTE — Telephone Encounter (Signed)
sw pt informed him that we needed for him to come in 15 mins early. Pt is ok with coming in to see FNS on 04/17/13 10:45am...td

## 2013-03-22 ENCOUNTER — Telehealth: Payer: Self-pay | Admitting: *Deleted

## 2013-03-22 NOTE — Telephone Encounter (Signed)
Lm made pt aware to come in 15 mins early for his appt on 05/17/13 @ 10:45...td

## 2013-04-11 ENCOUNTER — Encounter: Payer: Self-pay | Admitting: Gastroenterology

## 2013-04-11 ENCOUNTER — Other Ambulatory Visit (HOSPITAL_BASED_OUTPATIENT_CLINIC_OR_DEPARTMENT_OTHER): Payer: Medicare Other | Admitting: Lab

## 2013-04-11 DIAGNOSIS — C61 Malignant neoplasm of prostate: Secondary | ICD-10-CM

## 2013-04-11 LAB — CBC WITH DIFFERENTIAL/PLATELET
Basophils Absolute: 0.1 10*3/uL (ref 0.0–0.1)
EOS%: 2.7 % (ref 0.0–7.0)
Eosinophils Absolute: 0.2 10*3/uL (ref 0.0–0.5)
HCT: 34 % — ABNORMAL LOW (ref 38.4–49.9)
HGB: 11.1 g/dL — ABNORMAL LOW (ref 13.0–17.1)
MONO#: 0.6 10*3/uL (ref 0.1–0.9)
NEUT#: 4.4 10*3/uL (ref 1.5–6.5)
NEUT%: 62.7 % (ref 39.0–75.0)
RDW: 15.9 % — ABNORMAL HIGH (ref 11.0–14.6)
WBC: 7 10*3/uL (ref 4.0–10.3)
lymph#: 1.7 10*3/uL (ref 0.9–3.3)

## 2013-04-11 LAB — COMPREHENSIVE METABOLIC PANEL (CC13)
AST: 13 U/L (ref 5–34)
Albumin: 3.3 g/dL — ABNORMAL LOW (ref 3.5–5.0)
BUN: 11.8 mg/dL (ref 7.0–26.0)
CO2: 27 mEq/L (ref 22–29)
Calcium: 9.1 mg/dL (ref 8.4–10.4)
Chloride: 109 mEq/L (ref 98–109)
Creatinine: 0.9 mg/dL (ref 0.7–1.3)
Glucose: 100 mg/dl (ref 70–140)
Potassium: 3.5 mEq/L (ref 3.5–5.1)

## 2013-04-11 LAB — PSA: PSA: 190.8 ng/mL — ABNORMAL HIGH (ref <=4.00)

## 2013-04-17 ENCOUNTER — Ambulatory Visit (HOSPITAL_BASED_OUTPATIENT_CLINIC_OR_DEPARTMENT_OTHER): Payer: Medicare Other | Admitting: Oncology

## 2013-04-17 ENCOUNTER — Other Ambulatory Visit: Payer: Medicare Other | Admitting: Lab

## 2013-04-17 VITALS — BP 135/70 | HR 65 | Temp 98.5°F | Resp 18 | Ht 68.0 in | Wt 239.9 lb

## 2013-04-17 DIAGNOSIS — C61 Malignant neoplasm of prostate: Secondary | ICD-10-CM

## 2013-04-17 NOTE — Progress Notes (Signed)
Hematology and Oncology Follow Up Visit  [name] [id number] 1946-09-21 67 y.o. 04/17/2013 10:20 AM   [name], [name]  [name], [name]  Principle Diagnosis:This is a 67 year old gentleman with prostate cancer initially diagnosed in 2001.  He had a Gleason score of 3 + 3 = 6.  He has castration-resistant disease with pelvic adenopathy.    Prior Therapy:  1. Status post prostatectomy done in May 2001.  He had T3 N1 disease.  PSA nadir to 0.  2. The patient developed biochemical relapse and received prostatic bed radiation.  3. The patient received Lupron with Casodex due to rising PSA.  The patient subsequently developed castration-resistant disease. 4. Patient treated with Casodex withdrawal and subsequently PSA rose to 18. 5. Patient with Provenge immunotherapy completed in July 2011. 6.  He received ketoconazole and prednisone December 2011 through January 2014.  Current therapy: 1. He started Zytiga 1000 mg daily with Prednisone 5 mg daily on 11/03/12.  2. He is on Lupron 30 mg every 4 months. He is due for an injection in 06/2013.  Interim History: [name] presents today for a followup visit. He started on Zytiga in 1/2014without any significant toxicity. He did have a hospitalization between 3/11 and 3/19 after he presented with sepsis and acute renal failure. He did require a right nephrostomy tube for hydronephrosis. He was discharged after full recovery of his renal function and has been doing well. He is not reporting any nausea.  He is not reporting any vomiting.  Had not reported any abdominal pain or back pain.  He had not had any other GI toxicities at this time.  Overall, his performance status and activity levels are close to base line.  He continues to perform activities of daily living without any hindrance or decline.  He had not reported any back pain.  Had not reported any genitourinary complaints. No new complications from Zytiga.    No recent  illnesses or hospitalizations.   Medications: I have reviewed the patient's current medications. Current Outpatient Prescriptions  Medication Sig Dispense Refill  . abiraterone Acetate (ZYTIGA) 250 MG tablet Take 4 tablets (1,000 mg total) by mouth daily.  120 tablet  1  . amLODipine (NORVASC) 10 MG tablet Take 10 mg by mouth every morning.       [name] amoxicillin (AMOXIL) 500 MG capsule Take 500 mg by mouth. Before dental appointment      . aspirin 81 MG tablet Take 81 mg by mouth daily.        . calcium carbonate (OS-CAL) 1250 MG chewable tablet Chew 2 tablets by mouth daily.      [name] losartan-hydrochlorothiazide (HYZAAR) 100-12.5 MG per tablet Take 1 tablet by mouth every morning.       . predniSONE (DELTASONE) 10 MG tablet 5 mg daily.      [name] leuprolide (LUPRON) 30 MG injection Inject 30 mg into the muscle every 4 (four) months.        No current facility-administered medications for this visit.    Allergies: No Known Allergies  Past Medical History, Surgical history, Social history, and Family History were reviewed and updated.  Review of Systems: Constitutional:  Negative for fever, chills, night sweats, anorexia, weight loss, pain. Cardiovascular: no chest pain or dyspnea on exertion Respiratory: no cough, shortness of breath, or wheezing Neurological: no TIA or stroke symptoms Dermatological: negative ENT: negative Skin: Negative Gastrointestinal: no abdominal pain, change in bowel habits, or black or bloody stools Genito-Urinary:  no dysuria, trouble voiding, or hematuria Hematological and Lymphatic: negative Musculoskeletal: negative Remaining ROS negative.  Physical Exam: Blood pressure 135/70, pulse 65, temperature 98.5 F (36.9 C), temperature source Oral, resp. rate 18, height 5\' 8"  (1.727 m), weight 239 lb 14.4 oz (108.818 kg). ECOG: 1 General appearance: alert Head: Normocephalic, without obvious abnormality, atraumatic Neck: no adenopathy, no carotid bruit, no JVD,  supple, symmetrical, trachea midline and thyroid not enlarged, symmetric, no tenderness/mass/nodules Lymph nodes: Cervical, supraclavicular, and axillary nodes normal. Heart:regular rate and rhythm, S1, S2 normal, no murmur, click, rub or gallop Lung:chest clear, no wheezing, rales, normal symmetric air entry, Heart exam - S1, S2 normal, no murmur, no gallop, rate regular Abdomen: soft, non-tender, without masses or organomegaly [name]:WGNFA edema noted.  Lab Results: Lab Results  Component Value Date   WBC 7.0 04/11/2013   HGB 11.1* 04/11/2013   HCT 34.0* 04/11/2013   MCV 82.0 04/11/2013   PLT 189 04/11/2013     Chemistry      Component Value Date/Time   NA 144 04/11/2013 1056   NA 143 12/27/2012 0415   K 3.5 04/11/2013 1056   K 3.5 12/27/2012 0415   CL 108* 03/09/2013 0923   CL 108 12/27/2012 0415   CO2 27 04/11/2013 1056   CO2 28 12/27/2012 0415   BUN 11.8 04/11/2013 1056   BUN 17 12/27/2012 0415   CREATININE 0.9 04/11/2013 1056   CREATININE 0.91 12/27/2012 0415      Component Value Date/Time   CALCIUM 9.1 04/11/2013 1056   CALCIUM 7.9* 12/27/2012 0415   ALKPHOS 58 04/11/2013 1056   ALKPHOS 71 12/27/2012 0415   AST 13 04/11/2013 1056   AST 17 12/27/2012 0415   ALT 14 04/11/2013 1056   ALT 24 12/27/2012 0415   BILITOT 0.48 04/11/2013 1056   BILITOT 0.3 12/27/2012 0415      Results for LABARRON, DURNIN (MRN [id number]) as of 04/17/2013 10:10  Ref. Range 02/02/2013 10:24 03/09/2013 09:23 04/11/2013 10:56  PSA Latest Range: <=4.00 ng/mL 142.20 (H) 175.60 (H) 190.80 (H)    Impression and Plan:  This is a 67 year old gentleman with the following issues: 1. Castration-resistant prostate cancer with pelvic adenopathy. He was started on Zytiga with Prednisone in January 2014. With PSA down to 159 in 11/2012. He was taken off his Zytiga during his hospitalization and PSA starting rising again. He has restarted the Zytiga with his PSA dropping to 190 recently. Recommend that he continue Zytiga without dose modification. I  will repeat CT scan in 05/2013. 2. Actue renal failure due hydronephrosis: He is S/P right  nephrostomy tube on 3/14. Kidney function is back to normal.  3. Sepsis and thrombocytopenia: Unlikely related to [facility] or cancer. Resolved now.  4. Hormonal deprivation.  He continues to be on Lupron. To be given in 06/2013.  5. Hypertension. He is on Norvasc and Hyzaar per PCP.   6. Followup will be in 4 weeks.     Silvino Selman 7/8/201410:20 AM

## 2013-04-28 ENCOUNTER — Other Ambulatory Visit: Payer: Self-pay | Admitting: Oncology

## 2013-05-09 ENCOUNTER — Ambulatory Visit (AMBULATORY_SURGERY_CENTER): Payer: Medicare Other | Admitting: *Deleted

## 2013-05-09 VITALS — Ht 68.0 in | Wt 240.4 lb

## 2013-05-09 DIAGNOSIS — Z86010 Personal history of colonic polyps: Secondary | ICD-10-CM

## 2013-05-09 MED ORDER — MOVIPREP 100 G PO SOLR: 1.0000 | Freq: Once | ORAL | Status: DC

## 2013-05-09 NOTE — Progress Notes (Signed)
No egg or soy allergy. No anesthesia problems.  

## 2013-05-10 ENCOUNTER — Encounter: Payer: Self-pay | Admitting: Gastroenterology

## 2013-05-14 ENCOUNTER — Telehealth: Payer: Self-pay | Admitting: Gastroenterology

## 2013-05-14 DIAGNOSIS — Z86010 Personal history of colonic polyps: Secondary | ICD-10-CM

## 2013-05-14 MED ORDER — MOVIPREP 100 G PO SOLR: 1.0000 | Freq: Once | ORAL | Status: DC

## 2013-05-14 NOTE — Telephone Encounter (Signed)
Resent MoviPrep to [employer], faxed [phone]. Called patient no answer,left message.

## 2013-05-15 ENCOUNTER — Encounter (HOSPITAL_COMMUNITY): Payer: Self-pay

## 2013-05-15 ENCOUNTER — Ambulatory Visit (HOSPITAL_COMMUNITY)
Admission: RE | Admit: 2013-05-15 | Discharge: 2013-05-15 | Disposition: A | Discharge status: Home / Self Care | Payer: Medicare Other | Source: Ambulatory Visit | Attending: Oncology | Admitting: Oncology

## 2013-05-15 ENCOUNTER — Other Ambulatory Visit (HOSPITAL_BASED_OUTPATIENT_CLINIC_OR_DEPARTMENT_OTHER): Payer: Medicare Other | Admitting: Lab

## 2013-05-15 DIAGNOSIS — N1330 Unspecified hydronephrosis: Secondary | ICD-10-CM | POA: Insufficient documentation

## 2013-05-15 DIAGNOSIS — C772 Secondary and unspecified malignant neoplasm of intra-abdominal lymph nodes: Secondary | ICD-10-CM | POA: Insufficient documentation

## 2013-05-15 DIAGNOSIS — C61 Malignant neoplasm of prostate: Secondary | ICD-10-CM | POA: Insufficient documentation

## 2013-05-15 DIAGNOSIS — E278 Other specified disorders of adrenal gland: Secondary | ICD-10-CM | POA: Insufficient documentation

## 2013-05-15 LAB — CBC WITH DIFFERENTIAL/PLATELET
EOS%: 3.8 % (ref 0.0–7.0)
Eosinophils Absolute: 0.3 10*3/uL (ref 0.0–0.5)
HGB: 11.3 g/dL — ABNORMAL LOW (ref 13.0–17.1)
MCV: 83.2 fL (ref 79.3–98.0)
MONO%: 6.1 % (ref 0.0–14.0)
NEUT#: 4.2 10*3/uL (ref 1.5–6.5)
RBC: 4.29 10*6/uL (ref 4.20–5.82)
RDW: 15.6 % — ABNORMAL HIGH (ref 11.0–14.6)
lymph#: 2 10*3/uL (ref 0.9–3.3)
nRBC: 0 % (ref 0–0)

## 2013-05-15 LAB — COMPREHENSIVE METABOLIC PANEL (CC13)
ALT: 17 U/L (ref 0–55)
Albumin: 3.5 g/dL (ref 3.5–5.0)
Alkaline Phosphatase: 68 U/L (ref 40–150)
CO2: 28 mEq/L (ref 22–29)
Potassium: 4 mEq/L (ref 3.5–5.1)
Sodium: 145 mEq/L (ref 136–145)
Total Bilirubin: 0.63 mg/dL (ref 0.20–1.20)
Total Protein: 7.2 g/dL (ref 6.4–8.3)

## 2013-05-15 MED ORDER — IOHEXOL 300 MG/ML  SOLN
100.0000 mL | Freq: Once | INTRAMUSCULAR | Status: AC | PRN
  Administered 2013-05-15: 100 mL via INTRAVENOUS

## 2013-05-16 ENCOUNTER — Encounter: Payer: Medicare Other | Admitting: Gastroenterology

## 2013-05-16 ENCOUNTER — Other Ambulatory Visit: Payer: Self-pay

## 2013-05-17 ENCOUNTER — Other Ambulatory Visit: Payer: Self-pay | Admitting: *Deleted

## 2013-05-17 ENCOUNTER — Ambulatory Visit: Payer: Medicare Other | Admitting: Oncology

## 2013-05-17 ENCOUNTER — Ambulatory Visit (HOSPITAL_BASED_OUTPATIENT_CLINIC_OR_DEPARTMENT_OTHER): Payer: Medicare Other | Admitting: Oncology

## 2013-05-17 VITALS — BP 114/65 | HR 64 | Temp 98.3°F | Resp 20 | Ht 68.0 in | Wt 240.6 lb

## 2013-05-17 DIAGNOSIS — I10 Essential (primary) hypertension: Secondary | ICD-10-CM

## 2013-05-17 DIAGNOSIS — N1330 Unspecified hydronephrosis: Secondary | ICD-10-CM

## 2013-05-17 DIAGNOSIS — C61 Malignant neoplasm of prostate: Secondary | ICD-10-CM

## 2013-05-17 DIAGNOSIS — N179 Acute kidney failure, unspecified: Secondary | ICD-10-CM

## 2013-05-17 DIAGNOSIS — R599 Enlarged lymph nodes, unspecified: Secondary | ICD-10-CM

## 2013-05-17 MED ORDER — ABIRATERONE ACETATE 250 MG PO TABS: 1000.0000 mg | ORAL_TABLET | Freq: Every day | ORAL | Status: DC

## 2013-05-17 NOTE — Progress Notes (Signed)
Hematology and Oncology Follow Up Visit  [name] [id number] January 20, 1946 67 y.o. 05/17/2013 10:53 AM   [name], [name]  [name], [name]  Principle Diagnosis:This is a 67 year old gentleman with prostate cancer initially diagnosed in 2001.  He had a Gleason score of 3 + 3 = 6.  He has castration-resistant disease with pelvic adenopathy.    Prior Therapy:  1. Status post prostatectomy done in May 2001.  He had T3 N1 disease.  PSA nadir to 0.  2. The patient developed biochemical relapse and received prostatic bed radiation.  3. The patient received Lupron with Casodex due to rising PSA.  The patient subsequently developed castration-resistant disease. 4. Patient treated with Casodex withdrawal and subsequently PSA rose to 18. 5. Patient with Provenge immunotherapy completed in July 2011. 6.  He received ketoconazole and prednisone December 2011 through January 2014.  Current therapy: 1. He started Zytiga 1000 mg daily with Prednisone 5 mg daily on 11/03/12.  2. He is on Lupron 30 mg every 4 months. He is due for an injection in 06/2013.  Interim History: [name] presents today for a followup visit. He started on Zytiga in 1/2014without any significant toxicity. He did have a hospitalization between 3/11 and 3/19 after he presented with sepsis and acute renal failure. He did require a right nephrostomy tube for hydronephrosis. He was discharged after full recovery of his renal function and has been doing well. He is not reporting any nausea.  He is not reporting any vomiting.  Had not reported any abdominal pain or back pain.  He had not had any other GI toxicities at this time.  Overall, his performance status and activity levels are close to base line.  He continues to perform activities of daily living without any hindrance or decline.  He had not reported any back pain.  Had not reported any genitourinary complaints. No new complications from Zytiga.  He reports  slight dizziness when he moves his head quickly but no other new symptoms.  Medications: I have reviewed the patient's current medications. Current Outpatient Prescriptions  Medication Sig Dispense Refill  . amLODipine (NORVASC) 10 MG tablet Take 10 mg by mouth every morning.       [name] amoxicillin (AMOXIL) 500 MG capsule Take 500 mg by mouth. Before dental appointment      . aspirin 81 MG tablet Take 81 mg by mouth daily.        . calcium carbonate (OS-CAL) 1250 MG chewable tablet Chew 2 tablets by mouth daily.      [name] leuprolide (LUPRON) 30 MG injection Inject 30 mg into the muscle every 4 (four) months.       [name] losartan-hydrochlorothiazide (HYZAAR) 100-12.5 MG per tablet Take 1 tablet by mouth every morning.       [name] MOVIPREP 100 G SOLR Take 1 kit (200 g total) by mouth once.  1 kit  0  . predniSONE (DELTASONE) 5 MG tablet TAKE 1 TABLET DAILY  180 tablet  0  . abiraterone Acetate (ZYTIGA) 250 MG tablet Take 4 tablets (1,000 mg total) by mouth daily.  120 tablet  1   No current facility-administered medications for this visit.    Allergies: No Known Allergies  Past Medical History, Surgical history, Social history, and Family History were reviewed and updated.  Review of Systems: Constitutional:  Negative for fever, chills, night sweats, anorexia, weight loss, pain. Cardiovascular: no chest pain or dyspnea on exertion Respiratory: no cough, shortness of  breath, or wheezing Neurological: no TIA or stroke symptoms Dermatological: negative ENT: negative Skin: Negative Gastrointestinal: no abdominal pain, change in bowel habits, or black or bloody stools Genito-Urinary: no dysuria, trouble voiding, or hematuria Hematological and Lymphatic: negative Musculoskeletal: negative Remaining ROS negative.  Physical Exam: Blood pressure 114/65, pulse 64, temperature 98.3 F (36.8 C), temperature source Oral, resp. rate 20, height 5\' 8"  (1.727 m), weight 240 lb 9.6 oz (109.135 kg). ECOG:  1 General appearance: alert Head: Normocephalic, without obvious abnormality, atraumatic Neck: no adenopathy, no carotid bruit, no JVD, supple, symmetrical, trachea midline and thyroid not enlarged, symmetric, no tenderness/mass/nodules Lymph nodes: Cervical, supraclavicular, and axillary nodes normal. Heart:regular rate and rhythm, S1, S2 normal, no murmur, click, rub or gallop Lung:chest clear, no wheezing, rales, normal symmetric air entry, Heart exam - S1, S2 normal, no murmur, no gallop, rate regular Abdomen: soft, non-tender, without masses or organomegaly ZOX:WRUEA edema noted.  Lab Results: Lab Results  Component Value Date   WBC 6.9 05/15/2013   HGB 11.3* 05/15/2013   HCT 35.7* 05/15/2013   MCV 83.2 05/15/2013   PLT 218 05/15/2013     Chemistry      Component Value Date/Time   NA 145 05/15/2013 1008   NA 143 12/27/2012 0415   K 4.0 05/15/2013 1008   K 3.5 12/27/2012 0415   CL 108* 03/09/2013 0923   CL 108 12/27/2012 0415   CO2 28 05/15/2013 1008   CO2 28 12/27/2012 0415   BUN 13.4 05/15/2013 1008   BUN 17 12/27/2012 0415   CREATININE 1.1 05/15/2013 1008   CREATININE 0.91 12/27/2012 0415      Component Value Date/Time   CALCIUM 9.4 05/15/2013 1008   CALCIUM 7.9* 12/27/2012 0415   ALKPHOS 68 05/15/2013 1008   ALKPHOS 71 12/27/2012 0415   AST 16 05/15/2013 1008   AST 17 12/27/2012 0415   ALT 17 05/15/2013 1008   ALT 24 12/27/2012 0415   BILITOT 0.63 05/15/2013 1008   BILITOT 0.3 12/27/2012 0415      Results for [name] (MRN [id number]) as of 05/17/2013 10:43  Ref. Range 04/11/2013 10:56 05/15/2013 10:09  PSA Latest Range: <=4.00 ng/mL 190.80 (H) 176.70 (H)    CT ABDOMEN AND PELVIS WITH CONTRAST  Technique: Multidetector CT imaging of the abdomen and pelvis was  performed following the standard protocol during bolus  administration of intravenous contrast.  Contrast: OMNIPAQUE IOHEXOL 300 MG/ML SOLN  Comparison: 10/24/2012  Findings: The lung bases are clear.  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  1.9 cm porta hepatis node is larger, previously 1.4 cm.  Mass-like enlargement of the left adrenal gland is stable measuring  1.8 cm image 21.  Confluent retroperitoneal lymphadenopathy is slightly larger  measuring 6.8 x 2.3 cm (previously 6.5 x 2.5 cm).  Increased aortocaval lymphadenopathy now 2.9 cm image 34,  previously 2.5 cm.  Low-density renal cortical lesions are unchanged with the exception  of increase in size of now 2.7 cm mixed density left upper renal  pole exophytic mass image 16. The spleen, pancreas, and liver are  unremarkable. Minimal nodularity to the right adrenal gland is  subjectively stable, for example image 15.  Interval placement of right nephroureteral stent with moderate  right hydroureteronephrosis to the level the decompressed bladder,  increased since prior study. Mild right periureteral stranding is  identified.  Penile  implant in place. Collateral vessels are present in the  pelvis. 1.4 cm left pelvic lymph node is stable, image 59. 1.1 cm  right common iliac chain lymph node image 50 is slightly larger,  previously 0.9 cm at the same anatomic level. Bowel is  unremarkable. Left inguinal clips are re-identified.  No lytic or sclerotic osseous lesions. Lumbar spine degenerative  change re-identified.  IMPRESSION:  Interval progression of metastatic lymphadenopathy.  Increased right moderate hydroureteronephrosis with nephroureteral  stent in place.  Stable adrenal nodularity.  Interval increase in size of the heterogeneous left upper renal  pole mass suspicious for renal cell carcinoma or metastatic  disease.  Impression and Plan:  This is a 67 year old gentleman with the following issues: 1. Castration-resistant prostate cancer with pelvic adenopathy. He was started on Zytiga  with Prednisone in January 2014. With PSA down to 159 in 11/2012. He was taken off his Zytiga during his hospitalization and PSA starting rising again. He has restarted the Zytiga with his PSA dropping to 176 recently. CT scan from 8/5 was discussed today and showed overall slight progression of his lymph nodes. We will continue Zytiga for now. If he developed further progression, we can consider chemotherapy.  2. Actue renal failure due hydronephrosis: He is S/P right  nephrostomy tube on 3/14. Kidney function is back to normal.  3. Sepsis and thrombocytopenia: Unlikely related to [facility] or cancer. Resolved now.  4. Hormonal deprivation.  He continues to be on Lupron. To be given in 06/2013.  5. Hypertension. He is on Norvasc and Hyzaar per PCP.   6. Followup will be in 4 weeks.     Romy Mcgue 8/7/201410:53 AM

## 2013-05-17 NOTE — Telephone Encounter (Signed)
gv and printed appt sched and avs for pt  °

## 2013-05-23 ENCOUNTER — Encounter: Payer: Self-pay | Admitting: Gastroenterology

## 2013-05-23 ENCOUNTER — Ambulatory Visit (AMBULATORY_SURGERY_CENTER): Payer: Medicare Other | Admitting: Gastroenterology

## 2013-05-23 VITALS — BP 149/71 | HR 58 | Temp 97.7°F | Resp 17 | Ht 68.0 in | Wt 240.0 lb

## 2013-05-23 DIAGNOSIS — D126 Benign neoplasm of colon, unspecified: Secondary | ICD-10-CM

## 2013-05-23 DIAGNOSIS — Z1211 Encounter for screening for malignant neoplasm of colon: Secondary | ICD-10-CM

## 2013-05-23 DIAGNOSIS — Z86010 Personal history of colonic polyps: Secondary | ICD-10-CM

## 2013-05-23 HISTORY — PX: COLONOSCOPY W/ POLYPECTOMY: SHX1380

## 2013-05-23 MED ORDER — SODIUM CHLORIDE 0.9 % IV SOLN: 500.0000 mL | INTRAVENOUS | Status: DC

## 2013-05-23 NOTE — Patient Instructions (Addendum)
YOU HAD AN ENDOSCOPIC PROCEDURE TODAY AT THE [facility]: Refer to the procedure report that was given to you for any specific questions about what was found during the examination.  If the procedure report does not answer your questions, please call your gastroenterologist to clarify.  If you requested that your care partner not be given the details of your procedure findings, then the procedure report has been included in a sealed envelope for you to review at your convenience later.  YOU SHOULD EXPECT: Some feelings of bloating in the abdomen. Passage of more gas than usual.  Walking can help get rid of the air that was put into your GI tract during the procedure and reduce the bloating. If you had a lower endoscopy (such as a colonoscopy or flexible sigmoidoscopy) you may notice spotting of blood in your stool or on the toilet paper. If you underwent a bowel prep for your procedure, then you may not have a normal bowel movement for a few days.  DIET: Your first meal following the procedure should be a light meal and then it is ok to progress to your normal diet.  A half-sandwich or bowl of soup is an example of a good first meal.  Heavy or fried foods are harder to digest and may make you feel nauseous or bloated.  Likewise meals heavy in dairy and vegetables can cause extra gas to form and this can also increase the bloating.  Drink plenty of fluids but you should avoid alcoholic beverages for 24 hours.  ACTIVITY: Your care partner should take you home directly after the procedure.  You should plan to take it easy, moving slowly for the rest of the day.  You can resume normal activity the day after the procedure however you should NOT DRIVE or use heavy machinery for 24 hours (because of the sedation medicines used during the test).    SYMPTOMS TO REPORT IMMEDIATELY: A gastroenterologist can be reached at any hour.  During normal business hours, 8:30 AM to 5:00 PM Monday through Friday,  call [phone].  After hours and on weekends, please call the GI answering service at [phone] emergency number who will take a message and have the physician on call contact you.   Following lower endoscopy (colonoscopy or flexible sigmoidoscopy):  Excessive amounts of blood in the stool  Significant tenderness or worsening of abdominal pains  Swelling of the abdomen that is new, acute  Fever of 100F or higher  FOLLOW UP: If any biopsies were taken you will be contacted by phone or by letter within the next 1-3 weeks.  Call your gastroenterologist if you have not heard about the biopsies in 3 weeks.  Our staff will call the home number listed on your records the next business day following your procedure to check on you and address any questions or concerns that you may have at that time regarding the information given to you following your procedure. This is a courtesy call and so if there is no answer at the home number and we have not heard from you through the emergency physician on call, we will assume that you have returned to your regular daily activities without incident.  SIGNATURES/CONFIDENTIALITY: You and/or your care partner have signed paperwork which will be entered into your electronic medical record.  These signatures attest to the fact that that the information above on your After Visit Summary has been reviewed and is understood.  Full responsibility of the confidentiality   of this discharge information lies with you and/or your care-partner.  Handout on polyps  

## 2013-05-23 NOTE — Progress Notes (Signed)
Called to room to assist during endoscopic procedure.  Patient ID and intended procedure confirmed with present staff. Received instructions for my participation in the procedure from the performing physician.  

## 2013-05-23 NOTE — Op Note (Signed)
[facility] [address] [place], [address]   COLONOSCOPY PROCEDURE REPORT  PATIENT: [name], [name]  MR#: [id number] BIRTHDATE: 04/01/1946 , 66  yrs. old GENDER: Male ENDOSCOPIST: Mardella Layman, MD, [facility] REFERRED OZ:[id number] Manus Gunning, [name] PROCEDURE DATE:  05/23/2013 PROCEDURE:   Colonoscopy with snare polypectomy First Screening Colonoscopy - Avg.  risk and is 50 yrs.  old or older Yes.  Prior Negative Screening - Now for repeat screening. N/A      Polyps Removed Today? Yes. ASA CLASS:   Class III INDICATIONS:average risk screening. MEDICATIONS: propofol (Diprivan) 100mg  IV .Marland Kitchensevere sleep apnea noted...  DESCRIPTION OF PROCEDURE:   After the risks benefits and alternatives of the procedure were thoroughly explained, informed consent was obtained.  A digital rectal exam revealed no abnormalities of the rectum.   The LB [id number]  endoscope was introduced through the anus and advanced to the cecum, which was identified by both the appendix and ileocecal valve. No adverse events experienced.   The quality of the prep was excellent, using MoviPrep  The instrument was then slowly withdrawn as the colon was fully examined.      COLON FINDINGS: A polypoid shaped sessile polyp ranging between 3-6mm in size was found in the rectum.  A polypectomy was performed with a cold snare.  The resection was complete and the polyp tissue was completely retrieved.   The colon was otherwise normal.  There was no diverticulosis, inflammation, polyps or cancers unless previously stated.  Retroflexed views revealed no abnormalities. The time to cecum=1 minutes 44 seconds.  Withdrawal time=6 minutes 00 seconds.  The scope was withdrawn and the procedure completed. COMPLICATIONS: There were no complications.  ENDOSCOPIC IMPRESSION: 1.   Sessile polyp ranging between 3-61mm in size was found in the rectum; polypectomy was performed with a cold snare 2.   The colon was  otherwise normal ...severe sleeps apnea...f/u exam in hospital setting needed !!!!  RECOMMENDATIONS: 1.  Repeat colonoscopy in 5 years if polyp adenomatous; otherwise 10 years 2.  Continue current medications   eSigned:  Mardella Layman, MD, [facility] 05/23/2013 10:21 AM   cc:

## 2013-05-23 NOTE — Progress Notes (Signed)
SEVERE SLEEP APNEA. ORAL AIRWAY USED.

## 2013-05-23 NOTE — Progress Notes (Signed)
Patient did not experience any of the following events: a burn prior to discharge; a fall within the facility; wrong site/side/patient/procedure/implant event; or a hospital transfer or hospital admission upon discharge from the facility. (G8907)Patient did not have preoperative order for IV antibiotic SSI prophylaxis. (G8918) ewm 

## 2013-05-24 ENCOUNTER — Encounter: Payer: Medicare Other | Attending: Family Medicine

## 2013-05-24 ENCOUNTER — Telehealth: Payer: Self-pay | Admitting: *Deleted

## 2013-05-24 VITALS — Ht 68.0 in | Wt 237.9 lb

## 2013-05-24 DIAGNOSIS — Z713 Dietary counseling and surveillance: Secondary | ICD-10-CM | POA: Insufficient documentation

## 2013-05-24 DIAGNOSIS — E119 Type 2 diabetes mellitus without complications: Secondary | ICD-10-CM | POA: Insufficient documentation

## 2013-05-24 NOTE — Telephone Encounter (Signed)
  Follow up Call-  Call back number 05/23/2013  Post procedure Call Back phone  # [phone]  Permission to leave phone message Yes     Patient questions:  Do you have a fever, pain , or abdominal swelling? no Pain Score  0 *  Have you tolerated food without any problems? yes  Have you been able to return to your normal activities? yes  Do you have any questions about your discharge instructions: Diet   no Medications  no Follow up visit  no  Do you have questions or concerns about your Care? no  Actions: * If pain score is 4 or above: No action needed, pain <4.

## 2013-05-28 ENCOUNTER — Encounter: Payer: Self-pay | Admitting: Gastroenterology

## 2013-05-30 DIAGNOSIS — E119 Type 2 diabetes mellitus without complications: Secondary | ICD-10-CM

## 2013-05-30 NOTE — Progress Notes (Signed)
Patient was seen on 05/30/13 for the second of a series of three diabetes self-management courses at the [facility]. The following learning objectives were met by the patient during this course:   Explain basic nutrition maintenance and quality assurance  Describe causes, symptoms and treatment of hypoglycemia and hyperglycemia  Explain how to manage diabetes during illness  Describe the importance of good nutrition for health and healthy eating strategies  List strategies to follow meal plan when dining out  Describe the effects of alcohol on glucose and how to use it safely  Describe problem solving skills for day-to-day glucose challenges  Describe strategies to use when treatment plan needs to change  Identify important factors involved in successful weight loss  Describe ways to remain physically active  Describe the impact of regular activity on insulin resistance  Identify current diabetes medications, their action on blood glucose, and [pssible side effects.  Handouts given in class:  Refrigerator magnet for Sick Day Guidelines  [facility] Oral medication/insulin handout  Your patient has identified their diabetes self-care support plan as:  [facility] support group   Follow-Up Plan: Patient will attend the final class of the ADA Diabetes Self-Care Education.  

## 2013-05-31 NOTE — Progress Notes (Signed)
Patient was seen on 05/24/13 for the first of a series of three diabetes self-management courses at the [facility].   Current HbA1c: 6.8%  The following learning objectives were met by the patient during this course:   Defines the role of glucose and insulin  Identifies type of diabetes and pathophysiology  Defines the diagnostic criteria for diabetes and prediabetes  States the risk factors for Type 2 Diabetes  States the symptoms of Type 2 Diabetes  Defines Type 2 Diabetes treatment goals  Defines Type 2 Diabetes treatment options  States the rationale for glucose monitoring  Identifies A1C, glucose targets, and testing times  Identifies proper sharps disposal  Defines the purpose of a diabetes food plan  Identifies carbohydrate food groups  Defines effects of carbohydrate foods on glucose levels  Identifies carbohydrate choices/grams/food labels  States benefits of physical activity and effect on glucose  Review of suggested activity guidelines  Handouts given during class include:  Type 2 Diabetes: Basics Book  My Food Plan Book  Food and Activity Log  Your patient has identified their diabetes self-care support plan as:  [facility] support group  Continued diabetes education  Follow-Up Plan: Attend core 2 and core 3

## 2013-05-31 NOTE — Patient Instructions (Signed)
Goals:  Follow Diabetes Meal Plan as instructed  Eat 3 meals and 2 snacks, every 3-5 hrs  Limit carbohydrate intake to 45 grams carbohydrate/meal  Limit carbohydrate intake to 0-30 grams carbohydrate/snack  Add lean protein foods to meals/snacks  Monitor glucose levels as instructed by your doctor  Aim for 15-30 mins of physical activity daily  Bring food record and glucose log to your next nutrition visit   

## 2013-06-08 ENCOUNTER — Ambulatory Visit: Payer: Medicare Other

## 2013-06-18 ENCOUNTER — Telehealth: Payer: Self-pay | Admitting: Oncology

## 2013-06-18 NOTE — Telephone Encounter (Signed)
Moved 9/12 appt to 9/15 w/[facility] due to MDS. appt moved per FS. S/w pt wife re change w/new d/t for 9/15 lb/fu @ 10:45am.

## 2013-06-19 ENCOUNTER — Ambulatory Visit (HOSPITAL_BASED_OUTPATIENT_CLINIC_OR_DEPARTMENT_OTHER): Payer: Medicare Other | Admitting: Lab

## 2013-06-19 ENCOUNTER — Telehealth: Payer: Self-pay | Admitting: Oncology

## 2013-06-19 DIAGNOSIS — C61 Malignant neoplasm of prostate: Secondary | ICD-10-CM

## 2013-06-19 LAB — CBC WITH DIFFERENTIAL/PLATELET
Eosinophils Absolute: 0.3 10*3/uL (ref 0.0–0.5)
MONO#: 0.7 10*3/uL (ref 0.1–0.9)
MONO%: 8.1 % (ref 0.0–14.0)
NEUT#: 5.1 10*3/uL (ref 1.5–6.5)
RBC: 4.03 10*6/uL — ABNORMAL LOW (ref 4.20–5.82)
RDW: 15.5 % — ABNORMAL HIGH (ref 11.0–14.6)
WBC: 8.1 10*3/uL (ref 4.0–10.3)

## 2013-06-19 LAB — COMPREHENSIVE METABOLIC PANEL (CC13)
ALT: 17 U/L (ref 0–55)
Albumin: 3.4 g/dL — ABNORMAL LOW (ref 3.5–5.0)
Alkaline Phosphatase: 64 U/L (ref 40–150)
CO2: 28 mEq/L (ref 22–29)
Glucose: 88 mg/dl (ref 70–140)
Potassium: 3.7 mEq/L (ref 3.5–5.1)
Sodium: 142 mEq/L (ref 136–145)
Total Protein: 7.2 g/dL (ref 6.4–8.3)

## 2013-06-19 NOTE — Telephone Encounter (Signed)
pt came in and wanted to move 9.15.14 lab to 9.9.14....done

## 2013-06-22 ENCOUNTER — Ambulatory Visit: Payer: Medicare Other | Admitting: Oncology

## 2013-06-22 ENCOUNTER — Other Ambulatory Visit: Payer: Medicare Other | Admitting: Lab

## 2013-06-25 ENCOUNTER — Telehealth: Payer: Self-pay | Admitting: *Deleted

## 2013-06-25 ENCOUNTER — Ambulatory Visit (HOSPITAL_BASED_OUTPATIENT_CLINIC_OR_DEPARTMENT_OTHER): Payer: Medicare Other | Admitting: Oncology

## 2013-06-25 ENCOUNTER — Other Ambulatory Visit: Payer: Medicare Other | Admitting: Lab

## 2013-06-25 VITALS — BP 118/73 | HR 61 | Temp 98.2°F | Resp 19 | Ht 68.0 in | Wt 237.1 lb

## 2013-06-25 DIAGNOSIS — Z5111 Encounter for antineoplastic chemotherapy: Secondary | ICD-10-CM

## 2013-06-25 DIAGNOSIS — I10 Essential (primary) hypertension: Secondary | ICD-10-CM

## 2013-06-25 DIAGNOSIS — C61 Malignant neoplasm of prostate: Secondary | ICD-10-CM

## 2013-06-25 DIAGNOSIS — E291 Testicular hypofunction: Secondary | ICD-10-CM

## 2013-06-25 MED ORDER — LEUPROLIDE ACETATE (4 MONTH) 30 MG IM KIT
30.0000 mg | PACK | Freq: Once | INTRAMUSCULAR | Status: AC
  Administered 2013-06-25: 30 mg via INTRAMUSCULAR
  Filled 2013-06-25: qty 30

## 2013-06-25 NOTE — Telephone Encounter (Signed)
appts made and printed...td 

## 2013-06-25 NOTE — Progress Notes (Signed)
Hematology and Oncology Follow Up Visit  [name] [id number] 11/28/1945 67 y.o. 06/25/2013 1:53 PM   [name]. [name], [name]  [name], [name]  Principle Diagnosis:This is a 67 year old gentleman with prostate cancer initially diagnosed in 2001.  He had a Gleason score of 3 + 3 = 6.  He has castration-resistant disease with pelvic adenopathy.    Prior Therapy:  1. Status post prostatectomy done in May 2001.  He had T3 N1 disease.  PSA nadir to 0.  2. The patient developed biochemical relapse and received prostatic bed radiation.  3. The patient received Lupron with Casodex due to rising PSA.  The patient subsequently developed castration-resistant disease. 4. Patient treated with Casodex withdrawal and subsequently PSA rose to 18. 5. Patient with Provenge immunotherapy completed in July 2011. 6.  He received ketoconazole and prednisone December 2011 through January 2014.  Current therapy: 1. He started Zytiga 1000 mg daily with Prednisone 5 mg daily on 11/03/12.  2. He is on Lupron 30 mg every 4 months. He is due for an injection in 06/2013.  Interim History: [name] presents today for a followup visit. He started on Zytiga in 1/2014without any significant toxicity. He did have a hospitalization between 3/11 and 3/19 after he presented with sepsis and acute renal failure. He did require a right nephrostomy tube for hydronephrosis. He was discharged after full recovery of his renal function and has been doing well. He is not reporting any nausea.  He is not reporting any vomiting.  Had not reported any abdominal pain or back pain.  He had not had any other GI toxicities at this time.  Overall, his performance status and activity levels are close to base line.  He continues to perform activities of daily living without any hindrance or decline.  He had not reported any back pain.  Had not reported any genitourinary complaints. No new complications from Zytiga.    Medications: I have reviewed the patient's current medications. Current Outpatient Prescriptions  Medication Sig Dispense Refill  . abiraterone Acetate (ZYTIGA) 250 MG tablet Take 4 tablets (1,000 mg total) by mouth daily.  120 tablet  1  . amLODipine (NORVASC) 10 MG tablet Take 10 mg by mouth every morning.       [name] amoxicillin (AMOXIL) 500 MG capsule Take 500 mg by mouth. Before dental appointment      . aspirin 81 MG tablet Take 81 mg by mouth daily.        . calcium carbonate (OS-CAL) 1250 MG chewable tablet Chew 2 tablets by mouth daily.      [name] leuprolide (LUPRON) 30 MG injection Inject 30 mg into the muscle every 4 (four) months.       [name] losartan-hydrochlorothiazide (HYZAAR) 100-12.5 MG per tablet Take 1 tablet by mouth every morning.       Marti Sleigh HCL PO Take 25 mg by mouth every 8 (eight) hours as needed.       . predniSONE (DELTASONE) 5 MG tablet TAKE 1 TABLET DAILY  180 tablet  0   No current facility-administered medications for this visit.    Allergies: No Known Allergies  Past Medical History, Surgical history, Social history, and Family History were reviewed and updated.  Review of Systems: Constitutional:  Negative for fever, chills, night sweats, anorexia, weight loss, pain. Cardiovascular: no chest pain or dyspnea on exertion Respiratory: no cough, shortness of breath, or wheezing Neurological: no TIA or stroke symptoms Dermatological: negative ENT: negative  Skin: Negative Gastrointestinal: no abdominal pain, change in bowel habits, or black or bloody stools Genito-Urinary: no dysuria, trouble voiding, or hematuria Hematological and Lymphatic: negative Musculoskeletal: negative Remaining ROS negative.  Physical Exam: Blood pressure 118/73, pulse 61, temperature 98.2 F (36.8 C), resp. rate 19, height 5\' 8"  (1.727 m), weight 237 lb 1.6 oz (107.548 kg), SpO2 97.00%. ECOG: 1 General appearance: alert Head: Normocephalic, without obvious abnormality,  atraumatic Neck: no adenopathy, no carotid bruit, no JVD, supple, symmetrical, trachea midline and thyroid not enlarged, symmetric, no tenderness/mass/nodules Lymph nodes: Cervical, supraclavicular, and axillary nodes normal. Heart:regular rate and rhythm, S1, S2 normal, no murmur, click, rub or gallop Lung:chest clear, no wheezing, rales, normal symmetric air entry, Heart exam - S1, S2 normal, no murmur, no gallop, rate regular Abdomen: soft, non-tender, without masses or organomegaly ZOX:WRUEA edema noted.  Lab Results: Lab Results  Component Value Date   WBC 8.1 06/19/2013   HGB 10.9* 06/19/2013   HCT 33.4* 06/19/2013   MCV 82.9 06/19/2013   PLT 216 06/19/2013     Chemistry      Component Value Date/Time   NA 142 06/19/2013 1042   NA 143 12/27/2012 0415   K 3.7 06/19/2013 1042   K 3.5 12/27/2012 0415   CL 108* 03/09/2013 0923   CL 108 12/27/2012 0415   CO2 28 06/19/2013 1042   CO2 28 12/27/2012 0415   BUN 16.8 06/19/2013 1042   BUN 17 12/27/2012 0415   CREATININE 1.0 06/19/2013 1042   CREATININE 0.91 12/27/2012 0415      Component Value Date/Time   CALCIUM 9.1 06/19/2013 1042   CALCIUM 7.9* 12/27/2012 0415   ALKPHOS 64 06/19/2013 1042   ALKPHOS 71 12/27/2012 0415   AST 16 06/19/2013 1042   AST 17 12/27/2012 0415   ALT 17 06/19/2013 1042   ALT 24 12/27/2012 0415   BILITOT 0.70 06/19/2013 1042   BILITOT 0.3 12/27/2012 0415      Results for [name], [name] (MRN [id number]) as of 06/25/2013 13:53  Ref. Range 02/02/2013 10:24 03/09/2013 09:23 04/11/2013 10:56 05/15/2013 10:09 06/19/2013 10:42  PSA Latest Range: <=4.00 ng/mL 142.20 (H) 175.60 (H) 190.80 (H) 176.70 (H) 190.50 (H)     Impression and Plan:  This is a 67 year old gentleman with the following issues: 1. Castration-resistant prostate cancer with pelvic adenopathy. He was started on Zytiga with Prednisone in January 2014. With PSA down to 159 in 11/2012. He was taken off his Zytiga during his hospitalization and PSA starting rising again. He has  restarted the Zytiga with his PSA dropping to 176 and up slightly today to 190.5. CT scan from 05/2013 showed overall slight progression of his lymph nodes. We will continue Zytiga for now. If he developed further progression, we can consider chemotherapy.  2. Actue renal failure due hydronephrosis: He is S/P right  nephrostomy tube on 3/14. Kidney function is back to normal.  3. Sepsis and thrombocytopenia: Unlikely related to [facility] or cancer. Resolved now.  4. Hormonal deprivation.  He continues to be on Lupron. Given today.  5. Hypertension. He is on Norvasc and Hyzaar per PCP.   6. Followup will be in 4 weeks.     [place], [place] 9/15/20141:53 PM

## 2013-06-26 ENCOUNTER — Encounter: Payer: Medicare Other | Attending: Family Medicine

## 2013-06-26 DIAGNOSIS — Z713 Dietary counseling and surveillance: Secondary | ICD-10-CM | POA: Insufficient documentation

## 2013-06-26 DIAGNOSIS — E119 Type 2 diabetes mellitus without complications: Secondary | ICD-10-CM

## 2013-06-27 NOTE — Progress Notes (Signed)
  Patient was seen on 06/26/13 for the third of a series of three diabetes self-management courses at the [facility]. The following learning objectives were met by the patient during this course:    Describe how diabetes changes over time   Identify diabetes complications and ways to prevent them   Describe strategies that can promote heart health including lowering blood pressure and cholesterol   Describe strategies to lower dietary fat and sodium in the diet   Identify physical activities that benefit cardiovascular health   Evaluate success in meeting personal goal   Describe the belief that they can live successfully with diabetes day to day   Establish 2-3 goals that they will plan to diligently work on until they return for the63-month follow-up visit  The following handouts were given in class:  Goal setting handout  Class evaluation form  Your patient has established the following 8month goals for diabetes self-care:  Reduce fat in my diet by eating less carbs at two or more meals a day  Be active 60 minutes or more 4 times a week  Test my glucose at least 2 times a day, 3 days a week   Follow-Up Plan: Patient will attend a 4 month follow-up visit for diabetes self-management education.

## 2013-07-16 ENCOUNTER — Other Ambulatory Visit: Payer: Self-pay | Admitting: Oncology

## 2013-07-20 ENCOUNTER — Other Ambulatory Visit: Payer: Self-pay | Admitting: Gastroenterology

## 2013-07-25 ENCOUNTER — Other Ambulatory Visit (HOSPITAL_BASED_OUTPATIENT_CLINIC_OR_DEPARTMENT_OTHER): Payer: Medicare Other | Admitting: Lab

## 2013-07-25 DIAGNOSIS — C61 Malignant neoplasm of prostate: Secondary | ICD-10-CM

## 2013-07-25 LAB — CBC WITH DIFFERENTIAL/PLATELET
BASO%: 0.8 % (ref 0.0–2.0)
EOS%: 3.8 % (ref 0.0–7.0)
HCT: 33.6 % — ABNORMAL LOW (ref 38.4–49.9)
LYMPH%: 27 % (ref 14.0–49.0)
MCH: 26.8 pg — ABNORMAL LOW (ref 27.2–33.4)
MCHC: 32.2 g/dL (ref 32.0–36.0)
NEUT#: 5.1 10*3/uL (ref 1.5–6.5)
NEUT%: 62.4 % (ref 39.0–75.0)
Platelets: 205 10*3/uL (ref 140–400)
RBC: 4.04 10*6/uL — ABNORMAL LOW (ref 4.20–5.82)

## 2013-07-25 LAB — COMPREHENSIVE METABOLIC PANEL (CC13)
ALT: 12 U/L (ref 0–55)
AST: 11 U/L (ref 5–34)
Albumin: 3.4 g/dL — ABNORMAL LOW (ref 3.5–5.0)
Alkaline Phosphatase: 76 U/L (ref 40–150)
Creatinine: 1.1 mg/dL (ref 0.7–1.3)
Total Bilirubin: 0.68 mg/dL (ref 0.20–1.20)
Total Protein: 7.2 g/dL (ref 6.4–8.3)

## 2013-07-27 ENCOUNTER — Ambulatory Visit (HOSPITAL_BASED_OUTPATIENT_CLINIC_OR_DEPARTMENT_OTHER): Payer: Medicare Other | Admitting: Oncology

## 2013-07-27 ENCOUNTER — Telehealth: Payer: Self-pay | Admitting: Oncology

## 2013-07-27 ENCOUNTER — Encounter: Payer: Self-pay | Admitting: Oncology

## 2013-07-27 VITALS — BP 119/77 | HR 60 | Temp 97.9°F | Resp 19 | Ht 68.0 in | Wt 237.0 lb

## 2013-07-27 DIAGNOSIS — I10 Essential (primary) hypertension: Secondary | ICD-10-CM

## 2013-07-27 DIAGNOSIS — C61 Malignant neoplasm of prostate: Secondary | ICD-10-CM

## 2013-07-27 MED ORDER — ENZALUTAMIDE 40 MG PO CAPS: 160.0000 mg | ORAL_CAPSULE | Freq: Every day | ORAL | Status: DC

## 2013-07-27 NOTE — Telephone Encounter (Signed)
Gave pt appt for lab and MD for November 2014 °

## 2013-07-27 NOTE — Progress Notes (Signed)
Faxed xtandi prescription to [facility] °

## 2013-07-27 NOTE — Progress Notes (Signed)
Hematology and Oncology Follow Up Visit  [name] [id number] [birth year] 67 y.o. 07/27/2013 10:57 AM   [name], [name]  [name], [name]  Principle Diagnosis:This is a 67 year old gentleman with prostate cancer initially diagnosed in 2001.  He had a Gleason score of 3 + 3 = 6.  He has castration-resistant disease with pelvic adenopathy.    Prior Therapy:  1. Status post prostatectomy done in May 2001.  He had T3 N1 disease.  PSA nadir to 0.  2. The patient developed biochemical relapse and received prostatic bed radiation.  3. The patient received Lupron with Casodex due to rising PSA.  The patient subsequently developed castration-resistant disease. 4. Patient treated with Casodex withdrawal and subsequently PSA rose to 18. 5. Patient with Provenge immunotherapy completed in July 2011. 6.  He received ketoconazole and prednisone December 2011 through January 2014.  Current therapy: 1. He started Zytiga 1000 mg daily with Prednisone 5 mg daily on 11/03/12.  2. He is on Lupron 30 mg every 4 months. He is due for an injection in 08/2013.  Interim History: [name] presents today for a followup visit. He started on Zytiga in 10/2012 without any significant toxicity. Since his last visit he has been doing very well. He is not reporting any nausea.  He is not reporting any vomiting.  Had not reported any abdominal pain or back pain.  He had not had any other GI toxicities at this time.  Overall, his performance status and activity levels are close to base line.  He continues to perform activities of daily living without any hindrance or decline.  He had not reported any back pain.  Had not reported any genitourinary complaints. No new complications from Zytiga. He is not reporting any urinary issues or bleeding. His continued to work close to full time for the time.  Medications: I have reviewed the patient's current medications. Current Outpatient Prescriptions   Medication Sig Dispense Refill  . amLODipine (NORVASC) 10 MG tablet Take 10 mg by mouth every morning.       [name] amoxicillin (AMOXIL) 500 MG capsule Take 500 mg by mouth. Before dental appointment      . aspirin 81 MG tablet Take 81 mg by mouth daily.        . calcium carbonate (OS-CAL) 1250 MG chewable tablet Chew 2 tablets by mouth daily.      . enzalutamide (XTANDI) 40 MG capsule Take 4 capsules (160 mg total) by mouth daily.  120 capsule  0  . leuprolide (LUPRON) 30 MG injection Inject 30 mg into the muscle every 4 (four) months.       [name] losartan-hydrochlorothiazide (HYZAAR) 100-12.5 MG per tablet Take 1 tablet by mouth every morning.       Marti Sleigh HCL PO Take 25 mg by mouth every 8 (eight) hours as needed.       . predniSONE (DELTASONE) 5 MG tablet TAKE 1 TABLET DAILY  180 tablet  0   No current facility-administered medications for this visit.    Allergies: No Known Allergies  Past Medical History, Surgical history, Social history, and Family History were reviewed and updated.  Review of Systems:  Remaining ROS negative.  Physical Exam: Blood pressure 119/77, pulse 60, temperature 97.9 F (36.6 C), temperature source Oral, resp. rate 19, height 5\' 8"  (1.727 m), weight 237 lb (107.502 kg), SpO2 98.00%. ECOG: 1 General appearance: alert Head: Normocephalic, without obvious abnormality, atraumatic Neck: no adenopathy, no carotid  bruit, no JVD, supple, symmetrical, trachea midline and thyroid not enlarged, symmetric, no tenderness/mass/nodules Lymph nodes: Cervical, supraclavicular, and axillary nodes normal. Heart:regular rate and rhythm, S1, S2 normal, no murmur, click, rub or gallop Lung:chest clear, no wheezing, rales, normal symmetric air entry, Heart exam - S1, S2 normal, no murmur, no gallop, rate regular Abdomen: soft, non-tender, without masses or organomegaly ZOX:WRUEA edema noted.  Lab Results: Lab Results  Component Value Date   WBC 8.2 07/25/2013   HGB 10.8*  07/25/2013   HCT 33.6* 07/25/2013   MCV 83.2 07/25/2013   PLT 205 07/25/2013     Chemistry      Component Value Date/Time   NA 143 07/25/2013 0859   NA 143 12/27/2012 0415   K 3.7 07/25/2013 0859   K 3.5 12/27/2012 0415   CL 108* 03/09/2013 0923   CL 108 12/27/2012 0415   CO2 24 07/25/2013 0859   CO2 28 12/27/2012 0415   BUN 18.6 07/25/2013 0859   BUN 17 12/27/2012 0415   CREATININE 1.1 07/25/2013 0859   CREATININE 0.91 12/27/2012 0415      Component Value Date/Time   CALCIUM 9.3 07/25/2013 0859   CALCIUM 7.9* 12/27/2012 0415   ALKPHOS 76 07/25/2013 0859   ALKPHOS 71 12/27/2012 0415   AST 11 07/25/2013 0859   AST 17 12/27/2012 0415   ALT 12 07/25/2013 0859   ALT 24 12/27/2012 0415   BILITOT 0.68 07/25/2013 0859   BILITOT 0.3 12/27/2012 0415      Results for [name], [name] (MRN [id number]) as of 07/27/2013 10:32  Ref. Range 06/19/2013 10:42 07/25/2013 08:59  PSA Latest Range: <=4.00 ng/mL 190.50 (H) 213.40 (H)      Impression and Plan:  This is a 67 year old gentleman with the following issues: 1. Castration-resistant prostate cancer with pelvic adenopathy. He was started on Zytiga with Prednisone in January 2014. With PSA down to 159 in 11/2012. He was taken off his Zytiga during his hospitalization and PSA starting rising again. He has restarted the Zytiga with his PSA dropping to 176 and up slightly today to 190.5. CT scan from 05/2013 showed overall slight progression of his lymph nodes. His last PSA showed continuous increase up to 213 with the noted progression on his CT scan in August of 2014. Options of treatments were discussed today including continue on Zytiga which I have discouraged, switching him to [facility] or systemic chemotherapy. Risks and benefits of all these approaches were discussed today and he is agreeable to go with Xtandi. Written information was given to the patient today and I anticipate starting him in the near future. 2. Actue renal failure due  hydronephrosis: He is S/P right  nephrostomy tube on 3/14. Kidney function is back to normal.  3. Sepsis and thrombocytopenia: Unlikely related to [facility] or cancer. Resolved now.  4. Hormonal deprivation.  He continues to be on Lupron. Given today.  5. Hypertension. He is on Norvasc and Hyzaar per PCP.   6. Followup will be in 4 weeks.     [name] 10/17/201410:57 AM

## 2013-07-27 NOTE — Progress Notes (Signed)
[employer], [id number], approved xtandi from 06/27/13-07/27/14.

## 2013-08-16 ENCOUNTER — Other Ambulatory Visit: Payer: Self-pay

## 2013-08-22 ENCOUNTER — Other Ambulatory Visit: Payer: Self-pay | Admitting: Oncology

## 2013-08-27 ENCOUNTER — Other Ambulatory Visit (HOSPITAL_BASED_OUTPATIENT_CLINIC_OR_DEPARTMENT_OTHER): Payer: Medicare Other | Admitting: Lab

## 2013-08-27 DIAGNOSIS — C61 Malignant neoplasm of prostate: Secondary | ICD-10-CM

## 2013-08-27 LAB — CBC WITH DIFFERENTIAL/PLATELET
Basophils Absolute: 0.1 10*3/uL (ref 0.0–0.1)
Eosinophils Absolute: 0.5 10*3/uL (ref 0.0–0.5)
HCT: 34.6 % — ABNORMAL LOW (ref 38.4–49.9)
HGB: 10.8 g/dL — ABNORMAL LOW (ref 13.0–17.1)
LYMPH%: 31.7 % (ref 14.0–49.0)
MCH: 25.8 pg — ABNORMAL LOW (ref 27.2–33.4)
MCV: 82.9 fL (ref 79.3–98.0)
MONO%: 9.5 % (ref 0.0–14.0)
NEUT#: 3.3 10*3/uL (ref 1.5–6.5)
NEUT%: 50.8 % (ref 39.0–75.0)
Platelets: 238 10*3/uL (ref 140–400)
RDW: 15.4 % — ABNORMAL HIGH (ref 11.0–14.6)

## 2013-08-27 LAB — COMPREHENSIVE METABOLIC PANEL (CC13)
AST: 15 U/L (ref 5–34)
Albumin: 3.8 g/dL (ref 3.5–5.0)
Alkaline Phosphatase: 56 U/L (ref 40–150)
Anion Gap: 9 mEq/L (ref 3–11)
BUN: 12.8 mg/dL (ref 7.0–26.0)
CO2: 27 mEq/L (ref 22–29)
Calcium: 9.5 mg/dL (ref 8.4–10.4)
Creatinine: 1 mg/dL (ref 0.7–1.3)
Glucose: 92 mg/dl (ref 70–140)
Potassium: 4 mEq/L (ref 3.5–5.1)
Sodium: 143 mEq/L (ref 136–145)
Total Protein: 7.3 g/dL (ref 6.4–8.3)

## 2013-08-27 LAB — PSA: PSA: 160.8 ng/mL — ABNORMAL HIGH (ref <=4.00)

## 2013-08-30 ENCOUNTER — Ambulatory Visit (HOSPITAL_BASED_OUTPATIENT_CLINIC_OR_DEPARTMENT_OTHER): Payer: Medicare Other | Admitting: Oncology

## 2013-08-30 ENCOUNTER — Telehealth: Payer: Self-pay | Admitting: Oncology

## 2013-08-30 VITALS — BP 132/70 | HR 59 | Temp 98.1°F | Resp 20 | Ht 68.0 in | Wt 235.9 lb

## 2013-08-30 DIAGNOSIS — R599 Enlarged lymph nodes, unspecified: Secondary | ICD-10-CM

## 2013-08-30 DIAGNOSIS — I10 Essential (primary) hypertension: Secondary | ICD-10-CM

## 2013-08-30 DIAGNOSIS — C61 Malignant neoplasm of prostate: Secondary | ICD-10-CM

## 2013-08-30 DIAGNOSIS — E291 Testicular hypofunction: Secondary | ICD-10-CM

## 2013-08-30 NOTE — Telephone Encounter (Signed)
GV AND PRINTED APPT SCHED AND AVS FOR PT FOR DEC   °

## 2013-08-30 NOTE — Progress Notes (Signed)
Hematology and Oncology Follow Up Visit  [name] [id number] 11/15/1945 67 y.o. 08/30/2013 9:46 AM   [name], [name]  [name], [name]  Principle Diagnosis:This is a 67 year old gentleman with prostate cancer initially diagnosed in 2001.  He had a Gleason score of 3 + 3 = 6.  He has castration-resistant disease with pelvic adenopathy.    Prior Therapy:  1. Status post prostatectomy done in May 2001.  He had T3 N1 disease.  PSA nadir to 0.  2. The patient developed biochemical relapse and received prostatic bed radiation.  3. The patient received Lupron with Casodex due to rising PSA.  The patient subsequently developed castration-resistant disease. 4. Patient treated with Casodex withdrawal and subsequently PSA rose to 18. 5. Patient with Provenge immunotherapy completed in July 2011. 6.  He received ketoconazole and prednisone December 2011 through January 2014. 7.         He is S/P Zytiga 1000 mg daily with Prednisone 5 mg daily on 11/03/12 till 07/2013. This was stopped due to progression of disease.   Current therapy: 1. He started on Xtandi 1000 mg daily 07/2013. 2. He is on Lupron 30 mg every 4 months. He is due for an injection in 10/2013.  Interim History: [name] presents today for a followup visit. He started on Xtandi in 07/2013 after he progressed on Zytiga without any significant toxicity. Since his last visit he has been doing very well. He is not reporting any nausea.  He is not reporting any vomiting.  Had not reported any abdominal pain or back pain.  He had not had any other GI toxicities at this time.  Overall, his performance status and activity levels are close to base line.  He continues to perform activities of daily living without any hindrance or decline.  He had not reported any back pain.  Had not reported any genitourinary complaints. No new complications from Zytiga. He is not reporting any urinary issues or bleeding. His continued  to work close to full time for the time.  Medications: I have reviewed the patient's current medications. Current Outpatient Prescriptions  Medication Sig Dispense Refill  . aspirin 81 MG tablet Take 81 mg by mouth daily.        . calcium carbonate (OS-CAL) 1250 MG chewable tablet Chew 2 tablets by mouth daily.      [name] leuprolide (LUPRON) 30 MG injection Inject 30 mg into the muscle every 4 (four) months.       [name] losartan-hydrochlorothiazide (HYZAAR) 100-12.5 MG per tablet Take 1 tablet by mouth every morning.       [name] 40 MG capsule TAKE 4 CAPSULES BY MOUTH DAILY  120 capsule  0   No current facility-administered medications for this visit.    Allergies: No Known Allergies  Past Medical History, Surgical history, Social history, and Family History were reviewed and updated.  Review of Systems:  Remaining ROS negative.  Physical Exam: Blood pressure 132/70, pulse 59, temperature 98.1 F (36.7 C), temperature source Oral, resp. rate 20, height 5\' 8"  (1.727 m), weight 235 lb 14.4 oz (107.004 kg). ECOG: 1 General appearance: alert Head: Normocephalic, without obvious abnormality, atraumatic Neck: no adenopathy, no carotid bruit, no JVD, supple, symmetrical, trachea midline and thyroid not enlarged, symmetric, no tenderness/mass/nodules Lymph nodes: Cervical, supraclavicular, and axillary nodes normal. Heart:regular rate and rhythm, S1, S2 normal, no murmur, click, rub or gallop Lung:chest clear, no wheezing, rales, normal symmetric air entry, Heart exam -  S1, S2 normal, no murmur, no gallop, rate regular Abdomen: soft, non-tender, without masses or organomegaly ZOX:WRUEA edema noted.  Lab Results: Lab Results  Component Value Date   WBC 6.5 08/27/2013   HGB 10.8* 08/27/2013   HCT 34.6* 08/27/2013   MCV 82.9 08/27/2013   PLT 238 08/27/2013     Chemistry      Component Value Date/Time   NA 143 08/27/2013 1024   NA 143 12/27/2012 0415   K 4.0 08/27/2013 1024   K 3.5  12/27/2012 0415   CL 108* 03/09/2013 0923   CL 108 12/27/2012 0415   CO2 27 08/27/2013 1024   CO2 28 12/27/2012 0415   BUN 12.8 08/27/2013 1024   BUN 17 12/27/2012 0415   CREATININE 1.0 08/27/2013 1024   CREATININE 0.91 12/27/2012 0415      Component Value Date/Time   CALCIUM 9.5 08/27/2013 1024   CALCIUM 7.9* 12/27/2012 0415   ALKPHOS 56 08/27/2013 1024   ALKPHOS 71 12/27/2012 0415   AST 15 08/27/2013 1024   AST 17 12/27/2012 0415   ALT 12 08/27/2013 1024   ALT 24 12/27/2012 0415   BILITOT 0.45 08/27/2013 1024   BILITOT 0.3 12/27/2012 0415      Results for [name], [name] (MRN [id number]) as of 08/30/2013 09:36  Ref. Range 07/25/2013 08:59 08/27/2013 10:24  PSA Latest Range: <=4.00 ng/mL 213.40 (H) 160.80 (H)       Impression and Plan:  This is a 67 year old gentleman with the following issues: 1. Castration-resistant prostate cancer with pelvic adenopathy. He was started on Xtandi in 07/2013. With PSA down to 160 after one month of therapy. He tolerated the drug very well and the plan is to continue on this current medication without any dose reduction or delay. 2. Actue renal failure due hydronephrosis: He is S/P right  nephrostomy tube on 3/14. Kidney function is back to normal.  3. Sepsis and thrombocytopenia: Unlikely related to [facility] or cancer. Resolved now.  4. Hormonal deprivation.  He continues to be on Lupron. Last time given on 06/25/2013. 5. Hypertension. He is on Norvasc and Hyzaar per PCP.   6. Followup will be in 4 to 5 weeks.     [name] 11/20/20149:46 AM

## 2013-09-21 ENCOUNTER — Other Ambulatory Visit: Payer: Self-pay | Admitting: Oncology

## 2013-10-01 ENCOUNTER — Other Ambulatory Visit: Payer: Self-pay | Admitting: Urology

## 2013-10-03 ENCOUNTER — Telehealth: Payer: Self-pay | Admitting: Medical Oncology

## 2013-10-03 ENCOUNTER — Telehealth: Payer: Self-pay | Admitting: *Deleted

## 2013-10-03 ENCOUNTER — Other Ambulatory Visit: Payer: Self-pay | Admitting: Oncology

## 2013-10-03 NOTE — Telephone Encounter (Signed)
Patient called to confirm receipt of message regarding prednisone. Denies further questions at this time. Patient knows to call office/clinic with any questions or concerns.

## 2013-10-03 NOTE — Telephone Encounter (Signed)
Called patient in reference to prednisone refill request.  Message left that medicine was d/c'd 08-30-2013 and is no longer apart of his treatment.  Offered choice to call if any questions.  Ketonazole/prednisone and Zitiga prednisone discontinued with current treatment of Xtandi and Lupron.  Message left on home [phone]) as requested by person who answered the call and mobile [phone]) numbers.

## 2013-10-08 ENCOUNTER — Other Ambulatory Visit (HOSPITAL_BASED_OUTPATIENT_CLINIC_OR_DEPARTMENT_OTHER): Payer: Medicare Other

## 2013-10-08 DIAGNOSIS — C61 Malignant neoplasm of prostate: Secondary | ICD-10-CM

## 2013-10-08 DIAGNOSIS — E291 Testicular hypofunction: Secondary | ICD-10-CM

## 2013-10-08 LAB — COMPREHENSIVE METABOLIC PANEL (CC13)
ALT: 11 U/L (ref 0–55)
AST: 12 U/L (ref 5–34)
Albumin: 3.8 g/dL (ref 3.5–5.0)
Anion Gap: 10 mEq/L (ref 3–11)
BUN: 14 mg/dL (ref 7.0–26.0)
CO2: 28 mEq/L (ref 22–29)
Chloride: 107 mEq/L (ref 98–109)
Creatinine: 0.9 mg/dL (ref 0.7–1.3)
Glucose: 91 mg/dl (ref 70–140)
Sodium: 145 mEq/L (ref 136–145)
Total Bilirubin: 0.31 mg/dL (ref 0.20–1.20)
Total Protein: 7.4 g/dL (ref 6.4–8.3)

## 2013-10-08 LAB — CBC WITH DIFFERENTIAL/PLATELET
BASO%: 1 % (ref 0.0–2.0)
EOS%: 5.6 % (ref 0.0–7.0)
Eosinophils Absolute: 0.3 10*3/uL (ref 0.0–0.5)
HGB: 12 g/dL — ABNORMAL LOW (ref 13.0–17.1)
MCH: 27 pg — ABNORMAL LOW (ref 27.2–33.4)
MCHC: 32.6 g/dL (ref 32.0–36.0)
MCV: 82.9 fL (ref 79.3–98.0)
MONO#: 0.5 10*3/uL (ref 0.1–0.9)
MONO%: 8.5 % (ref 0.0–14.0)
NEUT%: 52.4 % (ref 39.0–75.0)
RBC: 4.46 10*6/uL (ref 4.20–5.82)
WBC: 6.2 10*3/uL (ref 4.0–10.3)
lymph#: 2 10*3/uL (ref 0.9–3.3)

## 2013-10-09 ENCOUNTER — Telehealth: Payer: Self-pay | Admitting: *Deleted

## 2013-10-09 NOTE — Telephone Encounter (Signed)
Spoke with patient, he states he is not taking prednisone, so refill is not needed.

## 2013-10-10 ENCOUNTER — Ambulatory Visit (HOSPITAL_BASED_OUTPATIENT_CLINIC_OR_DEPARTMENT_OTHER): Payer: Medicare Other | Admitting: Oncology

## 2013-10-10 ENCOUNTER — Telehealth: Payer: Self-pay | Admitting: Oncology

## 2013-10-10 VITALS — BP 135/70 | HR 66 | Temp 98.2°F | Resp 20 | Ht 68.0 in | Wt 235.7 lb

## 2013-10-10 DIAGNOSIS — C61 Malignant neoplasm of prostate: Secondary | ICD-10-CM

## 2013-10-10 DIAGNOSIS — N1330 Unspecified hydronephrosis: Secondary | ICD-10-CM

## 2013-10-10 DIAGNOSIS — I10 Essential (primary) hypertension: Secondary | ICD-10-CM

## 2013-10-10 DIAGNOSIS — E291 Testicular hypofunction: Secondary | ICD-10-CM

## 2013-10-10 NOTE — Telephone Encounter (Signed)
gv and rinted aptp sched and avs for pt for Feb 2015

## 2013-10-10 NOTE — Progress Notes (Signed)
Mailed updated medication list to patient's home 

## 2013-10-10 NOTE — Progress Notes (Signed)
Hematology and Oncology Follow Up Visit  [name] [id number] 1945/12/06 67 y.o. 10/10/2013 10:33 AM   [name]. [name], [name]  [name]. [name], [name]  Principle Diagnosis:This is a 67 year old gentleman with prostate cancer initially diagnosed in 2001.  He had a Gleason score of 3 + 3 = 6.  He has castration-resistant disease with pelvic adenopathy.    Prior Therapy:  1. Status post prostatectomy done in May 2001.  He had T3 N1 disease.  PSA nadir to 0.  2. The patient developed biochemical relapse and received prostatic bed radiation.  3. The patient received Lupron with Casodex due to rising PSA.  The patient subsequently developed castration-resistant disease. 4. Patient treated with Casodex withdrawal and subsequently PSA rose to 18. 5. Patient with Provenge immunotherapy completed in July 2011. 6.  He received ketoconazole and prednisone December 2011 through January 2014. 7.         He is S/P Zytiga 1000 mg daily with Prednisone 5 mg daily on 11/03/12 till 07/2013. This was stopped due to progression of disease.   Current therapy: 1. He started on Xtandi 1000 mg daily 07/2013. 2. He is on Lupron 30 mg every 4 months. He is due for an injection in 11/2013.  Interim History: [name] presents today for a followup visit. He started on Xtandi in 07/2013 after he progressed on Zytiga without any significant toxicity. Since his last visit he has been doing very well. He is not reporting any nausea.  He is not reporting any vomiting.  Had not reported any abdominal pain or back pain.  He had not had any other GI toxicities at this time.  Overall, his performance status and activity levels are close to base line.  He continues to perform activities of daily living without any hindrance or decline.  He had not reported any back pain.  Had not reported any genitourinary complaints. No new complications from [place]. He is not reporting any urinary issues or bleeding. His continued  to work close to full time for the time. He has not reported any neurological complaints or decline in his quality of life.  Medications: I have reviewed the patient's current medications. Current Outpatient Prescriptions  Medication Sig Dispense Refill  . aspirin 81 MG tablet Take 81 mg by mouth daily.        . calcium carbonate (OS-CAL) 1250 MG chewable tablet Chew 2 tablets by mouth daily.      [name] leuprolide (LUPRON) 30 MG injection Inject 30 mg into the muscle every 4 (four) months.       [name] losartan-hydrochlorothiazide (HYZAAR) 100-12.5 MG per tablet Take 1 tablet by mouth every morning.       [name] 40 MG capsule TAKE 4 CAPSULES BY MOUTH DAILY  120 capsule  0   No current facility-administered medications for this visit.    Allergies: No Known Allergies  Past Medical History, Surgical history, Social history, and Family History were reviewed and updated.  Review of Systems:  Remaining ROS negative.  Physical Exam: Blood pressure 135/70, pulse 66, temperature 98.2 F (36.8 C), temperature source Oral, resp. rate 20, height 5\' 8"  (1.727 m), weight 235 lb 11.2 oz (106.913 kg). ECOG: 1 General appearance: alert Head: Normocephalic, without obvious abnormality, atraumatic Neck: no adenopathy, no carotid bruit, no JVD, supple, symmetrical, trachea midline and thyroid not enlarged, symmetric, no tenderness/mass/nodules Lymph nodes: Cervical, supraclavicular, and axillary nodes normal. Heart:regular rate and rhythm, S1, S2 normal, no murmur, click,  rub or gallop Lung:chest clear, no wheezing, rales, normal symmetric air entry, Heart exam - S1, S2 normal, no murmur, no gallop, rate regular Abdomen: soft, non-tender, without masses or organomegaly ZOX:WRUEA edema noted.  Lab Results: Lab Results  Component Value Date   WBC 6.2 10/08/2013   HGB 12.0* 10/08/2013   HCT 36.9* 10/08/2013   MCV 82.9 10/08/2013   PLT 213 10/08/2013     Chemistry      Component Value Date/Time   NA  145 10/08/2013 1002   NA 143 12/27/2012 0415   K 3.8 10/08/2013 1002   K 3.5 12/27/2012 0415   CL 108* 03/09/2013 0923   CL 108 12/27/2012 0415   CO2 28 10/08/2013 1002   CO2 28 12/27/2012 0415   BUN 14.0 10/08/2013 1002   BUN 17 12/27/2012 0415   CREATININE 0.9 10/08/2013 1002   CREATININE 0.91 12/27/2012 0415      Component Value Date/Time   CALCIUM 9.6 10/08/2013 1002   CALCIUM 7.9* 12/27/2012 0415   ALKPHOS 55 10/08/2013 1002   ALKPHOS 71 12/27/2012 0415   AST 12 10/08/2013 1002   AST 17 12/27/2012 0415   ALT 11 10/08/2013 1002   ALT 24 12/27/2012 0415   BILITOT 0.31 10/08/2013 1002   BILITOT 0.3 12/27/2012 0415        Results for AMARDEEP, [name] (MRN [id number]) as of 10/10/2013 10:24  Ref. Range 08/27/2013 10:24 10/08/2013 10:02  PSA Latest Range: <=4.00 ng/mL 160.80 (H) 201.30 (H)      Impression and Plan:  This is a 67 year old gentleman with the following issues: 1. Castration-resistant prostate cancer with pelvic adenopathy. He was started on Xtandi in 07/2013. With PSA down to 160 after one month of therapy now it's up to 201.30. He tolerated the drug very well and the plan is to continue on this current medication without any dose reduction or delay. If his PSA declines again, we will continue as planned. But if his PSA rises again we will consider switching to systemic chemotherapy. 2. Actue renal failure due hydronephrosis: He is S/P right  nephrostomy tube on 3/14. Kidney function is back to normal.  3. Sepsis and thrombocytopenia: Unlikely related to [facility] or cancer. Resolved now.  4. Hormonal deprivation.  He continues to be on Lupron. Last time given on 06/25/2013. 5. Hypertension. He is on Norvasc and Hyzaar per PCP.   6. Followup will be in 4 to 5 weeks.     SHADAD,FIRAS 12/31/201410:33 AM

## 2013-10-17 ENCOUNTER — Encounter (HOSPITAL_BASED_OUTPATIENT_CLINIC_OR_DEPARTMENT_OTHER): Payer: Self-pay | Admitting: *Deleted

## 2013-10-17 NOTE — Progress Notes (Signed)
NPO AFTER MN. ARRIVE AT 0600. NEEDS ISTAT. CURRENT EKG IN EPIC AND CHART.

## 2013-10-21 NOTE — Anesthesia Preprocedure Evaluation (Addendum)
Anesthesia Evaluation  Patient identified by MRN, date of birth, ID band Patient awake    Reviewed: Allergy & Precautions, H&P , NPO status , Patient's Chart, lab work & pertinent test results  Airway Mallampati: II TM Distance: >3 FB Neck ROM: Full    Dental  (+) Teeth Intact and Dental Advisory Given,    Pulmonary sleep apnea and Continuous Positive Airway Pressure Ventilation , PE breath sounds clear to auscultation  Pulmonary exam normal       Cardiovascular hypertension, Pt. on medications DVT Rhythm:Regular Rate:Normal     Neuro/Psych negative neurological ROS  negative psych ROS   GI/Hepatic negative GI ROS, Neg liver ROS,   Endo/Other  diabetes (diet control)  Renal/GU ARFRenal disease  negative genitourinary   Musculoskeletal negative musculoskeletal ROS (+)   Abdominal   Peds negative pediatric ROS (+)  Hematology negative hematology ROS (+)   Anesthesia Other Findings   Reproductive/Obstetrics negative OB ROS                          Anesthesia Physical Anesthesia Plan  ASA: III  Anesthesia Plan: General   Post-op Pain Management:    Induction: Intravenous  Airway Management Planned: LMA  Additional Equipment:   Intra-op Plan:   Post-operative Plan: Extubation in OR  Informed Consent: I have reviewed the patients History and Physical, chart, labs and discussed the procedure including the risks, benefits and alternatives for the proposed anesthesia with the patient or authorized representative who has indicated his/her understanding and acceptance.   Dental advisory given  Plan Discussed with: CRNA  Anesthesia Plan Comments:         Anesthesia Quick Evaluation

## 2013-10-22 ENCOUNTER — Ambulatory Visit (HOSPITAL_BASED_OUTPATIENT_CLINIC_OR_DEPARTMENT_OTHER)
Admission: RE | Admit: 2013-10-22 | Discharge: 2013-10-22 | Disposition: A | Discharge status: Home / Self Care | Payer: Medicare Other | Source: Ambulatory Visit | Attending: Urology | Admitting: Urology

## 2013-10-22 ENCOUNTER — Other Ambulatory Visit: Payer: Self-pay | Admitting: Oncology

## 2013-10-22 ENCOUNTER — Encounter (HOSPITAL_BASED_OUTPATIENT_CLINIC_OR_DEPARTMENT_OTHER)
Admission: RE | Disposition: A | Discharge status: Home / Self Care | Payer: Self-pay | Source: Ambulatory Visit | Attending: Urology

## 2013-10-22 ENCOUNTER — Encounter: Payer: Self-pay | Admitting: Oncology

## 2013-10-22 ENCOUNTER — Encounter (HOSPITAL_BASED_OUTPATIENT_CLINIC_OR_DEPARTMENT_OTHER): Payer: Medicare Other | Admitting: Anesthesiology

## 2013-10-22 ENCOUNTER — Ambulatory Visit (HOSPITAL_BASED_OUTPATIENT_CLINIC_OR_DEPARTMENT_OTHER): Payer: Medicare Other | Admitting: Anesthesiology

## 2013-10-22 ENCOUNTER — Encounter (HOSPITAL_BASED_OUTPATIENT_CLINIC_OR_DEPARTMENT_OTHER): Payer: Self-pay

## 2013-10-22 DIAGNOSIS — Z86711 Personal history of pulmonary embolism: Secondary | ICD-10-CM | POA: Insufficient documentation

## 2013-10-22 DIAGNOSIS — N1330 Unspecified hydronephrosis: Secondary | ICD-10-CM | POA: Insufficient documentation

## 2013-10-22 DIAGNOSIS — R599 Enlarged lymph nodes, unspecified: Secondary | ICD-10-CM | POA: Insufficient documentation

## 2013-10-22 DIAGNOSIS — E119 Type 2 diabetes mellitus without complications: Secondary | ICD-10-CM | POA: Insufficient documentation

## 2013-10-22 DIAGNOSIS — Z7982 Long term (current) use of aspirin: Secondary | ICD-10-CM | POA: Insufficient documentation

## 2013-10-22 DIAGNOSIS — G4733 Obstructive sleep apnea (adult) (pediatric): Secondary | ICD-10-CM | POA: Insufficient documentation

## 2013-10-22 DIAGNOSIS — C61 Malignant neoplasm of prostate: Secondary | ICD-10-CM

## 2013-10-22 DIAGNOSIS — Z79899 Other long term (current) drug therapy: Secondary | ICD-10-CM | POA: Insufficient documentation

## 2013-10-22 DIAGNOSIS — Z86718 Personal history of other venous thrombosis and embolism: Secondary | ICD-10-CM | POA: Insufficient documentation

## 2013-10-22 DIAGNOSIS — I10 Essential (primary) hypertension: Secondary | ICD-10-CM | POA: Insufficient documentation

## 2013-10-22 HISTORY — DX: Dependence on other enabling machines and devices: Z99.89

## 2013-10-22 HISTORY — DX: Personal history of pulmonary embolism: Z86.711

## 2013-10-22 HISTORY — DX: Obstructive sleep apnea (adult) (pediatric): G47.33

## 2013-10-22 HISTORY — DX: Personal history of other venous thrombosis and embolism: Z86.718

## 2013-10-22 HISTORY — DX: Unspecified hydronephrosis: N13.30

## 2013-10-22 HISTORY — DX: Localized enlarged lymph nodes: R59.0

## 2013-10-22 HISTORY — DX: Male erectile dysfunction, unspecified: N52.9

## 2013-10-22 HISTORY — DX: Personal history of other diseases of urinary system: Z87.448

## 2013-10-22 HISTORY — DX: Urgency of urination: R39.15

## 2013-10-22 HISTORY — PX: CYSTOSCOPY W/ URETERAL STENT PLACEMENT: SHX1429

## 2013-10-22 LAB — GLUCOSE, CAPILLARY: GLUCOSE-CAPILLARY: 107 mg/dL — AB (ref 70–99)

## 2013-10-22 LAB — POCT I-STAT 4, (NA,K, GLUC, HGB,HCT)
Glucose, Bld: 109 mg/dL — ABNORMAL HIGH (ref 70–99)
HCT: 40 % (ref 39.0–52.0)
HEMOGLOBIN: 13.6 g/dL (ref 13.0–17.0)
Potassium: 3.4 mEq/L — ABNORMAL LOW (ref 3.7–5.3)
SODIUM: 145 meq/L (ref 137–147)

## 2013-10-22 SURGERY — CYSTOSCOPY, FLEXIBLE, WITH STENT REPLACEMENT
Anesthesia: General | Site: Ureter | Laterality: Right

## 2013-10-22 MED ORDER — FENTANYL CITRATE 0.05 MG/ML IJ SOLN
INTRAMUSCULAR | Status: DC | PRN
  Administered 2013-10-22: 25 ug via INTRAVENOUS
  Administered 2013-10-22: 50 ug via INTRAVENOUS
  Administered 2013-10-22: 25 ug via INTRAVENOUS

## 2013-10-22 MED ORDER — MIDAZOLAM HCL 2 MG/2ML IJ SOLN
INTRAMUSCULAR | Status: AC
  Filled 2013-10-22: qty 2

## 2013-10-22 MED ORDER — BELLADONNA ALKALOIDS-OPIUM 16.2-60 MG RE SUPP
RECTAL | Status: AC
  Filled 2013-10-22: qty 1

## 2013-10-22 MED ORDER — LACTATED RINGERS IV SOLN
INTRAVENOUS | Status: DC
  Administered 2013-10-22: 06:00:00 via INTRAVENOUS
  Filled 2013-10-22: qty 1000

## 2013-10-22 MED ORDER — LACTATED RINGERS IV SOLN
INTRAVENOUS | Status: DC
Start: 2013-10-22 — End: 2013-10-22
  Filled 2013-10-22: qty 1000

## 2013-10-22 MED ORDER — ONDANSETRON HCL 4 MG/2ML IJ SOLN
INTRAMUSCULAR | Status: DC | PRN
  Administered 2013-10-22: 4 mg via INTRAVENOUS

## 2013-10-22 MED ORDER — PROPOFOL 10 MG/ML IV BOLUS
INTRAVENOUS | Status: DC | PRN
  Administered 2013-10-22: 200 mg via INTRAVENOUS

## 2013-10-22 MED ORDER — CEFAZOLIN SODIUM-DEXTROSE 2-3 GM-% IV SOLR
INTRAVENOUS | Status: DC | PRN
Start: 2013-10-22 — End: 2013-10-22
  Administered 2013-10-22: 2 g via INTRAVENOUS

## 2013-10-22 MED ORDER — SULFAMETHOXAZOLE-TMP DS 800-160 MG PO TABS: 1.0000 | ORAL_TABLET | Freq: Two times a day (BID) | ORAL | Status: DC

## 2013-10-22 MED ORDER — FENTANYL CITRATE 0.05 MG/ML IJ SOLN
INTRAMUSCULAR | Status: AC
  Filled 2013-10-22: qty 4

## 2013-10-22 MED ORDER — CEFAZOLIN SODIUM-DEXTROSE 2-3 GM-% IV SOLR
2.0000 g | INTRAVENOUS | Status: DC
  Filled 2013-10-22: qty 50

## 2013-10-22 MED ORDER — DEXAMETHASONE SODIUM PHOSPHATE 4 MG/ML IJ SOLN
INTRAMUSCULAR | Status: DC | PRN
  Administered 2013-10-22: 8 mg via INTRAVENOUS

## 2013-10-22 MED ORDER — PROMETHAZINE HCL 25 MG/ML IJ SOLN
6.2500 mg | INTRAMUSCULAR | Status: DC | PRN
  Filled 2013-10-22: qty 1

## 2013-10-22 MED ORDER — LIDOCAINE HCL (CARDIAC) 20 MG/ML IV SOLN
INTRAVENOUS | Status: DC | PRN
  Administered 2013-10-22: 60 mg via INTRAVENOUS
  Administered 2013-10-22: 40 mg via INTRAVENOUS

## 2013-10-22 MED ORDER — MIDAZOLAM HCL 5 MG/5ML IJ SOLN
INTRAMUSCULAR | Status: DC | PRN
  Administered 2013-10-22: 1 mg via INTRAVENOUS

## 2013-10-22 MED ORDER — FENTANYL CITRATE 0.05 MG/ML IJ SOLN
25.0000 ug | INTRAMUSCULAR | Status: DC | PRN
  Filled 2013-10-22: qty 1

## 2013-10-22 SURGICAL SUPPLY — 18 items
ADAPTER CATH URET PLST 4-6FR (CATHETERS) IMPLANT
BAG DRAIN URO-CYSTO SKYTR STRL (DRAIN) ×3 IMPLANT
CANISTER SUCT LVC 12 LTR MEDI- (MISCELLANEOUS) ×3 IMPLANT
CATH INTERMIT  6FR 70CM (CATHETERS) ×3 IMPLANT
CLOTH BEACON ORANGE TIMEOUT ST (SAFETY) ×3 IMPLANT
DRAPE CAMERA CLOSED 9X96 (DRAPES) ×3 IMPLANT
GLOVE BIO SURGEON STRL SZ8 (GLOVE) ×3 IMPLANT
GLOVE SURG SS PI 7.5 STRL IVOR (GLOVE) ×6 IMPLANT
GOWN PREVENTION PLUS LG XLONG (DISPOSABLE) IMPLANT
GOWN STRL REIN XL XLG (GOWN DISPOSABLE) IMPLANT
GOWN STRL REUS W/TWL LRG LVL3 (GOWN DISPOSABLE) ×3 IMPLANT
GOWN STRL REUS W/TWL XL LVL3 (GOWN DISPOSABLE) ×3 IMPLANT
GUIDEWIRE 0.038 PTFE COATED (WIRE) IMPLANT
GUIDEWIRE ANG ZIPWIRE 038X150 (WIRE) IMPLANT
GUIDEWIRE STR DUAL SENSOR (WIRE) ×3 IMPLANT
NS IRRIG 500ML POUR BTL (IV SOLUTION) IMPLANT
PACK CYSTOSCOPY (CUSTOM PROCEDURE TRAY) ×3 IMPLANT
STENT CONTOUR 8FR X 24 (STENTS) ×3 IMPLANT

## 2013-10-22 NOTE — Transfer of Care (Signed)
Immediate Anesthesia Transfer of Care Note  Patient: [name].  Procedure(s) Performed: Procedure(s) (LRB): CYSTOSCOPY WITH STENT REPLACEMENT (Right)  Patient Location: PACU  Anesthesia Type: General  Level of Consciousness: awake, alert  and oriented  Airway & Oxygen Therapy: Patient Spontanous Breathing and Patient connected to face mask oxygen  Post-op Assessment: Report given to PACU RN and Post -op Vital signs reviewed and stable  Post vital signs: Reviewed and stable  Complications: No apparent anesthesia complications

## 2013-10-22 NOTE — Progress Notes (Signed)
Called [facility] and was advised Zytiga was the drug they approved. Per notes he is no longer on Zytiga.  54650.35 is the amt he had left.

## 2013-10-22 NOTE — Anesthesia Procedure Notes (Signed)
Procedure Name: LMA Insertion Date/Time: 10/22/2013 7:30 AM Performed by: Mechele Claude Pre-anesthesia Checklist: Patient identified, Emergency Drugs available, Suction available and Patient being monitored Patient Re-evaluated:Patient Re-evaluated prior to inductionOxygen Delivery Method: Circle System Utilized Preoxygenation: Pre-oxygenation with 100% oxygen Intubation Type: IV induction Ventilation: Mask ventilation without difficulty LMA: LMA with gastric port inserted LMA Size: 5.0 Number of attempts: 1 Placement Confirmation: positive ETCO2 Tube secured with: Tape Dental Injury: Teeth and Oropharynx as per pre-operative assessment

## 2013-10-22 NOTE — Op Note (Signed)
Preoperative diagnosis: Right hydronephrosis  Postoperative diagnosis: Same   Procedure: Cystoscopy, right double-J stent exchange    Surgeon: [name]. Almira Phetteplace, [name]   Anesthesia: Gen.   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 He has right hydronephrosis which is being treated with an indwelling double-J stent. His current stent has been in close to 12 months, and he presents at this time for stent exchange. He is aware of the procedure, risks and complications and desires to proceed.    Technique and findings: The patient was properly identified in the holding area in his right side was marked appropriately. He was taken the operating room where, after IV antibiotics had been a ministered, he received general anesthetic for the LMA device. He was then placed in the dorsolithotomy position. Genitalia and perineum were prepped and draped, proper timeout was performed.  I then passed a 39 [place] panendoscope under direct vision through his urethra. There was a posterior false passage in the bulbous urethra. I navigated the scope into the bladder. Bladder was circumferentially inspected with both the 12 and the 70 lenses.  There was a stent placed in the right ureteral orifice. No other bladder abnormalities were noted. I then grasped the stent with the alligator forceps, and removed it through his urethra to the meatus. I then navigated a sensor-tip guidewire through the stent and, with the assistance of an open-ended catheter, navigated into the right renal pelvis. The open-ended catheter was then removed. Under direct vision, I then navigated the stent up through the right ureteral orifice using cystoscopic guidance. Fluoroscopy verified adequate position of the proximal end of the stent in the right renal pelvis. I  then removed the guidewire, good proximal and distal curls were seen using the fluoroscope and cystoscope. The bladder was drained and the scope removed. The patient tolerated procedure well. He was awakened and taken to the PACU in stable condition. You'll be sent home on 3 days of sulfa.

## 2013-10-22 NOTE — H&P (Signed)
Urology History and Physical Exam  CC: Right hydronephrosis history of prostate cancer  HPI: 68 year old male presents at this time for double-J stent exchange. He has no history of right hydronephrosis, which was initially treated last year with a percutaneous nephrostomy tube that was eventually internalized to a double-J stent. His last stent placement was in March. Recent renal ultrasound revealed adequate drainage of his right renal unit without hydronephrosis, but he presents at this time for stent change, as the stent has been in almost 10 months.  PMH: Past Medical History  Diagnosis Date  . Hypertension   . Osteoarthritis   . Retroperitoneal lymphadenopathy   . History of pulmonary embolus (PE)     dec 2009  . History of DVT (deep vein thrombosis)     dec 2009  . OSA on CPAP   . Neoplasm of uncertain behavior of kidney     LEFT RENAL UPPER POLE MASS--  MONTIORED BY UROLOGIST  DR Diona Fanti  . History of acute renal failure     W/ SEPSIS  MARCH 2014  . Hydronephrosis, right   . 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 Type 2 diabetes mellitus   . Urgency of urination   . ED (erectile dysfunction)     PSH: Past Surgical History  Procedure Laterality Date  . Total knee arthroplasty Bilateral RIGHT  04-24-2009/   LEFT  2005  . Insertion ivc filter and removal  03-25-2009/   REMOVAL 05-29-2009  . Prostatectomy  MAY 2001  . Transthoracic echocardiogram  12-27-2012    MILD LVH/  EF 60%/  GRADE I DIASTOLIC DYSFUNCTION  . Inguinal hernia repair Right 2010  . Penile prosthesis implant  2005    Allergies: No Known Allergies  Medications: Prescriptions prior to admission  Medication Sig Dispense Refill  . amLODipine (NORVASC) 5 MG tablet Take 5 mg by mouth every evening.       [name] aspirin 81 MG tablet Take 81 mg by mouth daily.        . calcium carbonate (OS-CAL) 1250 MG  chewable tablet Chew 2 tablets by mouth daily.      [name] losartan-hydrochlorothiazide (HYZAAR) 100-12.5 MG per tablet Take 1 tablet by mouth every morning.       Gillermina Phy 40 MG capsule TAKE 4 CAPSULES BY MOUTH DAILY  120 capsule  0  . leuprolide (LUPRON) 30 MG injection Inject 30 mg into the muscle every 4 (four) months.          Social History: History   Social History  . Marital Status: Married    Spouse Name: N/A    Number of Children: N/A  . Years of Education: N/A   Occupational History  . Not on file.   Social History Main Topics  . Smoking status: Never Smoker   . Smokeless tobacco: Never Used  . Alcohol Use: 3.6 oz/week    6 Cans of beer per week  . Drug Use: No  . Sexual Activity: Not on file   Other Topics Concern  . Not on file   Social History Narrative  . No narrative on file    Family History: Family History  Problem Relation Age of Onset  . Colon cancer Neg Hx   . Heart disease Mother     Review of Systems: Positive: N/A Negative:  A further 10 point review of systems was negative except  what is listed in the HPI.  Physical Exam: @VITALS2 @ General: No acute distress.  Awake. Head:  Normocephalic.  Atraumatic. ENT:  EOMI.  Mucous membranes moist Neck:  Supple.  No lymphadenopathy. CV:  S1 present. S2 present. Regular rate. Pulmonary: Equal effort bilaterally.  Clear to auscultation bilaterally. Abdomen: Soft.  Non tender to palpation. Skin:  Normal turgor.  No visible rash. Extremity: No gross deformity of bilateral upper extremities.  No gross deformity of                             lower extremities. Neurologic: Alert. Appropriate mood.   Studies:  Recent Labs     10/22/13  0623  HGB  13.6    Recent Labs     10/22/13  0623  NA  145  K  3.4*     No results found for this basename: PT, INR, APTT,  in the last 72 hours   No components found with this basename: ABG,     Assessment:  Right hydronephrosis  Plan: Cystoscopy,  right double-J stent exchange

## 2013-10-22 NOTE — Anesthesia Postprocedure Evaluation (Signed)
Anesthesia Post Note  Patient: [name].  Procedure(s) Performed: Procedure(s) (LRB): CYSTOSCOPY WITH STENT REPLACEMENT (Right)  Anesthesia type: General  Patient location: PACU  Post pain: Pain level controlled  Post assessment: Post-op Vital signs reviewed  Last Vitals:  Filed Vitals:   10/22/13 0905  BP: 141/84  Pulse: 63  Temp: 36.5 C  Resp: 18    Post vital signs: Reviewed  Level of consciousness: sedated  Complications: No apparent anesthesia complications

## 2013-10-22 NOTE — Discharge Instructions (Addendum)
1. You may see some blood in the urine and may have some burning with urination for 48-72 hours. You also may notice that you have to urinate more frequently or urgently after your procedure which is normal.  °2. You should call should you develop an inability urinate, fever > 101, persistent nausea and vomiting that prevents you from eating or drinking to stay hydrated.  °3. If you have a stent, you will likely urinate more frequently and urgently until the stent is removed and you may experience some discomfort/pain in the lower abdomen and flank especially when urinating. You may take pain medication prescribed to you if needed for pain. You may also intermittently have blood in the urine until the stent is removed. °4. If you have a catheter, you will be taught how to take care of the catheter by the nursing staff prior to discharge from the hospital.  You may periodically feel a strong urge to void with the catheter in place.  This is a bladder spasm and most often can occur when having a bowel movement or moving around. It is typically self-limited and usually will stop after a few minutes.  You may use some Vaseline or Neosporin around the tip of the catheter to reduce friction at the tip of the penis. You may also see some blood in the urine.  A very small amount of blood can make the urine look quite red.  As long as the catheter is draining well, there usually is not a problem.  However, if the catheter is not draining well and is bloody, you should call the office ([phone]) to notify us. ° ° °Post Anesthesia Home Care Instructions ° °Activity: °Get plenty of rest for the remainder of the day. A responsible adult should stay with you for 24 hours following the procedure.  °For the next 24 hours, DO NOT: °-Drive a car °-Operate machinery °-Drink alcoholic beverages °-Take any medication unless instructed by your physician °-Make any legal decisions or sign important papers. ° °Meals: °Start with liquid  foods such as gelatin or soup. Progress to regular foods as tolerated. Avoid greasy, spicy, heavy foods. If nausea and/or vomiting occur, drink only clear liquids until the nausea and/or vomiting subsides. Call your physician if vomiting continues. ° °Special Instructions/Symptoms: °Your throat may feel dry or sore from the anesthesia or the breathing tube placed in your throat during surgery. If this causes discomfort, gargle with warm salt water. The discomfort should disappear within 24 hours. ° ° °

## 2013-10-23 ENCOUNTER — Encounter (HOSPITAL_BASED_OUTPATIENT_CLINIC_OR_DEPARTMENT_OTHER): Payer: Self-pay | Admitting: Urology

## 2013-10-26 ENCOUNTER — Encounter: Payer: Medicare Other | Attending: Family Medicine | Admitting: *Deleted

## 2013-11-08 ENCOUNTER — Ambulatory Visit (HOSPITAL_BASED_OUTPATIENT_CLINIC_OR_DEPARTMENT_OTHER): Payer: Medicare Other

## 2013-11-08 DIAGNOSIS — C61 Malignant neoplasm of prostate: Secondary | ICD-10-CM

## 2013-11-08 LAB — COMPREHENSIVE METABOLIC PANEL (CC13)
ALK PHOS: 60 U/L (ref 40–150)
ALT: 13 U/L (ref 0–55)
AST: 14 U/L (ref 5–34)
Albumin: 3.7 g/dL (ref 3.5–5.0)
Anion Gap: 11 mEq/L (ref 3–11)
BILIRUBIN TOTAL: 0.3 mg/dL (ref 0.20–1.20)
BUN: 15.4 mg/dL (ref 7.0–26.0)
CO2: 27 mEq/L (ref 22–29)
Calcium: 9.4 mg/dL (ref 8.4–10.4)
Chloride: 108 mEq/L (ref 98–109)
Creatinine: 0.9 mg/dL (ref 0.7–1.3)
Glucose: 115 mg/dl (ref 70–140)
Potassium: 3.9 mEq/L (ref 3.5–5.1)
SODIUM: 146 meq/L — AB (ref 136–145)
TOTAL PROTEIN: 6.9 g/dL (ref 6.4–8.3)

## 2013-11-08 LAB — CBC WITH DIFFERENTIAL/PLATELET
BASO%: 1.3 % (ref 0.0–2.0)
Basophils Absolute: 0.1 10*3/uL (ref 0.0–0.1)
EOS%: 12.3 % — ABNORMAL HIGH (ref 0.0–7.0)
Eosinophils Absolute: 0.7 10*3/uL — ABNORMAL HIGH (ref 0.0–0.5)
HCT: 34.7 % — ABNORMAL LOW (ref 38.4–49.9)
HGB: 11.3 g/dL — ABNORMAL LOW (ref 13.0–17.1)
LYMPH%: 26.3 % (ref 14.0–49.0)
MCH: 26.9 pg — ABNORMAL LOW (ref 27.2–33.4)
MCHC: 32.5 g/dL (ref 32.0–36.0)
MCV: 82.9 fL (ref 79.3–98.0)
MONO#: 0.4 10*3/uL (ref 0.1–0.9)
MONO%: 6.9 % (ref 0.0–14.0)
NEUT#: 3.1 10*3/uL (ref 1.5–6.5)
NEUT%: 53.2 % (ref 39.0–75.0)
Platelets: 205 10*3/uL (ref 140–400)
RBC: 4.19 10*6/uL — AB (ref 4.20–5.82)
RDW: 16 % — ABNORMAL HIGH (ref 11.0–14.6)
WBC: 5.8 10*3/uL (ref 4.0–10.3)
lymph#: 1.5 10*3/uL (ref 0.9–3.3)

## 2013-11-08 LAB — PSA: PSA: 247.8 ng/mL — ABNORMAL HIGH (ref <=4.00)

## 2013-11-12 ENCOUNTER — Other Ambulatory Visit: Payer: Medicare Other

## 2013-11-14 ENCOUNTER — Ambulatory Visit (HOSPITAL_BASED_OUTPATIENT_CLINIC_OR_DEPARTMENT_OTHER): Payer: Medicare Other | Admitting: Oncology

## 2013-11-14 ENCOUNTER — Telehealth: Payer: Self-pay | Admitting: Oncology

## 2013-11-14 VITALS — BP 139/73 | HR 65 | Temp 97.7°F | Resp 18 | Ht 68.0 in | Wt 237.9 lb

## 2013-11-14 DIAGNOSIS — C61 Malignant neoplasm of prostate: Secondary | ICD-10-CM

## 2013-11-14 DIAGNOSIS — N179 Acute kidney failure, unspecified: Secondary | ICD-10-CM

## 2013-11-14 DIAGNOSIS — E291 Testicular hypofunction: Secondary | ICD-10-CM

## 2013-11-14 DIAGNOSIS — N1330 Unspecified hydronephrosis: Secondary | ICD-10-CM

## 2013-11-14 DIAGNOSIS — I10 Essential (primary) hypertension: Secondary | ICD-10-CM

## 2013-11-14 DIAGNOSIS — Z5111 Encounter for antineoplastic chemotherapy: Secondary | ICD-10-CM

## 2013-11-14 MED ORDER — LEUPROLIDE ACETATE (4 MONTH) 30 MG IM KIT
30.0000 mg | PACK | Freq: Once | INTRAMUSCULAR | Status: AC
  Administered 2013-11-14: 30 mg via INTRAMUSCULAR
  Filled 2013-11-14: qty 30

## 2013-11-14 NOTE — Progress Notes (Signed)
Hematology and Oncology Follow Up Visit  [name] [id number] 1945/10/15 68 y.o. 11/14/2013 4:10 PM   [name], [name]  [name], [name]  Principle Diagnosis:This is a 68 year old gentleman with prostate cancer initially diagnosed in 2001.  He had a Gleason score of 3 + 3 = 6.  He has castration-resistant disease with pelvic adenopathy.    Prior Therapy:  1. Status post prostatectomy done in May 2001.  He had T3 N1 disease.  PSA nadir to 0.  2. The patient developed biochemical relapse and received prostatic bed radiation.  3. The patient received Lupron with Casodex due to rising PSA.  The patient subsequently developed castration-resistant disease. 4. Patient treated with Casodex withdrawal and subsequently PSA rose to 18. 5. Patient with Provenge immunotherapy completed in July 2011. 6.  He received ketoconazole and prednisone December 2011 through January 2014. 7.         He is S/P Zytiga 1000 mg daily with Prednisone 5 mg daily on 11/03/12 till 07/2013. This was stopped due to progression of disease.   Current therapy: 1. He started on Xtandi 1000 mg daily 07/2013. 2. He is on Lupron 30 mg every 4 months. He is due for an injection in 11/2013 and will be repeated again in 03/2014.   Interim History: [name] presents today for a followup visit. He started on Xtandi in 07/2013 with out any significant toxicity. Since his last visit he has been doing very well. He underwent cystoscopy on right double J stent exchange under the care of Dr. Luberta [name] on 10/22/2013. He tolerated the procedure well without any complications. He is not reporting any nausea.  He is not reporting any vomiting.  Had not reported any abdominal pain or back pain.  He had not had any other GI toxicities at this time.  Overall, his performance status and activity levels are close to base line.  He continues to perform activities of daily living without any hindrance or decline.  He had not  reported any back pain.  Had not reported any genitourinary complaints. No new complications from [name]. He is not reporting any urinary issues or bleeding. His continued to work close to full time for the time. He has not reported any neurological complaints or decline in his quality of life.  Medications: I have reviewed the patient's current medications. Current Outpatient Prescriptions  Medication Sig Dispense Refill  . amLODipine (NORVASC) 5 MG tablet Take 5 mg by mouth every evening.       [name] aspirin 81 MG tablet Take 81 mg by mouth daily.        . calcium carbonate (OS-CAL) 1250 MG chewable tablet Chew 2 tablets by mouth daily.      [name] leuprolide (LUPRON) 30 MG injection Inject 30 mg into the muscle every 4 (four) months.       [name] losartan-hydrochlorothiazide (HYZAAR) 100-12.5 MG per tablet Take 1 tablet by mouth every morning.       . sulfamethoxazole-trimethoprim (BACTRIM DS) 800-160 MG per tablet Take 1 tablet by mouth 2 (two) times daily.      [name] 40 MG capsule TAKE 4 CAPSULES BY MOUTH DAILY  120 capsule  0   Current Facility-Administered Medications  Medication Dose Route Frequency Provider Last Rate Last Dose  . leuprolide (LUPRON) injection 30 mg  30 mg Intramuscular Once [name] Shape, NP        Allergies: No Known Allergies  Past Medical History, Surgical history,  Social history, and Family History were reviewed and updated.  Review of Systems:  Remaining ROS negative.  Physical Exam: Blood pressure 139/73, pulse 65, temperature 97.7 F (36.5 C), temperature source Oral, resp. rate 18, height 5\' 8"  (1.727 m), weight 237 lb 14.4 oz (107.911 kg). ECOG: 1 General appearance: alert Head: Normocephalic, without obvious abnormality, atraumatic Neck: no adenopathy, no carotid bruit, no JVD, supple, symmetrical, trachea midline and thyroid not enlarged, symmetric, no tenderness/mass/nodules Lymph nodes: Cervical, supraclavicular, and axillary nodes  normal. Heart:regular rate and rhythm, S1, S2 normal, no murmur, click, rub or gallop Lung:chest clear, no wheezing, rales, normal symmetric air entry, Heart exam - S1, S2 normal, no murmur, no gallop, rate regular Abdomen: soft, non-tender, without masses or organomegaly DJS:HFWYO edema noted.  Lab Results: Lab Results  Component Value Date   WBC 5.8 11/08/2013   HGB 11.3* 11/08/2013   HCT 34.7* 11/08/2013   MCV 82.9 11/08/2013   PLT 205 11/08/2013     Chemistry      Component Value Date/Time   NA 146* 11/08/2013 1107   NA 145 10/22/2013 0623   K 3.9 11/08/2013 1107   K 3.4* 10/22/2013 0623   CL 108* 03/09/2013 0923   CL 108 12/27/2012 0415   CO2 27 11/08/2013 1107   CO2 28 12/27/2012 0415   BUN 15.4 11/08/2013 1107   BUN 17 12/27/2012 0415   CREATININE 0.9 11/08/2013 1107   CREATININE 0.91 12/27/2012 0415      Component Value Date/Time   CALCIUM 9.4 11/08/2013 1107   CALCIUM 7.9* 12/27/2012 0415   ALKPHOS 60 11/08/2013 1107   ALKPHOS 71 12/27/2012 0415   AST 14 11/08/2013 1107   AST 17 12/27/2012 0415   ALT 13 11/08/2013 1107   ALT 24 12/27/2012 0415   BILITOT 0.30 11/08/2013 1107   BILITOT 0.3 12/27/2012 0415      Results for [name] (MRN [id number]) as of 11/14/2013 15:54  Ref. Range 10/08/2013 10:02 11/08/2013 11:07  PSA Latest Range: <=4.00 ng/mL 201.30 (H) 247.80 (H)         Impression and Plan:  This is a 68 year old gentleman with the following issues: 1. Castration-resistant prostate cancer with pelvic adenopathy. He was started on Xtandi in 07/2013. With PSA down to 160 after one month of therapy now it's up to 247.30. I have discussed this recent rise in his PSA explained to him that he certainly could have developed progression of disease he could also be affected with his recent operation and cystoscopy which can cause a rise in the PSA. Certainly he is at high risk of progression in the options of treatments discussed including salvage chemotherapy which will be  next if his next PSA continued to rise. He is agreeable to try one more month and we'll assess it at that time. 2. Actue renal failure due hydronephrosis: He is S/P right  nephrostomy tube on 3/14 and a recent exchange on 10/22/2013. 3. Sepsis and thrombocytopenia: Unlikely related to Zytiga or cancer. Resolved now.  4. Hormonal deprivation.  He continues to be on Lupron. He will receive Lupron today repeated in June of 2015. 5. Hypertension. He is on Norvasc and Hyzaar per PCP.   6. Followup will be in 4 to 5 weeks.     DXAJOI,NOMVE 2/4/20154:10 PM

## 2013-11-14 NOTE — Telephone Encounter (Signed)
gv adn printed appt sched and avs for pt for March.... °

## 2013-11-20 ENCOUNTER — Other Ambulatory Visit: Payer: Self-pay | Admitting: Oncology

## 2013-12-12 ENCOUNTER — Other Ambulatory Visit (HOSPITAL_BASED_OUTPATIENT_CLINIC_OR_DEPARTMENT_OTHER): Payer: Medicare Other

## 2013-12-12 DIAGNOSIS — I10 Essential (primary) hypertension: Secondary | ICD-10-CM

## 2013-12-12 DIAGNOSIS — C61 Malignant neoplasm of prostate: Secondary | ICD-10-CM

## 2013-12-12 LAB — COMPREHENSIVE METABOLIC PANEL (CC13)
ALK PHOS: 58 U/L (ref 40–150)
ALT: 13 U/L (ref 0–55)
AST: 12 U/L (ref 5–34)
Albumin: 3.6 g/dL (ref 3.5–5.0)
Anion Gap: 12 mEq/L — ABNORMAL HIGH (ref 3–11)
BUN: 15.2 mg/dL (ref 7.0–26.0)
CALCIUM: 9.4 mg/dL (ref 8.4–10.4)
CHLORIDE: 107 meq/L (ref 98–109)
CO2: 26 mEq/L (ref 22–29)
CREATININE: 1 mg/dL (ref 0.7–1.3)
Glucose: 137 mg/dl (ref 70–140)
Potassium: 3.5 mEq/L (ref 3.5–5.1)
Sodium: 145 mEq/L (ref 136–145)
Total Bilirubin: 0.31 mg/dL (ref 0.20–1.20)
Total Protein: 6.8 g/dL (ref 6.4–8.3)

## 2013-12-12 LAB — CBC WITH DIFFERENTIAL/PLATELET
BASO%: 1.2 % (ref 0.0–2.0)
BASOS ABS: 0.1 10*3/uL (ref 0.0–0.1)
EOS%: 10 % — ABNORMAL HIGH (ref 0.0–7.0)
Eosinophils Absolute: 0.5 10*3/uL (ref 0.0–0.5)
HCT: 34.1 % — ABNORMAL LOW (ref 38.4–49.9)
HEMOGLOBIN: 11 g/dL — AB (ref 13.0–17.1)
LYMPH%: 27.9 % (ref 14.0–49.0)
MCH: 26.6 pg — ABNORMAL LOW (ref 27.2–33.4)
MCHC: 32.1 g/dL (ref 32.0–36.0)
MCV: 82.8 fL (ref 79.3–98.0)
MONO#: 0.4 10*3/uL (ref 0.1–0.9)
MONO%: 7.7 % (ref 0.0–14.0)
NEUT#: 2.7 10*3/uL (ref 1.5–6.5)
NEUT%: 53.2 % (ref 39.0–75.0)
PLATELETS: 216 10*3/uL (ref 140–400)
RBC: 4.12 10*6/uL — ABNORMAL LOW (ref 4.20–5.82)
RDW: 17.7 % — ABNORMAL HIGH (ref 11.0–14.6)
WBC: 5 10*3/uL (ref 4.0–10.3)
lymph#: 1.4 10*3/uL (ref 0.9–3.3)

## 2013-12-13 LAB — PSA: PSA: 274.5 ng/mL — ABNORMAL HIGH (ref <=4.00)

## 2013-12-19 ENCOUNTER — Ambulatory Visit (HOSPITAL_BASED_OUTPATIENT_CLINIC_OR_DEPARTMENT_OTHER): Payer: Medicare Other | Admitting: Oncology

## 2013-12-19 ENCOUNTER — Other Ambulatory Visit: Payer: Self-pay | Admitting: Oncology

## 2013-12-19 ENCOUNTER — Telehealth: Payer: Self-pay | Admitting: Oncology

## 2013-12-19 ENCOUNTER — Encounter: Payer: Self-pay | Admitting: Oncology

## 2013-12-19 VITALS — BP 136/66 | HR 66 | Temp 98.8°F | Resp 18 | Ht 68.0 in | Wt 239.2 lb

## 2013-12-19 DIAGNOSIS — I10 Essential (primary) hypertension: Secondary | ICD-10-CM

## 2013-12-19 DIAGNOSIS — C61 Malignant neoplasm of prostate: Secondary | ICD-10-CM

## 2013-12-19 DIAGNOSIS — E291 Testicular hypofunction: Secondary | ICD-10-CM

## 2013-12-19 NOTE — Telephone Encounter (Signed)
gv and printed appt sched adna vs for April...gv pt barium

## 2013-12-19 NOTE — Progress Notes (Signed)
Hematology and Oncology Follow Up Visit  [name] [id number] [birth year] 68 y.o. 12/19/2013 9:09 AM   [name], [name]  [name] [name], [name]  Principle Diagnosis:This is a 68 year old gentleman with prostate cancer initially diagnosed in 2001.  He had a Gleason score of 3 + 3 = 6.  He has castration-resistant disease with pelvic adenopathy.    Prior Therapy:  1. Status post prostatectomy done in May 2001.  He had T3 N1 disease.  PSA nadir to 0.  2. The patient developed biochemical relapse and received prostatic bed radiation.  3. The patient received Lupron with Casodex due to rising PSA.  The patient subsequently developed castration-resistant disease. 4. Patient treated with Casodex withdrawal and subsequently PSA rose to 18. 5. Patient with Provenge immunotherapy completed in July 2011. 6.  He received ketoconazole and prednisone December 2011 through January 2014. 7.         He is S/P Zytiga 1000 mg daily with Prednisone 5 mg daily on 11/03/12 till 07/2013. This was stopped due to progression of disease.   Current therapy: 1. He started on Xtandi 1000 mg daily 07/2013. 2. He is on Lupron 30 mg every 4 months. He is due for an injection in 11/2013 and will be repeated again in 03/2014.   Interim History: [name] presents today for a followup visit. He started on Xtandi in 07/2013 with out any significant toxicity. Since his last visit he has been doing very well. Since his last visit, he has no new complaints. He is not reporting any nausea.  He is not reporting any vomiting.  Had not reported any abdominal pain or back pain.  He had not had any other GI toxicities at this time.  Overall, his performance status and activity levels are close to base line.  He continues to perform activities of daily living without any hindrance or decline.  He had not reported any back pain.  Had not reported any genitourinary complaints. No new complications from [place]. He is not  reporting any urinary issues or bleeding. His continued to work close to full time for the time. He has not reported any neurological complaints or decline in his quality of life. He has not reported any seizures or decline in his exercise tolerance. He has not reported any lymphadenopathy or pruritus. He has not reported any petechiae or bleeding.  Medications: I have reviewed the patient's current medications. Current Outpatient Prescriptions  Medication Sig Dispense Refill  . amLODipine (NORVASC) 5 MG tablet Take 5 mg by mouth every evening.       [name] aspirin 81 MG tablet Take 81 mg by mouth daily.        . calcium carbonate (OS-CAL) 1250 MG chewable tablet Chew 2 tablets by mouth daily.      [name] leuprolide (LUPRON) 30 MG injection Inject 30 mg into the muscle every 4 (four) months.       [name] losartan-hydrochlorothiazide (HYZAAR) 100-12.5 MG per tablet Take 1 tablet by mouth every morning.       [name] 40 MG capsule TAKE 4 CAPSULES BY MOUTH DAILY  120 capsule  0   No current facility-administered medications for this visit.    Allergies: No Known Allergies  Past Medical History, Surgical history, Social history, and Family History were reviewed and updated.  Review of Systems:  Remaining ROS negative.  Physical Exam: Blood pressure 136/66, pulse 66, temperature 98.8 F (37.1 C), temperature source Oral, resp. rate 18,  height 5\' 8"  (1.727 m), weight 239 lb 3.2 oz (108.5 kg), SpO2 99.00%. ECOG: 1 General appearance: alert Head: Normocephalic, without obvious abnormality, atraumatic Neck: no adenopathy, no carotid bruit, no JVD, supple, symmetrical, trachea midline and thyroid not enlarged, symmetric, no tenderness/mass/nodules Lymph nodes: Cervical, supraclavicular, and axillary nodes normal. Heart:regular rate and rhythm, S1, S2 normal, no murmur, click, rub or gallop Lung:chest clear, no wheezing, rales, normal symmetric air entry, Heart exam - S1, S2 normal, no murmur, no gallop, rate  regular Abdomen: soft, non-tender, without masses or organomegaly WUJ:WJXBJ edema noted.  Lab Results: Lab Results  Component Value Date   WBC 5.0 12/12/2013   HGB 11.0* 12/12/2013   HCT 34.1* 12/12/2013   MCV 82.8 12/12/2013   PLT 216 12/12/2013     Chemistry      Component Value Date/Time   NA 145 12/12/2013 0919   NA 145 10/22/2013 0623   K 3.5 12/12/2013 0919   K 3.4* 10/22/2013 0623   CL 108* 03/09/2013 0923   CL 108 12/27/2012 0415   CO2 26 12/12/2013 0919   CO2 28 12/27/2012 0415   BUN 15.2 12/12/2013 0919   BUN 17 12/27/2012 0415   CREATININE 1.0 12/12/2013 0919   CREATININE 0.91 12/27/2012 0415      Component Value Date/Time   CALCIUM 9.4 12/12/2013 0919   CALCIUM 7.9* 12/27/2012 0415   ALKPHOS 58 12/12/2013 0919   ALKPHOS 71 12/27/2012 0415   AST 12 12/12/2013 0919   AST 17 12/27/2012 0415   ALT 13 12/12/2013 0919   ALT 24 12/27/2012 0415   BILITOT 0.31 12/12/2013 0919   BILITOT 0.3 12/27/2012 0415        Results for [name] (MRN [id number]) as of 12/19/2013 09:07  Ref. Range 11/08/2013 11:07 12/12/2013 09:19  PSA Latest Range: <=4.00 ng/mL 247.80 (H) 274.50 (H)        Impression and Plan:  This is a 68 year old gentleman with the following issues: 1. Castration-resistant prostate cancer with pelvic adenopathy. He was started on Xtandi in 07/2013. With PSA down to 160 after one month of therapy now it's up to 274. The plan at this point is to restage him with a CT scan of the chest abdomen and pelvis as well as a repeat PSA in one month. If he is showing signs of progression of disease by both PSA criteria as well as CT scan, I will switch him to systemic chemotherapy at this time. 2. Actue renal failure due hydronephrosis: He is S/P right  nephrostomy tube on 3/14 and a recent exchange on 10/22/2013. 3. Sepsis and thrombocytopenia: Unlikely related to Zytiga or cancer. Resolved now.  4. Hormonal deprivation.  He continues to be on Lupron. He will receive Lupron today repeated in  June of 2015. 5. Hypertension. He is on Norvasc and Hyzaar per PCP.   6. Followup will be in 4 weeks.     [facility] 3/11/20159:09 AM

## 2014-01-22 ENCOUNTER — Encounter (HOSPITAL_COMMUNITY): Payer: Self-pay

## 2014-01-22 ENCOUNTER — Ambulatory Visit (HOSPITAL_COMMUNITY)
Admission: RE | Admit: 2014-01-22 | Discharge: 2014-01-22 | Disposition: A | Discharge status: Home / Self Care | Payer: Medicare Other | Source: Ambulatory Visit | Attending: Oncology | Admitting: Oncology

## 2014-01-22 ENCOUNTER — Other Ambulatory Visit (HOSPITAL_BASED_OUTPATIENT_CLINIC_OR_DEPARTMENT_OTHER): Payer: Medicare Other

## 2014-01-22 DIAGNOSIS — E049 Nontoxic goiter, unspecified: Secondary | ICD-10-CM | POA: Insufficient documentation

## 2014-01-22 DIAGNOSIS — C61 Malignant neoplasm of prostate: Secondary | ICD-10-CM

## 2014-01-22 DIAGNOSIS — I10 Essential (primary) hypertension: Secondary | ICD-10-CM

## 2014-01-22 DIAGNOSIS — R599 Enlarged lymph nodes, unspecified: Secondary | ICD-10-CM | POA: Insufficient documentation

## 2014-01-22 LAB — COMPREHENSIVE METABOLIC PANEL (CC13)
ALBUMIN: 3.8 g/dL (ref 3.5–5.0)
ALT: 8 U/L (ref 0–55)
ANION GAP: 11 meq/L (ref 3–11)
AST: 14 U/L (ref 5–34)
Alkaline Phosphatase: 63 U/L (ref 40–150)
BUN: 17 mg/dL (ref 7.0–26.0)
CALCIUM: 9.4 mg/dL (ref 8.4–10.4)
CHLORIDE: 107 meq/L (ref 98–109)
CO2: 27 meq/L (ref 22–29)
Creatinine: 1 mg/dL (ref 0.7–1.3)
GLUCOSE: 112 mg/dL (ref 70–140)
Potassium: 4 mEq/L (ref 3.5–5.1)
SODIUM: 145 meq/L (ref 136–145)
TOTAL PROTEIN: 7.1 g/dL (ref 6.4–8.3)
Total Bilirubin: 0.42 mg/dL (ref 0.20–1.20)

## 2014-01-22 LAB — CBC WITH DIFFERENTIAL/PLATELET
BASO%: 0.5 % (ref 0.0–2.0)
Basophils Absolute: 0 10*3/uL (ref 0.0–0.1)
EOS ABS: 0.4 10*3/uL (ref 0.0–0.5)
EOS%: 8 % — ABNORMAL HIGH (ref 0.0–7.0)
HCT: 34.2 % — ABNORMAL LOW (ref 38.4–49.9)
HGB: 11 g/dL — ABNORMAL LOW (ref 13.0–17.1)
LYMPH#: 1.5 10*3/uL (ref 0.9–3.3)
LYMPH%: 31.3 % (ref 14.0–49.0)
MCH: 27.2 pg (ref 27.2–33.4)
MCHC: 32.2 g/dL (ref 32.0–36.0)
MCV: 84.4 fL (ref 79.3–98.0)
MONO#: 0.4 10*3/uL (ref 0.1–0.9)
MONO%: 8.2 % (ref 0.0–14.0)
NEUT%: 52 % (ref 39.0–75.0)
NEUTROS ABS: 2.6 10*3/uL (ref 1.5–6.5)
Platelets: 220 10*3/uL (ref 140–400)
RBC: 4.05 10*6/uL — AB (ref 4.20–5.82)
RDW: 16.9 % — ABNORMAL HIGH (ref 11.0–14.6)
WBC: 4.9 10*3/uL (ref 4.0–10.3)

## 2014-01-22 MED ORDER — IOHEXOL 300 MG/ML  SOLN
100.0000 mL | Freq: Once | INTRAMUSCULAR | Status: AC | PRN
  Administered 2014-01-22: 100 mL via INTRAVENOUS

## 2014-01-23 LAB — PSA: PSA: 359.7 ng/mL — AB (ref <=4.00)

## 2014-01-24 ENCOUNTER — Other Ambulatory Visit: Payer: Self-pay | Admitting: *Deleted

## 2014-01-24 ENCOUNTER — Telehealth: Payer: Self-pay | Admitting: Oncology

## 2014-01-24 ENCOUNTER — Telehealth: Payer: Self-pay | Admitting: *Deleted

## 2014-01-24 ENCOUNTER — Ambulatory Visit (HOSPITAL_BASED_OUTPATIENT_CLINIC_OR_DEPARTMENT_OTHER): Payer: Medicare Other | Admitting: Oncology

## 2014-01-24 ENCOUNTER — Encounter: Payer: Self-pay | Admitting: Oncology

## 2014-01-24 VITALS — BP 133/71 | HR 62 | Temp 97.4°F | Resp 18 | Ht 68.0 in | Wt 237.3 lb

## 2014-01-24 DIAGNOSIS — C61 Malignant neoplasm of prostate: Secondary | ICD-10-CM

## 2014-01-24 DIAGNOSIS — I10 Essential (primary) hypertension: Secondary | ICD-10-CM

## 2014-01-24 DIAGNOSIS — E291 Testicular hypofunction: Secondary | ICD-10-CM

## 2014-01-24 MED ORDER — LIDOCAINE-PRILOCAINE 2.5-2.5 % EX CREA: 1.0000 "application " | TOPICAL_CREAM | CUTANEOUS | Status: DC | PRN

## 2014-01-24 MED ORDER — ONDANSETRON HCL 8 MG PO TABS: 8.0000 mg | ORAL_TABLET | Freq: Three times a day (TID) | ORAL | Status: DC | PRN

## 2014-01-24 NOTE — Telephone Encounter (Signed)
Gave pt appt for lab,md, injection and chemo class for April and MAy, emailed michelle regarding chemo for MAy 2015

## 2014-01-24 NOTE — Progress Notes (Signed)
Hematology and Oncology Follow Up Visit  [name] [id number] 10-12-1945 68 y.o. 01/24/2014 9:24 AM   [name], [name]  [name], [name]  Principle Diagnosis:This is a 68 year old gentleman with prostate cancer initially diagnosed in 2001.  He had a Gleason score of 3 + 3 = 6.  He has castration-resistant disease with pelvic adenopathy.    Prior Therapy:  1. Status post prostatectomy done in May 2001.  He had T3 N1 disease.  PSA nadir to 0.  2. The patient developed biochemical relapse and received prostatic bed radiation.  3. The patient received Lupron with Casodex due to rising PSA.  The patient subsequently developed castration-resistant disease. 4. Patient treated with Casodex withdrawal and subsequently PSA rose to 18. 5. Patient with Provenge immunotherapy completed in July 2011. 6.  He received ketoconazole and prednisone December 2011 through January 2014. 7.         He is S/P Zytiga 1000 mg daily with Prednisone 5 mg daily on 11/03/12 till 07/2013. This was stopped due to progression of disease.   Current therapy: 1. He started on Xtandi 1000 mg daily 07/2013. 2. He is on Lupron 30 mg every 4 months. He is due for an injection in 11/2013 and will be repeated again in 03/2014.   Interim History: [name] presents today for a followup visit. He started on Xtandi in 07/2013 with out any significant toxicity. Since his last visit he has been doing very well.He has no new complaints. He is not reporting any nausea.  He is not reporting any vomiting.  Had not reported any abdominal pain or back pain.  He had not had any other GI toxicities at this time.  Overall, his performance status and activity levels are close to base line.  He continues to perform activities of daily living without any hindrance or decline.  He had not reported any back pain.  Had not reported any genitourinary complaints.He is not reporting any urinary issues or bleeding. His continued to  work close to full time for the time. He has not reported any neurological complaints or decline in his quality of life. He has not reported any seizures or decline in his exercise tolerance. He has not reported any lymphadenopathy or pruritus. He has not reported any petechiae or bleeding.  Medications: I have reviewed the patient's current medications. Current Outpatient Prescriptions  Medication Sig Dispense Refill  . amLODipine (NORVASC) 5 MG tablet Take 5 mg by mouth every evening.       [name] aspirin 81 MG tablet Take 81 mg by mouth daily.        . calcium carbonate (OS-CAL) 1250 MG chewable tablet Chew 2 tablets by mouth daily.      [name] leuprolide (LUPRON) 30 MG injection Inject 30 mg into the muscle every 4 (four) months.       [name] losartan-hydrochlorothiazide (HYZAAR) 100-12.5 MG per tablet Take 1 tablet by mouth every morning.       Gillermina Phy 40 MG capsule TAKE 4 CAPSULES BY MOUTH DAILY  120 capsule  0   No current facility-administered medications for this visit.    Allergies: No Known Allergies  Past Medical History, Surgical history, Social history, and Family History were reviewed and updated.  Review of Systems:  Remaining ROS negative.  Physical Exam: Blood pressure 133/71, pulse 62, temperature 97.4 F (36.3 C), temperature source Oral, resp. rate 18, height 5\' 8"  (1.727 m), weight 237 lb 4.8 oz (107.639  kg), SpO2 99.00%. ECOG: 1 General appearance: alert awake appeared in no active distress. Head: Normocephalic, without obvious abnormality, atraumatic Neck: no adenopathy, no carotid bruit, no JVD, supple, symmetrical, trachea midline and thyroid not enlarged, symmetric, no tenderness/mass/nodules Lymph nodes: Cervical, supraclavicular, and axillary nodes normal. Heart:regular rate and rhythm, S1, S2 normal, no murmur, click, rub or gallop Lung:chest clear, no wheezing, rales, normal symmetric air entry, Heart exam - S1, S2 normal, no murmur, no gallop, rate regular Abdomen:  soft, non-tender, without masses or organomegaly LFY:BOFBP edema noted.  Lab Results: Lab Results  Component Value Date   WBC 4.9 01/22/2014   HGB 11.0* 01/22/2014   HCT 34.2* 01/22/2014   MCV 84.4 01/22/2014   PLT 220 01/22/2014     Chemistry      Component Value Date/Time   NA 145 01/22/2014 1057   NA 145 10/22/2013 0623   K 4.0 01/22/2014 1057   K 3.4* 10/22/2013 0623   CL 108* 03/09/2013 0923   CL 108 12/27/2012 0415   CO2 27 01/22/2014 1057   CO2 28 12/27/2012 0415   BUN 17.0 01/22/2014 1057   BUN 17 12/27/2012 0415   CREATININE 1.0 01/22/2014 1057   CREATININE 0.91 12/27/2012 0415      Component Value Date/Time   CALCIUM 9.4 01/22/2014 1057   CALCIUM 7.9* 12/27/2012 0415   ALKPHOS 63 01/22/2014 1057   ALKPHOS 71 12/27/2012 0415   AST 14 01/22/2014 1057   AST 17 12/27/2012 0415   ALT 8 01/22/2014 1057   ALT 24 12/27/2012 0415   BILITOT 0.42 01/22/2014 1057   BILITOT 0.3 12/27/2012 0415       Results for [name], [name] (MRN [id number]) as of 01/24/2014 09:26  Ref. Range 12/12/2013 09:19 01/22/2014 10:57  PSA Latest Range: <=4.00 ng/mL 274.50 (H) 359.70 (H)    EXAM:  CT CHEST, ABDOMEN, AND PELVIS WITH CONTRAST  TECHNIQUE:  Multidetector CT imaging of the chest, abdomen and pelvis was  performed following the standard protocol during bolus  administration of intravenous contrast.  CONTRAST: 143mL OMNIPAQUE IOHEXOL 300 MG/ML SOLN  COMPARISON: Abdomen pelvis CT on 05/15/2013 and chest CT on  08/27/2010  FINDINGS:  CT CHEST FINDINGS  Diffuse goiter with substernal extension into the superior  mediastinum remains stable. No other mediastinal or hilar masses are  identified. No lymphadenopathy seen elsewhere within the thorax.  No evidence of pleural or pericardial effusion. A tiny 4 mm  pulmonary nodule in the anterior left upper lobe on image 17 is  stable since 2011 and consistent with benign etiology. No new or  enlarging pulmonary nodules or masses are identified. No evidence  of  pulmonary infiltrate or central endobronchial lesion. No suspicious  bone lesions identified.  CT ABDOMEN AND PELVIS FINDINGS  Increased upper abdominal lymphadenopathy is seen in the  gastrohepatic ligament, porta hepatis and central small bowel  mesentery. Increased lymphadenopathy is also seen throughout the  abdominal retroperitoneum. Largest area of confluent lymphadenopathy  currently measures 3.4 x 12.6 cm on image 65 compared to 3.1 x 7.4  cm previously there is also increased lymphadenopathy seen within  the bilateral iliac chains and throughout the sigmoid mesentery and  perirectal region in the central pelvis. Index lymphadenopathy in  the left iliac chain now measures 2.4 x 3.1 cm on image 90 compared  to 2.0 x 2.0 cm previously. Penile prosthesis remains in place.  The liver, gallbladder, pancreas, spleen, and adrenal glands are  normal in appearance. A enhancing  mass is now seen in the upper pole  of the left kidney which measures 3.4 x 3.8 cm on image 51 and is  consistent with renal cell carcinoma, with renal metastasis  considered much less likely. Other tiny renal cysts are noted  bilaterally, but no other suspicious renal masses are identified. A  right ureteral stent is seen in appropriate position, and there is  no evidence hydronephrosis. No suspicious bone lesions identified.  IMPRESSION:  No evidence of metastatic disease or other acute findings within the  thorax. Stable substernal goiter.  Increased lymphadenopathy throughout the abdomen and pelvis,  consistent with progression of metastatic disease.  3.8 cm enhancing mass now seen in the upper pole of left kidney,  highly suspicious for renal cell carcinoma, with renal metastasis  considered much less likely.     Impression and Plan:  This is a 68 year old gentleman with the following issues: 1. Castration-resistant prostate cancer with pelvic adenopathy. He was started on Xtandi in 07/2013. With  PSA down to 160 after one month of therapy now it's up to 359. The CT scan on 01/22/2014 was discussed with the patient extensively today. He is showing clear progression by his PSA criteria as well as CT scan results. Options of treatments were discussed today and I feel that his best choice is systemic chemotherapy. Risks and benefits of Taxotere chemotherapy were discussed today extensively. Complications that included nausea, vomiting, fatigue, neuropathy, infusion-related toxicity, myelosuppression, neutropenia, neutropenic sepsis and less likely severe illness and less likely death. I will also explained to him complications associated with hair and nail changes as well as lacrimal duct changes. Peripheral edema as well as anemia also a possibility. He understands these complications and willing to proceed knowing that the benefit at this point is to decrease his PSA on the reduction of his lymphadenopathy. He will also attend chemotherapy education class prior to the start of chemotherapy. We will also prescribe him Zofran as an antiemetic.  2. Neutropenia prophylaxis: He will receive Neulasta after each injection. 3. IV access: Risks and benefits of Port-A-Cath insertion was discussed today. Complications including bleeding, infection, and thrombosis were discussed and he is agreeable to proceed. We will prescribe him EMLA cream as well. 4. Hydronephrosis: He is S/P right  nephrostomy tube on 3/14 and a recent exchange on 10/22/2013. 5. Sepsis and thrombocytopenia: Unlikely related to Zytiga or cancer. Resolved now.  6. Hormonal deprivation.  He continues to be on Lupron. He will receive Lupron today repeated in June of 2015. 7. Hypertension. He is on Norvasc and Hyzaar per PCP.   8. Followup will be in on May 6 for the first cycle of Taxotere chemotherapy. He will have a clinical visit on May 27 with his second cycle of chemotherapy.    [name] 4/16/20159:24 AM

## 2014-01-24 NOTE — Telephone Encounter (Signed)
Per staff message and POF I have scheduled appts.  JMW  

## 2014-01-29 ENCOUNTER — Telehealth: Payer: Self-pay | Admitting: Oncology

## 2014-01-29 NOTE — Telephone Encounter (Signed)
Called pt and left message regarding chemo class, chemo, labs and MD

## 2014-01-31 ENCOUNTER — Other Ambulatory Visit: Payer: Medicare Other

## 2014-01-31 ENCOUNTER — Telehealth: Payer: Self-pay | Admitting: Oncology

## 2014-01-31 ENCOUNTER — Other Ambulatory Visit: Payer: Self-pay | Admitting: Radiology

## 2014-01-31 NOTE — Telephone Encounter (Signed)
Pt came by today and gave him appt calendar

## 2014-02-06 ENCOUNTER — Encounter (HOSPITAL_COMMUNITY): Payer: Self-pay

## 2014-02-06 ENCOUNTER — Ambulatory Visit (HOSPITAL_COMMUNITY)
Admission: RE | Admit: 2014-02-06 | Discharge: 2014-02-06 | Disposition: A | Discharge status: Home / Self Care | Payer: Medicare Other | Source: Ambulatory Visit | Attending: Oncology | Admitting: Oncology

## 2014-02-06 ENCOUNTER — Other Ambulatory Visit: Payer: Self-pay | Admitting: Oncology

## 2014-02-06 DIAGNOSIS — C801 Malignant (primary) neoplasm, unspecified: Secondary | ICD-10-CM | POA: Insufficient documentation

## 2014-02-06 DIAGNOSIS — C61 Malignant neoplasm of prostate: Secondary | ICD-10-CM | POA: Insufficient documentation

## 2014-02-06 DIAGNOSIS — Z79899 Other long term (current) drug therapy: Secondary | ICD-10-CM | POA: Insufficient documentation

## 2014-02-06 DIAGNOSIS — E119 Type 2 diabetes mellitus without complications: Secondary | ICD-10-CM | POA: Insufficient documentation

## 2014-02-06 DIAGNOSIS — I10 Essential (primary) hypertension: Secondary | ICD-10-CM | POA: Insufficient documentation

## 2014-02-06 DIAGNOSIS — Z7982 Long term (current) use of aspirin: Secondary | ICD-10-CM | POA: Insufficient documentation

## 2014-02-06 DIAGNOSIS — Z86711 Personal history of pulmonary embolism: Secondary | ICD-10-CM | POA: Insufficient documentation

## 2014-02-06 DIAGNOSIS — Z9079 Acquired absence of other genital organ(s): Secondary | ICD-10-CM | POA: Insufficient documentation

## 2014-02-06 DIAGNOSIS — N529 Male erectile dysfunction, unspecified: Secondary | ICD-10-CM | POA: Insufficient documentation

## 2014-02-06 DIAGNOSIS — Z96659 Presence of unspecified artificial knee joint: Secondary | ICD-10-CM | POA: Insufficient documentation

## 2014-02-06 DIAGNOSIS — Z86718 Personal history of other venous thrombosis and embolism: Secondary | ICD-10-CM | POA: Insufficient documentation

## 2014-02-06 DIAGNOSIS — G4733 Obstructive sleep apnea (adult) (pediatric): Secondary | ICD-10-CM | POA: Insufficient documentation

## 2014-02-06 HISTORY — PX: PORTACATH PLACEMENT: SHX2246

## 2014-02-06 LAB — BASIC METABOLIC PANEL
BUN: 12 mg/dL (ref 6–23)
CALCIUM: 9.4 mg/dL (ref 8.4–10.5)
CO2: 26 mEq/L (ref 19–32)
CREATININE: 0.89 mg/dL (ref 0.50–1.35)
Chloride: 104 mEq/L (ref 96–112)
GFR calc Af Amer: >90 mL/min (ref >90)
GFR calc non Af Amer: 87 mL/min — ABNORMAL LOW (ref >90)
GLUCOSE: 110 mg/dL — AB (ref 70–99)
Potassium: 3.7 mEq/L (ref 3.7–5.3)
SODIUM: 141 meq/L (ref 137–147)

## 2014-02-06 LAB — CBC
HCT: 33.7 % — ABNORMAL LOW (ref 39.0–52.0)
Hemoglobin: 11 g/dL — ABNORMAL LOW (ref 13.0–17.0)
MCH: 27.1 pg (ref 26.0–34.0)
MCHC: 32.6 g/dL (ref 30.0–36.0)
MCV: 83 fL (ref 78.0–100.0)
PLATELETS: 218 10*3/uL (ref 150–400)
RBC: 4.06 MIL/uL — AB (ref 4.22–5.81)
RDW: 15.6 % — ABNORMAL HIGH (ref 11.5–15.5)
WBC: 5 10*3/uL (ref 4.0–10.5)

## 2014-02-06 LAB — PROTIME-INR
INR: 0.99 (ref 0.00–1.49)
Prothrombin Time: 12.9 seconds (ref 11.6–15.2)

## 2014-02-06 LAB — APTT: aPTT: 31 seconds (ref 24–37)

## 2014-02-06 LAB — GLUCOSE, CAPILLARY: Glucose-Capillary: 102 mg/dL — ABNORMAL HIGH (ref 70–99)

## 2014-02-06 MED ORDER — LIDOCAINE-EPINEPHRINE (PF) 2 %-1:200000 IJ SOLN
INTRAMUSCULAR | Status: DC
Start: 2014-02-06 — End: 2014-02-07
  Filled 2014-02-06: qty 20

## 2014-02-06 MED ORDER — HEPARIN SOD (PORK) LOCK FLUSH 100 UNIT/ML IV SOLN
500.0000 [IU] | Freq: Once | INTRAVENOUS | Status: AC
  Administered 2014-02-06: 500 [IU] via INTRAVENOUS

## 2014-02-06 MED ORDER — HEPARIN SOD (PORK) LOCK FLUSH 100 UNIT/ML IV SOLN
INTRAVENOUS | Status: AC
  Filled 2014-02-06: qty 5

## 2014-02-06 MED ORDER — CEFAZOLIN SODIUM-DEXTROSE 2-3 GM-% IV SOLR
2.0000 g | Freq: Once | INTRAVENOUS | Status: AC
  Administered 2014-02-06: 2 g via INTRAVENOUS
  Filled 2014-02-06: qty 50

## 2014-02-06 MED ORDER — MIDAZOLAM HCL 2 MG/2ML IJ SOLN
INTRAMUSCULAR | Status: AC
  Filled 2014-02-06: qty 4

## 2014-02-06 MED ORDER — FENTANYL CITRATE 0.05 MG/ML IJ SOLN
INTRAMUSCULAR | Status: AC | PRN
  Administered 2014-02-06: 100 ug via INTRAVENOUS

## 2014-02-06 MED ORDER — SODIUM CHLORIDE 0.9 % IV SOLN: Freq: Once | INTRAVENOUS | Status: DC

## 2014-02-06 MED ORDER — MIDAZOLAM HCL 2 MG/2ML IJ SOLN
INTRAMUSCULAR | Status: AC | PRN
  Administered 2014-02-06: 1 mg via INTRAVENOUS
  Administered 2014-02-06: 2 mg via INTRAVENOUS

## 2014-02-06 MED ORDER — FENTANYL CITRATE 0.05 MG/ML IJ SOLN
INTRAMUSCULAR | Status: AC
  Filled 2014-02-06: qty 4

## 2014-02-06 NOTE — H&P (Signed)
Chief Complaint: "I'm here for a portacath" Referring Physician:Shadad HPI: [name]. is an 68 y.o. male with metastatic prostate cancer. He is going to start chemotherapy and is scheduled for port placement. PMHx and meds reviewed. He is feeling well today. No recent fevers, chills, illness.  Past Medical History:  Past Medical History  Diagnosis Date  . Hypertension   . Osteoarthritis   . Retroperitoneal lymphadenopathy   . History of pulmonary embolus (PE)     dec 2009  . History of DVT (deep vein thrombosis)     dec 2009  . OSA on CPAP   . Neoplasm of uncertain behavior of kidney     LEFT RENAL UPPER POLE MASS--  MONTIORED BY UROLOGIST  DR Diona Fanti  . History of acute renal failure     W/ SEPSIS  MARCH 2014  . Hydronephrosis, right   . 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 Type 2 diabetes mellitus   . Urgency of urination   . ED (erectile dysfunction)     Past Surgical History:  Past Surgical History  Procedure Laterality Date  . Total knee arthroplasty Bilateral RIGHT  04-24-2009/   LEFT  2005  . Insertion ivc filter and removal  03-25-2009/   REMOVAL 05-29-2009  . Prostatectomy  MAY 2001  . Transthoracic echocardiogram  12-27-2012    MILD LVH/  EF 62%/  GRADE I DIASTOLIC DYSFUNCTION  . Inguinal hernia repair Right 2010  . Penile prosthesis implant  2005  . Cystoscopy w/ ureteral stent placement Right 10/22/2013    Procedure: CYSTOSCOPY WITH STENT REPLACEMENT;  Surgeon: Franchot Gallo, MD;  Location: [facility];  Service: Urology;  Laterality: Right;    Family History:  Family History  Problem Relation Age of Onset  . Colon cancer Neg Hx   . Heart disease Mother     Social History:  reports that he has never smoked. He has never used smokeless tobacco. He reports that he drinks about 3.6 ounces of alcohol per week. He reports  that he does not use illicit drugs.  Allergies: No Known Allergies  Medications:   Medication List    ASK your doctor about these medications       amLODipine 5 MG tablet  Commonly known as:  NORVASC  Take 5 mg by mouth every evening.     aspirin 81 MG tablet  Take 81 mg by mouth daily.     calcium carbonate 1250 MG chewable tablet  Commonly known as:  OS-CAL  Chew 2 tablets by mouth daily.     leuprolide 30 MG injection  Commonly known as:  LUPRON  Inject 30 mg into the muscle every 4 (four) months.     lidocaine-prilocaine cream  Commonly known as:  EMLA  Apply 1 application topically as needed.     losartan-hydrochlorothiazide 100-12.5 MG per tablet  Commonly known as:  HYZAAR  Take 1 tablet by mouth every morning.     ondansetron 8 MG tablet  Commonly known as:  ZOFRAN  Take 1 tablet (8 mg total) by mouth every 8 (eight) hours as needed for nausea or vomiting.        Please HPI for pertinent positives, otherwise complete 10 system ROS negative.  Physical Exam: BP 163/89  Pulse 60  Temp(Src) 98.8 F (37.1 C) (Oral)  Resp 18  Ht $R'5\' 8"'hQ$  (1.727 m)  Wt  227 lb (102.967 kg)  BMI 34.52 kg/m2  SpO2 100% Body mass index is 34.52 kg/(m^2).   General Appearance:  Alert, cooperative, no distress, appears stated age  Head:  Normocephalic, without obvious abnormality, atraumatic  ENT: Unremarkable  Neck: Supple, symmetrical, trachea midline  Lungs:   Clear to auscultation bilaterally, no w/r/r, respirations unlabored without use of accessory muscles.  Chest Wall:  No tenderness or deformity  Heart:  Regular rate and rhythm, S1, S2 normal, no murmur, rub or gallop.  Neurologic: Normal affect, no gross deficits.   Results for orders placed during the hospital encounter of 02/06/14 (from the past 48 hour(s))  APTT     Status: None   Collection Time    02/06/14 10:05 AM      Result Value Ref Range   aPTT 31  24 - 37 seconds  BASIC METABOLIC PANEL     Status:  Abnormal   Collection Time    02/06/14 10:05 AM      Result Value Ref Range   Sodium 141  137 - 147 mEq/L   Potassium 3.7  3.7 - 5.3 mEq/L   Chloride 104  96 - 112 mEq/L   CO2 26  19 - 32 mEq/L   Glucose, Bld 110 (*) 70 - 99 mg/dL   BUN 12  6 - 23 mg/dL   Creatinine, Ser 0.89  0.50 - 1.35 mg/dL   Calcium 9.4  8.4 - 10.5 mg/dL   GFR calc non Af Amer 87 (*) >90 mL/min   GFR calc Af Amer >90  >90 mL/min   Comment: (NOTE)     The eGFR has been calculated using the CKD EPI equation.     This calculation has not been validated in all clinical situations.     eGFR's persistently <90 mL/min signify possible Chronic Kidney     Disease.  CBC     Status: Abnormal   Collection Time    02/06/14 10:05 AM      Result Value Ref Range   WBC 5.0  4.0 - 10.5 K/uL   RBC 4.06 (*) 4.22 - 5.81 MIL/uL   Hemoglobin 11.0 (*) 13.0 - 17.0 g/dL   HCT 33.7 (*) 39.0 - 52.0 %   MCV 83.0  78.0 - 100.0 fL   MCH 27.1  26.0 - 34.0 pg   MCHC 32.6  30.0 - 36.0 g/dL   RDW 15.6 (*) 11.5 - 15.5 %   Platelets 218  150 - 400 K/uL  PROTIME-INR     Status: None   Collection Time    02/06/14 10:05 AM      Result Value Ref Range   Prothrombin Time 12.9  11.6 - 15.2 seconds   INR 0.99  0.00 - 1.49   No results found.  Assessment/Plan Metastatic prostate cancer For Port placement Discussed procedure, risks, complications, use of sedation. Labs pending Consent signed in chart.  Ascencion Dike PA-C 02/06/2014, 10:50 AM

## 2014-02-06 NOTE — Discharge Instructions (Signed)
May remove gauze tomorrow and take a shower. Leave the steri strips on as they will peel off in approximately two weeks. Call MD if any redness, drainage, or swelling. Call for fever or increased pain. May use ice pack over area (not directly on skin).           Implanted [facility] Guide An implanted port is a type of central line that is placed under the skin. Central lines are used to provide IV access when treatment or nutrition needs to be given through a person's veins. Implanted ports are used for long-term IV access. An implanted port may be placed because:   You need IV medicine that would be irritating to the small veins in your hands or arms.   You need long-term IV medicines, such as antibiotics.   You need IV nutrition for a long period.   You need frequent blood draws for lab tests.   You need dialysis.  Implanted ports are usually placed in the chest area, but they can also be placed in the upper arm, the abdomen, or the leg. An implanted port has two main parts:   Reservoir. The reservoir is round and will appear as a small, raised area under your skin. The reservoir is the part where a needle is inserted to give medicines or draw blood.   Catheter. The catheter is a thin, flexible tube that extends from the reservoir. The catheter is placed into a large vein. Medicine that is inserted into the reservoir goes into the catheter and then into the vein.  HOW WILL I CARE FOR MY INCISION SITE? Do not get the incision site wet. Bathe or shower as directed by your health care provider.  HOW IS MY PORT ACCESSED? Special steps must be taken to access the port:   Before the port is accessed, a numbing cream can be placed on the skin. This helps numb the skin over the port site.   Your health care provider uses a sterile technique to access the port.  Your health care provider must put on a mask and sterile gloves.  The skin over your port is cleaned carefully  with an antiseptic and allowed to dry.  The port is gently pinched between sterile gloves, and a needle is inserted into the port.  Only "non-coring" port needles should be used to access the port. Once the port is accessed, a blood return should be checked. This helps ensure that the port is in the vein and is not clogged.   If your port needs to remain accessed for a constant infusion, a clear (transparent) bandage will be placed over the needle site. The bandage and needle will need to be changed every week, or as directed by your health care provider.   Keep the bandage covering the needle clean and dry. Do not get it wet. Follow your health care provider's instructions on how to take a shower or bath while the port is accessed.   If your port does not need to stay accessed, no bandage is needed over the port.  WHAT IS FLUSHING? Flushing helps keep the port from getting clogged. Follow your health care provider's instructions on how and when to flush the port. Ports are usually flushed with saline solution or a medicine called heparin. The need for flushing will depend on how the port is used.   If the port is used for intermittent medicines or blood draws, the port will need to be  flushed:   After medicines have been given.   After blood has been drawn.   As part of routine maintenance.   If a constant infusion is running, the port may not need to be flushed.  HOW LONG WILL MY PORT STAY IMPLANTED? The port can stay in for as long as your health care provider thinks it is needed. When it is time for the port to come out, surgery will be done to remove it. The procedure is similar to the one performed when the port was put in.  WHEN SHOULD I SEEK IMMEDIATE MEDICAL CARE? When you have an implanted port, you should seek immediate medical care if:   You notice a bad smell coming from the incision site.   You have swelling, redness, or drainage at the incision site.   You  have more swelling or pain at the port site or the surrounding area.   You have a fever that is not controlled with medicine. Document Released: 09/27/2005 Document Revised: 07/18/2013 Document Reviewed: 06/04/2013    Moderate Sedation, Adult Moderate sedation is given to help you relax or even sleep through a procedure. You may remain sleepy, be clumsy, or have poor balance for several hours following this procedure. Arrange for a responsible adult, family member, or friend to take you home. A responsible adult should stay with you for at least 24 hours or until the medicines have worn off.  Do not participate in any activities where you could become injured for the next 24 hours, or until you feel normal again. Do not:  Drive.  Swim.  Ride a bicycle.  Operate heavy machinery.  Cook.  Use power tools.  Climb ladders.  Work at [employer].  Do not make important decisions or sign legal documents until you are improved.  Vomiting may occur if you eat too soon. When you can drink without vomiting, try water, juice, or soup. Try solid foods if you feel little or no nausea.  Only take over-the-counter or prescription medications for pain, discomfort, or fever as directed by your caregiver.If pain medications have been prescribed for you, ask your caregiver how soon it is safe to take them.  Make sure you and your family fully understands everything about the medication given to you. Make sure you understand what side effects may occur.  You should not drink alcohol, take sleeping pills, or medications that cause drowsiness for at least 24 hours.  If you smoke, do not smoke alone.  If you are feeling better, you may resume normal activities 24 hours after receiving sedation.  Keep all appointments as scheduled. Follow all instructions.  Ask questions if you do not understand. SEEK MEDICAL CARE IF:   Your skin is pale or bluish in color.  You continue to feel sick to your  stomach (nauseous) or throw up (vomit).  Your pain is getting worse and not helped by medication.  You have bleeding or swelling.  You are still sleepy or feeling clumsy after 24 hours. SEEK IMMEDIATE MEDICAL CARE IF:   You develop a rash.  You have difficulty breathing.  You develop any type of allergic problem.  You have a fever. Document Released: 06/22/2001 Document Revised: 12/20/2011 Document Reviewed: 06/04/2013 [facility] Patient Information 2014 [place]. ExitCare Patient Information 2014 [place].

## 2014-02-06 NOTE — Procedures (Signed)
Successful placement of right IJ approach port-a-cath with tip at the superior caval atrial junction. The catheter is ready for immediate use. No immediate post procedural complications. 

## 2014-02-13 ENCOUNTER — Other Ambulatory Visit (HOSPITAL_BASED_OUTPATIENT_CLINIC_OR_DEPARTMENT_OTHER): Payer: Medicare Other

## 2014-02-13 ENCOUNTER — Ambulatory Visit (HOSPITAL_BASED_OUTPATIENT_CLINIC_OR_DEPARTMENT_OTHER): Payer: Medicare Other

## 2014-02-13 VITALS — BP 153/87 | HR 64 | Temp 98.6°F | Resp 18

## 2014-02-13 DIAGNOSIS — C61 Malignant neoplasm of prostate: Secondary | ICD-10-CM

## 2014-02-13 DIAGNOSIS — Z5111 Encounter for antineoplastic chemotherapy: Secondary | ICD-10-CM

## 2014-02-13 LAB — CBC WITH DIFFERENTIAL/PLATELET
BASO%: 1 % (ref 0.0–2.0)
BASOS ABS: 0.1 10*3/uL (ref 0.0–0.1)
EOS ABS: 0.5 10*3/uL (ref 0.0–0.5)
EOS%: 9.5 % — ABNORMAL HIGH (ref 0.0–7.0)
HCT: 33.3 % — ABNORMAL LOW (ref 38.4–49.9)
HGB: 10.6 g/dL — ABNORMAL LOW (ref 13.0–17.1)
LYMPH%: 24.2 % (ref 14.0–49.0)
MCH: 27.2 pg (ref 27.2–33.4)
MCHC: 31.9 g/dL — AB (ref 32.0–36.0)
MCV: 85.2 fL (ref 79.3–98.0)
MONO#: 0.4 10*3/uL (ref 0.1–0.9)
MONO%: 7.4 % (ref 0.0–14.0)
NEUT#: 3.3 10*3/uL (ref 1.5–6.5)
NEUT%: 57.9 % (ref 39.0–75.0)
Platelets: 200 10*3/uL (ref 140–400)
RBC: 3.92 10*6/uL — ABNORMAL LOW (ref 4.20–5.82)
RDW: 15.9 % — ABNORMAL HIGH (ref 11.0–14.6)
WBC: 5.7 10*3/uL (ref 4.0–10.3)
lymph#: 1.4 10*3/uL (ref 0.9–3.3)

## 2014-02-13 LAB — COMPREHENSIVE METABOLIC PANEL (CC13)
ALT: 7 U/L (ref 0–55)
AST: 12 U/L (ref 5–34)
Albumin: 3.5 g/dL (ref 3.5–5.0)
Alkaline Phosphatase: 64 U/L (ref 40–150)
Anion Gap: 10 mEq/L (ref 3–11)
BILIRUBIN TOTAL: 0.34 mg/dL (ref 0.20–1.20)
BUN: 11.1 mg/dL (ref 7.0–26.0)
CALCIUM: 9.5 mg/dL (ref 8.4–10.4)
CO2: 26 meq/L (ref 22–29)
CREATININE: 0.9 mg/dL (ref 0.7–1.3)
Chloride: 111 mEq/L — ABNORMAL HIGH (ref 98–109)
GLUCOSE: 109 mg/dL (ref 70–140)
Potassium: 3.8 mEq/L (ref 3.5–5.1)
Sodium: 146 mEq/L — ABNORMAL HIGH (ref 136–145)
Total Protein: 6.8 g/dL (ref 6.4–8.3)

## 2014-02-13 MED ORDER — DOCETAXEL CHEMO INJECTION 160 MG/16ML
75.0000 mg/m2 | Freq: Once | INTRAVENOUS | Status: AC
  Administered 2014-02-13: 170 mg via INTRAVENOUS
  Filled 2014-02-13: qty 17

## 2014-02-13 MED ORDER — DEXAMETHASONE SODIUM PHOSPHATE 10 MG/ML IJ SOLN
10.0000 mg | Freq: Once | INTRAMUSCULAR | Status: AC
  Administered 2014-02-13: 10 mg via INTRAVENOUS

## 2014-02-13 MED ORDER — ONDANSETRON 8 MG/NS 50 ML IVPB
INTRAVENOUS | Status: AC
  Filled 2014-02-13: qty 8

## 2014-02-13 MED ORDER — HEPARIN SOD (PORK) LOCK FLUSH 100 UNIT/ML IV SOLN
500.0000 [IU] | Freq: Once | INTRAVENOUS | Status: AC | PRN
  Administered 2014-02-13: 500 [IU]
  Filled 2014-02-13: qty 5

## 2014-02-13 MED ORDER — ONDANSETRON 8 MG/50ML IVPB (CHCC)
8.0000 mg | Freq: Once | INTRAVENOUS | Status: AC
  Administered 2014-02-13: 8 mg via INTRAVENOUS

## 2014-02-13 MED ORDER — DEXAMETHASONE SODIUM PHOSPHATE 10 MG/ML IJ SOLN
INTRAMUSCULAR | Status: AC
  Filled 2014-02-13: qty 1

## 2014-02-13 MED ORDER — SODIUM CHLORIDE 0.9 % IV SOLN
Freq: Once | INTRAVENOUS | Status: AC
  Administered 2014-02-13: 10:00:00 via INTRAVENOUS

## 2014-02-13 MED ORDER — SODIUM CHLORIDE 0.9 % IJ SOLN
10.0000 mL | INTRAMUSCULAR | Status: DC | PRN
  Administered 2014-02-13: 10 mL
  Filled 2014-02-13: qty 10

## 2014-02-13 NOTE — Patient Instructions (Signed)
[facility] Discharge Instructions for Patients Receiving Chemotherapy  Today you received the following chemotherapy agent: Taxotere   To help prevent nausea and vomiting after your treatment, we encourage you to take your nausea medication as prescribed. You received Zofran and Decadron at 10 AM in the infusion room prior to chemotherapy    If you develop nausea and vomiting that is not controlled by your nausea medication, call the clinic.   BELOW ARE SYMPTOMS THAT SHOULD BE REPORTED IMMEDIATELY:  *FEVER GREATER THAN 100.5 F  *CHILLS WITH OR WITHOUT FEVER  NAUSEA AND VOMITING THAT IS NOT CONTROLLED WITH YOUR NAUSEA MEDICATION  *UNUSUAL SHORTNESS OF BREATH  *UNUSUAL BRUISING OR BLEEDING  TENDERNESS IN MOUTH AND THROAT WITH OR WITHOUT PRESENCE OF ULCERS  *URINARY PROBLEMS  *BOWEL PROBLEMS  UNUSUAL RASH Items with * indicate a potential emergency and should be followed up as soon as possible.  Feel free to call the clinic you have any questions or concerns. The clinic phone number is [phone].   Docetaxel injection What is this medicine? DOCETAXEL (doe se TAX el) is a chemotherapy drug. It targets fast dividing cells, like cancer cells, and causes these cells to die. This medicine is used to treat many types of cancers like breast cancer, certain stomach cancers, head and neck cancer, lung cancer, and prostate cancer. This medicine may be used for other purposes; ask your health care provider or pharmacist if you have questions. COMMON BRAND NAME(S): Docefrez , Taxotere What should I tell my health care provider before I take this medicine? They need to know if you have any of these conditions: -infection (especially a virus infection such as chickenpox, cold sores, or herpes) -liver disease -low blood counts, like low white cell, platelet, or red cell counts -an unusual or allergic reaction to docetaxel, polysorbate 80, other chemotherapy agents, other  medicines, foods, dyes, or preservatives -pregnant or trying to get pregnant -breast-feeding How should I use this medicine? This drug is given as an infusion into a vein. It is administered in a hospital or clinic by a specially trained health care professional. Talk to your pediatrician regarding the use of this medicine in children. Special care may be needed. Overdosage: If you think you have taken too much of this medicine contact a poison control center or emergency room at once. NOTE: This medicine is only for you. Do not share this medicine with others. What if I miss a dose? It is important not to miss your dose. Call your doctor or health care professional if you are unable to keep an appointment. What may interact with this medicine? -cyclosporine -erythromycin -ketoconazole -medicines to increase blood counts like filgrastim, pegfilgrastim, sargramostim -vaccines Talk to your doctor or health care professional before taking any of these medicines: -acetaminophen -aspirin -ibuprofen -ketoprofen -naproxen This list may not describe all possible interactions. Give your health care provider a list of all the medicines, herbs, non-prescription drugs, or dietary supplements you use. Also tell them if you smoke, drink alcohol, or use illegal drugs. Some items may interact with your medicine. What should I watch for while using this medicine? Your condition will be monitored carefully while you are receiving this medicine. You will need important blood work done while you are taking this medicine. This drug may make you feel generally unwell. This is not uncommon, as chemotherapy can affect healthy cells as well as cancer cells. Report any side effects. Continue your course of treatment even though you feel  ill unless your doctor tells you to stop. In some cases, you may be given additional medicines to help with side effects. Follow all directions for their use. Call your doctor or  health care professional for advice if you get a fever, chills or sore throat, or other symptoms of a cold or flu. Do not treat yourself. This drug decreases your body's ability to fight infections. Try to avoid being around people who are sick. This medicine may increase your risk to bruise or bleed. Call your doctor or health care professional if you notice any unusual bleeding. Be careful brushing and flossing your teeth or using a toothpick because you may get an infection or bleed more easily. If you have any dental work done, tell your dentist you are receiving this medicine. Avoid taking products that contain aspirin, acetaminophen, ibuprofen, naproxen, or ketoprofen unless instructed by your doctor. These medicines may hide a fever. Do not become pregnant while taking this medicine. Women should inform their doctor if they wish to become pregnant or think they might be pregnant. There is a potential for serious side effects to an unborn child. Talk to your health care professional or pharmacist for more information. Do not breast-feed an infant while taking this medicine. What side effects may I notice from receiving this medicine? Side effects that you should report to your doctor or health care professional as soon as possible: -allergic reactions like skin rash, itching or hives, swelling of the face, lips, or tongue -low blood counts - This drug may decrease the number of white blood cells, red blood cells and platelets. You may be at increased risk for infections and bleeding. -signs of infection - fever or chills, cough, sore throat, pain or difficulty passing urine -signs of decreased platelets or bleeding - bruising, pinpoint red spots on the skin, black, tarry stools, nosebleeds -signs of decreased red blood cells - unusually weak or tired, fainting spells, lightheadedness -breathing problems -fast or irregular heartbeat -low blood pressure -mouth sores -nausea and vomiting -pain,  swelling, redness or irritation at the injection site -pain, tingling, numbness in the hands or feet -swelling of the ankle, feet, hands -weight gain Side effects that usually do not require medical attention (report to your prescriber or health care professional if they continue or are bothersome): -bone pain -complete hair loss including hair on your head, underarms, pubic hair, eyebrows, and eyelashes -diarrhea -excessive tearing -changes in the color of fingernails -loosening of the fingernails -nausea -muscle pain -red flush to skin -sweating -weak or tired This list may not describe all possible side effects. Call your doctor for medical advice about side effects. You may report side effects to FDA at 1-800-FDA-1088. Where should I keep my medicine? This drug is given in a hospital or clinic and will not be stored at home. NOTE: This sheet is a summary. It may not cover all possible information. If you have questions about this medicine, talk to your doctor, pharmacist, or health care provider.  2014, [employer]vier/[employer] Standard. (2008-09-09 11:52:10)

## 2014-02-14 ENCOUNTER — Ambulatory Visit (HOSPITAL_BASED_OUTPATIENT_CLINIC_OR_DEPARTMENT_OTHER): Payer: Medicare Other

## 2014-02-14 ENCOUNTER — Telehealth: Payer: Self-pay | Admitting: *Deleted

## 2014-02-14 VITALS — BP 126/67 | HR 62 | Temp 98.9°F

## 2014-02-14 DIAGNOSIS — Z5189 Encounter for other specified aftercare: Secondary | ICD-10-CM

## 2014-02-14 DIAGNOSIS — C61 Malignant neoplasm of prostate: Secondary | ICD-10-CM

## 2014-02-14 MED ORDER — PEGFILGRASTIM INJECTION 6 MG/0.6ML
6.0000 mg | Freq: Once | SUBCUTANEOUS | Status: AC
  Administered 2014-02-14: 6 mg via SUBCUTANEOUS
  Filled 2014-02-14: qty 0.6

## 2014-02-14 NOTE — Telephone Encounter (Signed)
[name] here for Neulasta injection following 1st taxotere chemo treatment.  States that he is doing well only slight nausea.  Using antiemetics as needed with relief.  Is drinking and eating small amounts.  Discussed need to drink lots of fluids.  All questions answered.  Knows to call if he has any problems or concerns.

## 2014-02-14 NOTE — Patient Instructions (Signed)

## 2014-03-06 ENCOUNTER — Ambulatory Visit (HOSPITAL_BASED_OUTPATIENT_CLINIC_OR_DEPARTMENT_OTHER): Payer: Medicare Other | Admitting: Physician Assistant

## 2014-03-06 ENCOUNTER — Ambulatory Visit (HOSPITAL_BASED_OUTPATIENT_CLINIC_OR_DEPARTMENT_OTHER): Payer: Medicare Other

## 2014-03-06 ENCOUNTER — Encounter: Payer: Self-pay | Admitting: Physician Assistant

## 2014-03-06 ENCOUNTER — Other Ambulatory Visit (HOSPITAL_BASED_OUTPATIENT_CLINIC_OR_DEPARTMENT_OTHER): Payer: Medicare Other

## 2014-03-06 VITALS — BP 122/64 | HR 68 | Temp 98.6°F | Resp 18 | Ht 68.0 in | Wt 233.5 lb

## 2014-03-06 DIAGNOSIS — E291 Testicular hypofunction: Secondary | ICD-10-CM

## 2014-03-06 DIAGNOSIS — I10 Essential (primary) hypertension: Secondary | ICD-10-CM

## 2014-03-06 DIAGNOSIS — C61 Malignant neoplasm of prostate: Secondary | ICD-10-CM

## 2014-03-06 DIAGNOSIS — Z5111 Encounter for antineoplastic chemotherapy: Secondary | ICD-10-CM

## 2014-03-06 DIAGNOSIS — N1330 Unspecified hydronephrosis: Secondary | ICD-10-CM

## 2014-03-06 LAB — COMPREHENSIVE METABOLIC PANEL (CC13)
ALT: 12 U/L (ref 0–55)
ANION GAP: 10 meq/L (ref 3–11)
AST: 13 U/L (ref 5–34)
Albumin: 3.6 g/dL (ref 3.5–5.0)
Alkaline Phosphatase: 70 U/L (ref 40–150)
BUN: 14.4 mg/dL (ref 7.0–26.0)
CO2: 26 meq/L (ref 22–29)
CREATININE: 1.1 mg/dL (ref 0.7–1.3)
Calcium: 9 mg/dL (ref 8.4–10.4)
Chloride: 110 mEq/L — ABNORMAL HIGH (ref 98–109)
Glucose: 101 mg/dl (ref 70–140)
Potassium: 4.2 mEq/L (ref 3.5–5.1)
Sodium: 146 mEq/L — ABNORMAL HIGH (ref 136–145)
Total Bilirubin: 0.33 mg/dL (ref 0.20–1.20)
Total Protein: 6.8 g/dL (ref 6.4–8.3)

## 2014-03-06 LAB — CBC WITH DIFFERENTIAL/PLATELET
BASO%: 1.2 % (ref 0.0–2.0)
Basophils Absolute: 0.1 10*3/uL (ref 0.0–0.1)
EOS%: 1.2 % (ref 0.0–7.0)
Eosinophils Absolute: 0.1 10*3/uL (ref 0.0–0.5)
HEMATOCRIT: 33.2 % — AB (ref 38.4–49.9)
HGB: 10.6 g/dL — ABNORMAL LOW (ref 13.0–17.1)
LYMPH%: 19.7 % (ref 14.0–49.0)
MCH: 27.4 pg (ref 27.2–33.4)
MCHC: 32.1 g/dL (ref 32.0–36.0)
MCV: 85.2 fL (ref 79.3–98.0)
MONO#: 0.5 10*3/uL (ref 0.1–0.9)
MONO%: 7.6 % (ref 0.0–14.0)
NEUT#: 4.4 10*3/uL (ref 1.5–6.5)
NEUT%: 70.3 % (ref 39.0–75.0)
PLATELETS: 262 10*3/uL (ref 140–400)
RBC: 3.89 10*6/uL — ABNORMAL LOW (ref 4.20–5.82)
RDW: 16.8 % — ABNORMAL HIGH (ref 11.0–14.6)
WBC: 6.3 10*3/uL (ref 4.0–10.3)
lymph#: 1.2 10*3/uL (ref 0.9–3.3)

## 2014-03-06 MED ORDER — DOCETAXEL CHEMO INJECTION 160 MG/16ML
75.0000 mg/m2 | Freq: Once | INTRAVENOUS | Status: AC
  Administered 2014-03-06: 170 mg via INTRAVENOUS
  Filled 2014-03-06: qty 17

## 2014-03-06 MED ORDER — ONDANSETRON 8 MG/NS 50 ML IVPB
INTRAVENOUS | Status: AC
  Filled 2014-03-06: qty 8

## 2014-03-06 MED ORDER — ONDANSETRON 8 MG/50ML IVPB (CHCC)
8.0000 mg | Freq: Once | INTRAVENOUS | Status: AC
  Administered 2014-03-06: 8 mg via INTRAVENOUS

## 2014-03-06 MED ORDER — HEPARIN SOD (PORK) LOCK FLUSH 100 UNIT/ML IV SOLN
500.0000 [IU] | Freq: Once | INTRAVENOUS | Status: AC | PRN
  Administered 2014-03-06: 500 [IU]
  Filled 2014-03-06: qty 5

## 2014-03-06 MED ORDER — LEUPROLIDE ACETATE (4 MONTH) 30 MG IM KIT
30.0000 mg | PACK | Freq: Once | INTRAMUSCULAR | Status: AC
  Administered 2014-03-06: 30 mg via INTRAMUSCULAR
  Filled 2014-03-06: qty 30

## 2014-03-06 MED ORDER — DEXAMETHASONE SODIUM PHOSPHATE 10 MG/ML IJ SOLN
INTRAMUSCULAR | Status: AC
  Filled 2014-03-06: qty 1

## 2014-03-06 MED ORDER — SODIUM CHLORIDE 0.9 % IV SOLN
Freq: Once | INTRAVENOUS | Status: AC
  Administered 2014-03-06: 10:00:00 via INTRAVENOUS

## 2014-03-06 MED ORDER — DEXAMETHASONE SODIUM PHOSPHATE 10 MG/ML IJ SOLN
10.0000 mg | Freq: Once | INTRAMUSCULAR | Status: AC
  Administered 2014-03-06: 10 mg via INTRAVENOUS

## 2014-03-06 MED ORDER — SODIUM CHLORIDE 0.9 % IJ SOLN
10.0000 mL | INTRAMUSCULAR | Status: DC | PRN
  Administered 2014-03-06: 10 mL
  Filled 2014-03-06: qty 10

## 2014-03-06 NOTE — Patient Instructions (Signed)
[facility] Discharge Instructions for Patients Receiving Chemotherapy  Today you received the following chemotherapy agent: Taxotere (Docetaxel)  To help prevent nausea and vomiting after your treatment, we encourage you to take your nausea medication as directed:    Zofran 8 mg every 8 hours as needed for nausea   If you develop nausea and vomiting that is not controlled by your nausea medication, call the clinic.   BELOW ARE SYMPTOMS THAT SHOULD BE REPORTED IMMEDIATELY:  *FEVER GREATER THAN 100.5 F  *CHILLS WITH OR WITHOUT FEVER  NAUSEA AND VOMITING THAT IS NOT CONTROLLED WITH YOUR NAUSEA MEDICATION  *UNUSUAL SHORTNESS OF BREATH  *UNUSUAL BRUISING OR BLEEDING  TENDERNESS IN MOUTH AND THROAT WITH OR WITHOUT PRESENCE OF ULCERS  *URINARY PROBLEMS  *BOWEL PROBLEMS  UNUSUAL RASH Items with * indicate a potential emergency and should be followed up as soon as possible.  Feel free to call the clinic should you have any questions or concerns. The clinic phone number is [phone].  It has been a pleasure to serve you today!

## 2014-03-06 NOTE — Progress Notes (Signed)
Hematology and Oncology Follow Up Visit  Yusuf Yu [id number] [birth year] 68 y.o. 03/06/2014 1:33 PM   [name], [name]  [name], [name]  Principle Diagnosis:This is a 68 year old gentleman with prostate cancer initially diagnosed in 2001.  He had a Gleason score of 3 + 3 = 6.  He has castration-resistant disease with pelvic adenopathy.    Prior Therapy:  1. Status post prostatectomy done in May 2001.  He had T3 N1 disease.  PSA nadir to 0.  2. The patient developed biochemical relapse and received prostatic bed radiation.  3. The patient received Lupron with Casodex due to rising PSA.  The patient subsequently developed castration-resistant disease. 4. Patient treated with Casodex withdrawal and subsequently PSA rose to 18. 5. Patient with Provenge immunotherapy completed in July 2011. 6.  He received ketoconazole and prednisone December 2011 through January 2014. 7.         He is S/P Zytiga 1000 mg daily with Prednisone 5 mg daily on 11/03/12 till 07/2013. This was stopped due to progression of disease.  8. Xtandi 1000 mg daily 07/2013 through 01/24/2014. Discontinued secondary to disease progression  Current therapy: 1. Systemic chemotherapy with docetaxal 75 mg/m2 given every 3 weeks with Neulasta support. Status post 1 cycle 2. He is on Lupron 30 mg every 4 months. He is due for an injection in 11/2013 and will be repeated again in 03/2014.   Interim History: [name] presents today accompanied by his wife for a symptom management after completing his first cycle of systemic chemotherapy with docetaxal and neulasta support. Overall he tolerated the chemotherapy and Neulasta without difficulty.  Since his last visit he has been doing very well. He voiced no complaints. He is not reporting any nausea.  He is not reporting any vomiting.  Had not reported any abdominal pain or back pain.  Denies other GI toxicities at this time.  Overall, his performance status  and activity levels continue to be  close to base line.  He continues to perform activities of daily living without any hindrance or decline.  Had not reported any genitourinary complaints or bleeding. His continued to work close to full time.  He has not reported any neurological complaints or decline in his quality of life. Denied seizures or decline in his exercise tolerance. He has not reported any lymphadenopathy or pruritus. He has not reported any petechiae or bleeding. He and his wife are planning a trip to [place], [place] the week of April 15, 2014 and will need to reschedule his chemotherapy to the following week.  Medications: I have reviewed the patient's current medications. Current Outpatient Prescriptions  Medication Sig Dispense Refill  . amLODipine (NORVASC) 5 MG tablet Take 5 mg by mouth every evening.       [name] aspirin 81 MG tablet Take 81 mg by mouth daily.        . calcium carbonate (OS-CAL) 1250 MG chewable tablet Chew 2 tablets by mouth daily.      [name] leuprolide (LUPRON) 30 MG injection Inject 30 mg into the muscle every 4 (four) months.       . lidocaine-prilocaine (EMLA) cream Apply 1 application topically as needed.  30 g  0  . losartan-hydrochlorothiazide (HYZAAR) 100-12.5 MG per tablet Take 1 tablet by mouth every morning.       . ondansetron (ZOFRAN) 8 MG tablet Take 1 tablet (8 mg total) by mouth every 8 (eight) hours as needed for nausea or vomiting.  20 tablet  1   No current facility-administered medications for this visit.   Facility-Administered Medications Ordered in Other Visits  Medication Dose Route Frequency Provider Last Rate Last Dose  . sodium chloride 0.9 % injection 10 mL  10 mL Intracatheter PRN [name], MD   10 mL at 03/06/14 1147    Allergies: No Known Allergies  Past Medical History, Surgical history, Social history, and Family History were reviewed and updated.  Review of Systems:  Remaining ROS negative.  Physical Exam: Blood pressure  122/64, pulse 68, temperature 98.6 F (37 C), temperature source Oral, resp. rate 18, height 5\' 8"  (1.727 m), weight 233 lb 8 oz (105.915 kg). ECOG: 1 General appearance: alert awake appeared in no active distress. Head: Normocephalic, without obvious abnormality, atraumatic Neck: no adenopathy, no carotid bruit, no JVD, supple, symmetrical, trachea midline and thyroid not enlarged, symmetric, no tenderness/mass/nodules Lymph nodes: Cervical, supraclavicular, and axillary nodes normal. Heart:regular rate and rhythm, S1, S2 normal, no murmur, click, rub or gallop Lung:chest clear, no wheezing, rales, normal symmetric air entry, Heart exam - S1, S2 normal, no murmur, no gallop, rate regular Abdomen: soft, non-tender, without masses or organomegaly NID:POEUM edema noted. Right anterior chest porta cath, well healed, non-tender, no evidence of infection  Lab Results: Lab Results  Component Value Date   WBC 6.3 03/06/2014   HGB 10.6* 03/06/2014   HCT 33.2* 03/06/2014   MCV 85.2 03/06/2014   PLT 262 03/06/2014     Chemistry      Component Value Date/Time   NA 146* 03/06/2014 0844   NA 141 02/06/2014 1005   K 4.2 03/06/2014 0844   K 3.7 02/06/2014 1005   CL 104 02/06/2014 1005   CL 108* 03/09/2013 0923   CO2 26 03/06/2014 0844   CO2 26 02/06/2014 1005   BUN 14.4 03/06/2014 0844   BUN 12 02/06/2014 1005   CREATININE 1.1 03/06/2014 0844   CREATININE 0.89 02/06/2014 1005      Component Value Date/Time   CALCIUM 9.0 03/06/2014 0844   CALCIUM 9.4 02/06/2014 1005   ALKPHOS 70 03/06/2014 0844   ALKPHOS 71 12/27/2012 0415   AST 13 03/06/2014 0844   AST 17 12/27/2012 0415   ALT 12 03/06/2014 0844   ALT 24 12/27/2012 0415   BILITOT 0.33 03/06/2014 0844   BILITOT 0.3 12/27/2012 0415       Results for EREN, RYSER (MRN [id number]) as of 01/24/2014 09:26  Ref. Range 12/12/2013 09:19 01/22/2014 10:57  PSA Latest Range: <=4.00 ng/mL 274.50 (H) 359.70 (H)    EXAM:  CT CHEST, ABDOMEN, AND PELVIS WITH  CONTRAST  TECHNIQUE:  Multidetector CT imaging of the chest, abdomen and pelvis was  performed following the standard protocol during bolus  administration of intravenous contrast.  CONTRAST: 161mL OMNIPAQUE IOHEXOL 300 MG/ML SOLN  COMPARISON: Abdomen pelvis CT on 05/15/2013 and chest CT on  08/27/2010  FINDINGS:  CT CHEST FINDINGS  Diffuse goiter with substernal extension into the superior  mediastinum remains stable. No other mediastinal or hilar masses are  identified. No lymphadenopathy seen elsewhere within the thorax.  No evidence of pleural or pericardial effusion. A tiny 4 mm  pulmonary nodule in the anterior left upper lobe on image 17 is  stable since 2011 and consistent with benign etiology. No new or  enlarging pulmonary nodules or masses are identified. No evidence of  pulmonary infiltrate or central endobronchial lesion. No suspicious  bone lesions identified.  CT ABDOMEN AND  PELVIS FINDINGS  Increased upper abdominal lymphadenopathy is seen in the  gastrohepatic ligament, porta hepatis and central small bowel  mesentery. Increased lymphadenopathy is also seen throughout the  abdominal retroperitoneum. Largest area of confluent lymphadenopathy  currently measures 3.4 x 12.6 cm on image 65 compared to 3.1 x 7.4  cm previously there is also increased lymphadenopathy seen within  the bilateral iliac chains and throughout the sigmoid mesentery and  perirectal region in the central pelvis. Index lymphadenopathy in  the left iliac chain now measures 2.4 x 3.1 cm on image 90 compared  to 2.0 x 2.0 cm previously. Penile prosthesis remains in place.  The liver, gallbladder, pancreas, spleen, and adrenal glands are  normal in appearance. A enhancing mass is now seen in the upper pole  of the left kidney which measures 3.4 x 3.8 cm on image 51 and is  consistent with renal cell carcinoma, with renal metastasis  considered much less likely. Other tiny renal cysts are noted   bilaterally, but no other suspicious renal masses are identified. A  right ureteral stent is seen in appropriate position, and there is  no evidence hydronephrosis. No suspicious bone lesions identified.  IMPRESSION:  No evidence of metastatic disease or other acute findings within the  thorax. Stable substernal goiter.  Increased lymphadenopathy throughout the abdomen and pelvis,  consistent with progression of metastatic disease.  3.8 cm enhancing mass now seen in the upper pole of left kidney,  highly suspicious for renal cell carcinoma, with renal metastasis  considered much less likely.     Impression and Plan:  This is a 68 year old gentleman with the following issues: 1. Castration-resistant prostate cancer with pelvic adenopathy. He was started on Xtandi in 07/2013. With PSA down to 160 after one month of therapy now it's up to 359. The CT scan on 01/22/2014 was discussed with the patient extensively at his last visit. He has clear progression by his PSA criteria as well as CT scan results. He is now being treated with systemic chemotherapy in the form of docetaxel 75 mg per meter squared given every 3 weeks with Neulasta support. He is status post one cycle on. He tolerated his chemotherapy and Neulasta injection without any significant adverse effects. He'll proceed with cycle #2 of his systemic chemotherapy with docetaxel with Neulasta port as scheduled today without dose reduction of. His cycle that would be due the week of 04/15/2014 will be rescheduled for 04/24/2014.  2. Neutropenia prophylaxis: He will receive Neulasta after each injection. 3. IV access: Status post right anterior chest Port-A-Cath placement on 02/06/2014. Continue EMLA cream as needed. 4. Hydronephrosis: He is S/P right  nephrostomy tube on 3/14 and a recent exchange on 10/22/2013. 5. Sepsis and thrombocytopenia: Unlikely related to Zytiga or cancer. Resolved now.  6. Hormonal deprivation.  He continues to  be on Lupron. He will receive Lupron today repeated in June of 2015. 7. Hypertension. He is on Norvasc and Hyzaar per PCP.   Followup will be in on 03/27/2014 prior to cycle #3 of Taxotere chemotherapy.    Carlton Adam, PA-C 5/27/20151:33 PM

## 2014-03-07 ENCOUNTER — Encounter: Payer: Self-pay | Admitting: *Deleted

## 2014-03-07 ENCOUNTER — Ambulatory Visit (HOSPITAL_BASED_OUTPATIENT_CLINIC_OR_DEPARTMENT_OTHER): Payer: Medicare Other

## 2014-03-07 VITALS — BP 130/69 | HR 67 | Temp 98.7°F

## 2014-03-07 DIAGNOSIS — Z5189 Encounter for other specified aftercare: Secondary | ICD-10-CM

## 2014-03-07 DIAGNOSIS — C61 Malignant neoplasm of prostate: Secondary | ICD-10-CM

## 2014-03-07 LAB — PSA: PSA: 431.9 ng/mL — ABNORMAL HIGH (ref <=4.00)

## 2014-03-07 MED ORDER — PEGFILGRASTIM INJECTION 6 MG/0.6ML
6.0000 mg | Freq: Once | SUBCUTANEOUS | Status: AC
  Administered 2014-03-07: 6 mg via SUBCUTANEOUS
  Filled 2014-03-07: qty 0.6

## 2014-03-07 NOTE — Patient Instructions (Signed)

## 2014-03-07 NOTE — Patient Instructions (Signed)
Continue labs and chemotherapy as scheduled Follow up in 3 weeks, prior to the start of your next cycle of chemotherapy

## 2014-03-20 ENCOUNTER — Encounter: Payer: Self-pay | Admitting: Oncology

## 2014-03-20 NOTE — Progress Notes (Signed)
No asst for Neulasta- the patient has [employer]

## 2014-03-21 ENCOUNTER — Ambulatory Visit (HOSPITAL_BASED_OUTPATIENT_CLINIC_OR_DEPARTMENT_OTHER): Payer: Medicare Other

## 2014-03-21 ENCOUNTER — Telehealth: Payer: Self-pay | Admitting: Oncology

## 2014-03-21 DIAGNOSIS — N1330 Unspecified hydronephrosis: Secondary | ICD-10-CM

## 2014-03-21 DIAGNOSIS — C61 Malignant neoplasm of prostate: Secondary | ICD-10-CM

## 2014-03-21 LAB — CBC WITH DIFFERENTIAL/PLATELET
BASO%: 0.9 % (ref 0.0–2.0)
Basophils Absolute: 0.1 10*3/uL (ref 0.0–0.1)
EOS%: 0.5 % (ref 0.0–7.0)
Eosinophils Absolute: 0.1 10*3/uL (ref 0.0–0.5)
HEMATOCRIT: 32.9 % — AB (ref 38.4–49.9)
HGB: 10.6 g/dL — ABNORMAL LOW (ref 13.0–17.1)
LYMPH#: 1.8 10*3/uL (ref 0.9–3.3)
LYMPH%: 18.2 % (ref 14.0–49.0)
MCH: 26.9 pg — AB (ref 27.2–33.4)
MCHC: 32.1 g/dL (ref 32.0–36.0)
MCV: 83.8 fL (ref 79.3–98.0)
MONO#: 0.7 10*3/uL (ref 0.1–0.9)
MONO%: 7.4 % (ref 0.0–14.0)
NEUT#: 7.1 10*3/uL — ABNORMAL HIGH (ref 1.5–6.5)
NEUT%: 73 % (ref 39.0–75.0)
Platelets: 161 10*3/uL (ref 140–400)
RBC: 3.92 10*6/uL — ABNORMAL LOW (ref 4.20–5.82)
RDW: 17.6 % — ABNORMAL HIGH (ref 11.0–14.6)
WBC: 9.7 10*3/uL (ref 4.0–10.3)

## 2014-03-21 LAB — COMPREHENSIVE METABOLIC PANEL (CC13)
ALT: 10 U/L (ref 0–55)
AST: 12 U/L (ref 5–34)
Albumin: 3.5 g/dL (ref 3.5–5.0)
Alkaline Phosphatase: 80 U/L (ref 40–150)
Anion Gap: 9 mEq/L (ref 3–11)
BILIRUBIN TOTAL: 0.24 mg/dL (ref 0.20–1.20)
BUN: 14.3 mg/dL (ref 7.0–26.0)
CHLORIDE: 111 meq/L — AB (ref 98–109)
CO2: 26 mEq/L (ref 22–29)
CREATININE: 0.9 mg/dL (ref 0.7–1.3)
Calcium: 8.9 mg/dL (ref 8.4–10.4)
Glucose: 115 mg/dl (ref 70–140)
Potassium: 3.7 mEq/L (ref 3.5–5.1)
Sodium: 146 mEq/L — ABNORMAL HIGH (ref 136–145)
Total Protein: 6.7 g/dL (ref 6.4–8.3)

## 2014-03-21 NOTE — Telephone Encounter (Signed)
pt came in and needed to r/s lab to today...done...pt ok and aware

## 2014-03-22 LAB — PSA: PSA: 491.8 ng/mL — ABNORMAL HIGH (ref <=4.00)

## 2014-03-25 ENCOUNTER — Encounter: Payer: Self-pay | Admitting: Oncology

## 2014-03-25 NOTE — Progress Notes (Signed)
Sent fax to [facility] to advise the patient is no longer on Zytiga.

## 2014-03-27 ENCOUNTER — Other Ambulatory Visit: Payer: Medicare Other

## 2014-03-27 ENCOUNTER — Telehealth: Payer: Self-pay | Admitting: Oncology

## 2014-03-27 ENCOUNTER — Ambulatory Visit (HOSPITAL_BASED_OUTPATIENT_CLINIC_OR_DEPARTMENT_OTHER): Payer: Medicare Other | Admitting: Oncology

## 2014-03-27 ENCOUNTER — Ambulatory Visit (HOSPITAL_BASED_OUTPATIENT_CLINIC_OR_DEPARTMENT_OTHER): Payer: Medicare Other

## 2014-03-27 VITALS — BP 110/58 | HR 74 | Temp 98.5°F | Resp 18 | Ht 68.0 in | Wt 235.3 lb

## 2014-03-27 DIAGNOSIS — N1330 Unspecified hydronephrosis: Secondary | ICD-10-CM

## 2014-03-27 DIAGNOSIS — E291 Testicular hypofunction: Secondary | ICD-10-CM

## 2014-03-27 DIAGNOSIS — C61 Malignant neoplasm of prostate: Secondary | ICD-10-CM

## 2014-03-27 DIAGNOSIS — Z5111 Encounter for antineoplastic chemotherapy: Secondary | ICD-10-CM

## 2014-03-27 MED ORDER — HEPARIN SOD (PORK) LOCK FLUSH 100 UNIT/ML IV SOLN
500.0000 [IU] | Freq: Once | INTRAVENOUS | Status: AC | PRN
  Administered 2014-03-27: 500 [IU]
  Filled 2014-03-27: qty 5

## 2014-03-27 MED ORDER — DOCETAXEL CHEMO INJECTION 160 MG/16ML
75.0000 mg/m2 | Freq: Once | INTRAVENOUS | Status: AC
  Administered 2014-03-27: 170 mg via INTRAVENOUS
  Filled 2014-03-27: qty 17

## 2014-03-27 MED ORDER — ONDANSETRON 8 MG/50ML IVPB (CHCC)
8.0000 mg | Freq: Once | INTRAVENOUS | Status: AC
  Administered 2014-03-27: 8 mg via INTRAVENOUS

## 2014-03-27 MED ORDER — SODIUM CHLORIDE 0.9 % IJ SOLN
10.0000 mL | INTRAMUSCULAR | Status: DC | PRN
  Administered 2014-03-27: 10 mL
  Filled 2014-03-27: qty 10

## 2014-03-27 MED ORDER — ONDANSETRON 8 MG/NS 50 ML IVPB
INTRAVENOUS | Status: AC
  Filled 2014-03-27: qty 8

## 2014-03-27 MED ORDER — SODIUM CHLORIDE 0.9 % IV SOLN
Freq: Once | INTRAVENOUS | Status: AC
  Administered 2014-03-27: 10:00:00 via INTRAVENOUS

## 2014-03-27 MED ORDER — DEXAMETHASONE SODIUM PHOSPHATE 10 MG/ML IJ SOLN
INTRAMUSCULAR | Status: AC
  Filled 2014-03-27: qty 1

## 2014-03-27 MED ORDER — DEXAMETHASONE SODIUM PHOSPHATE 10 MG/ML IJ SOLN
10.0000 mg | Freq: Once | INTRAMUSCULAR | Status: AC
  Administered 2014-03-27: 10 mg via INTRAVENOUS

## 2014-03-27 NOTE — Telephone Encounter (Signed)
gv adn printed appt sched and avs for pt for June thru Aug....sed added tx. °

## 2014-03-27 NOTE — Progress Notes (Signed)
Hematology and Oncology Follow Up Visit  [name] [id number] 09-15-1946 68 y.o. 03/27/2014 9:51 AM   [name], [name]  [name]. [name], [name]  Principle Diagnosis:This is a 68 year old gentleman with prostate cancer initially diagnosed in 2001.  He had a Gleason score of 3 + 3 = 6.  He has castration-resistant disease with pelvic adenopathy   Prior Therapy:  1. Status post prostatectomy done in May 2001.  He had T3 N1 disease.  PSA nadir to 0.  2. The patient developed biochemical relapse and received prostatic bed radiation.  3. The patient received Lupron with Casodex due to rising PSA.  The patient subsequently developed castration-resistant disease. 4. Patient treated with Casodex withdrawal and subsequently PSA rose to 18. 5. Patient with Provenge immunotherapy completed in July 2011. 6.  He received ketoconazole and prednisone December 2011 through January 2014. 7.         He is S/P Zytiga 1000 mg daily with Prednisone 5 mg daily on 11/03/12 till 07/2013. This was stopped due to progression of disease.  8.  Xtandi 1000 mg daily 07/2013 through 01/24/2014. Discontinued secondary to disease progression  Current therapy: 1. Systemic chemotherapy with docetaxal 75 mg/m2 given every 3 weeks with Neulasta support. Status post 2 cycle 2. He is on Lupron 30 mg every 4 months. He is due for an injection in 03/2014 and will be repeated again in 04/2014.   Interim History: [name] presents today accompanied by his wife for a symptom management before receiving 3rd cycle of systemic chemotherapy with docetaxal and neulasta support. Overall he tolerated his 1st and 2nd chemotherapy and Neulasta without difficulty.  Since his last visit he has been doing very well. He voiced no complaints. He is not reporting any nausea.  He is not reporting any vomiting.  Has not reported any back pain but does report some burning pain in his right side since stent placement.  Denies any GI  toxicities at this time.  Overall, his performance status and activity levels continue to be close to base line.  He continues to perform activities of daily living without any hindrance or decline.  Had not reported any genitourinary complaints or bleeding. He has continued to work close to full time.  He has not reported any neurological complaints or decline in his quality of life. Denied seizures or decline in his exercise tolerance. He has not reported any lymphadenopathy or pruritus. He has not reported any petechiae or bleeding. He and his wife are planning a trip to [place], [place] the week of April 15, 2014 and will need to reschedule his chemotherapy to the following week. Rest of his review of systems unremarkable.    Medications: I have reviewed the patient's current medications. Current Outpatient Prescriptions  Medication Sig Dispense Refill  . amLODipine (NORVASC) 5 MG tablet Take 5 mg by mouth every evening.       [name] aspirin 81 MG tablet Take 81 mg by mouth daily.        . calcium carbonate (OS-CAL) 1250 MG chewable tablet Chew 2 tablets by mouth daily.      [name] leuprolide (LUPRON) 30 MG injection Inject 30 mg into the muscle every 4 (four) months.       . lidocaine-prilocaine (EMLA) cream Apply 1 application topically as needed.  30 g  0  . losartan-hydrochlorothiazide (HYZAAR) 100-12.5 MG per tablet Take 1 tablet by mouth every morning.       . ondansetron (ZOFRAN) 8  MG tablet Take 1 tablet (8 mg total) by mouth every 8 (eight) hours as needed for nausea or vomiting.  20 tablet  1   No current facility-administered medications for this visit.    Allergies: No Known Allergies  Past Medical History, Surgical history, Social history, and Family History were reviewed and updated.    Physical Exam: Blood pressure 110/58, pulse 74, temperature 98.5 F (36.9 C), temperature source Oral, resp. rate 18, height 5\' 8"  (1.727 m), weight 235 lb 4.8 oz (106.731 kg). ECOG: 1 General  appearance: alert awake appeared in no active distress. Head: Normocephalic, without obvious abnormality, atraumatic Neck: no adenopathy, no carotid bruit, no JVD, supple, symmetrical, trachea midline and thyroid not enlarged, symmetric, no tenderness/mass/nodules Lymph nodes: Cervical, supraclavicular, and axillary nodes normal. Heart:regular rate and rhythm, S1, S2 normal, no murmur, click, rub or gallop Lung:chest clear, no wheezing, rales, normal symmetric air entry, Heart exam - S1, S2 normal, no murmur, no gallop, rate regular Abdomen: soft, non-tender, without masses or organomegaly KXF:[id number] edema noted. Right anterior chest porta cath, well healed, non-tender, no evidence of infection  Lab Results: Lab Results  Component Value Date   WBC 9.7 03/21/2014   HGB 10.6* 03/21/2014   HCT 32.9* 03/21/2014   MCV 83.8 03/21/2014   PLT 161 03/21/2014     Chemistry      Component Value Date/Time   NA 146* 03/21/2014 0926   NA 141 02/06/2014 1005   K 3.7 03/21/2014 0926   K 3.7 02/06/2014 1005   CL 104 02/06/2014 1005   CL 108* 03/09/2013 0923   CO2 26 03/21/2014 0926   CO2 26 02/06/2014 1005   BUN 14.3 03/21/2014 0926   BUN 12 02/06/2014 1005   CREATININE 0.9 03/21/2014 0926   CREATININE 0.89 02/06/2014 1005      Component Value Date/Time   CALCIUM 8.9 03/21/2014 0926   CALCIUM 9.4 02/06/2014 1005   ALKPHOS 80 03/21/2014 0926   ALKPHOS 71 12/27/2012 0415   AST 12 03/21/2014 0926   AST 17 12/27/2012 0415   ALT 10 03/21/2014 0926   ALT 24 12/27/2012 0415   BILITOT 0.24 03/21/2014 0926   BILITOT 0.3 12/27/2012 0415     Results for [name], [name] (MRN [id number]) as of 03/27/2014 10:09  Ref. Range 01/22/2014 10:57 03/06/2014 08:44 03/21/2014 09:26  PSA Latest Range: <=4.00 ng/mL 359.70 (H) 431.90 (H) 491.80 (H)     Impression and Plan:  This is a 68 year old gentleman with the following issues: 1. Castration-resistant prostate cancer with pelvic adenopathy. He was started on Xtandi in 07/2013.  With PSA down to 160 after one month of therapy now it's up to 359. The CT scan on 01/22/2014 was discussed with the patient extensively at his last visit. He has clear progression by his PSA criteria as well as CT scan results. He is now being treated with systemic chemotherapy in the form of docetaxel 75 mg per meter squared given every 3 weeks with Neulasta support. He is status post one cycle on. He tolerated his chemotherapy and Neulasta injection without any significant adverse effects. The patient's PSA has continued to rise. He will proceed with cycle #3 of his systemic chemotherapy with docetaxel with Neulasta port as scheduled today without dose reduction. We will re-evaluate on July 15th before 4th cycle and decide then whether treatment will change or remain the same. The patient will be out of town so his cycle that would be due the week of 04/15/2014 will  be rescheduled for 04/24/2014.  2. Neutropenia prophylaxis: He will receive Neulasta injection after chemo. 3. IV access: Status post right anterior chest Port-A-Cath placement on 02/06/2014. Continue EMLA cream as needed. 4. Hydronephrosis: He is S/P right  nephrostomy tube on 3/14 and a recent exchange on 10/22/2013. Will continue to monitor burning in right side from stent placement. 5. Sepsis and thrombocytopenia: Unlikely related to Zytiga or cancer. Resolved now.  6. Hormonal deprivation.  He continues to be on Lupron. He will receive Lupron today and will repeat in July of 2015. 7. Hypertension. He is on Norvasc and Hyzaar per PCP.   Followup will be in on 04/24/2014 prior to cycle #4 of Taxotere chemotherapy.    [facility] M,FNP-BC  6/17/20159:51 AM   Patient seen and examined. He is not reporting any new complications related to systemic chemotherapy. He continues to have burning sensation were the stent has been placed.  Physical examination, is awake alert not in any distress. Heart is regular rate rhythm S1-S2. Lungs  clear auscultation abdomen soft nontender without any splenomegaly. Extremities no edema.  Laboratory data reviewed including the rise in his PSA.  Impression and plan: Pleasant 68 year old gentleman with castration resistant metastatic disease. The plan is to continue his systemic chemotherapy for the time being and monitor his PSA closely. If his PSA continued to rise rapidly we will switch him to a different salvage regimen. For the time being, we'll continue with Taxotere. She will also receive Neulasta for neutropenic percussion. All his questions are answered today to his satisfaction.  [facility] MD 03/27/2014

## 2014-03-27 NOTE — Patient Instructions (Signed)
[facility] Discharge Instructions for Patients Receiving Chemotherapy  Today you received the following chemotherapy agent: Taxotere   To help prevent nausea and vomiting after your treatment, we encourage you to take your nausea medication as prescribed.    If you develop nausea and vomiting that is not controlled by your nausea medication, call the clinic.   BELOW ARE SYMPTOMS THAT SHOULD BE REPORTED IMMEDIATELY:  *FEVER GREATER THAN 100.5 F  *CHILLS WITH OR WITHOUT FEVER  NAUSEA AND VOMITING THAT IS NOT CONTROLLED WITH YOUR NAUSEA MEDICATION  *UNUSUAL SHORTNESS OF BREATH  *UNUSUAL BRUISING OR BLEEDING  TENDERNESS IN MOUTH AND THROAT WITH OR WITHOUT PRESENCE OF ULCERS  *URINARY PROBLEMS  *BOWEL PROBLEMS  UNUSUAL RASH Items with * indicate a potential emergency and should be followed up as soon as possible.  Feel free to call the clinic you have any questions or concerns. The clinic phone number is [phone].    

## 2014-03-28 ENCOUNTER — Ambulatory Visit (HOSPITAL_BASED_OUTPATIENT_CLINIC_OR_DEPARTMENT_OTHER): Payer: Medicare Other

## 2014-03-28 VITALS — BP 130/72 | HR 74 | Temp 98.6°F

## 2014-03-28 DIAGNOSIS — Z5189 Encounter for other specified aftercare: Secondary | ICD-10-CM

## 2014-03-28 DIAGNOSIS — C61 Malignant neoplasm of prostate: Secondary | ICD-10-CM

## 2014-03-28 MED ORDER — PEGFILGRASTIM INJECTION 6 MG/0.6ML
6.0000 mg | Freq: Once | SUBCUTANEOUS | Status: AC
  Administered 2014-03-28: 6 mg via SUBCUTANEOUS
  Filled 2014-03-28: qty 0.6

## 2014-04-01 ENCOUNTER — Other Ambulatory Visit: Payer: Self-pay | Admitting: Oncology

## 2014-04-22 ENCOUNTER — Other Ambulatory Visit (HOSPITAL_BASED_OUTPATIENT_CLINIC_OR_DEPARTMENT_OTHER): Payer: Medicare Other

## 2014-04-22 DIAGNOSIS — C61 Malignant neoplasm of prostate: Secondary | ICD-10-CM

## 2014-04-22 LAB — COMPREHENSIVE METABOLIC PANEL (CC13)
ALBUMIN: 3.4 g/dL — AB (ref 3.5–5.0)
ALT: 12 U/L (ref 0–55)
AST: 12 U/L (ref 5–34)
Alkaline Phosphatase: 65 U/L (ref 40–150)
Anion Gap: 9 mEq/L (ref 3–11)
BUN: 13.4 mg/dL (ref 7.0–26.0)
CALCIUM: 9.1 mg/dL (ref 8.4–10.4)
CHLORIDE: 110 meq/L — AB (ref 98–109)
CO2: 25 meq/L (ref 22–29)
Creatinine: 0.9 mg/dL (ref 0.7–1.3)
Glucose: 116 mg/dl (ref 70–140)
Potassium: 4.5 mEq/L (ref 3.5–5.1)
Sodium: 144 mEq/L (ref 136–145)
Total Bilirubin: 0.45 mg/dL (ref 0.20–1.20)
Total Protein: 6.6 g/dL (ref 6.4–8.3)

## 2014-04-22 LAB — CBC WITH DIFFERENTIAL/PLATELET
BASO%: 0.6 % (ref 0.0–2.0)
Basophils Absolute: 0 10*3/uL (ref 0.0–0.1)
EOS%: 11.3 % — AB (ref 0.0–7.0)
Eosinophils Absolute: 0.7 10*3/uL — ABNORMAL HIGH (ref 0.0–0.5)
HCT: 31.7 % — ABNORMAL LOW (ref 38.4–49.9)
HEMOGLOBIN: 9.9 g/dL — AB (ref 13.0–17.1)
LYMPH%: 19.1 % (ref 14.0–49.0)
MCH: 26.6 pg — AB (ref 27.2–33.4)
MCHC: 31.2 g/dL — ABNORMAL LOW (ref 32.0–36.0)
MCV: 85.2 fL (ref 79.3–98.0)
MONO#: 0.5 10*3/uL (ref 0.1–0.9)
MONO%: 8.1 % (ref 0.0–14.0)
NEUT#: 3.8 10*3/uL (ref 1.5–6.5)
NEUT%: 60.9 % (ref 39.0–75.0)
Platelets: 220 10*3/uL (ref 140–400)
RBC: 3.72 10*6/uL — ABNORMAL LOW (ref 4.20–5.82)
RDW: 19 % — AB (ref 11.0–14.6)
WBC: 6.3 10*3/uL (ref 4.0–10.3)
lymph#: 1.2 10*3/uL (ref 0.9–3.3)

## 2014-04-23 LAB — PSA: PSA: 484.8 ng/mL — ABNORMAL HIGH (ref <=4.00)

## 2014-04-24 ENCOUNTER — Ambulatory Visit: Payer: Medicare Other

## 2014-04-24 ENCOUNTER — Telehealth: Payer: Self-pay | Admitting: Oncology

## 2014-04-24 ENCOUNTER — Ambulatory Visit (HOSPITAL_BASED_OUTPATIENT_CLINIC_OR_DEPARTMENT_OTHER): Payer: Medicare Other

## 2014-04-24 ENCOUNTER — Ambulatory Visit (HOSPITAL_BASED_OUTPATIENT_CLINIC_OR_DEPARTMENT_OTHER): Payer: Medicare Other | Admitting: Physician Assistant

## 2014-04-24 ENCOUNTER — Other Ambulatory Visit: Payer: Medicare Other

## 2014-04-24 VITALS — BP 132/61 | HR 73 | Temp 98.2°F | Resp 19 | Ht 68.0 in | Wt 238.1 lb

## 2014-04-24 DIAGNOSIS — I959 Hypotension, unspecified: Secondary | ICD-10-CM

## 2014-04-24 DIAGNOSIS — C61 Malignant neoplasm of prostate: Secondary | ICD-10-CM

## 2014-04-24 DIAGNOSIS — Z5111 Encounter for antineoplastic chemotherapy: Secondary | ICD-10-CM

## 2014-04-24 DIAGNOSIS — E291 Testicular hypofunction: Secondary | ICD-10-CM

## 2014-04-24 MED ORDER — SODIUM CHLORIDE 0.9 % IV SOLN
Freq: Once | INTRAVENOUS | Status: AC
  Administered 2014-04-24: 10:00:00 via INTRAVENOUS

## 2014-04-24 MED ORDER — DOCETAXEL CHEMO INJECTION 160 MG/16ML
75.0000 mg/m2 | Freq: Once | INTRAVENOUS | Status: AC
  Administered 2014-04-24: 170 mg via INTRAVENOUS
  Filled 2014-04-24: qty 17

## 2014-04-24 MED ORDER — ONDANSETRON 8 MG/NS 50 ML IVPB
INTRAVENOUS | Status: AC
  Filled 2014-04-24: qty 8

## 2014-04-24 MED ORDER — DEXAMETHASONE SODIUM PHOSPHATE 10 MG/ML IJ SOLN
INTRAMUSCULAR | Status: AC
  Filled 2014-04-24: qty 1

## 2014-04-24 MED ORDER — DEXAMETHASONE SODIUM PHOSPHATE 10 MG/ML IJ SOLN
10.0000 mg | Freq: Once | INTRAMUSCULAR | Status: AC
  Administered 2014-04-24: 10 mg via INTRAVENOUS

## 2014-04-24 MED ORDER — SODIUM CHLORIDE 0.9 % IJ SOLN
10.0000 mL | INTRAMUSCULAR | Status: DC | PRN
  Administered 2014-04-24: 10 mL
  Filled 2014-04-24: qty 10

## 2014-04-24 MED ORDER — HEPARIN SOD (PORK) LOCK FLUSH 100 UNIT/ML IV SOLN
500.0000 [IU] | Freq: Once | INTRAVENOUS | Status: AC | PRN
  Administered 2014-04-24: 500 [IU]
  Filled 2014-04-24: qty 5

## 2014-04-24 MED ORDER — ONDANSETRON 8 MG/50ML IVPB (CHCC)
8.0000 mg | Freq: Once | INTRAVENOUS | Status: AC
  Administered 2014-04-24: 8 mg via INTRAVENOUS

## 2014-04-24 NOTE — Patient Instructions (Signed)
[facility] Discharge Instructions for Patients Receiving Chemotherapy  Today you received the following chemotherapy agents: Taxotere  To help prevent nausea and vomiting after your treatment, we encourage you to take your nausea medication as prescribed.    If you develop nausea and vomiting that is not controlled by your nausea medication, call the clinic.   BELOW ARE SYMPTOMS THAT SHOULD BE REPORTED IMMEDIATELY:  *FEVER GREATER THAN 100.5 F  *CHILLS WITH OR WITHOUT FEVER  NAUSEA AND VOMITING THAT IS NOT CONTROLLED WITH YOUR NAUSEA MEDICATION  *UNUSUAL SHORTNESS OF BREATH  *UNUSUAL BRUISING OR BLEEDING  TENDERNESS IN MOUTH AND THROAT WITH OR WITHOUT PRESENCE OF ULCERS  *URINARY PROBLEMS  *BOWEL PROBLEMS  UNUSUAL RASH Items with * indicate a potential emergency and should be followed up as soon as possible.  Feel free to call the clinic you have any questions or concerns. The clinic phone number is [phone].    

## 2014-04-24 NOTE — Telephone Encounter (Signed)
pt needed to r/s appt done...gv and printed appt sched and avs to pt

## 2014-04-25 ENCOUNTER — Ambulatory Visit (HOSPITAL_BASED_OUTPATIENT_CLINIC_OR_DEPARTMENT_OTHER): Payer: Medicare Other

## 2014-04-25 VITALS — BP 132/72 | HR 66 | Temp 98.7°F

## 2014-04-25 DIAGNOSIS — C61 Malignant neoplasm of prostate: Secondary | ICD-10-CM

## 2014-04-25 DIAGNOSIS — Z5189 Encounter for other specified aftercare: Secondary | ICD-10-CM

## 2014-04-25 MED ORDER — PEGFILGRASTIM INJECTION 6 MG/0.6ML
6.0000 mg | Freq: Once | SUBCUTANEOUS | Status: AC
  Administered 2014-04-25: 6 mg via SUBCUTANEOUS
  Filled 2014-04-25: qty 0.6

## 2014-04-29 NOTE — Progress Notes (Signed)
Hematology and Oncology Follow Up Visit  [name] [id number] [birth year] 68 y.o. 04/29/2014 9:05 AM   [name], [name]  [name] [name], [name]  Principle Diagnosis:This is a 68 year old gentleman with prostate cancer initially diagnosed in 2001.  He had a Gleason score of 3 + 3 = 6.  He has castration-resistant disease with pelvic adenopathy   Prior Therapy:  1. Status post prostatectomy done in May 2001.  He had T3 N1 disease.  PSA nadir to 0.  2. The patient developed biochemical relapse and received prostatic bed radiation.  3. The patient received Lupron with Casodex due to rising PSA.  The patient subsequently developed castration-resistant disease. 4. Patient treated with Casodex withdrawal and subsequently PSA rose to 18. 5. Patient with Provenge immunotherapy completed in July 2011. 6.  He received ketoconazole and prednisone December 2011 through January 2014. 7.         He is S/P Zytiga 1000 mg daily with Prednisone 5 mg daily on 11/03/12 till 07/2013. This was stopped due to progression of disease.  8.  Xtandi 1000 mg daily 07/2013 through 01/24/2014. Discontinued secondary to disease progression  Current therapy: 1. Systemic chemotherapy with docetaxal 75 mg/m2 given every 3 weeks with Neulasta support. Status post 2 cycle 2. He is on Lupron 30 mg every 4 months. He is due for an injection in 03/2014 and will be repeated again in 07/2014.   Interim History: [name] presents today unaccompanied for a symptom management before receiving cycle #4 of systemic chemotherapy with docetaxal and neulasta support. Overall he is tolerating his chemotherapy and Neulasta without difficulty.  Since his last visit he has been doing very well. He voiced no complaints. He is not reporting any nausea.  He is not reporting any vomiting.  Has not reported any back pain but does report some burning pain in his right side since stent placement.  Denies any GI toxicities at this  time.  Overall, his performance status and activity levels continue to be close to base line.  He continues to perform activities of daily living without any hindrance or decline.  Had not reported any genitourinary complaints or bleeding. He has continued to work close to full time.  He has not reported any neurological complaints or decline in his quality of life. Denied seizures or decline in his exercise tolerance. He has not reported any lymphadenopathy or pruritus. He has not reported any petechiae or bleeding.  Remainder of his review of systems unremarkable.    Medications: I have reviewed the patient's current medications. Current Outpatient Prescriptions  Medication Sig Dispense Refill  . amLODipine (NORVASC) 5 MG tablet Take 5 mg by mouth every evening.       [name] aspirin 81 MG tablet Take 81 mg by mouth daily.        . calcium carbonate (OS-CAL) 1250 MG chewable tablet Chew 2 tablets by mouth daily.      [name] leuprolide (LUPRON) 30 MG injection Inject 30 mg into the muscle every 4 (four) months.       . lidocaine-prilocaine (EMLA) cream APPLY 1 APPLICATION TOPICALLY AS NEEDED  1 g  0  . losartan-hydrochlorothiazide (HYZAAR) 100-12.5 MG per tablet Take 1 tablet by mouth every morning.       . ondansetron (ZOFRAN) 8 MG tablet Take 1 tablet (8 mg total) by mouth every 8 (eight) hours as needed for nausea or vomiting.  20 tablet  1   No current facility-administered medications  for this visit.    Allergies: No Known Allergies  Past Medical History, Surgical history, Social history, and Family History were reviewed and updated.    Physical Exam: Blood pressure 132/61, pulse 73, temperature 98.2 F (36.8 C), temperature source Oral, resp. rate 19, height 5\' 8"  (1.727 m), weight 238 lb 1.6 oz (108.001 kg). ECOG: 1 General appearance: alert awake appeared in no active distress. Head: Normocephalic, without obvious abnormality, atraumatic Neck: no adenopathy, no carotid bruit, no JVD,  supple, symmetrical, trachea midline and thyroid not enlarged, symmetric, no tenderness/mass/nodules Lymph nodes: Cervical, supraclavicular, and axillary nodes normal. Heart:regular rate and rhythm, S1, S2 normal, no murmur, click, rub or gallop Lung:chest clear, no wheezing, rales, normal symmetric air entry, Heart exam - S1, S2 normal, no murmur, no gallop, rate regular Abdomen: soft, non-tender, without masses or organomegaly IRJ:JOACZ edema noted. Right anterior chest porta cath, well healed, non-tender, no evidence of infection  Lab Results: Lab Results  Component Value Date   WBC 6.3 04/22/2014   HGB 9.9* 04/22/2014   HCT 31.7* 04/22/2014   MCV 85.2 04/22/2014   PLT 220 04/22/2014     Chemistry      Component Value Date/Time   NA 144 04/22/2014 0940   NA 141 02/06/2014 1005   K 4.5 04/22/2014 0940   K 3.7 02/06/2014 1005   CL 104 02/06/2014 1005   CL 108* 03/09/2013 0923   CO2 25 04/22/2014 0940   CO2 26 02/06/2014 1005   BUN 13.4 04/22/2014 0940   BUN 12 02/06/2014 1005   CREATININE 0.9 04/22/2014 0940   CREATININE 0.89 02/06/2014 1005      Component Value Date/Time   CALCIUM 9.1 04/22/2014 0940   CALCIUM 9.4 02/06/2014 1005   ALKPHOS 65 04/22/2014 0940   ALKPHOS 71 12/27/2012 0415   AST 12 04/22/2014 0940   AST 17 12/27/2012 0415   ALT 12 04/22/2014 0940   ALT 24 12/27/2012 0415   BILITOT 0.45 04/22/2014 0940   BILITOT 0.3 12/27/2012 0415     Results for [name] (MRN [id number]) as of 03/27/2014 10:09  Ref. Range 01/22/2014 10:57 03/06/2014 08:44 03/21/2014 09:26  PSA Latest Range: <=4.00 ng/mL 359.70 (H) 431.90 (H) 491.80 (H)     Impression and Plan:  This is a 68 year old gentleman with the following issues: 1. Castration-resistant prostate cancer with pelvic adenopathy. He was started on Xtandi in 07/2013. With PSA down to 160 after one month of therapy now it's up to 359. The CT scan on 01/22/2014 was discussed with the patient extensively at his last visit. He has clear  progression by his PSA criteria as well as CT scan results. He is now being treated with systemic chemotherapy in the form of docetaxel 75 mg per meter squared given every 3 weeks with Neulasta support. He is status post three cycles.  He tolerated his chemotherapy and Neulasta injection without any significant adverse effects. The patient's PSA has decreased slightly to 484.80 today. He will proceed with cycle #4 of his systemic chemotherapy with docetaxel with Neulasta port as scheduled today without dose reduction.  2. Neutropenia prophylaxis: He will receive Neulasta injection after chemo. 3. IV access: Status post right anterior chest Port-A-Cath placement on 02/06/2014. Continue EMLA cream as needed. 4. Hydronephrosis: He is S/P right  nephrostomy tube on 3/14 and a recent exchange on 10/22/2013. Will continue to monitor burning in right side from stent placement. 5. Sepsis and thrombocytopenia: Unlikely related to Zytiga or cancer. Resolved now.  6. Hormonal deprivation.  He continues to be on Lupron. He will receive Lupron today and will repeat in July of 2015 if he has resumed monthly treatment, otherwise he will receive Lupron next in October 2015. 7. Hypertension. He is on Norvasc and Hyzaar per PCP.   Followup will be in on 05/15/2014 prior to cycle #5 of Taxotere chemotherapy.    Wynetta Emery, Keidra Withers E,PA-C  7/20/20159:05 AM

## 2014-04-29 NOTE — Patient Instructions (Signed)
Follow up in 3 weeks, prior to your next scheduled cycle of chemoetherapy

## 2014-05-10 ENCOUNTER — Other Ambulatory Visit (HOSPITAL_BASED_OUTPATIENT_CLINIC_OR_DEPARTMENT_OTHER): Payer: Medicare Other

## 2014-05-10 DIAGNOSIS — C61 Malignant neoplasm of prostate: Secondary | ICD-10-CM

## 2014-05-10 LAB — COMPREHENSIVE METABOLIC PANEL (CC13)
ALK PHOS: 69 U/L (ref 40–150)
ALT: 8 U/L (ref 0–55)
AST: 11 U/L (ref 5–34)
Albumin: 3.4 g/dL — ABNORMAL LOW (ref 3.5–5.0)
Anion Gap: 10 mEq/L (ref 3–11)
BILIRUBIN TOTAL: 0.32 mg/dL (ref 0.20–1.20)
BUN: 15.2 mg/dL (ref 7.0–26.0)
CO2: 26 mEq/L (ref 22–29)
Calcium: 9.3 mg/dL (ref 8.4–10.4)
Chloride: 107 mEq/L (ref 98–109)
Creatinine: 0.9 mg/dL (ref 0.7–1.3)
Glucose: 116 mg/dl (ref 70–140)
Potassium: 4 mEq/L (ref 3.5–5.1)
SODIUM: 143 meq/L (ref 136–145)
Total Protein: 7.1 g/dL (ref 6.4–8.3)

## 2014-05-10 LAB — CBC WITH DIFFERENTIAL/PLATELET
BASO%: 1 % (ref 0.0–2.0)
BASOS ABS: 0.1 10*3/uL (ref 0.0–0.1)
EOS ABS: 0 10*3/uL (ref 0.0–0.5)
EOS%: 0.5 % (ref 0.0–7.0)
HCT: 31.8 % — ABNORMAL LOW (ref 38.4–49.9)
HGB: 10 g/dL — ABNORMAL LOW (ref 13.0–17.1)
LYMPH%: 22 % (ref 14.0–49.0)
MCH: 26.4 pg — ABNORMAL LOW (ref 27.2–33.4)
MCHC: 31.4 g/dL — ABNORMAL LOW (ref 32.0–36.0)
MCV: 84 fL (ref 79.3–98.0)
MONO#: 0.6 10*3/uL (ref 0.1–0.9)
MONO%: 8.7 % (ref 0.0–14.0)
NEUT%: 67.8 % (ref 39.0–75.0)
NEUTROS ABS: 4.4 10*3/uL (ref 1.5–6.5)
PLATELETS: 236 10*3/uL (ref 140–400)
RBC: 3.79 10*6/uL — AB (ref 4.20–5.82)
RDW: 18.5 % — ABNORMAL HIGH (ref 11.0–14.6)
WBC: 6.5 10*3/uL (ref 4.0–10.3)
lymph#: 1.4 10*3/uL (ref 0.9–3.3)

## 2014-05-11 LAB — PSA: PSA: 638.6 ng/mL — ABNORMAL HIGH (ref <=4.00)

## 2014-05-13 ENCOUNTER — Other Ambulatory Visit: Payer: Medicare Other

## 2014-05-14 ENCOUNTER — Other Ambulatory Visit: Payer: Self-pay

## 2014-05-15 ENCOUNTER — Encounter: Payer: Self-pay | Admitting: Oncology

## 2014-05-15 ENCOUNTER — Ambulatory Visit (HOSPITAL_BASED_OUTPATIENT_CLINIC_OR_DEPARTMENT_OTHER): Payer: Medicare Other | Admitting: Oncology

## 2014-05-15 ENCOUNTER — Telehealth: Payer: Self-pay | Admitting: Oncology

## 2014-05-15 ENCOUNTER — Ambulatory Visit (HOSPITAL_BASED_OUTPATIENT_CLINIC_OR_DEPARTMENT_OTHER): Payer: Medicare Other

## 2014-05-15 ENCOUNTER — Other Ambulatory Visit: Payer: Medicare Other

## 2014-05-15 VITALS — BP 115/60 | HR 74 | Temp 98.6°F | Resp 18 | Ht 68.0 in | Wt 235.6 lb

## 2014-05-15 DIAGNOSIS — C61 Malignant neoplasm of prostate: Secondary | ICD-10-CM

## 2014-05-15 DIAGNOSIS — N289 Disorder of kidney and ureter, unspecified: Secondary | ICD-10-CM

## 2014-05-15 DIAGNOSIS — N1330 Unspecified hydronephrosis: Secondary | ICD-10-CM

## 2014-05-15 DIAGNOSIS — E291 Testicular hypofunction: Secondary | ICD-10-CM

## 2014-05-15 DIAGNOSIS — Z5111 Encounter for antineoplastic chemotherapy: Secondary | ICD-10-CM

## 2014-05-15 MED ORDER — DEXAMETHASONE SODIUM PHOSPHATE 10 MG/ML IJ SOLN
10.0000 mg | Freq: Once | INTRAMUSCULAR | Status: AC
  Administered 2014-05-15: 10 mg via INTRAVENOUS

## 2014-05-15 MED ORDER — DOCETAXEL CHEMO INJECTION 160 MG/16ML
75.0000 mg/m2 | Freq: Once | INTRAVENOUS | Status: AC
  Administered 2014-05-15: 170 mg via INTRAVENOUS
  Filled 2014-05-15: qty 17

## 2014-05-15 MED ORDER — ONDANSETRON 8 MG/50ML IVPB (CHCC)
8.0000 mg | Freq: Once | INTRAVENOUS | Status: AC
  Administered 2014-05-15: 8 mg via INTRAVENOUS

## 2014-05-15 MED ORDER — DEXAMETHASONE SODIUM PHOSPHATE 10 MG/ML IJ SOLN
INTRAMUSCULAR | Status: AC
  Filled 2014-05-15: qty 1

## 2014-05-15 MED ORDER — SODIUM CHLORIDE 0.9 % IJ SOLN
10.0000 mL | INTRAMUSCULAR | Status: DC | PRN
  Administered 2014-05-15: 10 mL
  Filled 2014-05-15: qty 10

## 2014-05-15 MED ORDER — HEPARIN SOD (PORK) LOCK FLUSH 100 UNIT/ML IV SOLN
500.0000 [IU] | Freq: Once | INTRAVENOUS | Status: AC | PRN
  Administered 2014-05-15: 500 [IU]
  Filled 2014-05-15: qty 5

## 2014-05-15 MED ORDER — SODIUM CHLORIDE 0.9 % IV SOLN
Freq: Once | INTRAVENOUS | Status: AC
  Administered 2014-05-15: 11:00:00 via INTRAVENOUS

## 2014-05-15 MED ORDER — ONDANSETRON 8 MG/NS 50 ML IVPB
INTRAVENOUS | Status: AC
  Filled 2014-05-15: qty 8

## 2014-05-15 NOTE — Telephone Encounter (Signed)
gv pt appt schedule for aug. central will call w/ct - pt aware.  °

## 2014-05-15 NOTE — Patient Instructions (Signed)
[facility] Discharge Instructions for Patients Receiving Chemotherapy  Today you received the following chemotherapy agents taxotere   To help prevent nausea and vomiting after your treatment, we encourage you to take your nausea medication as directed   If you develop nausea and vomiting that is not controlled by your nausea medication, call the clinic.   BELOW ARE SYMPTOMS THAT SHOULD BE REPORTED IMMEDIATELY:  *FEVER GREATER THAN 100.5 F  *CHILLS WITH OR WITHOUT FEVER  NAUSEA AND VOMITING THAT IS NOT CONTROLLED WITH YOUR NAUSEA MEDICATION  *UNUSUAL SHORTNESS OF BREATH  *UNUSUAL BRUISING OR BLEEDING  TENDERNESS IN MOUTH AND THROAT WITH OR WITHOUT PRESENCE OF ULCERS  *URINARY PROBLEMS  *BOWEL PROBLEMS  UNUSUAL RASH Items with * indicate a potential emergency and should be followed up as soon as possible.  Feel free to call the clinic you have any questions or concerns. The clinic phone number is [phone].  

## 2014-05-15 NOTE — Progress Notes (Signed)
Hematology and Oncology Follow Up Visit  [name] [id number] September 20, 1946 68 y.o. 05/15/2014 10:22 AM   [name], [name]  [name]. [name], [name]  Principle Diagnosis:This is a 68 year old gentleman with prostate cancer initially diagnosed in 2001.  He had a Gleason score of 3 + 3 = 6.  He has castration-resistant disease with pelvic adenopathy   Prior Therapy:  1. Status post prostatectomy done in May 2001.  He had T3 N1 disease.  PSA nadir to 0.  2. The patient developed biochemical relapse and received prostatic bed radiation.  3. The patient received Lupron with Casodex due to rising PSA.  The patient subsequently developed castration-resistant disease. 4. Patient treated with Casodex withdrawal and subsequently PSA rose to 18. 5. Patient with Provenge immunotherapy completed in July 2011. 6.  He received ketoconazole and prednisone December 2011 through January 2014. 7.         He is S/P Zytiga 1000 mg daily with Prednisone 5 mg daily on 11/03/12 till 07/2013. This was stopped due to progression of disease.  8.  Xtandi 1000 mg daily 07/2013 through 01/24/2014. Discontinued secondary to disease progression  Current therapy: 1. Systemic chemotherapy with docetaxal 75 mg/m2 given every 3 weeks with Neulasta support. He is here for cycle 5.  2. He is on Lupron 30 mg every 4 months. He is due for an injection in 03/2014 and will be repeated again in 07/2014.   Interim History: [name] presents today for a followup visit with his wife. Since his last visit, he has been doing well. He is tolerating his chemotherapy and Neulasta without difficulty. He is not reporting any nausea.  He is not reporting any vomiting.  Has not reported any back pain but does report some burning pain in his right side since stent placement.  Denies any GI toxicities at this time.  Overall, his performance status and activity levels continue to be close to base line.  He continues to perform  activities of daily living without any hindrance or decline.  Had not reported any genitourinary complaints or bleeding. He has continued to work close to full time.  He has not reported any neurological complaints or decline in his quality of life. Denied seizures or decline in his exercise tolerance. He has not reported any lymphadenopathy or pruritus. He has not reported any petechiae or bleeding. He did report a low-grade fever of 100.0 that resolved spontaneously. Remainder of his review of systems unremarkable.    Medications: I have reviewed the patient's current medications. Current Outpatient Prescriptions  Medication Sig Dispense Refill  . amLODipine (NORVASC) 5 MG tablet Take 5 mg by mouth every evening.       [name] aspirin 81 MG tablet Take 81 mg by mouth daily.        . calcium carbonate (OS-CAL) 1250 MG chewable tablet Chew 2 tablets by mouth daily.      [name] leuprolide (LUPRON) 30 MG injection Inject 30 mg into the muscle every 4 (four) months.       . lidocaine-prilocaine (EMLA) cream APPLY 1 APPLICATION TOPICALLY AS NEEDED  1 g  0  . losartan-hydrochlorothiazide (HYZAAR) 100-12.5 MG per tablet Take 1 tablet by mouth every morning.       . ondansetron (ZOFRAN) 8 MG tablet Take 1 tablet (8 mg total) by mouth every 8 (eight) hours as needed for nausea or vomiting.  20 tablet  1   No current facility-administered medications for this visit.  Allergies: No Known Allergies  Past Medical History, Surgical history, Social history, and Family History were reviewed and updated.    Physical Exam: Blood pressure 115/60, pulse 74, temperature 98.6 F (37 C), temperature source Oral, resp. rate 18, height 5\' 8"  (1.727 m), weight 235 lb 9.6 oz (106.867 kg), SpO2 99.00%. ECOG: 1 General appearance: alert awake appeared in no active distress. Head: Normocephalic, without obvious abnormality, atraumatic Neck: no adenopathy Lymph nodes: Cervical, supraclavicular, and axillary nodes  normal. Heart:regular rate and rhythm, S1, S2 normal, no murmur, click, rub or gallop Lung:chest clear, no wheezing, rales, normal symmetric air entry. Abdomen: soft, non-tender, without masses or organomegaly VOZ:DGUYQ edema noted. Right anterior chest porta cath, well healed, non-tender, no evidence of infection  Lab Results: Lab Results  Component Value Date   WBC 6.5 05/10/2014   HGB 10.0* 05/10/2014   HCT 31.8* 05/10/2014   MCV 84.0 05/10/2014   PLT 236 05/10/2014     Chemistry      Component Value Date/Time   NA 143 05/10/2014 0918   NA 141 02/06/2014 1005   K 4.0 05/10/2014 0918   K 3.7 02/06/2014 1005   CL 104 02/06/2014 1005   CL 108* 03/09/2013 0923   CO2 26 05/10/2014 0918   CO2 26 02/06/2014 1005   BUN 15.2 05/10/2014 0918   BUN 12 02/06/2014 1005   CREATININE 0.9 05/10/2014 0918   CREATININE 0.89 02/06/2014 1005      Component Value Date/Time   CALCIUM 9.3 05/10/2014 0918   CALCIUM 9.4 02/06/2014 1005   ALKPHOS 69 05/10/2014 0918   ALKPHOS 71 12/27/2012 0415   AST 11 05/10/2014 0918   AST 17 12/27/2012 0415   ALT 8 05/10/2014 0918   ALT 24 12/27/2012 0415   BILITOT 0.32 05/10/2014 0918   BILITOT 0.3 12/27/2012 0415      Results for [name] (MRN [id number]) as of 05/15/2014 10:04  Ref. Range 04/22/2014 09:40 05/10/2014 09:18  PSA Latest Range: <=4.00 ng/mL 484.80 (H) 638.60 (H)   Impression and Plan:  This is a 68 year old gentleman with the following issues: 1. Castration-resistant prostate cancer with pelvic adenopathy. He is now on systemic chemotherapy in the form of docetaxel 75 mg per meter squared given every 3 weeks with Neulasta support. He is status post 4 cycles.  He tolerated his chemotherapy and Neulasta injection without any significant adverse effects. His PSA have not really responded properly and most recently was up to 638. I plan on proceeding with systemic chemotherapy cycle 5 today and restaging with a CT scan before the next chemotherapy cycle. If  he has a radiographic response, or continue with the current regimen. If he progresses, and will switch him to Jevtana systemic chemotherapy.  2. Neutropenia prophylaxis: He will receive Neulasta injection after chemo. 3. IV access: Status post right anterior chest Port-A-Cath placement on 02/06/2014. Continue EMLA cream as needed. No complications at this time. 4. Hydronephrosis: He is S/P stent placement. 5. Sepsis and thrombocytopenia: Unlikely related to Zytiga or cancer. Resolved now.  6. Hormonal deprivation.  He continues to be on Lupron. He will receive Lupron today and will repeat in July of 2015 if he has resumed monthly treatment, otherwise he will receive Lupron next in October 2015. 7. 3.8 cm enhancing mass in the upper pole of the left kidney likely renal cell carcinoma. Skeleton and her observation and will followup on at with his next CT scan. 8. Followup will be in 3 weeks before  the next chemotherapy cycle.   Sabreena Vogan,MD 8/5/201510:22 AM

## 2014-05-16 ENCOUNTER — Ambulatory Visit (HOSPITAL_BASED_OUTPATIENT_CLINIC_OR_DEPARTMENT_OTHER): Payer: Medicare Other

## 2014-05-16 VITALS — BP 122/62 | HR 74 | Temp 98.1°F

## 2014-05-16 DIAGNOSIS — C61 Malignant neoplasm of prostate: Secondary | ICD-10-CM

## 2014-05-16 DIAGNOSIS — Z5189 Encounter for other specified aftercare: Secondary | ICD-10-CM

## 2014-05-16 MED ORDER — PEGFILGRASTIM INJECTION 6 MG/0.6ML
6.0000 mg | Freq: Once | SUBCUTANEOUS | Status: AC
  Administered 2014-05-16: 6 mg via SUBCUTANEOUS
  Filled 2014-05-16: qty 0.6

## 2014-06-04 ENCOUNTER — Other Ambulatory Visit (HOSPITAL_BASED_OUTPATIENT_CLINIC_OR_DEPARTMENT_OTHER): Payer: Medicare Other

## 2014-06-04 ENCOUNTER — Encounter: Payer: Self-pay | Admitting: *Deleted

## 2014-06-04 ENCOUNTER — Ambulatory Visit (HOSPITAL_COMMUNITY)
Admission: RE | Admit: 2014-06-04 | Discharge: 2014-06-04 | Disposition: A | Discharge status: Home / Self Care | Payer: Medicare Other | Source: Ambulatory Visit | Attending: Oncology | Admitting: Oncology

## 2014-06-04 ENCOUNTER — Encounter (HOSPITAL_COMMUNITY): Payer: Self-pay

## 2014-06-04 DIAGNOSIS — R599 Enlarged lymph nodes, unspecified: Secondary | ICD-10-CM | POA: Diagnosis not present

## 2014-06-04 DIAGNOSIS — N289 Disorder of kidney and ureter, unspecified: Secondary | ICD-10-CM | POA: Diagnosis not present

## 2014-06-04 DIAGNOSIS — C61 Malignant neoplasm of prostate: Secondary | ICD-10-CM | POA: Insufficient documentation

## 2014-06-04 DIAGNOSIS — N1330 Unspecified hydronephrosis: Secondary | ICD-10-CM | POA: Insufficient documentation

## 2014-06-04 LAB — COMPREHENSIVE METABOLIC PANEL (CC13)
ALBUMIN: 3.5 g/dL (ref 3.5–5.0)
ALK PHOS: 70 U/L (ref 40–150)
ALT: 10 U/L (ref 0–55)
ANION GAP: 8 meq/L (ref 3–11)
AST: 15 U/L (ref 5–34)
BUN: 15.8 mg/dL (ref 7.0–26.0)
CO2: 28 meq/L (ref 22–29)
Calcium: 8.9 mg/dL (ref 8.4–10.4)
Chloride: 110 mEq/L — ABNORMAL HIGH (ref 98–109)
Creatinine: 1 mg/dL (ref 0.7–1.3)
GLUCOSE: 93 mg/dL (ref 70–140)
POTASSIUM: 3.7 meq/L (ref 3.5–5.1)
Sodium: 146 mEq/L — ABNORMAL HIGH (ref 136–145)
Total Bilirubin: 0.28 mg/dL (ref 0.20–1.20)
Total Protein: 6.9 g/dL (ref 6.4–8.3)

## 2014-06-04 LAB — CBC WITH DIFFERENTIAL/PLATELET
BASO%: 1.1 % (ref 0.0–2.0)
BASOS ABS: 0.1 10*3/uL (ref 0.0–0.1)
EOS ABS: 0.1 10*3/uL (ref 0.0–0.5)
EOS%: 0.9 % (ref 0.0–7.0)
HCT: 30.7 % — ABNORMAL LOW (ref 38.4–49.9)
HEMOGLOBIN: 9.5 g/dL — AB (ref 13.0–17.1)
LYMPH%: 19 % (ref 14.0–49.0)
MCH: 26.3 pg — ABNORMAL LOW (ref 27.2–33.4)
MCHC: 30.9 g/dL — ABNORMAL LOW (ref 32.0–36.0)
MCV: 85.1 fL (ref 79.3–98.0)
MONO#: 0.9 10*3/uL (ref 0.1–0.9)
MONO%: 11.3 % (ref 0.0–14.0)
NEUT%: 67.7 % (ref 39.0–75.0)
NEUTROS ABS: 5.2 10*3/uL (ref 1.5–6.5)
PLATELETS: 244 10*3/uL (ref 140–400)
RBC: 3.61 10*6/uL — ABNORMAL LOW (ref 4.20–5.82)
RDW: 19.2 % — AB (ref 11.0–14.6)
WBC: 7.7 10*3/uL (ref 4.0–10.3)
lymph#: 1.5 10*3/uL (ref 0.9–3.3)

## 2014-06-04 MED ORDER — IOHEXOL 300 MG/ML  SOLN
100.0000 mL | Freq: Once | INTRAMUSCULAR | Status: AC | PRN
  Administered 2014-06-04: 100 mL via INTRAVENOUS

## 2014-06-05 ENCOUNTER — Encounter: Payer: Self-pay | Admitting: Oncology

## 2014-06-05 ENCOUNTER — Ambulatory Visit (HOSPITAL_BASED_OUTPATIENT_CLINIC_OR_DEPARTMENT_OTHER): Payer: Medicare Other | Admitting: Oncology

## 2014-06-05 ENCOUNTER — Other Ambulatory Visit: Payer: Self-pay | Admitting: *Deleted

## 2014-06-05 ENCOUNTER — Ambulatory Visit: Payer: Medicare Other

## 2014-06-05 ENCOUNTER — Telehealth: Payer: Self-pay | Admitting: *Deleted

## 2014-06-05 ENCOUNTER — Telehealth: Payer: Self-pay | Admitting: Oncology

## 2014-06-05 VITALS — BP 116/60 | HR 70 | Temp 98.2°F | Resp 18 | Ht 68.0 in | Wt 240.3 lb

## 2014-06-05 DIAGNOSIS — C61 Malignant neoplasm of prostate: Secondary | ICD-10-CM

## 2014-06-05 DIAGNOSIS — E291 Testicular hypofunction: Secondary | ICD-10-CM

## 2014-06-05 LAB — PSA: PSA: 642.2 ng/mL — AB (ref <=4.00)

## 2014-06-05 MED ORDER — LIDOCAINE-PRILOCAINE 2.5-2.5 % EX CREA
TOPICAL_CREAM | CUTANEOUS | Status: AC
  Filled 2014-06-05: qty 5

## 2014-06-05 MED ORDER — LIDOCAINE-PRILOCAINE 2.5-2.5 % EX CREA: TOPICAL_CREAM | CUTANEOUS | Status: AC | PRN

## 2014-06-05 NOTE — Progress Notes (Signed)
Hematology and Oncology Follow Up Visit  [name] [id number] 1946-02-03 68 y.o. 06/05/2014 10:40 AM   [name], [name]  [name], [name]  Principle Diagnosis:This is a 68 year old gentleman with prostate cancer initially diagnosed in 2001.  He had a Gleason score of 3 + 3 = 6.  He has castration-resistant disease with pelvic adenopathy   Prior Therapy:  1. Status post prostatectomy done in May 2001.  He had T3 N1 disease.  PSA nadir to 0.  2. The patient developed biochemical relapse and received prostatic bed radiation.  3. The patient received Lupron with Casodex due to rising PSA.  The patient subsequently developed castration-resistant disease. 4. Patient treated with Casodex withdrawal and subsequently PSA rose to 18. 5. Patient with Provenge immunotherapy completed in July 2011. 6.  He received ketoconazole and prednisone December 2011 through January 2014. 7.         He is S/P Zytiga 1000 mg daily with Prednisone 5 mg daily on 11/03/12 till 07/2013. This was stopped due to progression of disease.  8.  Xtandi 1000 mg daily 07/2013 through 01/24/2014. Discontinued secondary to disease progression  Current therapy: 1. Systemic chemotherapy with docetaxal 75 mg/m2 given every 3 weeks with Neulasta support. He is here for cycle 6.  2. He is on Lupron 30 mg every 4 months. He is due for an injection in 03/2014 and will be repeated again in 07/2014.   Interim History: [name] presents today for a followup visit with his wife. Since his last visit, he relates no new complaints. He is tolerating his chemotherapy and Neulasta without difficulty. He is not reporting any nausea.  He is not reporting any vomiting. He denies any neuropathy or infusion related complications.  Denies any GI toxicities at this time.  Overall, his performance status and activity levels continue to be close to base line.  He continues to perform activities of daily living without any  hindrance or decline.  Had not reported any genitourinary complaints or bleeding.  He has not reported any neurological complaints or decline in his quality of life. Denied seizures or decline in his exercise tolerance. He has not reported any lymphadenopathy or pruritus. He has not reported any petechiae or bleeding. Remainder of his review of systems unremarkable.    Medications: I have reviewed the patient's current medications. Current Outpatient Prescriptions  Medication Sig Dispense Refill  . amLODipine (NORVASC) 5 MG tablet Take 5 mg by mouth every evening.       [name] aspirin 81 MG tablet Take 81 mg by mouth daily.        . calcium carbonate (OS-CAL) 1250 MG chewable tablet Chew 2 tablets by mouth daily.      [name] leuprolide (LUPRON) 30 MG injection Inject 30 mg into the muscle every 4 (four) months.       . lidocaine-prilocaine (EMLA) cream Apply topically as needed.  1 g  2  . losartan-hydrochlorothiazide (HYZAAR) 100-12.5 MG per tablet Take 1 tablet by mouth every morning.       . ondansetron (ZOFRAN) 8 MG tablet Take 1 tablet (8 mg total) by mouth every 8 (eight) hours as needed for nausea or vomiting.  20 tablet  1   No current facility-administered medications for this visit.    Allergies: No Known Allergies  Past Medical History, Surgical history, Social history, and Family History were reviewed and updated.    Physical Exam: Blood pressure 116/60, pulse 70, temperature 98.2 F (  36.8 C), temperature source Oral, resp. rate 18, height 5\' 8"  (1.727 m), weight 240 lb 4.8 oz (108.999 kg), SpO2 99.00%. ECOG: 1 General appearance: alert awake appeared in no active distress. Head: Normocephalic, without obvious abnormality, atraumatic Neck: no adenopathy Lymph nodes: Cervical, supraclavicular, and axillary nodes normal. Heart:regular rate and rhythm, S1, S2 normal, no murmur, click, rub or gallop Lung:chest clear, no wheezing, rales, normal symmetric air entry. Abdomen: soft,  non-tender, without masses or organomegaly KDT:OIZTI edema noted. Right anterior chest porta cath, well healed, non-tender, no evidence of infection  Lab Results: Lab Results  Component Value Date   WBC 7.7 06/04/2014   HGB 9.5* 06/04/2014   HCT 30.7* 06/04/2014   MCV 85.1 06/04/2014   PLT 244 06/04/2014     Chemistry      Component Value Date/Time   NA 146* 06/04/2014 1435   NA 141 02/06/2014 1005   K 3.7 06/04/2014 1435   K 3.7 02/06/2014 1005   CL 104 02/06/2014 1005   CL 108* 03/09/2013 0923   CO2 28 06/04/2014 1435   CO2 26 02/06/2014 1005   BUN 15.8 06/04/2014 1435   BUN 12 02/06/2014 1005   CREATININE 1.0 06/04/2014 1435   CREATININE 0.89 02/06/2014 1005      Component Value Date/Time   CALCIUM 8.9 06/04/2014 1435   CALCIUM 9.4 02/06/2014 1005   ALKPHOS 70 06/04/2014 1435   ALKPHOS 71 12/27/2012 0415   AST 15 06/04/2014 1435   AST 17 12/27/2012 0415   ALT 10 06/04/2014 1435   ALT 24 12/27/2012 0415   BILITOT 0.28 06/04/2014 1435   BILITOT 0.3 12/27/2012 0415      Results for [name], [name] (MRN [id number]) as of 06/05/2014 10:29  Ref. Range 04/22/2014 09:40 05/10/2014 09:18 06/04/2014 14:35  PSA Latest Range: <=4.00 ng/mL 484.80 (H) 638.60 (H) 642.20 (H)      EXAM:  CT CHEST, ABDOMEN, AND PELVIS WITH CONTRAST  TECHNIQUE:  Multidetector CT imaging of the chest, abdomen and pelvis was  performed following the standard protocol during bolus  administration of intravenous contrast.  CONTRAST: 13mL OMNIPAQUE IOHEXOL 300 MG/ML SOLN  COMPARISON: CT chest/ abdomen/ pelvis 01/22/2014  FINDINGS:  CT CHEST FINDINGS  Substernal goiter reidentified. Great vessels are normal in caliber.  Trace fluid in the superior pericardial recess is noted. Heart size  is normal. No pericardial or pleural effusion. Mild bilateral  gynecomastia noted. No thoracic lymphadenopathy. Right-sided  Port-A-Cath in place with tip in the distal SVC. No significant  change in 4 mm right lower lobe superior  segment pulmonary nodule  image 39. Subpleural left upper lobe 4 mm nodule image 23 is noted.  Central airways are patent. No new pulmonary mass, nodule, or  consolidation.  CT ABDOMEN AND PELVIS FINDINGS  Exophytic left upper renal pole cortical mass is larger than  previously, now 3.9 x 3.7 cm image 56, previously 3.8 x 3.4 cm.  Bilateral renal cortical probable cysts are reidentified. No left  hydroureteronephrosis is identified. Interval increase in now severe  right hydroureteronephrosis with a nephroureteral stent in place.  Increased bulky confluent retroperitoneal lymphadenopathy is  identified, now 3.6 by 15.1 cm image 67.  Bulky gastrohepatic and porta hepatis lymphadenopathy is also  larger, representative conglomerate lymph node abutting the gastric  lesser curvature measuring 3.0 cm image 54 compared to 2.6 cm  previously.  Liver, decompressed gallbladder, spleen are unremarkable. The  conglomerate nodal mass directly abuts the pancreas but the pancreas  appears  otherwise normal allowing for suboptimal visualization.  Multiple nodules are noted in the region of the left adrenal gland  and could involve the gland itself but are not individually  separable visually.  Moderate atheromatous aortic calcification. Small ascites tracks  throughout the mesentery. No bowel wall thickening or focal  segmental dilatation. Small fat containing left inguinal hernia with  evidence of herniorrhaphy. Penile implant partly visualized. The  lymphadenopathy extends along the pelvic sidewalls and iliac  vessels, also increased since previously. Left inguinal node is  larger, now 1.2 cm image 101. Degenerative change is noted in the  spine. No lytic or sclerotic osseous lesion.  IMPRESSION:  No new evidence for intrathoracic metastatic disease.  Increased intra-abdominal, retroperitoneal, and pelvic  lymphadenopathy as above, compatible with progression of metastatic  disease.  Increase in  size of left upper renal pole mass highly suspicious for  renal cell carcinoma. Metastasis could appear similar but is  statistically less common in the setting of prostate cancer.  Increased now severe right hydroureteronephrosis with never ureteral  stent in place, likely due to mass effect by retroperitoneal  lymphadenopathy.   Impression and Plan:  This is a 68 year old gentleman with the following issues: 1. Castration-resistant prostate cancer with pelvic adenopathy. He is now on systemic chemotherapy in the form of docetaxel 75 mg per meter squared given every 3 weeks with Neulasta support. He is status post 5 cycles.  His PSA continues to rise up to 642 and his CT scan shows progression of disease. At this point we need to switch his therapy to a different salvage regimen. Risks and benefits of the Jevtana chemotherapy were discussed and he is agreeable to proceed. Complications of this regimen would include nausea, vomiting, myelosuppression, diarrhea and possible peripheral neuropathy. I anticipate to start this regimen on 06/12/2014.  2. Neutropenia prophylaxis: He will receive Neulasta injection after chemo. 3. IV access: Status post right anterior chest Port-A-Cath placement on 02/06/2014. Continue EMLA cream as needed. No complications at this time. 4. Hydronephrosis: He is S/P stent placement. 5. Sepsis and thrombocytopenia: Resolved now.  6. Hormonal deprivation.  He continues to be on Lupron. He will receive Lupron today and will repeat in July of 2015 if he has resumed monthly treatment, otherwise he will receive Lupron next in October 2015. 7. 3.8 cm enhancing mass in the upper pole of the left kidney likely renal cell carcinoma. We will continue  observation and will followup on at with his next CT scan. 8. Followup will be in in 1 week for the start of cycle one Jevtana chemotherapy and on 07/03/2014 with cycle 2.   Aleah Ahlgrim,MD 8/26/201510:40 AM

## 2014-06-05 NOTE — Telephone Encounter (Signed)
Lft msg for pt confirming labs/ov per 08/26 POF, mailed AVS to pt.[name]..KJ

## 2014-06-05 NOTE — Telephone Encounter (Signed)
Per staff message and POF I have scheduled appts. Advised scheduler of appts. JMW  

## 2014-06-06 ENCOUNTER — Ambulatory Visit: Payer: Medicare Other

## 2014-06-12 ENCOUNTER — Ambulatory Visit (HOSPITAL_BASED_OUTPATIENT_CLINIC_OR_DEPARTMENT_OTHER): Payer: Medicare Other

## 2014-06-12 ENCOUNTER — Telehealth: Payer: Self-pay | Admitting: Oncology

## 2014-06-12 ENCOUNTER — Other Ambulatory Visit (HOSPITAL_BASED_OUTPATIENT_CLINIC_OR_DEPARTMENT_OTHER): Payer: Medicare Other

## 2014-06-12 ENCOUNTER — Ambulatory Visit: Payer: Medicare Other

## 2014-06-12 VITALS — BP 118/61 | HR 60 | Temp 98.3°F | Resp 18

## 2014-06-12 DIAGNOSIS — Z95828 Presence of other vascular implants and grafts: Secondary | ICD-10-CM

## 2014-06-12 DIAGNOSIS — Z23 Encounter for immunization: Secondary | ICD-10-CM

## 2014-06-12 DIAGNOSIS — C61 Malignant neoplasm of prostate: Secondary | ICD-10-CM

## 2014-06-12 DIAGNOSIS — Z5111 Encounter for antineoplastic chemotherapy: Secondary | ICD-10-CM

## 2014-06-12 LAB — COMPREHENSIVE METABOLIC PANEL (CC13)
ALK PHOS: 66 U/L (ref 40–150)
ALT: 11 U/L (ref 0–55)
AST: 13 U/L (ref 5–34)
Albumin: 3.4 g/dL — ABNORMAL LOW (ref 3.5–5.0)
Anion Gap: 8 mEq/L (ref 3–11)
BILIRUBIN TOTAL: 0.35 mg/dL (ref 0.20–1.20)
BUN: 19.3 mg/dL (ref 7.0–26.0)
CO2: 25 mEq/L (ref 22–29)
Calcium: 8.8 mg/dL (ref 8.4–10.4)
Chloride: 109 mEq/L (ref 98–109)
Creatinine: 1.1 mg/dL (ref 0.7–1.3)
GLUCOSE: 116 mg/dL (ref 70–140)
Potassium: 3.9 mEq/L (ref 3.5–5.1)
Sodium: 142 mEq/L (ref 136–145)
Total Protein: 6.8 g/dL (ref 6.4–8.3)

## 2014-06-12 LAB — CBC WITH DIFFERENTIAL/PLATELET
BASO%: 1.3 % (ref 0.0–2.0)
Basophils Absolute: 0.1 10*3/uL (ref 0.0–0.1)
EOS%: 9 % — ABNORMAL HIGH (ref 0.0–7.0)
Eosinophils Absolute: 0.6 10*3/uL — ABNORMAL HIGH (ref 0.0–0.5)
HCT: 30 % — ABNORMAL LOW (ref 38.4–49.9)
HGB: 9.3 g/dL — ABNORMAL LOW (ref 13.0–17.1)
LYMPH%: 18.3 % (ref 14.0–49.0)
MCH: 26.4 pg — ABNORMAL LOW (ref 27.2–33.4)
MCHC: 31.1 g/dL — ABNORMAL LOW (ref 32.0–36.0)
MCV: 84.9 fL (ref 79.3–98.0)
MONO#: 0.6 10*3/uL (ref 0.1–0.9)
MONO%: 9 % (ref 0.0–14.0)
NEUT#: 4 10*3/uL (ref 1.5–6.5)
NEUT%: 62.4 % (ref 39.0–75.0)
Platelets: 227 10*3/uL (ref 140–400)
RBC: 3.54 10*6/uL — AB (ref 4.20–5.82)
RDW: 17.7 % — ABNORMAL HIGH (ref 11.0–14.6)
WBC: 6.4 10*3/uL (ref 4.0–10.3)
lymph#: 1.2 10*3/uL (ref 0.9–3.3)

## 2014-06-12 MED ORDER — DIPHENHYDRAMINE HCL 50 MG/ML IJ SOLN
INTRAMUSCULAR | Status: AC
  Filled 2014-06-12: qty 1

## 2014-06-12 MED ORDER — FAMOTIDINE IN NACL 20-0.9 MG/50ML-% IV SOLN
INTRAVENOUS | Status: AC
  Filled 2014-06-12: qty 50

## 2014-06-12 MED ORDER — SODIUM CHLORIDE 0.9 % IJ SOLN
10.0000 mL | INTRAMUSCULAR | Status: DC | PRN
  Administered 2014-06-12: 10 mL
  Filled 2014-06-12: qty 10

## 2014-06-12 MED ORDER — FAMOTIDINE IN NACL 20-0.9 MG/50ML-% IV SOLN
20.0000 mg | Freq: Once | INTRAVENOUS | Status: AC
  Administered 2014-06-12: 20 mg via INTRAVENOUS

## 2014-06-12 MED ORDER — DIPHENHYDRAMINE HCL 50 MG/ML IJ SOLN
25.0000 mg | Freq: Once | INTRAMUSCULAR | Status: AC
  Administered 2014-06-12: 25 mg via INTRAVENOUS

## 2014-06-12 MED ORDER — HEPARIN SOD (PORK) LOCK FLUSH 100 UNIT/ML IV SOLN
500.0000 [IU] | Freq: Once | INTRAVENOUS | Status: AC | PRN
  Administered 2014-06-12: 500 [IU]
  Filled 2014-06-12: qty 5

## 2014-06-12 MED ORDER — INFLUENZA VAC SPLIT QUAD 0.5 ML IM SUSY
0.5000 mL | PREFILLED_SYRINGE | Freq: Once | INTRAMUSCULAR | Status: AC
Start: 2014-06-12 — End: 2014-06-12
  Administered 2014-06-12: 0.5 mL via INTRAMUSCULAR
  Filled 2014-06-12: qty 0.5

## 2014-06-12 MED ORDER — DEXAMETHASONE SODIUM PHOSPHATE 20 MG/5ML IJ SOLN
12.0000 mg | Freq: Once | INTRAMUSCULAR | Status: AC
Start: 2014-06-12 — End: 2014-06-12
  Administered 2014-06-12: 12 mg via INTRAVENOUS

## 2014-06-12 MED ORDER — SODIUM CHLORIDE 0.9 % IV SOLN
Freq: Once | INTRAVENOUS | Status: AC
  Administered 2014-06-12: 10:00:00 via INTRAVENOUS

## 2014-06-12 MED ORDER — ONDANSETRON 8 MG/NS 50 ML IVPB
INTRAVENOUS | Status: AC
  Filled 2014-06-12: qty 8

## 2014-06-12 MED ORDER — DEXAMETHASONE SODIUM PHOSPHATE 20 MG/5ML IJ SOLN
INTRAMUSCULAR | Status: AC
  Filled 2014-06-12: qty 5

## 2014-06-12 MED ORDER — ONDANSETRON 8 MG/50ML IVPB (CHCC)
8.0000 mg | Freq: Once | INTRAVENOUS | Status: AC
  Administered 2014-06-12: 8 mg via INTRAVENOUS

## 2014-06-12 MED ORDER — SODIUM CHLORIDE 0.9 % IJ SOLN
10.0000 mL | INTRAMUSCULAR | Status: DC | PRN
  Administered 2014-06-12: 10 mL via INTRAVENOUS
  Filled 2014-06-12: qty 10

## 2014-06-12 MED ORDER — DEXTROSE 5 % IV SOLN
25.0000 mg/m2 | Freq: Once | INTRAVENOUS | Status: AC
  Administered 2014-06-12: 57 mg via INTRAVENOUS
  Filled 2014-06-12: qty 5.7

## 2014-06-12 NOTE — Telephone Encounter (Signed)
pt came in to get print out of sched

## 2014-06-12 NOTE — Patient Instructions (Signed)

## 2014-06-12 NOTE — Patient Instructions (Signed)
[facility] Discharge Instructions for Patients Receiving Chemotherapy  Today you received the following chemotherapy agents ; Jevtana.   To help prevent nausea and vomiting after your treatment, we encourage you to take your nausea medication as directed.    If you develop nausea and vomiting that is not controlled by your nausea medication, call the clinic.   BELOW ARE SYMPTOMS THAT SHOULD BE REPORTED IMMEDIATELY:  *FEVER GREATER THAN 100.5 F  *CHILLS WITH OR WITHOUT FEVER  NAUSEA AND VOMITING THAT IS NOT CONTROLLED WITH YOUR NAUSEA MEDICATION  *UNUSUAL SHORTNESS OF BREATH  *UNUSUAL BRUISING OR BLEEDING  TENDERNESS IN MOUTH AND THROAT WITH OR WITHOUT PRESENCE OF ULCERS  *URINARY PROBLEMS  *BOWEL PROBLEMS  UNUSUAL RASH Items with * indicate a potential emergency and should be followed up as soon as possible.  Feel free to call the clinic you have any questions or concerns. The clinic phone number is [phone].   Cabazitaxel injection (JEVTANA) What is this medicine? CABAZITAXEL (ka baz i TAX el) is a chemotherapy drug. It is used to treat prostate cancer. It targets fast dividing cells, like cancer cells, and causes these cells to die. This medicine may be used for other purposes; ask your health care provider or pharmacist if you have questions. COMMON BRAND NAME(S): Jevtana What should I tell my health care provider before I take this medicine? They need to know if you have any of these conditions: -kidney disease -liver disease -low blood counts, like low white cell, platelet, or red cell counts -an unusual or allergic reaction to cabazitaxel, other medicines, foods, dyes, or preservatives -pregnant or trying to get pregnant -breast-feeding How should I use this medicine? This medicine is for infusion into a vein. It is given by a health care professional in a hospital or clinic setting. Talk to your pediatrician regarding the use of this  medicine in children. Special care may be needed. Overdosage: If you think you've taken too much of this medicine contact a poison control center or emergency room at once. Overdosage: If you think you have taken too much of this medicine contact a poison control center or emergency room at once. NOTE: This medicine is only for you. Do not share this medicine with others. What if I miss a dose? It is important not to miss your dose. Call your doctor or health care professional if you are unable to keep an appointment. What may interact with this medicine? -atazanavir -clarithromycin -indinavir -medicines for fungal infections like ketoconazole, fluconazole, itraconazole, and voriconazole -nefazodone -nelfinavir -ritonavir -saquinavir -telithromycin This list may not describe all possible interactions. Give your health care provider a list of all the medicines, herbs, non-prescription drugs, or dietary supplements you use. Also tell them if you smoke, drink alcohol, or use illegal drugs. Some items may interact with your medicine. What should I watch for while using this medicine? Your condition will be monitored carefully while you are receiving this medicine. This drug may make you feel generally unwell. This is not uncommon, as chemotherapy can affect healthy cells as well as cancer cells. Report any side effects. Continue your course of treatment even though you feel ill unless your doctor tells you to stop. Call your doctor or health care professional for advice if you get a fever, chills or sore throat, or other symptoms of a cold or flu. Do not treat yourself. This drug decreases your body's ability to fight infections. Try to avoid being around people who are sick.  This medicine may increase your risk to bruise or bleed. Call your doctor or health care professional if you notice any unusual bleeding. Be careful brushing and flossing your teeth or using a toothpick because you may get an  infection or bleed more easily. If you have any dental work done, tell your dentist you are receiving this medicine. Avoid taking products that contain aspirin, acetaminophen, ibuprofen, naproxen, or ketoprofen unless instructed by your doctor. These medicines may hide a fever. Do not become pregnant while taking this medicine. Women should inform their doctor if they wish to become pregnant or think they might be pregnant. There is a potential for serious side effects to an unborn child. Talk to your health care professional or pharmacist for more information. Do not breast-feed an infant while taking this medicine. What side effects may I notice from receiving this medicine? Side effects that you should report to your doctor or health care professional as soon as possible: -allergic reactions like skin rash, itching or hives, swelling of the face, lips, or tongue -breathing problems -constipation -diarrhea -low blood counts - this medicine may decrease the number of white blood cells, red blood cells and platelets. You may be at increased risk for infections and bleeding. -pain, tingling, numbness in the hands or feet -severe abdominal pain -signs of infection - fever or chills, cough, sore throat, pain or difficulty passing urine -signs of decreased platelets or bleeding - bruising, pinpoint red spots on the skin, black, tarry stools, blood in the urine -signs of decreased red blood cells - unusually weak or tired, fainting spells, lightheadedness -vomiting Side effects that usually do not require medical attention (Report these to your doctor or health care professional if they continue or are bothersome.): -back pain -change in taste -hair loss -headache -loss of appetite -muscle or joint pain -nausea -upset stomach This list may not describe all possible side effects. Call your doctor for medical advice about side effects. You may report side effects to FDA at 1-800-FDA-1088. Where  should I keep my medicine? This drug is given in a hospital or clinic and will not be stored at home. NOTE: This sheet is a summary. It may not cover all possible information. If you have questions about this medicine, talk to your doctor, pharmacist, or health care provider.  2015, [employer]. (2013-09-11 10:50:16)

## 2014-06-13 ENCOUNTER — Telehealth: Payer: Self-pay | Admitting: *Deleted

## 2014-06-13 ENCOUNTER — Ambulatory Visit (HOSPITAL_BASED_OUTPATIENT_CLINIC_OR_DEPARTMENT_OTHER): Payer: Medicare Other

## 2014-06-13 ENCOUNTER — Telehealth: Payer: Self-pay | Admitting: Oncology

## 2014-06-13 VITALS — BP 132/61 | HR 64 | Temp 98.2°F

## 2014-06-13 DIAGNOSIS — C61 Malignant neoplasm of prostate: Secondary | ICD-10-CM

## 2014-06-13 DIAGNOSIS — Z5189 Encounter for other specified aftercare: Secondary | ICD-10-CM

## 2014-06-13 MED ORDER — PEGFILGRASTIM INJECTION 6 MG/0.6ML
6.0000 mg | Freq: Once | SUBCUTANEOUS | Status: AC
  Administered 2014-06-13: 6 mg via SUBCUTANEOUS
  Filled 2014-06-13: qty 0.6

## 2014-06-13 NOTE — Telephone Encounter (Signed)
pt came in r/s lab done....pt ok and aware of time

## 2014-06-13 NOTE — Telephone Encounter (Signed)
Ganesh here for Neulasta injection following 1st jevtana chemo treatment.  States that he is doing well.  No nausea, vomiting or diarrhea.  Is drinking and eating well.  All questions answered.  Knows to call if he has any problems or concerns.

## 2014-06-18 ENCOUNTER — Other Ambulatory Visit: Payer: Self-pay | Admitting: Urology

## 2014-06-19 ENCOUNTER — Encounter (HOSPITAL_BASED_OUTPATIENT_CLINIC_OR_DEPARTMENT_OTHER): Payer: Self-pay | Admitting: *Deleted

## 2014-06-19 NOTE — Progress Notes (Addendum)
SPOKE TO WIFE. NPO AFTER MN. ARRIVE AT 0615. NEEDS ISTAT AND EKG.

## 2014-06-21 ENCOUNTER — Telehealth: Payer: Self-pay | Admitting: Medical Oncology

## 2014-06-21 NOTE — Telephone Encounter (Signed)
Spouse called to inform office that patient running low grade temp of 100.8 and she had given him 2 tylenols, also reports legs hurting him "achey feeling."  Patient did have tx last week with neulasta the following day and a flu shot. Asked wife to call us back in a hour to inform whether temp going down.   Call back from wife and reports temp down to 100.6 and he is eating. Per MD, informed wife to given tylenol q 6 hrs as needed for temp and should pts temp increase to 101.5 or greater to call office/clinic.  Wife gave verbal understanding, no further questions at this time.

## 2014-06-23 NOTE — Anesthesia Preprocedure Evaluation (Addendum)
Anesthesia Evaluation  Patient identified by MRN, date of birth, ID band Patient awake    Reviewed: Allergy & Precautions, H&P , NPO status , Patient's Chart, lab work & pertinent test results  Airway Mallampati: III  TM Distance: >3 FB Neck ROM: full    Dental  (+) Caps, Dental Advisory Given,  Upper front 3 teeth are capped:   Pulmonary sleep apnea and Continuous Positive Airway Pressure Ventilation , PE breath sounds clear to auscultation  Pulmonary exam normal       Cardiovascular hypertension, Pt. on medications Rhythm:regular Rate:Normal     Neuro/Psych negative neurological ROS  negative psych ROS   GI/Hepatic negative GI ROS, Neg liver ROS,   Endo/Other  negative endocrine ROS  Renal/GU negative Renal ROSHistory acute renal failure with sepsis last year  negative genitourinary   Musculoskeletal  (+) Arthritis -, Osteoarthritis,    Abdominal   Peds  Hematology negative hematology ROS (+)   Anesthesia Other Findings   Reproductive/Obstetrics negative OB ROS                             Anesthesia Physical  Anesthesia Plan  ASA: III  Anesthesia Plan: General   Post-op Pain Management:    Induction: Intravenous  Airway Management Planned: LMA  Additional Equipment:   Intra-op Plan:   Post-operative Plan:   Informed Consent: I have reviewed the patients History and Physical, chart, labs and discussed the procedure including the risks, benefits and alternatives for the proposed anesthesia with the patient or authorized representative who has indicated his/her understanding and acceptance.   Dental Advisory Given  Plan Discussed with: CRNA and Surgeon  Anesthesia Plan Comments:         Anesthesia Quick Evaluation  

## 2014-06-24 ENCOUNTER — Ambulatory Visit (HOSPITAL_BASED_OUTPATIENT_CLINIC_OR_DEPARTMENT_OTHER): Payer: Medicare Other | Admitting: Anesthesiology

## 2014-06-24 ENCOUNTER — Encounter (HOSPITAL_BASED_OUTPATIENT_CLINIC_OR_DEPARTMENT_OTHER): Payer: Self-pay | Admitting: *Deleted

## 2014-06-24 ENCOUNTER — Encounter (HOSPITAL_BASED_OUTPATIENT_CLINIC_OR_DEPARTMENT_OTHER)
Admission: RE | Disposition: A | Discharge status: Home / Self Care | Payer: Self-pay | Source: Ambulatory Visit | Attending: Urology

## 2014-06-24 ENCOUNTER — Encounter (HOSPITAL_BASED_OUTPATIENT_CLINIC_OR_DEPARTMENT_OTHER): Payer: Medicare Other | Admitting: Anesthesiology

## 2014-06-24 ENCOUNTER — Ambulatory Visit (HOSPITAL_BASED_OUTPATIENT_CLINIC_OR_DEPARTMENT_OTHER)
Admission: RE | Admit: 2014-06-24 | Discharge: 2014-06-24 | Disposition: A | Discharge status: Home / Self Care | Payer: Medicare Other | Source: Ambulatory Visit | Attending: Urology | Admitting: Urology

## 2014-06-24 DIAGNOSIS — N2889 Other specified disorders of kidney and ureter: Secondary | ICD-10-CM | POA: Insufficient documentation

## 2014-06-24 DIAGNOSIS — I10 Essential (primary) hypertension: Secondary | ICD-10-CM | POA: Diagnosis not present

## 2014-06-24 DIAGNOSIS — N1330 Unspecified hydronephrosis: Secondary | ICD-10-CM | POA: Insufficient documentation

## 2014-06-24 DIAGNOSIS — C61 Malignant neoplasm of prostate: Secondary | ICD-10-CM | POA: Insufficient documentation

## 2014-06-24 DIAGNOSIS — Z86711 Personal history of pulmonary embolism: Secondary | ICD-10-CM | POA: Diagnosis not present

## 2014-06-24 DIAGNOSIS — N529 Male erectile dysfunction, unspecified: Secondary | ICD-10-CM | POA: Diagnosis not present

## 2014-06-24 DIAGNOSIS — G4733 Obstructive sleep apnea (adult) (pediatric): Secondary | ICD-10-CM | POA: Insufficient documentation

## 2014-06-24 DIAGNOSIS — M1990 Unspecified osteoarthritis, unspecified site: Secondary | ICD-10-CM | POA: Diagnosis not present

## 2014-06-24 HISTORY — DX: Adverse effect of antineoplastic and immunosuppressive drugs, initial encounter: T45.1X5A

## 2014-06-24 HISTORY — DX: Other fatigue: R53.83

## 2014-06-24 HISTORY — DX: Adverse effect of antineoplastic and immunosuppressive drugs, initial encounter: R11.0

## 2014-06-24 HISTORY — DX: Other specified disorders of kidney and ureter: N28.89

## 2014-06-24 HISTORY — PX: CYSTOSCOPY W/ URETERAL STENT PLACEMENT: SHX1429

## 2014-06-24 LAB — POCT I-STAT 4, (NA,K, GLUC, HGB,HCT)
Glucose, Bld: 108 mg/dL — ABNORMAL HIGH (ref 70–99)
HCT: 33 % — ABNORMAL LOW (ref 39.0–52.0)
Hemoglobin: 11.2 g/dL — ABNORMAL LOW (ref 13.0–17.0)
Potassium: 3.9 mEq/L (ref 3.7–5.3)
SODIUM: 138 meq/L (ref 137–147)

## 2014-06-24 SURGERY — CYSTOSCOPY, WITH RETROGRADE PYELOGRAM AND URETERAL STENT INSERTION
Anesthesia: General | Site: Ureter | Laterality: Right

## 2014-06-24 MED ORDER — BELLADONNA ALKALOIDS-OPIUM 16.2-60 MG RE SUPP
RECTAL | Status: AC
  Filled 2014-06-24: qty 1

## 2014-06-24 MED ORDER — CEFAZOLIN SODIUM-DEXTROSE 2-3 GM-% IV SOLR
INTRAVENOUS | Status: AC
  Filled 2014-06-24: qty 50

## 2014-06-24 MED ORDER — FENTANYL CITRATE 0.05 MG/ML IJ SOLN
INTRAMUSCULAR | Status: DC | PRN
  Administered 2014-06-24: 50 ug via INTRAVENOUS
  Administered 2014-06-24 (×2): 25 ug via INTRAVENOUS

## 2014-06-24 MED ORDER — MIDAZOLAM HCL 2 MG/2ML IJ SOLN
INTRAMUSCULAR | Status: AC
  Filled 2014-06-24: qty 2

## 2014-06-24 MED ORDER — PROPOFOL INFUSION 10 MG/ML OPTIME
INTRAVENOUS | Status: DC | PRN
  Administered 2014-06-24: 150 mL via INTRAVENOUS

## 2014-06-24 MED ORDER — HYDROCODONE-ACETAMINOPHEN 5-325 MG PO TABS: 1.0000 | ORAL_TABLET | ORAL | Status: DC | PRN

## 2014-06-24 MED ORDER — MIDAZOLAM HCL 5 MG/5ML IJ SOLN
INTRAMUSCULAR | Status: DC | PRN
  Administered 2014-06-24: 2 mg via INTRAVENOUS

## 2014-06-24 MED ORDER — SODIUM CHLORIDE 0.9 % IR SOLN
Status: DC | PRN
  Administered 2014-06-24: 3000 mL

## 2014-06-24 MED ORDER — LACTATED RINGERS IV SOLN
INTRAVENOUS | Status: DC
  Administered 2014-06-24: 07:00:00 via INTRAVENOUS
  Filled 2014-06-24: qty 1000

## 2014-06-24 MED ORDER — STERILE WATER FOR IRRIGATION IR SOLN
Status: DC | PRN
  Administered 2014-06-24: 3000 mL

## 2014-06-24 MED ORDER — LACTATED RINGERS IV SOLN
INTRAVENOUS | Status: DC
  Filled 2014-06-24: qty 1000

## 2014-06-24 MED ORDER — IOHEXOL 350 MG/ML SOLN
INTRAVENOUS | Status: DC | PRN
  Administered 2014-06-24: 40 mL

## 2014-06-24 MED ORDER — FENTANYL CITRATE 0.05 MG/ML IJ SOLN
INTRAMUSCULAR | Status: AC
  Filled 2014-06-24: qty 4

## 2014-06-24 MED ORDER — STERILE WATER FOR IRRIGATION IR SOLN
Status: DC | PRN
  Administered 2014-06-24: 500 mL

## 2014-06-24 MED ORDER — CEFAZOLIN SODIUM-DEXTROSE 2-3 GM-% IV SOLR
INTRAVENOUS | Status: DC | PRN
  Administered 2014-06-24: 2 g via INTRAVENOUS

## 2014-06-24 MED ORDER — DEXAMETHASONE SODIUM PHOSPHATE 10 MG/ML IJ SOLN
INTRAMUSCULAR | Status: DC | PRN
  Administered 2014-06-24: 10 mg via INTRAVENOUS

## 2014-06-24 MED ORDER — FENTANYL CITRATE 0.05 MG/ML IJ SOLN
25.0000 ug | INTRAMUSCULAR | Status: DC | PRN
  Filled 2014-06-24: qty 1

## 2014-06-24 MED ORDER — LIDOCAINE HCL (CARDIAC) 20 MG/ML IV SOLN
INTRAVENOUS | Status: DC | PRN
  Administered 2014-06-24: 60 mg via INTRAVENOUS

## 2014-06-24 MED ORDER — ONDANSETRON HCL 4 MG/2ML IJ SOLN
INTRAMUSCULAR | Status: DC | PRN
Start: 2014-06-24 — End: 2014-06-24
  Administered 2014-06-24: 4 mg via INTRAVENOUS

## 2014-06-24 MED ORDER — ACETAMINOPHEN 10 MG/ML IV SOLN
INTRAVENOUS | Status: DC | PRN
  Administered 2014-06-24: 1000 mg via INTRAVENOUS

## 2014-06-24 MED ORDER — SULFAMETHOXAZOLE-TMP DS 800-160 MG PO TABS: 1.0000 | ORAL_TABLET | Freq: Two times a day (BID) | ORAL | Status: DC

## 2014-06-24 SURGICAL SUPPLY — 26 items
ADAPTER CATH URET PLST 4-6FR (CATHETERS) IMPLANT
BAG DRAIN URO-CYSTO SKYTR STRL (DRAIN) ×3 IMPLANT
CANISTER SUCT LVC 12 LTR MEDI- (MISCELLANEOUS) ×3 IMPLANT
CATH INTERMIT  6FR 70CM (CATHETERS) IMPLANT
CLOTH BEACON ORANGE TIMEOUT ST (SAFETY) ×3 IMPLANT
DRAPE CAMERA CLOSED 9X96 (DRAPES) ×3 IMPLANT
GLOVE BIO SURGEON STRL SZ 6 (GLOVE) ×3 IMPLANT
GLOVE BIO SURGEON STRL SZ8 (GLOVE) ×3 IMPLANT
GLOVE INDICATOR 6.5 STRL GRN (GLOVE) ×6 IMPLANT
GOWN STRL REIN XL XLG (GOWN DISPOSABLE) IMPLANT
GOWN STRL REUS W/ TWL LRG LVL3 (GOWN DISPOSABLE) ×1 IMPLANT
GOWN STRL REUS W/ TWL XL LVL3 (GOWN DISPOSABLE) ×1 IMPLANT
GOWN STRL REUS W/TWL LRG LVL3 (GOWN DISPOSABLE) ×2
GOWN STRL REUS W/TWL XL LVL3 (GOWN DISPOSABLE) ×2
GOWN XL W/COTTON TOWEL STD (GOWNS) ×3 IMPLANT
GUIDEWIRE 0.038 PTFE COATED (WIRE) IMPLANT
GUIDEWIRE ANG ZIPWIRE 038X150 (WIRE) ×3 IMPLANT
GUIDEWIRE LUND  SF38120LUND (WIRE) ×3 IMPLANT
GUIDEWIRE STR DUAL SENSOR (WIRE) ×3 IMPLANT
IV NS IRRIG 3000ML ARTHROMATIC (IV SOLUTION) ×3 IMPLANT
KIT BALLIN UROMAX 15FX10 (LABEL) ×1 IMPLANT
NS IRRIG 500ML POUR BTL (IV SOLUTION) IMPLANT
PACK CYSTOSCOPY (CUSTOM PROCEDURE TRAY) ×3 IMPLANT
SET HIGH PRES BAL DIL (LABEL) ×2
STENT CONTOUR 8FR X 24 (STENTS) ×3 IMPLANT
WATER STERILE IRR 500ML POUR (IV SOLUTION) ×3 IMPLANT

## 2014-06-24 NOTE — Op Note (Addendum)
PATIENT:  [name] Re.  PRE-OPERATIVE DIAGNOSIS: Malignant right hydronephrosis  POST-OPERATIVE DIAGNOSIS: Same, with encrusted ureteral stent  PROCEDURE: Cystoscopy, right retrograde ureteropyelogram, difficult extraction of right ureteral stent, replacement of right ureteral stent, interpretive fluoroscopy  SURGEON:  [name]. Luane Rochon, [name]  ANESTHESIA:  General  EBL:  Minimal  DRAINS: 24 cm 8 french contour stent without string  LOCAL MEDICATIONS USED:  None  SPECIMEN:  None  INDICATION: [name]. is a 68 year old male with advanced/castrate resistant prostate cancer. He has malignant hydronephrosis of the right kidney, presenting with sepsis in March of 2014. Since then, his right renal unit has been drained with a double-J stent, last changed in January of this year. Recent imaging revealed recurrent right hydronephrosis with adequately positioned stent. He presents at this time for stent change. Risks and complications of the procedure have been discussed with the patient and his wife who desire to proceed.  Description of procedure: The patient was properly identified and marked (if applicable) in the holding area. They were then  taken to the operating room and placed on the table in a supine position. General anesthesia was then administered. Once fully anesthetized the patient was moved to the dorsolithotomy position and the genitalia and perineum were sterilely prepped and draped in standard fashion. An official timeout was then performed.   a 28 French panendoscope was advanced into his bladder. There was a false passage posteriorly at the bladder neck. This was easily navigated, and the bladder was inspected circumferentially. No tumors trabeculations or urothelial abnormalities were noted. There was a stent present at the right ureteral orifice with multiple stones/encrustations on the curled intravesical length. This was grasped with the alligator forceps.  It could be extracted perhaps to the mid urethra. After genital/continuous traction, I could get the distal end of the stent out through the urethral meatus. I was unable to cannulate this with the guidewire. With difficulty, I was eventually able to navigate the guidewire up proximally in the ureter perhaps 3-4 cm. Over top of this, I then passed a 6 [place] open-ended catheter and a retrograde was performed. This revealed an intact ureter but was significant ureteral dilatation especially in the mid to proximal ureter. The contrast would not pass into the renal pelvis. I was eventually able to navigate the guidewire through the open-ended catheter, and, with quite a lot of difficulty, eventually navigated it more proximally and what I thought was the renal pelvis. I then passed the open-ended catheter over top of the guidewire, and a hydronephrotic drip was obtained. Retrograde revealed significant pyelocaliectasis. There was no extravasation of contrast along the ureter. I then removed the open-ended catheter, and over top of the guidewire eventually dilated the entire ureter to 29 [place] with a ureteral dilatation balloon. This was done using fluoroscopic guidance. Once the ureteral dilatation was completed, I then grasped the stent again, and extracted without difficulty. The proximal and was significantly encrusted with multiple stones. At this point, I used fluoroscopic guidance to pass an 8 [place], 24 cm double-J stent. Good proximal and distal curls were obtained with fluoroscopic and cystoscopic guidance. The bladder was drained and the and most of the stone fragments were drained as well.  The scope was removed. The patient was awakened and taken to the PACU in stable condition. He tolerated the procedure well.     PLAN OF CARE: Discharge to home after PACU  PATIENT DISPOSITION:  PACU - hemodynamically stable.

## 2014-06-24 NOTE — Transfer of Care (Signed)
Immediate Anesthesia Transfer of Care Note  Patient: [name].  Procedure(s) Performed: Procedure(s): CYSTOSCOPY WITH RETROGRADE PYELOGRAM/ URETERAL STENT REPLACEMENT WITH URETERAL DILATION, RIGHT (Right)  Patient Location: PACU  Anesthesia Type:General  Level of Consciousness: awake, alert , oriented and patient cooperative  Airway & Oxygen Therapy: Patient Spontanous Breathing and Patient connected to nasal cannula oxygen  Post-op Assessment: Report given to PACU RN and Post -op Vital signs reviewed and stable  Post vital signs: Reviewed and stable  Complications: No apparent anesthesia complications

## 2014-06-24 NOTE — Anesthesia Procedure Notes (Signed)
Procedure Name: LMA Insertion Date/Time: 06/24/2014 7:41 AM Performed by: Wanita Chamberlain Pre-anesthesia Checklist: Patient identified, Timeout performed, Emergency Drugs available, Suction available and Patient being monitored Patient Re-evaluated:Patient Re-evaluated prior to inductionOxygen Delivery Method: Circle system utilized Preoxygenation: Pre-oxygenation with 100% oxygen Intubation Type: IV induction Ventilation: Mask ventilation without difficulty LMA: LMA inserted LMA Size: 5.0 Number of attempts: 1 Airway Equipment and Method: Bite block Tube secured with: Tape Dental Injury: Teeth and Oropharynx as per pre-operative assessment

## 2014-06-24 NOTE — Discharge Instructions (Addendum)
1. You may see some blood in the urine and may have some burning with urination for 48-72 hours. You also may notice that you have to urinate more frequently or urgently after your procedure which is normal.  °2. You should call should you develop an inability urinate, fever > 101, persistent nausea and vomiting that prevents you from eating or drinking to stay hydrated.  °3. If you have a stent, you will likely urinate more frequently and urgently until the stent is removed and you may experience some discomfort/pain in the lower abdomen and flank especially when urinating. You may take pain medication prescribed to you if needed for pain. You may also intermittently have blood in the urine until the stent is removed.If you have a catheter, you will be taught how to take care of the catheter by the nursing staff prior to discharge from the hospital.  You may periodically feel a strong urge to void with the catheter in place.  This is a bladder spasm and most often can occur when having a bowel movement or moving around. It is typically self-limited and usually will stop after a few minutes.  You may use some Vaseline or Neosporin around the tip of the catheter to reduce friction at the tip of the penis. You may also see some blood in the urine.  A very small amount of blood can make the urine look quite red.  As long as the catheter is draining well, there usually is not a problem.  However, if the catheter is not draining well and is bloody, you should call the office ([phone]) to notify us. ° °Post Anesthesia Home Care Instructions ° °Activity: °Get plenty of rest for the remainder of the day. A responsible adult should stay with you for 24 hours following the procedure.  °For the next 24 hours, DO NOT: °-Drive a car °-Operate machinery °-Drink alcoholic beverages °-Take any medication unless instructed by your physician °-Make any legal decisions or sign important papers. ° °Meals: °Start with liquid foods  such as gelatin or soup. Progress to regular foods as tolerated. Avoid greasy, spicy, heavy foods. If nausea and/or vomiting occur, drink only clear liquids until the nausea and/or vomiting subsides. Call your physician if vomiting continues. ° °Special Instructions/Symptoms: °Your throat may feel dry or sore from the anesthesia or the breathing tube placed in your throat during surgery. If this causes discomfort, gargle with warm salt water. The discomfort should disappear within 24 hours. °

## 2014-06-24 NOTE — Anesthesia Postprocedure Evaluation (Signed)
  Anesthesia Post-op Note  Patient: [name].  Procedure(s) Performed: Procedure(s) (LRB): CYSTOSCOPY WITH RETROGRADE PYELOGRAM/ URETERAL STENT REPLACEMENT WITH URETERAL DILATION, RIGHT (Right)  Patient Location: PACU  Anesthesia Type: General  Level of Consciousness: awake and alert   Airway and Oxygen Therapy: Patient Spontanous Breathing  Post-op Pain: mild  Post-op Assessment: Post-op Vital signs reviewed, Patient's Cardiovascular Status Stable, Respiratory Function Stable, Patent Airway and No signs of Nausea or vomiting  Last Vitals:  Filed Vitals:   06/24/14 0900  BP: 110/66  Pulse: 70  Temp:   Resp: 15    Post-op Vital Signs: stable   Complications: No apparent anesthesia complications

## 2014-06-24 NOTE — H&P (Signed)
Urology History and Physical Exam  CC: Blocked right kidney  HPI: 68 year old male with progressive , castrate resistant prostate cancer presents for cystoscopy and exchange of a right double J stent. He has had his right kidney drained for malignant hydronephrosis since he presented with sepsis in March 2014. His last stent change was in January of this year. Recent imaging revealed recurrent hydronephrosis, and I recommended repeat stent change.  PMH: Past Medical History  Diagnosis Date  . Hypertension   . Osteoarthritis   . Retroperitoneal lymphadenopathy   . History of pulmonary embolus (PE)     dec 2009  . History of DVT (deep vein thrombosis)     dec 2009  . OSA on CPAP   . History of acute renal failure     W/ SEPSIS  MARCH 2014  . Urgency of urination   . ED (erectile dysfunction)   . Left renal mass     MONITORED BY UROLOGIST-  DR Diona Fanti  . Hydronephrosis, right   . Prostate cancer ONCOLOGIST-  DR Alen Blew    DX 2001 (T3 N1)-- S/P PROSTATECTOMY--/ BIOCHEMICAL RELAPSE, RECEIVED PROSTATIC BED RADIATION AND LUPRON WITH CASODEX   SINCE DEVELOPED  CASTATION RESISITENT METASTATIC PROSTATE ADENOCARINOMA--  CURRENT THERAPY CHEMOTHEAPY EVERY 3 WEEKS AND LUPRON INJECTIONS EVERY 4 MONTHS  . Fatigue   . Chemotherapy-induced nausea     PSH: Past Surgical History  Procedure Laterality Date  . Total knee arthroplasty Bilateral RIGHT  04-24-2009/   LEFT  2005  . Insertion ivc filter and removal  03-25-2009/   REMOVAL 05-29-2009  . Prostatectomy  MAY 2001  . Transthoracic echocardiogram  12-27-2012    MILD LVH/  EF 16%/  GRADE I DIASTOLIC DYSFUNCTION  . Inguinal hernia repair Right 2010  . Penile prosthesis implant  2005  . Cystoscopy w/ ureteral stent placement Right 10/22/2013    Procedure: CYSTOSCOPY WITH STENT REPLACEMENT;  Surgeon: Franchot Gallo, MD;  Location: [facility];  Service: Urology;  Laterality: Right;  . Portacath placement  02-06-2014  .  Colonoscopy w/ polypectomy  05-23-2013    Allergies: No Known Allergies  Medications: No prescriptions prior to admission     Social History: History   Social History  . Marital Status: Married    Spouse Name: N/A    Number of Children: N/A  . Years of Education: N/A   Occupational History  . Not on file.   Social History Main Topics  . Smoking status: Never Smoker   . Smokeless tobacco: Never Used  . Alcohol Use: 3.6 oz/week    6 Cans of beer per week  . Drug Use: No  . Sexual Activity: Not on file   Other Topics Concern  . Not on file   Social History Narrative  . No narrative on file    Family History: Family History  Problem Relation Age of Onset  . Colon cancer Neg Hx   . Heart disease Mother     Review of Systems: Positive: N/A Negative:   A further 10 point review of systems was negative except what is listed in the HPI.                  Physical Exam: @VITALS2 @ General: No acute distress.  Awake. Head:  Normocephalic.  Atraumatic. ENT:  EOMI.  Mucous membranes moist Neck:  Supple.  No lymphadenopathy. CV:  S1 present. S2 present. Regular rate. Pulmonary: Equal effort bilaterally.  Clear to auscultation bilaterally. Abdomen: Soft.  Non tender to palpation. Skin:  Normal turgor.  No visible rash. Extremity: No gross deformity of bilateral upper extremities.  No gross deformity of                             lower extremities. Neurologic: Alert. Appropriate mood.    Studies:  No results found for this basename: HGB, WBC, PLT,  in the last 72 hours  No results found for this basename: NA, K, CL, CO2, BUN, CREATININE, CALCIUM, MAGNESIUM, GFRNONAA, GFRAA,  in the last 72 hours   No results found for this basename: PT, INR, APTT,  in the last 72 hours   No components found with this basename: ABG,     Assessment:  Malignant hydronephrosis of right kidney  Plan: Cystoscopy, right double J stent change

## 2014-06-25 ENCOUNTER — Encounter (HOSPITAL_BASED_OUTPATIENT_CLINIC_OR_DEPARTMENT_OTHER): Payer: Self-pay | Admitting: Urology

## 2014-07-03 ENCOUNTER — Telehealth: Payer: Self-pay | Admitting: Physician Assistant

## 2014-07-03 ENCOUNTER — Encounter: Payer: Self-pay | Admitting: Physician Assistant

## 2014-07-03 ENCOUNTER — Ambulatory Visit (HOSPITAL_BASED_OUTPATIENT_CLINIC_OR_DEPARTMENT_OTHER): Payer: Medicare Other

## 2014-07-03 ENCOUNTER — Ambulatory Visit (HOSPITAL_BASED_OUTPATIENT_CLINIC_OR_DEPARTMENT_OTHER): Payer: Medicare Other | Admitting: Physician Assistant

## 2014-07-03 ENCOUNTER — Ambulatory Visit: Payer: Medicare Other

## 2014-07-03 ENCOUNTER — Other Ambulatory Visit (HOSPITAL_BASED_OUTPATIENT_CLINIC_OR_DEPARTMENT_OTHER): Payer: Medicare Other

## 2014-07-03 VITALS — BP 119/59 | HR 67 | Temp 98.0°F | Resp 20 | Ht 68.0 in | Wt 232.4 lb

## 2014-07-03 DIAGNOSIS — C61 Malignant neoplasm of prostate: Secondary | ICD-10-CM

## 2014-07-03 DIAGNOSIS — Z95828 Presence of other vascular implants and grafts: Secondary | ICD-10-CM

## 2014-07-03 DIAGNOSIS — R609 Edema, unspecified: Secondary | ICD-10-CM

## 2014-07-03 DIAGNOSIS — Z5111 Encounter for antineoplastic chemotherapy: Secondary | ICD-10-CM

## 2014-07-03 DIAGNOSIS — E291 Testicular hypofunction: Secondary | ICD-10-CM

## 2014-07-03 LAB — CBC WITH DIFFERENTIAL/PLATELET
BASO%: 0.6 % (ref 0.0–2.0)
Basophils Absolute: 0 10*3/uL (ref 0.0–0.1)
EOS%: 1.3 % (ref 0.0–7.0)
Eosinophils Absolute: 0.1 10*3/uL (ref 0.0–0.5)
HCT: 29.8 % — ABNORMAL LOW (ref 38.4–49.9)
HEMOGLOBIN: 9.3 g/dL — AB (ref 13.0–17.1)
LYMPH#: 1.4 10*3/uL (ref 0.9–3.3)
LYMPH%: 19 % (ref 14.0–49.0)
MCH: 26.1 pg — ABNORMAL LOW (ref 27.2–33.4)
MCHC: 31.2 g/dL — AB (ref 32.0–36.0)
MCV: 83.5 fL (ref 79.3–98.0)
MONO#: 0.6 10*3/uL (ref 0.1–0.9)
MONO%: 8.8 % (ref 0.0–14.0)
NEUT#: 5 10*3/uL (ref 1.5–6.5)
NEUT%: 70.3 % (ref 39.0–75.0)
Platelets: 338 10*3/uL (ref 140–400)
RBC: 3.57 10*6/uL — ABNORMAL LOW (ref 4.20–5.82)
RDW: 17.7 % — ABNORMAL HIGH (ref 11.0–14.6)
WBC: 7.1 10*3/uL (ref 4.0–10.3)

## 2014-07-03 LAB — COMPREHENSIVE METABOLIC PANEL (CC13)
ALT: <6 U/L (ref 0–55)
ANION GAP: 7 meq/L (ref 3–11)
AST: 10 U/L (ref 5–34)
Albumin: 3.2 g/dL — ABNORMAL LOW (ref 3.5–5.0)
Alkaline Phosphatase: 59 U/L (ref 40–150)
BILIRUBIN TOTAL: 0.2 mg/dL (ref 0.20–1.20)
BUN: 15.4 mg/dL (ref 7.0–26.0)
CO2: 25 meq/L (ref 22–29)
CREATININE: 1.1 mg/dL (ref 0.7–1.3)
Calcium: 9 mg/dL (ref 8.4–10.4)
Chloride: 109 mEq/L (ref 98–109)
GLUCOSE: 113 mg/dL (ref 70–140)
Potassium: 4 mEq/L (ref 3.5–5.1)
Sodium: 141 mEq/L (ref 136–145)
TOTAL PROTEIN: 7.1 g/dL (ref 6.4–8.3)

## 2014-07-03 MED ORDER — DIPHENHYDRAMINE HCL 50 MG/ML IJ SOLN
INTRAMUSCULAR | Status: AC
Start: 2014-07-03 — End: 2014-07-03
  Filled 2014-07-03: qty 1

## 2014-07-03 MED ORDER — SODIUM CHLORIDE 0.9 % IV SOLN
Freq: Once | INTRAVENOUS | Status: AC
  Administered 2014-07-03: 13:00:00 via INTRAVENOUS

## 2014-07-03 MED ORDER — ONDANSETRON 8 MG/NS 50 ML IVPB
INTRAVENOUS | Status: AC
  Filled 2014-07-03: qty 8

## 2014-07-03 MED ORDER — SODIUM CHLORIDE 0.9 % IJ SOLN
10.0000 mL | INTRAMUSCULAR | Status: DC | PRN
  Administered 2014-07-03: 10 mL via INTRAVENOUS
  Filled 2014-07-03: qty 10

## 2014-07-03 MED ORDER — DEXAMETHASONE SODIUM PHOSPHATE 20 MG/5ML IJ SOLN
12.0000 mg | Freq: Once | INTRAMUSCULAR | Status: AC
  Administered 2014-07-03: 12 mg via INTRAVENOUS

## 2014-07-03 MED ORDER — FAMOTIDINE IN NACL 20-0.9 MG/50ML-% IV SOLN
INTRAVENOUS | Status: AC
  Filled 2014-07-03: qty 50

## 2014-07-03 MED ORDER — FAMOTIDINE IN NACL 20-0.9 MG/50ML-% IV SOLN
20.0000 mg | Freq: Once | INTRAVENOUS | Status: AC
  Administered 2014-07-03: 20 mg via INTRAVENOUS

## 2014-07-03 MED ORDER — HEPARIN SOD (PORK) LOCK FLUSH 100 UNIT/ML IV SOLN
500.0000 [IU] | Freq: Once | INTRAVENOUS | Status: AC | PRN
  Administered 2014-07-03: 500 [IU]
  Filled 2014-07-03: qty 5

## 2014-07-03 MED ORDER — DEXAMETHASONE SODIUM PHOSPHATE 20 MG/5ML IJ SOLN
INTRAMUSCULAR | Status: AC
  Filled 2014-07-03: qty 5

## 2014-07-03 MED ORDER — SODIUM CHLORIDE 0.9 % IJ SOLN
10.0000 mL | INTRAMUSCULAR | Status: DC | PRN
  Administered 2014-07-03: 10 mL
  Filled 2014-07-03: qty 10

## 2014-07-03 MED ORDER — DIPHENHYDRAMINE HCL 50 MG/ML IJ SOLN
25.0000 mg | Freq: Once | INTRAMUSCULAR | Status: AC
  Administered 2014-07-03: 25 mg via INTRAVENOUS

## 2014-07-03 MED ORDER — ONDANSETRON 8 MG/50ML IVPB (CHCC)
8.0000 mg | Freq: Once | INTRAVENOUS | Status: AC
  Administered 2014-07-03: 8 mg via INTRAVENOUS

## 2014-07-03 MED ORDER — DEXTROSE 5 % IV SOLN
60.0000 mg | Freq: Once | INTRAVENOUS | Status: AC
  Administered 2014-07-03: 60 mg via INTRAVENOUS
  Filled 2014-07-03: qty 6

## 2014-07-03 NOTE — Patient Instructions (Addendum)
[facility] Discharge Instructions for Patients Receiving Chemotherapy  Today you received the following chemotherapy agents Jevtana  To help prevent nausea and vomiting after your treatment, we encourage you to take your nausea medication   Zofran as directed   If you develop nausea and vomiting that is not controlled by your nausea medication, call the clinic.   BELOW ARE SYMPTOMS THAT SHOULD BE REPORTED IMMEDIATELY:  *FEVER GREATER THAN 100.5 F  *CHILLS WITH OR WITHOUT FEVER  NAUSEA AND VOMITING THAT IS NOT CONTROLLED WITH YOUR NAUSEA MEDICATION  *UNUSUAL SHORTNESS OF BREATH  *UNUSUAL BRUISING OR BLEEDING  TENDERNESS IN MOUTH AND THROAT WITH OR WITHOUT PRESENCE OF ULCERS  *URINARY PROBLEMS  *BOWEL PROBLEMS  UNUSUAL RASH Items with * indicate a potential emergency and should be followed up as soon as possible.  Feel free to call the clinic you have any questions or concerns. The clinic phone number is [phone].

## 2014-07-03 NOTE — Patient Instructions (Signed)

## 2014-07-03 NOTE — Telephone Encounter (Signed)
Pt confirmed labs/ov per 09/23 POF, gave pt AVS....KJ °

## 2014-07-03 NOTE — Progress Notes (Signed)
Hematology and Oncology Follow Up Visit  [name] [id number] 1945/12/03 68 y.o. 07/03/2014 4:46 PM   [name], [name]  [name], [name]  Principle Diagnosis:This is a 68 year old gentleman with prostate cancer initially diagnosed in 2001.  He had a Gleason score of 3 + 3 = 6.  He has castration-resistant disease with pelvic adenopathy   Prior Therapy:  1. Status post prostatectomy done in May 2001.  He had T3 N1 disease.  PSA nadir to 0.  2. The patient developed biochemical relapse and received prostatic bed radiation.  3. The patient received Lupron with Casodex due to rising PSA.  The patient subsequently developed castration-resistant disease. 4. Patient treated with Casodex withdrawal and subsequently PSA rose to 18. 5. Patient with Provenge immunotherapy completed in July 2011. 6.  He received ketoconazole and prednisone December 2011 through January 2014. 7.         He is S/P Zytiga 1000 mg daily with Prednisone 5 mg daily on 11/03/12 till 07/2013. This was stopped due to progression of disease.  8.  Xtandi 1000 mg daily 07/2013 through 01/24/2014. Discontinued secondary to disease progression 9. Docetaxel 75 mg/m2 with Neulasta support, status post 5 cycles. Discontinued 06/05/2014 secondary to disease progression.  Current therapy: 1. Systemic chemotherapy with Jevtana 25 mg/m2 given every 3 weeks. Status post 1 cycle. 2. He is on Lupron 30 mg every 4 months. He is due for an injection in 03/2014 and will be repeated again in 07/2014.   Interim History: [name] presents today for a followup visit with his wife. He tolerated his first cycle of Jevtana without difficulty. He has noted some lower extremity edema, especially on the left over the past 2 weeks. He denies trama. His wife cooks with a lot of salt and he salts his food. Since his last visit, he relates no new complaints. He is tolerating his chemotherapy and Neulasta without difficulty. He is not  reporting any nausea.  He is not reporting any vomiting. He denies any neuropathy or infusion related complications.  Denies any GI toxicities at this time.  Overall, his performance status and activity levels continue to be close to base line.  He continues to perform activities of daily living without any hindrance or decline.  Had not reported any genitourinary complaints or bleeding.  He has not reported any neurological complaints or decline in his quality of life. Denied seizures or decline in his exercise tolerance. He has not reported any lymphadenopathy or pruritus. He has not reported any petechiae or bleeding. Remainder of his review of systems unremarkable.    Medications: I have reviewed the patient's current medications. Current Outpatient Prescriptions  Medication Sig Dispense Refill  . amLODipine (NORVASC) 5 MG tablet Take 5 mg by mouth every evening.       [name] aspirin 81 MG tablet Take 81 mg by mouth daily.        . calcium carbonate (OS-CAL) 1250 MG chewable tablet Chew 2 tablets by mouth daily.      [name] leuprolide (LUPRON) 30 MG injection Inject 30 mg into the muscle every 4 (four) months. LAST INJECTION JUNE 2015      . lidocaine-prilocaine (EMLA) cream Apply topically as needed.  1 g  2  . losartan-hydrochlorothiazide (HYZAAR) 100-12.5 MG per tablet Take 1 tablet by mouth every morning.       . VESICARE 5 MG tablet       . HYDROcodone-acetaminophen (NORCO) 5-325 MG per tablet  Take 1-2 tablets by mouth every 4 (four) hours as needed for moderate pain.  30 tablet  0  . ondansetron (ZOFRAN) 8 MG tablet Take 8 mg by mouth every 8 (eight) hours as needed for nausea or vomiting.      . sulfamethoxazole-trimethoprim (BACTRIM DS) 800-160 MG per tablet Take 1 tablet by mouth 2 (two) times daily.  6 tablet  0   No current facility-administered medications for this visit.   Facility-Administered Medications Ordered in Other Visits  Medication Dose Route Frequency Provider Last Rate Last  Dose  . sodium chloride 0.9 % injection 10 mL  10 mL Intracatheter PRN [name], MD   10 mL at 07/03/14 1519    Allergies: No Known Allergies  Past Medical History, Surgical history, Social history, and Family History were reviewed and updated.    Physical Exam: Blood pressure 119/59, pulse 67, temperature 98 F (36.7 C), temperature source Oral, resp. rate 20, height 5\' 8"  (1.727 m), weight 232 lb 6.4 oz (105.416 kg), SpO2 100.00%. ECOG: 1 General appearance: alert awake appeared in no active distress. Head: Normocephalic, without obvious abnormality, atraumatic Neck: no adenopathy Lymph nodes: Cervical, supraclavicular, and axillary nodes normal. Heart:regular rate and rhythm, S1, S2 normal, no murmur, click, rub or gallop Lung:chest clear, no wheezing, rales, normal symmetric air entry. Abdomen: soft, non-tender, without masses or organomegaly EXT:1+ to 2+ pitting edema noted bilateral lower extremities,left greater than right. Right anterior chest porta cath, well healed, non-tender, no evidence of infection  Lab Results: Lab Results  Component Value Date   WBC 7.1 07/03/2014   HGB 9.3* 07/03/2014   HCT 29.8* 07/03/2014   MCV 83.5 07/03/2014   PLT 338 07/03/2014     Chemistry      Component Value Date/Time   NA 141 07/03/2014 1105   NA 138 06/24/2014 0729   K 4.0 07/03/2014 1105   K 3.9 06/24/2014 0729   CL 104 02/06/2014 1005   CL 108* 03/09/2013 0923   CO2 25 07/03/2014 1105   CO2 26 02/06/2014 1005   BUN 15.4 07/03/2014 1105   BUN 12 02/06/2014 1005   CREATININE 1.1 07/03/2014 1105   CREATININE 0.89 02/06/2014 1005      Component Value Date/Time   CALCIUM 9.0 07/03/2014 1105   CALCIUM 9.4 02/06/2014 1005   ALKPHOS 59 07/03/2014 1105   ALKPHOS 71 12/27/2012 0415   AST 10 07/03/2014 1105   AST 17 12/27/2012 0415   ALT <6 07/03/2014 1105   ALT 24 12/27/2012 0415   BILITOT 0.20 07/03/2014 1105   BILITOT 0.3 12/27/2012 0415      Results for EINER, MEALS (MRN  [id number]) as of 06/05/2014 10:29  Ref. Range 04/22/2014 09:40 05/10/2014 09:18 06/04/2014 14:35  PSA Latest Range: <=4.00 ng/mL 484.80 (H) 638.60 (H) 642.20 (H)      EXAM:  CT CHEST, ABDOMEN, AND PELVIS WITH CONTRAST  TECHNIQUE:  Multidetector CT imaging of the chest, abdomen and pelvis was  performed following the standard protocol during bolus  administration of intravenous contrast.  CONTRAST: 138mL OMNIPAQUE IOHEXOL 300 MG/ML SOLN  COMPARISON: CT chest/ abdomen/ pelvis 01/22/2014  FINDINGS:  CT CHEST FINDINGS  Substernal goiter reidentified. Great vessels are normal in caliber.  Trace fluid in the superior pericardial recess is noted. Heart size  is normal. No pericardial or pleural effusion. Mild bilateral  gynecomastia noted. No thoracic lymphadenopathy. Right-sided  Port-A-Cath in place with tip in the distal SVC. No significant  change in  4 mm right lower lobe superior segment pulmonary nodule  image 39. Subpleural left upper lobe 4 mm nodule image 23 is noted.  Central airways are patent. No new pulmonary mass, nodule, or  consolidation.  CT ABDOMEN AND PELVIS FINDINGS  Exophytic left upper renal pole cortical mass is larger than  previously, now 3.9 x 3.7 cm image 56, previously 3.8 x 3.4 cm.  Bilateral renal cortical probable cysts are reidentified. No left  hydroureteronephrosis is identified. Interval increase in now severe  right hydroureteronephrosis with a nephroureteral stent in place.  Increased bulky confluent retroperitoneal lymphadenopathy is  identified, now 3.6 by 15.1 cm image 67.  Bulky gastrohepatic and porta hepatis lymphadenopathy is also  larger, representative conglomerate lymph node abutting the gastric  lesser curvature measuring 3.0 cm image 54 compared to 2.6 cm  previously.  Liver, decompressed gallbladder, spleen are unremarkable. The  conglomerate nodal mass directly abuts the pancreas but the pancreas  appears otherwise normal allowing for  suboptimal visualization.  Multiple nodules are noted in the region of the left adrenal gland  and could involve the gland itself but are not individually  separable visually.  Moderate atheromatous aortic calcification. Small ascites tracks  throughout the mesentery. No bowel wall thickening or focal  segmental dilatation. Small fat containing left inguinal hernia with  evidence of herniorrhaphy. Penile implant partly visualized. The  lymphadenopathy extends along the pelvic sidewalls and iliac  vessels, also increased since previously. Left inguinal node is  larger, now 1.2 cm image 101. Degenerative change is noted in the  spine. No lytic or sclerotic osseous lesion.  IMPRESSION:  No new evidence for intrathoracic metastatic disease.  Increased intra-abdominal, retroperitoneal, and pelvic  lymphadenopathy as above, compatible with progression of metastatic  disease.  Increase in size of left upper renal pole mass highly suspicious for  renal cell carcinoma. Metastasis could appear similar but is  statistically less common in the setting of prostate cancer.  Increased now severe right hydroureteronephrosis with never ureteral  stent in place, likely due to mass effect by retroperitoneal  lymphadenopathy.   Impression and Plan:  This is a 68 year old gentleman with the following issues: 1. Castration-resistant prostate cancer with pelvic adenopathy. He is now on systemic chemotherapy in the form of docetaxel 75 mg per meter squared given every 3 weeks with Neulasta support. He is status post 5 cycles.  His PSA continues to rise up to 642 and his CT scan shows progression of disease. His treatment with docetaxel was discontinued secondary to disease progression. He is now being treated with Jevtana chemotherapy with Neulasta support, now status post 1 cycle. He will proceed with cycle #2 today as scheduled. He will receive Neulasta injection after each chemo. 2. IV access: Status post  right anterior chest Port-A-Cath placement on 02/06/2014. Continue EMLA cream as needed. No complications at this time. 3. Hydronephrosis: He is S/P stent placement. 4. Sepsis and thrombocytopenia: Resolved now.  5. Hormonal deprivation.  He continues to be on Lupron. Last given 03/06/2014. He will receive Lupron next in October 2015. 6. 3.8 cm enhancing mass in the upper pole of the left kidney likely renal cell carcinoma. We will continue  observation and will followup on at with his next CT scan. 7. Lower extremity edema-patient advised to decrease sodium intake. We will monitor closely on subsequent visits. 8. Followup will be in in 3 week for the start of cycle 3 Jevtana chemotherapy.   Carlton Adam, [place]  9/23/20154:46 PM

## 2014-07-04 ENCOUNTER — Ambulatory Visit (HOSPITAL_BASED_OUTPATIENT_CLINIC_OR_DEPARTMENT_OTHER): Payer: Medicare Other

## 2014-07-04 ENCOUNTER — Telehealth: Payer: Self-pay | Admitting: Medical Oncology

## 2014-07-04 VITALS — BP 122/62 | HR 75 | Temp 99.0°F

## 2014-07-04 DIAGNOSIS — Z5189 Encounter for other specified aftercare: Secondary | ICD-10-CM

## 2014-07-04 DIAGNOSIS — C61 Malignant neoplasm of prostate: Secondary | ICD-10-CM

## 2014-07-04 LAB — PSA: PSA: 1136 ng/mL — ABNORMAL HIGH (ref <=4.00)

## 2014-07-04 MED ORDER — PEGFILGRASTIM INJECTION 6 MG/0.6ML
6.0000 mg | Freq: Once | SUBCUTANEOUS | Status: AC
  Administered 2014-07-04: 6 mg via SUBCUTANEOUS
  Filled 2014-07-04: qty 0.6

## 2014-07-04 MED ORDER — LEUPROLIDE ACETATE (4 MONTH) 30 MG IM KIT
30.0000 mg | PACK | Freq: Once | INTRAMUSCULAR | Status: DC
  Filled 2014-07-04: qty 30

## 2014-07-04 NOTE — Telephone Encounter (Signed)
Wife called stating patient had to work today and won't be able to make his 0930 injection appt. States he will be in by 4.    Injection room nurse informed.

## 2014-07-07 NOTE — Patient Instructions (Signed)
Decrease your salt intake. Drink more water Follow up in 3 weeks, prior to your next cycle of chemotherapy You will be due for Lupron injection when you return in 3 weeks

## 2014-07-10 ENCOUNTER — Encounter: Payer: Self-pay | Admitting: *Deleted

## 2014-07-10 ENCOUNTER — Ambulatory Visit (HOSPITAL_BASED_OUTPATIENT_CLINIC_OR_DEPARTMENT_OTHER): Payer: Medicare Other

## 2014-07-10 VITALS — BP 104/53 | HR 81 | Temp 98.1°F

## 2014-07-10 DIAGNOSIS — C61 Malignant neoplasm of prostate: Secondary | ICD-10-CM

## 2014-07-10 DIAGNOSIS — Z5111 Encounter for antineoplastic chemotherapy: Secondary | ICD-10-CM

## 2014-07-10 MED ORDER — LEUPROLIDE ACETATE (4 MONTH) 30 MG IM KIT
30.0000 mg | PACK | Freq: Once | INTRAMUSCULAR | Status: AC
  Administered 2014-07-10: 30 mg via INTRAMUSCULAR
  Filled 2014-07-10: qty 30

## 2014-07-10 NOTE — Patient Instructions (Signed)

## 2014-07-23 ENCOUNTER — Other Ambulatory Visit (HOSPITAL_BASED_OUTPATIENT_CLINIC_OR_DEPARTMENT_OTHER): Payer: Medicare Other

## 2014-07-23 DIAGNOSIS — C61 Malignant neoplasm of prostate: Secondary | ICD-10-CM

## 2014-07-23 LAB — CBC WITH DIFFERENTIAL/PLATELET
BASO%: 1.1 % (ref 0.0–2.0)
Basophils Absolute: 0.1 10*3/uL (ref 0.0–0.1)
EOS%: 0.9 % (ref 0.0–7.0)
Eosinophils Absolute: 0.1 10*3/uL (ref 0.0–0.5)
HCT: 29.9 % — ABNORMAL LOW (ref 38.4–49.9)
HGB: 9.3 g/dL — ABNORMAL LOW (ref 13.0–17.1)
LYMPH%: 17.1 % (ref 14.0–49.0)
MCH: 26.1 pg — ABNORMAL LOW (ref 27.2–33.4)
MCHC: 31.2 g/dL — ABNORMAL LOW (ref 32.0–36.0)
MCV: 83.8 fL (ref 79.3–98.0)
MONO#: 0.7 10*3/uL (ref 0.1–0.9)
MONO%: 8.1 % (ref 0.0–14.0)
NEUT#: 6.3 10*3/uL (ref 1.5–6.5)
NEUT%: 72.8 % (ref 39.0–75.0)
Platelets: 224 10*3/uL (ref 140–400)
RBC: 3.57 10*6/uL — ABNORMAL LOW (ref 4.20–5.82)
RDW: 19 % — ABNORMAL HIGH (ref 11.0–14.6)
WBC: 8.7 10*3/uL (ref 4.0–10.3)
lymph#: 1.5 10*3/uL (ref 0.9–3.3)

## 2014-07-23 LAB — COMPREHENSIVE METABOLIC PANEL (CC13)
ALT: 9 U/L (ref 0–55)
AST: 10 U/L (ref 5–34)
Albumin: 3.3 g/dL — ABNORMAL LOW (ref 3.5–5.0)
Alkaline Phosphatase: 62 U/L (ref 40–150)
Anion Gap: 9 meq/L (ref 3–11)
BUN: 15.3 mg/dL (ref 7.0–26.0)
CO2: 26 meq/L (ref 22–29)
Calcium: 9.3 mg/dL (ref 8.4–10.4)
Chloride: 110 meq/L — ABNORMAL HIGH (ref 98–109)
Creatinine: 1 mg/dL (ref 0.7–1.3)
Glucose: 115 mg/dL (ref 70–140)
Potassium: 4.4 meq/L (ref 3.5–5.1)
Sodium: 145 meq/L (ref 136–145)
Total Bilirubin: 0.2 mg/dL (ref 0.20–1.20)
Total Protein: 6.7 g/dL (ref 6.4–8.3)

## 2014-07-24 ENCOUNTER — Other Ambulatory Visit: Payer: Medicare Other

## 2014-07-24 ENCOUNTER — Telehealth: Payer: Self-pay | Admitting: Oncology

## 2014-07-24 ENCOUNTER — Ambulatory Visit (HOSPITAL_BASED_OUTPATIENT_CLINIC_OR_DEPARTMENT_OTHER): Payer: Medicare Other

## 2014-07-24 ENCOUNTER — Ambulatory Visit (HOSPITAL_BASED_OUTPATIENT_CLINIC_OR_DEPARTMENT_OTHER): Payer: Medicare Other | Admitting: Oncology

## 2014-07-24 VITALS — BP 121/68 | HR 80 | Temp 97.7°F | Resp 20 | Ht 68.0 in | Wt 235.4 lb

## 2014-07-24 DIAGNOSIS — Z5111 Encounter for antineoplastic chemotherapy: Secondary | ICD-10-CM | POA: Diagnosis not present

## 2014-07-24 DIAGNOSIS — E291 Testicular hypofunction: Secondary | ICD-10-CM

## 2014-07-24 DIAGNOSIS — C61 Malignant neoplasm of prostate: Secondary | ICD-10-CM

## 2014-07-24 DIAGNOSIS — N289 Disorder of kidney and ureter, unspecified: Secondary | ICD-10-CM

## 2014-07-24 LAB — PSA: PSA: 811.6 ng/mL — AB (ref <=4.00)

## 2014-07-24 MED ORDER — ONDANSETRON 8 MG/NS 50 ML IVPB
INTRAVENOUS | Status: AC
  Filled 2014-07-24: qty 8

## 2014-07-24 MED ORDER — SODIUM CHLORIDE 0.9 % IJ SOLN
10.0000 mL | INTRAMUSCULAR | Status: DC | PRN
  Administered 2014-07-24: 10 mL
  Filled 2014-07-24: qty 10

## 2014-07-24 MED ORDER — FAMOTIDINE IN NACL 20-0.9 MG/50ML-% IV SOLN
INTRAVENOUS | Status: AC
Start: 2014-07-24 — End: 2014-07-24
  Filled 2014-07-24: qty 50

## 2014-07-24 MED ORDER — HEPARIN SOD (PORK) LOCK FLUSH 100 UNIT/ML IV SOLN
500.0000 [IU] | Freq: Once | INTRAVENOUS | Status: AC | PRN
  Administered 2014-07-24: 500 [IU]
  Filled 2014-07-24: qty 5

## 2014-07-24 MED ORDER — DIPHENHYDRAMINE HCL 50 MG/ML IJ SOLN
25.0000 mg | Freq: Once | INTRAMUSCULAR | Status: AC
  Administered 2014-07-24: 25 mg via INTRAVENOUS

## 2014-07-24 MED ORDER — DIPHENHYDRAMINE HCL 50 MG/ML IJ SOLN
INTRAMUSCULAR | Status: AC
  Filled 2014-07-24: qty 1

## 2014-07-24 MED ORDER — ONDANSETRON 8 MG/50ML IVPB (CHCC)
8.0000 mg | Freq: Once | INTRAVENOUS | Status: AC
  Administered 2014-07-24: 8 mg via INTRAVENOUS

## 2014-07-24 MED ORDER — DEXAMETHASONE SODIUM PHOSPHATE 20 MG/5ML IJ SOLN
12.0000 mg | Freq: Once | INTRAMUSCULAR | Status: AC
  Administered 2014-07-24: 12 mg via INTRAVENOUS

## 2014-07-24 MED ORDER — CABAZITAXEL CHEMO INJECTION 60 MG/6ML W/DILUENT
26.0000 mg/m2 | Freq: Once | INTRAVENOUS | Status: AC
  Administered 2014-07-24: 60 mg via INTRAVENOUS
  Filled 2014-07-24: qty 6

## 2014-07-24 MED ORDER — SODIUM CHLORIDE 0.9 % IV SOLN
Freq: Once | INTRAVENOUS | Status: AC
  Administered 2014-07-24: 12:00:00 via INTRAVENOUS

## 2014-07-24 MED ORDER — FAMOTIDINE IN NACL 20-0.9 MG/50ML-% IV SOLN
20.0000 mg | Freq: Once | INTRAVENOUS | Status: AC
  Administered 2014-07-24: 20 mg via INTRAVENOUS

## 2014-07-24 MED ORDER — DEXAMETHASONE SODIUM PHOSPHATE 20 MG/5ML IJ SOLN
INTRAMUSCULAR | Status: AC
  Filled 2014-07-24: qty 5

## 2014-07-24 NOTE — Telephone Encounter (Signed)
gv adn pritneda ppt sched and avs for pt for OCT and NOV...sed added tx.

## 2014-07-24 NOTE — Progress Notes (Signed)
Hematology and Oncology Follow Up Visit  [name] [id number] [birth year] 68 y.o. 07/24/2014 10:28 AM   [name], [name]  [name] [name], [name]  Principle Diagnosis:This is a 68 year old gentleman with prostate cancer initially diagnosed in 2001.  He had a Gleason score of 3 + 3 = 6.  He has castration-resistant disease with pelvic adenopathy   Prior Therapy:  1. Status post prostatectomy done in May 2001.  He had T3 N1 disease.  PSA nadir to 0.  2. The patient developed biochemical relapse and received prostatic bed radiation.  3. The patient received Lupron with Casodex due to rising PSA.  The patient subsequently developed castration-resistant disease. 4. Patient treated with Casodex withdrawal and subsequently PSA rose to 18. 5. Patient with Provenge immunotherapy completed in July 2011. 6.  He received ketoconazole and prednisone December 2011 through January 2014. 7.         He is S/P Zytiga 1000 mg daily with Prednisone 5 mg daily on 11/03/12 till 07/2013. This was stopped due to progression of disease.  8.  Xtandi 1000 mg daily 07/2013 through 01/24/2014. Discontinued secondary to disease progression 9.         Docetaxel 75 mg/m2 with Neulasta support, status post 5 cycles. Discontinued 06/05/2014 secondary to disease progression.  Current therapy: 1. Systemic chemotherapy with Jevtana 25 mg/m2 given every 3 weeks. Status post 2 cycles. 2. He is on Lupron 30 mg every 4 months. Due in 07/2014.   Interim History: [name] presents today for a followup visit with his wife. He tolerated his first 2 cycles of Jevtana without difficulty. He has noted some lower extremity edema which has not changed. Otherwise, he reported  no new complaints. He is tolerating his chemotherapy and Neulasta without difficulty. He is not reporting any nausea.  He is not reporting any vomiting. He denies any neuropathy or infusion related complications.  Denies any GI toxicities at this  time.  Overall, his performance status and activity levels continue to be close to base line.  He continues to perform activities of daily living without any hindrance or decline.  Had not reported any genitourinary complaints or bleeding.  He has not reported any neurological complaints or decline in his quality of life. Denied seizures or decline in his exercise tolerance. He has not reported any lymphadenopathy or pruritus. He has not reported any petechiae or bleeding.Remainder of his review of systems unremarkable.    Medications: I have reviewed the patient's current medications. Current Outpatient Prescriptions  Medication Sig Dispense Refill  . amLODipine (NORVASC) 5 MG tablet Take 5 mg by mouth every evening.       [name] aspirin 81 MG tablet Take 81 mg by mouth daily.        . calcium carbonate (OS-CAL) 1250 MG chewable tablet Chew 2 tablets by mouth daily.      [name] HYDROcodone-acetaminophen (NORCO) 5-325 MG per tablet Take 1-2 tablets by mouth every 4 (four) hours as needed for moderate pain.  30 tablet  0  . leuprolide (LUPRON) 30 MG injection Inject 30 mg into the muscle every 4 (four) months. LAST INJECTION JUNE 2015      . lidocaine-prilocaine (EMLA) cream Apply topically as needed.  1 g  2  . losartan-hydrochlorothiazide (HYZAAR) 100-12.5 MG per tablet Take 1 tablet by mouth every morning.       . ondansetron (ZOFRAN) 8 MG tablet Take 8 mg by mouth every 8 (eight) hours as needed for  nausea or vomiting.      . sulfamethoxazole-trimethoprim (BACTRIM DS) 800-160 MG per tablet Take 1 tablet by mouth 2 (two) times daily.  6 tablet  0  . VESICARE 5 MG tablet        No current facility-administered medications for this visit.    Allergies: No Known Allergies  Past Medical History, Surgical history, Social history, and Family History were reviewed and updated.    Physical Exam: Blood pressure 121/68, pulse 80, temperature 97.7 F (36.5 C), temperature source Oral, resp. rate 20, height  5\' 8"  (1.727 m), weight 235 lb 6.4 oz (106.777 kg), SpO2 100.00%. ECOG: 1 General appearance: alert awake appeared in no active distress. Head: Normocephalic, without obvious abnormality, atraumatic Neck: no adenopathy Lymph nodes: Cervical, supraclavicular, and axillary nodes normal. Heart:regular rate and rhythm, S1, S2 normal, no murmur, click, rub or gallop Lung:chest clear, no wheezing, rales, normal symmetric air entry. Chest wall examination: Right anterior chest porta cath, well healed, non-tender, no evidence of infection  Abdomen: soft, non-tender, without masses or organomegaly EXT:1+ edema.   Lab Results: Lab Results  Component Value Date   WBC 8.7 07/23/2014   HGB 9.3* 07/23/2014   HCT 29.9* 07/23/2014   MCV 83.8 07/23/2014   PLT 224 07/23/2014     Chemistry      Component Value Date/Time   NA 145 07/23/2014 0918   NA 138 06/24/2014 0729   K 4.4 07/23/2014 0918   K 3.9 06/24/2014 0729   CL 104 02/06/2014 1005   CL 108* 03/09/2013 0923   CO2 26 07/23/2014 0918   CO2 26 02/06/2014 1005   BUN 15.3 07/23/2014 0918   BUN 12 02/06/2014 1005   CREATININE 1.0 07/23/2014 0918   CREATININE 0.89 02/06/2014 1005      Component Value Date/Time   CALCIUM 9.3 07/23/2014 0918   CALCIUM 9.4 02/06/2014 1005   ALKPHOS 62 07/23/2014 0918   ALKPHOS 71 12/27/2012 0415   AST 10 07/23/2014 0918   AST 17 12/27/2012 0415   ALT 9 07/23/2014 0918   ALT 24 12/27/2012 0415   BILITOT 0.20 07/23/2014 0918   BILITOT 0.3 12/27/2012 0415      Results for [name] (MRN [id number]) as of 07/24/2014 10:14  Ref. Range 07/03/2014 11:06 07/23/2014 09:18  PSA Latest Range: <=4.00 ng/mL 1136.00 (H) 811.60 (H)    Impression and Plan:  This is a 68 year old gentleman with the following issues: 1. Castration-resistant prostate cancer with pelvic adenopathy. He is now on systemic chemotherapy in the form  Jevtana  with Neulasta support. He is now status post 2 cycles and chemotherapy has been  well tolerated. His PSA also dropped to 811 after 2 cycles of chemotherapy. I plan on continuing chemotherapy with the same dose and schedule every 3 weeks. He prefers to have his labs done the day before chemotherapy. 2. IV access: Status post right anterior chest Port-A-Cath placement on 02/06/2014. Continue EMLA cream as needed. No complications at this time. 3. Hydronephrosis: He is S/P stent placement. 4. Sepsis and thrombocytopenia: Resolved now.  5. Hormonal deprivation.  He continues to be on Lupron. Last given 03/06/2014. He will receive Lupron next in October 2015. 6. 3.8 cm enhancing mass in the upper pole of the left kidney likely renal cell carcinoma. We will continue  observation and will followup on at with his next CT scan. 7. Lower extremity edema-patient advised to decrease sodium intake. We will monitor closely on subsequent visits. 8. Followup  will be in in 3 week for the start of cycle 4 Jevtana chemotherapy.   [facility], MD 10/14/201510:28 AM

## 2014-07-24 NOTE — Patient Instructions (Signed)
[facility] Discharge Instructions for Patients Receiving Chemotherapy  Today you received the following chemotherapy agents :  Jevtana.  To help prevent nausea and vomiting after your treatment, we encourage you to take your nausea medication as prescribed by your physician.   If you develop nausea and vomiting that is not controlled by your nausea medication, call the clinic.   BELOW ARE SYMPTOMS THAT SHOULD BE REPORTED IMMEDIATELY:  *FEVER GREATER THAN 100.5 F  *CHILLS WITH OR WITHOUT FEVER  NAUSEA AND VOMITING THAT IS NOT CONTROLLED WITH YOUR NAUSEA MEDICATION  *UNUSUAL SHORTNESS OF BREATH  *UNUSUAL BRUISING OR BLEEDING  TENDERNESS IN MOUTH AND THROAT WITH OR WITHOUT PRESENCE OF ULCERS  *URINARY PROBLEMS  *BOWEL PROBLEMS  UNUSUAL RASH Items with * indicate a potential emergency and should be followed up as soon as possible.  Feel free to call the clinic you have any questions or concerns. The clinic phone number is [phone].

## 2014-07-25 ENCOUNTER — Ambulatory Visit (HOSPITAL_BASED_OUTPATIENT_CLINIC_OR_DEPARTMENT_OTHER): Payer: Medicare Other

## 2014-07-25 VITALS — BP 123/95 | HR 71 | Temp 98.0°F

## 2014-07-25 DIAGNOSIS — Z5189 Encounter for other specified aftercare: Secondary | ICD-10-CM

## 2014-07-25 DIAGNOSIS — C61 Malignant neoplasm of prostate: Secondary | ICD-10-CM

## 2014-07-25 MED ORDER — PEGFILGRASTIM INJECTION 6 MG/0.6ML
6.0000 mg | Freq: Once | SUBCUTANEOUS | Status: AC
  Administered 2014-07-25: 6 mg via SUBCUTANEOUS
  Filled 2014-07-25: qty 0.6

## 2014-07-25 NOTE — Patient Instructions (Signed)
Pegfilgrastim injection What is this medicine? PEGFILGRASTIM (peg fil GRA stim) is a long-acting granulocyte colony-stimulating factor that stimulates the growth of neutrophils, a type of white blood cell important in the body's fight against infection. It is used to reduce the incidence of fever and infection in patients with certain types of cancer who are receiving chemotherapy that affects the bone marrow. This medicine may be used for other purposes; ask your health care provider or pharmacist if you have questions. COMMON BRAND NAME(S): Neulasta What should I tell my health care provider before I take this medicine? They need to know if you have any of these conditions: -latex allergy -ongoing radiation therapy -sickle cell disease -skin reactions to acrylic adhesives (On-Body Injector only) -an unusual or allergic reaction to pegfilgrastim, filgrastim, other medicines, foods, dyes, or preservatives -pregnant or trying to get pregnant -breast-feeding How should I use this medicine? This medicine is for injection under the skin. If you get this medicine at home, you will be taught how to prepare and give the pre-filled syringe or how to use the On-body Injector. Refer to the patient Instructions for Use for detailed instructions. Use exactly as directed. Take your medicine at regular intervals. Do not take your medicine more often than directed. It is important that you put your used needles and syringes in a special sharps container. Do not put them in a trash can. If you do not have a sharps container, call your pharmacist or healthcare provider to get one. Talk to your pediatrician regarding the use of this medicine in children. Special care may be needed. Overdosage: If you think you have taken too much of this medicine contact a poison control center or emergency room at once. NOTE: This medicine is only for you. Do not share this medicine with others. What if I miss a dose? It is  important not to miss your dose. Call your doctor or health care professional if you miss your dose. If you miss a dose due to an On-body Injector failure or leakage, a new dose should be administered as soon as possible using a single prefilled syringe for manual use. What may interact with this medicine? Interactions have not been studied. Give your health care provider a list of all the medicines, herbs, non-prescription drugs, or dietary supplements you use. Also tell them if you smoke, drink alcohol, or use illegal drugs. Some items may interact with your medicine. This list may not describe all possible interactions. Give your health care provider a list of all the medicines, herbs, non-prescription drugs, or dietary supplements you use. Also tell them if you smoke, drink alcohol, or use illegal drugs. Some items may interact with your medicine. What should I watch for while using this medicine? You may need blood work done while you are taking this medicine. If you are going to need a MRI, CT scan, or other procedure, tell your doctor that you are using this medicine (On-Body Injector only). What side effects may I notice from receiving this medicine? Side effects that you should report to your doctor or health care professional as soon as possible: -allergic reactions like skin rash, itching or hives, swelling of the face, lips, or tongue -dizziness -fever -pain, redness, or irritation at site where injected -pinpoint red spots on the skin -shortness of breath or breathing problems -stomach or side pain, or pain at the shoulder -swelling -tiredness -trouble passing urine Side effects that usually do not require medical attention (report to your doctor   or health care professional if they continue or are bothersome): -bone pain -muscle pain This list may not describe all possible side effects. Call your doctor for medical advice about side effects. You may report side effects to FDA at  1-800-FDA-1088. Where should I keep my medicine? Keep out of the reach of children. Store pre-filled syringes in a refrigerator between 2 and 8 degrees C (36 and 46 degrees F). Do not freeze. Keep in carton to protect from light. Throw away this medicine if it is left out of the refrigerator for more than 48 hours. Throw away any unused medicine after the expiration date. NOTE: This sheet is a summary. It may not cover all possible information. If you have questions about this medicine, talk to your doctor, pharmacist, or health care provider.  2015, [employer]. (2013-12-27 16:14:05)  

## 2014-08-14 ENCOUNTER — Encounter: Payer: Self-pay | Admitting: Adult Health

## 2014-08-14 ENCOUNTER — Other Ambulatory Visit (HOSPITAL_BASED_OUTPATIENT_CLINIC_OR_DEPARTMENT_OTHER): Payer: Medicare Other

## 2014-08-14 ENCOUNTER — Ambulatory Visit (HOSPITAL_BASED_OUTPATIENT_CLINIC_OR_DEPARTMENT_OTHER): Payer: Medicare Other

## 2014-08-14 ENCOUNTER — Ambulatory Visit: Payer: Medicare Other

## 2014-08-14 ENCOUNTER — Ambulatory Visit (HOSPITAL_BASED_OUTPATIENT_CLINIC_OR_DEPARTMENT_OTHER): Payer: Medicare Other | Admitting: Adult Health

## 2014-08-14 VITALS — BP 126/67 | HR 73 | Temp 98.4°F | Resp 18 | Ht 68.0 in | Wt 234.3 lb

## 2014-08-14 DIAGNOSIS — N1330 Unspecified hydronephrosis: Secondary | ICD-10-CM

## 2014-08-14 DIAGNOSIS — R609 Edema, unspecified: Secondary | ICD-10-CM

## 2014-08-14 DIAGNOSIS — Z95828 Presence of other vascular implants and grafts: Secondary | ICD-10-CM

## 2014-08-14 DIAGNOSIS — C61 Malignant neoplasm of prostate: Secondary | ICD-10-CM

## 2014-08-14 DIAGNOSIS — Z5111 Encounter for antineoplastic chemotherapy: Secondary | ICD-10-CM

## 2014-08-14 DIAGNOSIS — G629 Polyneuropathy, unspecified: Secondary | ICD-10-CM

## 2014-08-14 LAB — CBC WITH DIFFERENTIAL/PLATELET
BASO%: 1.2 % (ref 0.0–2.0)
Basophils Absolute: 0.1 10*3/uL (ref 0.0–0.1)
EOS ABS: 0.2 10*3/uL (ref 0.0–0.5)
EOS%: 2.5 % (ref 0.0–7.0)
HCT: 29.7 % — ABNORMAL LOW (ref 38.4–49.9)
HGB: 9.3 g/dL — ABNORMAL LOW (ref 13.0–17.1)
LYMPH#: 1.3 10*3/uL (ref 0.9–3.3)
LYMPH%: 18.4 % (ref 14.0–49.0)
MCH: 25.9 pg — ABNORMAL LOW (ref 27.2–33.4)
MCHC: 31.3 g/dL — AB (ref 32.0–36.0)
MCV: 82.5 fL (ref 79.3–98.0)
MONO#: 0.7 10*3/uL (ref 0.1–0.9)
MONO%: 9.5 % (ref 0.0–14.0)
NEUT%: 68.4 % (ref 39.0–75.0)
NEUTROS ABS: 4.8 10*3/uL (ref 1.5–6.5)
Platelets: 228 10*3/uL (ref 140–400)
RBC: 3.6 10*6/uL — ABNORMAL LOW (ref 4.20–5.82)
RDW: 20.9 % — AB (ref 11.0–14.6)
WBC: 7 10*3/uL (ref 4.0–10.3)

## 2014-08-14 LAB — COMPREHENSIVE METABOLIC PANEL (CC13)
ALBUMIN: 3.5 g/dL (ref 3.5–5.0)
ALT: 8 U/L (ref 0–55)
ANION GAP: 9 meq/L (ref 3–11)
AST: 12 U/L (ref 5–34)
Alkaline Phosphatase: 61 U/L (ref 40–150)
BILIRUBIN TOTAL: 0.3 mg/dL (ref 0.20–1.20)
BUN: 16.1 mg/dL (ref 7.0–26.0)
CHLORIDE: 112 meq/L — AB (ref 98–109)
CO2: 23 meq/L (ref 22–29)
Calcium: 8.8 mg/dL (ref 8.4–10.4)
Creatinine: 0.9 mg/dL (ref 0.7–1.3)
GLUCOSE: 115 mg/dL (ref 70–140)
POTASSIUM: 4.2 meq/L (ref 3.5–5.1)
SODIUM: 144 meq/L (ref 136–145)
TOTAL PROTEIN: 6.5 g/dL (ref 6.4–8.3)

## 2014-08-14 MED ORDER — SODIUM CHLORIDE 0.9 % IV SOLN
Freq: Once | INTRAVENOUS | Status: AC
  Administered 2014-08-14: 12:00:00 via INTRAVENOUS

## 2014-08-14 MED ORDER — ONDANSETRON 8 MG/NS 50 ML IVPB
INTRAVENOUS | Status: AC
  Filled 2014-08-14: qty 8

## 2014-08-14 MED ORDER — DIPHENHYDRAMINE HCL 50 MG/ML IJ SOLN
INTRAMUSCULAR | Status: AC
  Filled 2014-08-14: qty 1

## 2014-08-14 MED ORDER — ONDANSETRON 8 MG/50ML IVPB (CHCC)
8.0000 mg | Freq: Once | INTRAVENOUS | Status: AC
  Administered 2014-08-14: 8 mg via INTRAVENOUS

## 2014-08-14 MED ORDER — SODIUM CHLORIDE 0.9 % IJ SOLN
10.0000 mL | INTRAMUSCULAR | Status: DC | PRN
  Administered 2014-08-14: 10 mL via INTRAVENOUS
  Filled 2014-08-14: qty 10

## 2014-08-14 MED ORDER — DIPHENHYDRAMINE HCL 50 MG/ML IJ SOLN
25.0000 mg | Freq: Once | INTRAMUSCULAR | Status: AC
  Administered 2014-08-14: 25 mg via INTRAVENOUS

## 2014-08-14 MED ORDER — SODIUM CHLORIDE 0.9 % IJ SOLN
10.0000 mL | INTRAMUSCULAR | Status: DC | PRN
  Administered 2014-08-14: 10 mL
  Filled 2014-08-14: qty 10

## 2014-08-14 MED ORDER — HEPARIN SOD (PORK) LOCK FLUSH 100 UNIT/ML IV SOLN
500.0000 [IU] | Freq: Once | INTRAVENOUS | Status: AC | PRN
  Administered 2014-08-14: 500 [IU]
  Filled 2014-08-14: qty 5

## 2014-08-14 MED ORDER — DEXAMETHASONE SODIUM PHOSPHATE 20 MG/5ML IJ SOLN
INTRAMUSCULAR | Status: AC
  Filled 2014-08-14: qty 5

## 2014-08-14 MED ORDER — DEXAMETHASONE SODIUM PHOSPHATE 20 MG/5ML IJ SOLN
12.0000 mg | Freq: Once | INTRAMUSCULAR | Status: AC
  Administered 2014-08-14: 12 mg via INTRAVENOUS

## 2014-08-14 MED ORDER — FAMOTIDINE IN NACL 20-0.9 MG/50ML-% IV SOLN
20.0000 mg | Freq: Once | INTRAVENOUS | Status: AC
  Administered 2014-08-14: 20 mg via INTRAVENOUS

## 2014-08-14 MED ORDER — FAMOTIDINE IN NACL 20-0.9 MG/50ML-% IV SOLN
INTRAVENOUS | Status: AC
  Filled 2014-08-14: qty 50

## 2014-08-14 MED ORDER — DEXTROSE 5 % IV SOLN
26.0000 mg/m2 | Freq: Once | INTRAVENOUS | Status: AC
  Administered 2014-08-14: 60 mg via INTRAVENOUS
  Filled 2014-08-14: qty 6

## 2014-08-14 NOTE — Progress Notes (Signed)
Hematology and Oncology Follow Up Visit  [name] [id number] 08/26/1946 68 y.o. 08/14/2014 1:40 PM   [name]. [name], [name]  [name], [name]  Principle Diagnosis:This is a 68 year old gentleman with prostate cancer initially diagnosed in 2001.  He had a Gleason score of 3 + 3 = 6.  He has castration-resistant disease with pelvic adenopathy    Prior Therapy:  1. Status post prostatectomy done in May 2001.  He had T3 N1 disease.  PSA nadir to 0.  2. The patient developed biochemical relapse and received prostatic bed radiation.  3. The patient received Lupron with Casodex due to rising PSA.  The patient subsequently developed castration-resistant disease. 4. Patient treated with Casodex withdrawal and subsequently PSA rose to 18. 5. Patient with Provenge immunotherapy completed in July 2011. 6.  He received ketoconazole and prednisone December 2011 through January 2014. 7.         He is S/P Zytiga 1000 mg daily with Prednisone 5 mg daily on 11/03/12 till 07/2013. This was stopped due to progression of disease.  8.  Xtandi 1000 mg daily 07/2013 through 01/24/2014. Discontinued secondary to disease progression 9.         Docetaxel 75 mg/m2 with Neulasta support, status post 5 cycles. Discontinued 06/05/2014 secondary to disease progression.  Current therapy: 1. Systemic chemotherapy with Jevtana 25 mg/m2 given every 3 weeks. Cycle 4 day 1.  2. He is on Lupron 30 mg every 4 months. Due in 07/2014.   Interim History: [name] presents today for a followup visit with his wife [name].  He is here to receive his fourth cycle of Cabazitaxel(Jevtana) today.  He tells me that the worst side effect that he has experienced from the chemotherapy is fatigue, he notes that it stays about the same and doesn't get better the further out he gets from treatment.  He does urinate more often after receiving chemotherapy.  He has constipation that he has noticed and he is managing this with  stool softeners.  He is having daily bowel movements.  He has a mild tingling in his fingertips that is constant.  He denies any difficulty with motor changes and denies any numbness or tingling in his feet.  He denies any nail dyscrasia.  He denies fevers, chills, nausea, vomiting, diarrhea, skin changes, or any further concerns.     Medications: I have reviewed the patient's current medications. Current Outpatient Prescriptions  Medication Sig Dispense Refill  . amLODipine (NORVASC) 5 MG tablet Take 5 mg by mouth every evening.     [name] aspirin 81 MG tablet Take 81 mg by mouth daily.      . calcium carbonate (OS-CAL) 1250 MG chewable tablet Chew 2 tablets by mouth daily.    [name] leuprolide (LUPRON) 30 MG injection Inject 30 mg into the muscle every 4 (four) months. LAST INJECTION JUNE 2015    . lidocaine-prilocaine (EMLA) cream Apply topically as needed. 1 g 2  . VESICARE 5 MG tablet     . HYDROcodone-acetaminophen (NORCO) 5-325 MG per tablet Take 1-2 tablets by mouth every 4 (four) hours as needed for moderate pain. 30 tablet 0  . losartan-hydrochlorothiazide (HYZAAR) 100-12.5 MG per tablet Take 1 tablet by mouth every morning.     . ondansetron (ZOFRAN) 8 MG tablet Take 8 mg by mouth every 8 (eight) hours as needed for nausea or vomiting.     No current facility-administered medications for this visit.   Facility-Administered Medications Ordered in  Other Visits  Medication Dose Route Frequency Provider Last Rate Last Dose  . heparin lock flush 100 unit/mL  500 Units Intracatheter Once PRN [name], MD      . sodium chloride 0.9 % injection 10 mL  10 mL Intracatheter PRN [name], MD        Allergies: No Known Allergies    Past Medical History  Diagnosis Date  . Hypertension   . Osteoarthritis   . Retroperitoneal lymphadenopathy   . History of pulmonary embolus (PE)     dec 2009  . History of DVT (deep vein thrombosis)     dec 2009  . OSA on CPAP   . History of acute  renal failure     W/ SEPSIS  MARCH 2014  . Urgency of urination   . ED (erectile dysfunction)   . Left renal mass     MONITORED BY UROLOGIST-  DR Diona Fanti  . Hydronephrosis, right   . Prostate cancer ONCOLOGIST-  DR Alen Blew    DX 2001 (T3 N1)-- S/P PROSTATECTOMY--/ BIOCHEMICAL RELAPSE, RECEIVED PROSTATIC BED RADIATION AND LUPRON WITH CASODEX   SINCE DEVELOPED  CASTATION RESISITENT METASTATIC PROSTATE ADENOCARINOMA--  CURRENT THERAPY CHEMOTHEAPY EVERY 3 WEEKS AND LUPRON INJECTIONS EVERY 4 MONTHS  . Fatigue   . Chemotherapy-induced nausea    Past Surgical History  Procedure Laterality Date  . Total knee arthroplasty Bilateral RIGHT  04-24-2009/   LEFT  2005  . Insertion ivc filter and removal  03-25-2009/   REMOVAL 05-29-2009  . Prostatectomy  MAY 2001  . Transthoracic echocardiogram  12-27-2012    MILD LVH/  EF 17%/  GRADE I DIASTOLIC DYSFUNCTION  . Inguinal hernia repair Right 2010  . Penile prosthesis implant  2005  . Cystoscopy w/ ureteral stent placement Right 10/22/2013    Procedure: CYSTOSCOPY WITH STENT REPLACEMENT;  Surgeon: Franchot Gallo, MD;  Location[facility];  Service: Urology;  Laterality: Right;  . Portacath placement  02-06-2014  . Colonoscopy w/ polypectomy  05-23-2013  . Cystoscopy w/ ureteral stent placement Right 06/24/2014    Procedure: CYSTOSCOPY WITH RETROGRADE PYELOGRAM/ URETERAL STENT REPLACEMENT WITH URETERAL DILATION, RIGHT;  Surgeon: Jorja Loa, MD;  Location: [facility];  Service: Urology;  Laterality: Right;   Family History  Problem Relation Age of Onset  . Colon cancer Neg Hx   . Heart disease Mother    History   Social History  . Marital Status: Married    Spouse Name: N/A    Number of Children: N/A  . Years of Education: N/A   Social History Main Topics  . Smoking status: Never Smoker   . Smokeless tobacco: Never Used  . Alcohol Use: 3.6 oz/week    6 Cans of beer per week  . Drug Use: No  .  Sexual Activity: None   Other Topics Concern  . None   Social History Narrative     Physical Exam: Blood pressure 126/67, pulse 73, temperature 98.4 F (36.9 C), temperature source Oral, resp. rate 18, height 5\' 8"  (1.727 m), weight 234 lb 4.8 oz (106.278 kg).  GENERAL: Patient is a well appearing overweight male in no acute distress HEENT:  Sclerae anicteric.  Oropharynx clear and moist. No ulcerations or evidence of oropharyngeal candidiasis. Neck is supple.  NODES:  No cervical, supraclavicular, or axillary lymphadenopathy palpated.  LUNGS:  Clear to auscultation bilaterally.  No wheezes or rhonchi. HEART:  Regular rate and rhythm. No murmur appreciated. ABDOMEN:  Soft, nontender.  Positive, normoactive bowel sounds. No organomegaly palpated. MSK:  No focal spinal tenderness to palpation. Full range of motion bilaterally in the upper extremities. EXTREMITIES:  No peripheral edema.   SKIN:  Clear with no obvious rashes or skin changes. No nail dyscrasia. NEURO:  Nonfocal. Well oriented.  Appropriate affect. ECOG: 1    Lab Results: Lab Results  Component Value Date   WBC 7.0 08/14/2014   HGB 9.3* 08/14/2014   HCT 29.7* 08/14/2014   MCV 82.5 08/14/2014   PLT 228 08/14/2014     Chemistry      Component Value Date/Time   NA 144 08/14/2014 0849   NA 138 06/24/2014 0729   K 4.2 08/14/2014 0849   K 3.9 06/24/2014 0729   CL 104 02/06/2014 1005   CL 108* 03/09/2013 0923   CO2 23 08/14/2014 0849   CO2 26 02/06/2014 1005   BUN 16.1 08/14/2014 0849   BUN 12 02/06/2014 1005   CREATININE 0.9 08/14/2014 0849   CREATININE 0.89 02/06/2014 1005      Component Value Date/Time   CALCIUM 8.8 08/14/2014 0849   CALCIUM 9.4 02/06/2014 1005   ALKPHOS 61 08/14/2014 0849   ALKPHOS 71 12/27/2012 0415   AST 12 08/14/2014 0849   AST 17 12/27/2012 0415   ALT 8 08/14/2014 0849   ALT 24 12/27/2012 0415   BILITOT 0.30 08/14/2014 0849   BILITOT 0.3 12/27/2012 0415      Results for  OTT, ZIMMERLE (MRN [id number]) as of 07/24/2014 10:14  Ref. Range 07/03/2014 11:06 07/23/2014 09:18  PSA Latest Range: <=4.00 ng/mL 1136.00 (H) 811.60 (H)    Impression and Plan:  This is a 68 year old gentleman with the following issues: 1. Castration-resistant prostate cancer with pelvic adenopathy. He is now on systemic chemotherapy in the form  Jevtana  with Neulasta support. His PSA also dropped to 811 after 2 cycles of chemotherapy. He will proceed with cycle 4 of treatment today.  He does prefer for lab draws to be done the day prior to treatment.   2. IV access: Status post right anterior chest Port-A-Cath placement on 02/06/2014. Continue EMLA cream as needed. No complications at this time. 3. Hydronephrosis: He is S/P stent placement. 4. Sepsis and thrombocytopenia: Resolved. 5. Hormonal deprivation.  He continues to be on Lupron. Last given on 07/10/2014. 6. 3.8 cm enhancing mass in the upper pole of the left kidney likely renal cell carcinoma. We will continue  observation and will followup on at with his next CT scan. 7. Lower extremity edema-patient advised to decrease sodium intake. We will monitor closely on subsequent visits. 8. Peripheral neuropathy.  This is mild and is not affecting any of his motor functions.  I reviewed this with Dr. Alen Blew and the patient will proceed with treatment and he will f/u with him about this on 09/04/14 at his next appointment.   I reviewed the above plan with Mr. Tiffany Kocher in detail and he is in agreement with it. Mr. Tiffany Kocher will return on 09/04/14 for labs, evaluation by Dr. Alen Blew and his fifth cycle of Jevtana.   He knows to call us in the interim for any questions or concerns.  We can certainly see him sooner if needed.  I spent 25 minutes counseling the patient face to face.  The total time spent in the appointment was 30 minutes.   Minette Headland, [place] [phone] 11/4/20151:40 PM

## 2014-08-14 NOTE — Patient Instructions (Signed)

## 2014-08-14 NOTE — Patient Instructions (Signed)
[facility] Discharge Instructions for Patients Receiving Chemotherapy  Today you received the following chemotherapy agents: Jevtana.  To help prevent nausea and vomiting after your treatment, we encourage you to take your nausea medication: Zofran 8mg  every 8 hours.   If you develop nausea and vomiting that is not controlled by your nausea medication, call the clinic.   BELOW ARE SYMPTOMS THAT SHOULD BE REPORTED IMMEDIATELY:  *FEVER GREATER THAN 100.5 F  *CHILLS WITH OR WITHOUT FEVER  NAUSEA AND VOMITING THAT IS NOT CONTROLLED WITH YOUR NAUSEA MEDICATION  *UNUSUAL SHORTNESS OF BREATH  *UNUSUAL BRUISING OR BLEEDING  TENDERNESS IN MOUTH AND THROAT WITH OR WITHOUT PRESENCE OF ULCERS  *URINARY PROBLEMS  *BOWEL PROBLEMS  UNUSUAL RASH Items with * indicate a potential emergency and should be followed up as soon as possible.  Feel free to call the clinic you have any questions or concerns. The clinic phone number is [phone].

## 2014-08-15 ENCOUNTER — Ambulatory Visit (HOSPITAL_BASED_OUTPATIENT_CLINIC_OR_DEPARTMENT_OTHER): Payer: Medicare Other

## 2014-08-15 DIAGNOSIS — Z5189 Encounter for other specified aftercare: Secondary | ICD-10-CM

## 2014-08-15 DIAGNOSIS — C61 Malignant neoplasm of prostate: Secondary | ICD-10-CM

## 2014-08-15 LAB — PSA: PSA: 713.5 ng/mL — ABNORMAL HIGH (ref <=4.00)

## 2014-08-15 MED ORDER — PEGFILGRASTIM INJECTION 6 MG/0.6ML ~~LOC~~
6.0000 mg | PREFILLED_SYRINGE | Freq: Once | SUBCUTANEOUS | Status: AC
  Administered 2014-08-15: 6 mg via SUBCUTANEOUS
  Filled 2014-08-15: qty 0.6

## 2014-08-15 NOTE — Patient Instructions (Signed)
Pegfilgrastim injection What is this medicine? PEGFILGRASTIM (peg fil GRA stim) is a long-acting granulocyte colony-stimulating factor that stimulates the growth of neutrophils, a type of white blood cell important in the body's fight against infection. It is used to reduce the incidence of fever and infection in patients with certain types of cancer who are receiving chemotherapy that affects the bone marrow. This medicine may be used for other purposes; ask your health care provider or pharmacist if you have questions. COMMON BRAND NAME(S): Neulasta What should I tell my health care provider before I take this medicine? They need to know if you have any of these conditions: -latex allergy -ongoing radiation therapy -sickle cell disease -skin reactions to acrylic adhesives (On-Body Injector only) -an unusual or allergic reaction to pegfilgrastim, filgrastim, other medicines, foods, dyes, or preservatives -pregnant or trying to get pregnant -breast-feeding How should I use this medicine? This medicine is for injection under the skin. If you get this medicine at home, you will be taught how to prepare and give the pre-filled syringe or how to use the On-body Injector. Refer to the patient Instructions for Use for detailed instructions. Use exactly as directed. Take your medicine at regular intervals. Do not take your medicine more often than directed. It is important that you put your used needles and syringes in a special sharps container. Do not put them in a trash can. If you do not have a sharps container, call your pharmacist or healthcare provider to get one. Talk to your pediatrician regarding the use of this medicine in children. Special care may be needed. Overdosage: If you think you have taken too much of this medicine contact a poison control center or emergency room at once. NOTE: This medicine is only for you. Do not share this medicine with others. What if I miss a dose? It is  important not to miss your dose. Call your doctor or health care professional if you miss your dose. If you miss a dose due to an On-body Injector failure or leakage, a new dose should be administered as soon as possible using a single prefilled syringe for manual use. What may interact with this medicine? Interactions have not been studied. Give your health care provider a list of all the medicines, herbs, non-prescription drugs, or dietary supplements you use. Also tell them if you smoke, drink alcohol, or use illegal drugs. Some items may interact with your medicine. This list may not describe all possible interactions. Give your health care provider a list of all the medicines, herbs, non-prescription drugs, or dietary supplements you use. Also tell them if you smoke, drink alcohol, or use illegal drugs. Some items may interact with your medicine. What should I watch for while using this medicine? You may need blood work done while you are taking this medicine. If you are going to need a MRI, CT scan, or other procedure, tell your doctor that you are using this medicine (On-Body Injector only). What side effects may I notice from receiving this medicine? Side effects that you should report to your doctor or health care professional as soon as possible: -allergic reactions like skin rash, itching or hives, swelling of the face, lips, or tongue -dizziness -fever -pain, redness, or irritation at site where injected -pinpoint red spots on the skin -shortness of breath or breathing problems -stomach or side pain, or pain at the shoulder -swelling -tiredness -trouble passing urine Side effects that usually do not require medical attention (report to your doctor   or health care professional if they continue or are bothersome): -bone pain -muscle pain This list may not describe all possible side effects. Call your doctor for medical advice about side effects. You may report side effects to FDA at  1-800-FDA-1088. Where should I keep my medicine? Keep out of the reach of children. Store pre-filled syringes in a refrigerator between 2 and 8 degrees C (36 and 46 degrees F). Do not freeze. Keep in carton to protect from light. Throw away this medicine if it is left out of the refrigerator for more than 48 hours. Throw away any unused medicine after the expiration date. NOTE: This sheet is a summary. It may not cover all possible information. If you have questions about this medicine, talk to your doctor, pharmacist, or health care provider.  2015, [employer]. (2013-12-27 16:14:05)  

## 2014-09-03 ENCOUNTER — Other Ambulatory Visit (HOSPITAL_BASED_OUTPATIENT_CLINIC_OR_DEPARTMENT_OTHER): Payer: Medicare Other

## 2014-09-03 DIAGNOSIS — C61 Malignant neoplasm of prostate: Secondary | ICD-10-CM

## 2014-09-03 LAB — CBC WITH DIFFERENTIAL/PLATELET
BASO%: 0.6 % (ref 0.0–2.0)
Basophils Absolute: 0 10*3/uL (ref 0.0–0.1)
EOS%: 2.5 % (ref 0.0–7.0)
Eosinophils Absolute: 0.2 10*3/uL (ref 0.0–0.5)
HCT: 31.5 % — ABNORMAL LOW (ref 38.4–49.9)
HGB: 9.7 g/dL — ABNORMAL LOW (ref 13.0–17.1)
LYMPH#: 1.8 10*3/uL (ref 0.9–3.3)
LYMPH%: 25.1 % (ref 14.0–49.0)
MCH: 25.7 pg — ABNORMAL LOW (ref 27.2–33.4)
MCHC: 30.8 g/dL — ABNORMAL LOW (ref 32.0–36.0)
MCV: 83.6 fL (ref 79.3–98.0)
MONO#: 0.5 10*3/uL (ref 0.1–0.9)
MONO%: 6.8 % (ref 0.0–14.0)
NEUT#: 4.7 10*3/uL (ref 1.5–6.5)
NEUT%: 65 % (ref 39.0–75.0)
Platelets: 236 10*3/uL (ref 140–400)
RBC: 3.77 10*6/uL — AB (ref 4.20–5.82)
RDW: 21 % — ABNORMAL HIGH (ref 11.0–14.6)
WBC: 7.2 10*3/uL (ref 4.0–10.3)

## 2014-09-03 LAB — COMPREHENSIVE METABOLIC PANEL (CC13)
ALBUMIN: 3.7 g/dL (ref 3.5–5.0)
ALK PHOS: 67 U/L (ref 40–150)
ALT: 9 U/L (ref 0–55)
AST: 12 U/L (ref 5–34)
Anion Gap: 8 mEq/L (ref 3–11)
BUN: 13.4 mg/dL (ref 7.0–26.0)
CALCIUM: 9.4 mg/dL (ref 8.4–10.4)
CHLORIDE: 110 meq/L — AB (ref 98–109)
CO2: 26 mEq/L (ref 22–29)
Creatinine: 0.9 mg/dL (ref 0.7–1.3)
Glucose: 111 mg/dl (ref 70–140)
POTASSIUM: 4.5 meq/L (ref 3.5–5.1)
SODIUM: 144 meq/L (ref 136–145)
TOTAL PROTEIN: 6.9 g/dL (ref 6.4–8.3)
Total Bilirubin: 0.35 mg/dL (ref 0.20–1.20)

## 2014-09-04 ENCOUNTER — Telehealth: Payer: Self-pay | Admitting: Oncology

## 2014-09-04 ENCOUNTER — Ambulatory Visit (HOSPITAL_BASED_OUTPATIENT_CLINIC_OR_DEPARTMENT_OTHER): Payer: PRIVATE HEALTH INSURANCE | Admitting: Oncology

## 2014-09-04 ENCOUNTER — Ambulatory Visit (HOSPITAL_BASED_OUTPATIENT_CLINIC_OR_DEPARTMENT_OTHER): Payer: Medicare Other

## 2014-09-04 VITALS — BP 139/68 | HR 72 | Temp 98.3°F | Resp 18 | Ht 68.0 in | Wt 233.4 lb

## 2014-09-04 DIAGNOSIS — C61 Malignant neoplasm of prostate: Secondary | ICD-10-CM

## 2014-09-04 DIAGNOSIS — E291 Testicular hypofunction: Secondary | ICD-10-CM

## 2014-09-04 DIAGNOSIS — R609 Edema, unspecified: Secondary | ICD-10-CM

## 2014-09-04 DIAGNOSIS — N1330 Unspecified hydronephrosis: Secondary | ICD-10-CM

## 2014-09-04 DIAGNOSIS — Z5111 Encounter for antineoplastic chemotherapy: Secondary | ICD-10-CM

## 2014-09-04 DIAGNOSIS — N289 Disorder of kidney and ureter, unspecified: Secondary | ICD-10-CM

## 2014-09-04 LAB — PSA: PSA: 739.3 ng/mL — ABNORMAL HIGH (ref <=4.00)

## 2014-09-04 MED ORDER — SODIUM CHLORIDE 0.9 % IV SOLN
Freq: Once | INTRAVENOUS | Status: AC
  Administered 2014-09-04: 10:00:00 via INTRAVENOUS

## 2014-09-04 MED ORDER — FAMOTIDINE IN NACL 20-0.9 MG/50ML-% IV SOLN
20.0000 mg | Freq: Once | INTRAVENOUS | Status: AC
  Administered 2014-09-04: 20 mg via INTRAVENOUS

## 2014-09-04 MED ORDER — DIPHENHYDRAMINE HCL 50 MG/ML IJ SOLN
INTRAMUSCULAR | Status: AC
  Filled 2014-09-04: qty 1

## 2014-09-04 MED ORDER — HEPARIN SOD (PORK) LOCK FLUSH 100 UNIT/ML IV SOLN
500.0000 [IU] | Freq: Once | INTRAVENOUS | Status: AC | PRN
  Administered 2014-09-04: 500 [IU]
  Filled 2014-09-04: qty 5

## 2014-09-04 MED ORDER — CABAZITAXEL CHEMO INJECTION 60 MG/6ML W/DILUENT
26.0000 mg/m2 | Freq: Once | INTRAVENOUS | Status: AC
  Administered 2014-09-04: 60 mg via INTRAVENOUS
  Filled 2014-09-04: qty 6

## 2014-09-04 MED ORDER — DEXAMETHASONE SODIUM PHOSPHATE 20 MG/5ML IJ SOLN
INTRAMUSCULAR | Status: AC
  Filled 2014-09-04: qty 5

## 2014-09-04 MED ORDER — ONDANSETRON 8 MG/NS 50 ML IVPB
INTRAVENOUS | Status: AC
Start: 2014-09-04 — End: 2014-09-04
  Filled 2014-09-04: qty 8

## 2014-09-04 MED ORDER — SODIUM CHLORIDE 0.9 % IJ SOLN
10.0000 mL | INTRAMUSCULAR | Status: DC | PRN
  Administered 2014-09-04: 10 mL
  Filled 2014-09-04: qty 10

## 2014-09-04 MED ORDER — ONDANSETRON 8 MG/50ML IVPB (CHCC)
8.0000 mg | Freq: Once | INTRAVENOUS | Status: AC
Start: 2014-09-04 — End: 2014-09-04
  Administered 2014-09-04: 8 mg via INTRAVENOUS

## 2014-09-04 MED ORDER — FAMOTIDINE IN NACL 20-0.9 MG/50ML-% IV SOLN
INTRAVENOUS | Status: AC
  Filled 2014-09-04: qty 50

## 2014-09-04 MED ORDER — DEXAMETHASONE SODIUM PHOSPHATE 20 MG/5ML IJ SOLN
12.0000 mg | Freq: Once | INTRAMUSCULAR | Status: AC
  Administered 2014-09-04: 12 mg via INTRAVENOUS

## 2014-09-04 MED ORDER — DIPHENHYDRAMINE HCL 50 MG/ML IJ SOLN
25.0000 mg | Freq: Once | INTRAMUSCULAR | Status: AC
  Administered 2014-09-04: 25 mg via INTRAVENOUS

## 2014-09-04 NOTE — Telephone Encounter (Signed)
gv and printed appt sched and avs for pt for NOV thru Jan 2016....sed added tx. °

## 2014-09-04 NOTE — Progress Notes (Signed)
Hematology and Oncology Follow Up Visit  [name] [id number] [birth year] 68 y.o. 09/04/2014 9:22 AM   [name], [name]  [name]. [name], [name]  Principle Diagnosis:This is a 68 year old gentleman with prostate cancer initially diagnosed in 2001.  He had a Gleason score of 3 + 3 = 6.  He has castration-resistant disease with pelvic adenopathy   Prior Therapy:  1. Status post prostatectomy done in May 2001.  He had T3 N1 disease.  PSA nadir to 0.  2. The patient developed biochemical relapse and received prostatic bed radiation.  3. The patient received Lupron with Casodex due to rising PSA.  The patient subsequently developed castration-resistant disease. 4. Patient treated with Casodex withdrawal and subsequently PSA rose to 18. 5. Patient with Provenge immunotherapy completed in July 2011. 6.  He received ketoconazole and prednisone December 2011 through January 2014. 7.         He is S/P Zytiga 1000 mg daily with Prednisone 5 mg daily on 11/03/12 till 07/2013. This was stopped due to progression of disease.  8.  Xtandi 1000 mg daily 07/2013 through 01/24/2014. Discontinued secondary to disease progression 9.         Docetaxel 75 mg/m2 with Neulasta support, status post 5 cycles. Discontinued 06/05/2014 secondary to disease progression.  Current therapy: 1. Systemic chemotherapy with Jevtana 25 mg/m2 given every 3 weeks. Status post 4 cycles. 2. He is on Lupron 30 mg every 4 months. Last time given on 07/10/2014.  Interim History: [name] presents today for a followup visit with his wife. He tolerated Jevtana so far without difficulty. He has noted some lower extremity edema which has not changed. He reported no new complaints. He is not reporting any nausea.  He is not reporting any vomiting. He denies any neuropathy or infusion related complications.  Denies any GI toxicities at this time.  Overall, his performance status and activity levels continue to be close to  base line.  He continues to drive and perform activities of daily living without any hindrance or decline.  Had not reported any genitourinary complaints or bleeding.  He has not reported any neurological complaints or decline in his quality of life. Denied seizures or decline in his exercise tolerance. He has not reported any lymphadenopathy or pruritus. He has not reported any petechiae or bleeding.Remainder of his review of systems unremarkable.    Medications: I have reviewed the patient's current medications. Current Outpatient Prescriptions  Medication Sig Dispense Refill  . amLODipine (NORVASC) 10 MG tablet     . amLODipine (NORVASC) 5 MG tablet Take 5 mg by mouth every evening.     [name] aspirin 81 MG tablet Take 81 mg by mouth daily.      . calcium carbonate (OS-CAL) 1250 MG chewable tablet Chew 2 tablets by mouth daily.    [name] HYDROcodone-acetaminophen (NORCO) 5-325 MG per tablet Take 1-2 tablets by mouth every 4 (four) hours as needed for moderate pain. 30 tablet 0  . leuprolide (LUPRON) 30 MG injection Inject 30 mg into the muscle every 4 (four) months. LAST INJECTION JUNE 2015    . lidocaine-prilocaine (EMLA) cream Apply topically as needed. 1 g 2  . losartan-hydrochlorothiazide (HYZAAR) 100-12.5 MG per tablet Take 1 tablet by mouth every morning.     . ondansetron (ZOFRAN) 8 MG tablet Take 8 mg by mouth every 8 (eight) hours as needed for nausea or vomiting.    . sulfamethoxazole-trimethoprim (BACTRIM DS) 800-160 MG per tablet  0  . VESICARE 5 MG tablet     . ZOSTAVAX 25638 UNT/0.65ML injection      No current facility-administered medications for this visit.    Allergies: No Known Allergies  Past Medical History, Surgical history, Social history, and Family History were reviewed and updated.    Physical Exam: Blood pressure 139/68, pulse 72, temperature 98.3 F (36.8 C), temperature source Oral, resp. rate 18, height 5\' 8"  (1.727 m), weight 233 lb 6.4 oz (105.87 kg). ECOG:  1 General appearance: alert awake appeared in no active distress. Head: Normocephalic, without obvious abnormality, atraumatic Neck: no adenopathy Lymph nodes: Cervical, supraclavicular, and axillary nodes normal. Heart:regular rate and rhythm, S1, S2 normal, no murmur, click, rub or gallop Lung:chest clear, no wheezing, rales, normal symmetric air entry. Chest wall examination: Right anterior chest porta cath, well healed, non-tender, no evidence of infection  Abdomen: soft, non-tender, without masses or organomegaly EXT:1+ edema which is unchanged.   Lab Results: Lab Results  Component Value Date   WBC 7.2 09/03/2014   HGB 9.7* 09/03/2014   HCT 31.5* 09/03/2014   MCV 83.6 09/03/2014   PLT 236 09/03/2014     Chemistry      Component Value Date/Time   NA 144 09/03/2014 0913   NA 138 06/24/2014 0729   K 4.5 09/03/2014 0913   K 3.9 06/24/2014 0729   CL 104 02/06/2014 1005   CL 108* 03/09/2013 0923   CO2 26 09/03/2014 0913   CO2 26 02/06/2014 1005   BUN 13.4 09/03/2014 0913   BUN 12 02/06/2014 1005   CREATININE 0.9 09/03/2014 0913   CREATININE 0.89 02/06/2014 1005      Component Value Date/Time   CALCIUM 9.4 09/03/2014 0913   CALCIUM 9.4 02/06/2014 1005   ALKPHOS 67 09/03/2014 0913   ALKPHOS 71 12/27/2012 0415   AST 12 09/03/2014 0913   AST 17 12/27/2012 0415   ALT 9 09/03/2014 0913   ALT 24 12/27/2012 0415   BILITOT 0.35 09/03/2014 0913   BILITOT 0.3 12/27/2012 0415      Results for [name], [name] (MRN [id number]) as of 09/04/2014 09:12  Ref. Range 07/23/2014 09:18 08/14/2014 08:48 09/03/2014 09:13  PSA Latest Range: <=4.00 ng/mL 811.60 (H) 713.50 (H) 739.30 (H)     Impression and Plan:  This is a 68 year old gentleman with the following issues: 1. Castration-resistant prostate cancer with pelvic adenopathy. He is now on systemic chemotherapy in the form Jevtana  with Neulasta support. He has tolerated chemotherapy well at this time with reasonable  response in his PSA. The plan is to continue the current dose and schedule and he will receive chemotherapy today and another cycle in 3 weeks. 2. IV access: Status post right anterior chest Port-A-Cath placement on 02/06/2014. Continue EMLA cream as needed. No complications at this time. 3. Hydronephrosis: He is S/P stent placement. 4. Hormonal deprivation.  He continues to be on Lupron. Last given September 2015 and will be repeated in February 2016. 5. 3.8 cm enhancing mass in the upper pole of the left kidney likely renal cell carcinoma. We will continue  observation and will followup on at with his next CT scan. 6. Lower extremity edema-patient advised to decrease sodium intake. We will monitor closely on subsequent visits. 7. Followup will be in in 3 week for the start of cycle 6 Jevtana chemotherapy.   [facility], MD 11/25/20159:22 AM

## 2014-09-06 ENCOUNTER — Ambulatory Visit (HOSPITAL_BASED_OUTPATIENT_CLINIC_OR_DEPARTMENT_OTHER): Payer: Medicare Other

## 2014-09-06 DIAGNOSIS — C61 Malignant neoplasm of prostate: Secondary | ICD-10-CM

## 2014-09-06 DIAGNOSIS — Z5189 Encounter for other specified aftercare: Secondary | ICD-10-CM

## 2014-09-06 MED ORDER — PEGFILGRASTIM INJECTION 6 MG/0.6ML ~~LOC~~
6.0000 mg | PREFILLED_SYRINGE | Freq: Once | SUBCUTANEOUS | Status: AC
Start: 2014-09-06 — End: 2014-09-06
  Administered 2014-09-06: 6 mg via SUBCUTANEOUS
  Filled 2014-09-06: qty 0.6

## 2014-09-06 NOTE — Patient Instructions (Signed)
Pegfilgrastim injection What is this medicine? PEGFILGRASTIM (peg fil GRA stim) is a long-acting granulocyte colony-stimulating factor that stimulates the growth of neutrophils, a type of white blood cell important in the body's fight against infection. It is used to reduce the incidence of fever and infection in patients with certain types of cancer who are receiving chemotherapy that affects the bone marrow. This medicine may be used for other purposes; ask your health care provider or pharmacist if you have questions. COMMON BRAND NAME(S): Neulasta What should I tell my health care provider before I take this medicine? They need to know if you have any of these conditions: -latex allergy -ongoing radiation therapy -sickle cell disease -skin reactions to acrylic adhesives (On-Body Injector only) -an unusual or allergic reaction to pegfilgrastim, filgrastim, other medicines, foods, dyes, or preservatives -pregnant or trying to get pregnant -breast-feeding How should I use this medicine? This medicine is for injection under the skin. If you get this medicine at home, you will be taught how to prepare and give the pre-filled syringe or how to use the On-body Injector. Refer to the patient Instructions for Use for detailed instructions. Use exactly as directed. Take your medicine at regular intervals. Do not take your medicine more often than directed. It is important that you put your used needles and syringes in a special sharps container. Do not put them in a trash can. If you do not have a sharps container, call your pharmacist or healthcare provider to get one. Talk to your pediatrician regarding the use of this medicine in children. Special care may be needed. Overdosage: If you think you have taken too much of this medicine contact a poison control center or emergency room at once. NOTE: This medicine is only for you. Do not share this medicine with others. What if I miss a dose? It is  important not to miss your dose. Call your doctor or health care professional if you miss your dose. If you miss a dose due to an On-body Injector failure or leakage, a new dose should be administered as soon as possible using a single prefilled syringe for manual use. What may interact with this medicine? Interactions have not been studied. Give your health care provider a list of all the medicines, herbs, non-prescription drugs, or dietary supplements you use. Also tell them if you smoke, drink alcohol, or use illegal drugs. Some items may interact with your medicine. This list may not describe all possible interactions. Give your health care provider a list of all the medicines, herbs, non-prescription drugs, or dietary supplements you use. Also tell them if you smoke, drink alcohol, or use illegal drugs. Some items may interact with your medicine. What should I watch for while using this medicine? You may need blood work done while you are taking this medicine. If you are going to need a MRI, CT scan, or other procedure, tell your doctor that you are using this medicine (On-Body Injector only). What side effects may I notice from receiving this medicine? Side effects that you should report to your doctor or health care professional as soon as possible: -allergic reactions like skin rash, itching or hives, swelling of the face, lips, or tongue -dizziness -fever -pain, redness, or irritation at site where injected -pinpoint red spots on the skin -shortness of breath or breathing problems -stomach or side pain, or pain at the shoulder -swelling -tiredness -trouble passing urine Side effects that usually do not require medical attention (report to your doctor   or health care professional if they continue or are bothersome): -bone pain -muscle pain This list may not describe all possible side effects. Call your doctor for medical advice about side effects. You may report side effects to FDA at  1-800-FDA-1088. Where should I keep my medicine? Keep out of the reach of children. Store pre-filled syringes in a refrigerator between 2 and 8 degrees C (36 and 46 degrees F). Do not freeze. Keep in carton to protect from light. Throw away this medicine if it is left out of the refrigerator for more than 48 hours. Throw away any unused medicine after the expiration date. NOTE: This sheet is a summary. It may not cover all possible information. If you have questions about this medicine, talk to your doctor, pharmacist, or health care provider.  2015, [employer]. (2013-12-27 16:14:05)  

## 2014-09-25 ENCOUNTER — Other Ambulatory Visit (HOSPITAL_BASED_OUTPATIENT_CLINIC_OR_DEPARTMENT_OTHER): Payer: Medicare Other

## 2014-09-25 ENCOUNTER — Other Ambulatory Visit: Payer: Self-pay | Admitting: Nurse Practitioner

## 2014-09-25 DIAGNOSIS — C61 Malignant neoplasm of prostate: Secondary | ICD-10-CM

## 2014-09-25 LAB — COMPREHENSIVE METABOLIC PANEL (CC13)
ALT: 11 U/L (ref 0–55)
ANION GAP: 9 meq/L (ref 3–11)
AST: 12 U/L (ref 5–34)
Albumin: 3.5 g/dL (ref 3.5–5.0)
Alkaline Phosphatase: 64 U/L (ref 40–150)
BUN: 11.9 mg/dL (ref 7.0–26.0)
CALCIUM: 8.6 mg/dL (ref 8.4–10.4)
CHLORIDE: 110 meq/L — AB (ref 98–109)
CO2: 25 meq/L (ref 22–29)
Creatinine: 0.9 mg/dL (ref 0.7–1.3)
EGFR: >90 mL/min/{1.73_m2} (ref >90)
GLUCOSE: 104 mg/dL (ref 70–140)
Potassium: 4.5 mEq/L (ref 3.5–5.1)
Sodium: 144 mEq/L (ref 136–145)
Total Bilirubin: 0.3 mg/dL (ref 0.20–1.20)
Total Protein: 6.4 g/dL (ref 6.4–8.3)

## 2014-09-25 LAB — CBC WITH DIFFERENTIAL/PLATELET
BASO%: 0.6 % (ref 0.0–2.0)
BASOS ABS: 0 10*3/uL (ref 0.0–0.1)
EOS ABS: 0.1 10*3/uL (ref 0.0–0.5)
EOS%: 1.4 % (ref 0.0–7.0)
HEMATOCRIT: 31.6 % — AB (ref 38.4–49.9)
HEMOGLOBIN: 9.7 g/dL — AB (ref 13.0–17.1)
LYMPH#: 1.4 10*3/uL (ref 0.9–3.3)
LYMPH%: 22.7 % (ref 14.0–49.0)
MCH: 26.1 pg — ABNORMAL LOW (ref 27.2–33.4)
MCHC: 30.7 g/dL — ABNORMAL LOW (ref 32.0–36.0)
MCV: 84.9 fL (ref 79.3–98.0)
MONO#: 0.5 10*3/uL (ref 0.1–0.9)
MONO%: 7.6 % (ref 0.0–14.0)
NEUT#: 4.2 10*3/uL (ref 1.5–6.5)
NEUT%: 67.7 % (ref 39.0–75.0)
Platelets: 217 10*3/uL (ref 140–400)
RBC: 3.72 10*6/uL — ABNORMAL LOW (ref 4.20–5.82)
RDW: 21.1 % — ABNORMAL HIGH (ref 11.0–14.6)
WBC: 6.2 10*3/uL (ref 4.0–10.3)

## 2014-09-26 ENCOUNTER — Ambulatory Visit (HOSPITAL_BASED_OUTPATIENT_CLINIC_OR_DEPARTMENT_OTHER): Payer: Medicare Other

## 2014-09-26 ENCOUNTER — Encounter: Payer: Self-pay | Admitting: Physician Assistant

## 2014-09-26 ENCOUNTER — Ambulatory Visit (HOSPITAL_BASED_OUTPATIENT_CLINIC_OR_DEPARTMENT_OTHER): Payer: Medicare Other | Admitting: Physician Assistant

## 2014-09-26 ENCOUNTER — Other Ambulatory Visit: Payer: Self-pay | Admitting: Physician Assistant

## 2014-09-26 VITALS — BP 136/69 | HR 74 | Temp 98.2°F | Resp 18 | Ht 68.0 in | Wt 238.5 lb

## 2014-09-26 DIAGNOSIS — Z5111 Encounter for antineoplastic chemotherapy: Secondary | ICD-10-CM

## 2014-09-26 DIAGNOSIS — C61 Malignant neoplasm of prostate: Secondary | ICD-10-CM

## 2014-09-26 DIAGNOSIS — N289 Disorder of kidney and ureter, unspecified: Secondary | ICD-10-CM

## 2014-09-26 LAB — PSA: PSA: 655.8 ng/mL — ABNORMAL HIGH

## 2014-09-26 MED ORDER — DEXAMETHASONE SODIUM PHOSPHATE 20 MG/5ML IJ SOLN
INTRAMUSCULAR | Status: AC
  Filled 2014-09-26: qty 5

## 2014-09-26 MED ORDER — HEPARIN SOD (PORK) LOCK FLUSH 100 UNIT/ML IV SOLN
500.0000 [IU] | Freq: Once | INTRAVENOUS | Status: AC | PRN
  Administered 2014-09-26: 500 [IU]
  Filled 2014-09-26: qty 5

## 2014-09-26 MED ORDER — FAMOTIDINE IN NACL 20-0.9 MG/50ML-% IV SOLN
INTRAVENOUS | Status: AC
  Filled 2014-09-26: qty 50

## 2014-09-26 MED ORDER — ONDANSETRON 8 MG/NS 50 ML IVPB
INTRAVENOUS | Status: AC
  Filled 2014-09-26: qty 8

## 2014-09-26 MED ORDER — ONDANSETRON 8 MG/50ML IVPB (CHCC)
8.0000 mg | Freq: Once | INTRAVENOUS | Status: AC
  Administered 2014-09-26: 8 mg via INTRAVENOUS

## 2014-09-26 MED ORDER — FAMOTIDINE IN NACL 20-0.9 MG/50ML-% IV SOLN
20.0000 mg | Freq: Once | INTRAVENOUS | Status: AC
  Administered 2014-09-26: 20 mg via INTRAVENOUS

## 2014-09-26 MED ORDER — DEXAMETHASONE SODIUM PHOSPHATE 20 MG/5ML IJ SOLN
12.0000 mg | Freq: Once | INTRAMUSCULAR | Status: AC
  Administered 2014-09-26: 12 mg via INTRAVENOUS

## 2014-09-26 MED ORDER — SODIUM CHLORIDE 0.9 % IV SOLN
Freq: Once | INTRAVENOUS | Status: AC
  Administered 2014-09-26: 10:00:00 via INTRAVENOUS

## 2014-09-26 MED ORDER — DEXTROSE 5 % IV SOLN
26.0000 mg/m2 | Freq: Once | INTRAVENOUS | Status: AC
  Administered 2014-09-26: 60 mg via INTRAVENOUS
  Filled 2014-09-26: qty 6

## 2014-09-26 MED ORDER — DIPHENHYDRAMINE HCL 50 MG/ML IJ SOLN
25.0000 mg | Freq: Once | INTRAMUSCULAR | Status: AC
  Administered 2014-09-26: 25 mg via INTRAVENOUS

## 2014-09-26 MED ORDER — SODIUM CHLORIDE 0.9 % IJ SOLN
10.0000 mL | INTRAMUSCULAR | Status: DC | PRN
  Administered 2014-09-26: 10 mL
  Filled 2014-09-26: qty 10

## 2014-09-26 MED ORDER — DIPHENHYDRAMINE HCL 50 MG/ML IJ SOLN
INTRAMUSCULAR | Status: AC
  Filled 2014-09-26: qty 1

## 2014-09-26 NOTE — Patient Instructions (Signed)
[facility] Discharge Instructions for Patients Receiving Chemotherapy  Today you received the following chemotherapy agents: Jevtana.  To help prevent nausea and vomiting after your treatment, we encourage you to take your nausea medication: Zofran. Take one every 8 hours as needed.   If you develop nausea and vomiting that is not controlled by your nausea medication, call the clinic.   BELOW ARE SYMPTOMS THAT SHOULD BE REPORTED IMMEDIATELY:  *FEVER GREATER THAN 100.5 F  *CHILLS WITH OR WITHOUT FEVER  NAUSEA AND VOMITING THAT IS NOT CONTROLLED WITH YOUR NAUSEA MEDICATION  *UNUSUAL SHORTNESS OF BREATH  *UNUSUAL BRUISING OR BLEEDING  TENDERNESS IN MOUTH AND THROAT WITH OR WITHOUT PRESENCE OF ULCERS  *URINARY PROBLEMS  *BOWEL PROBLEMS  UNUSUAL RASH Items with * indicate a potential emergency and should be followed up as soon as possible.  Feel free to call the clinic should you have any questions or concerns. The clinic phone number is [phone].

## 2014-09-26 NOTE — Patient Instructions (Signed)
Continue labs and chemotherapy as scheduled Keep your appointment for your Neulasta injection as scheduled Follow up in 3 weeks

## 2014-09-26 NOTE — Progress Notes (Signed)
Hematology and Oncology Follow Up Visit  [name] [id number] 1946/03/07 68 y.o. 09/26/2014 2:45 PM   [name]. [name], [name]  [name]. [name], [name]  Principle Diagnosis:This is a 68 year old gentleman with prostate cancer initially diagnosed in 2001.  He had a Gleason score of 3 + 3 = 6.  He has castration-resistant disease with pelvic adenopathy   Prior Therapy:  1. Status post prostatectomy done in May 2001.  He had T3 N1 disease.  PSA nadir to 0.  2. The patient developed biochemical relapse and received prostatic bed radiation.  3. The patient received Lupron with Casodex due to rising PSA.  The patient subsequently developed castration-resistant disease. 4. Patient treated with Casodex withdrawal and subsequently PSA rose to 18. 5. Patient with Provenge immunotherapy completed in July 2011. 6.  He received ketoconazole and prednisone December 2011 through January 2014. 7.         He is S/P Zytiga 1000 mg daily with Prednisone 5 mg daily on 11/03/12 till 07/2013. This was stopped due to progression of disease.  8.  Xtandi 1000 mg daily 07/2013 through 01/24/2014. Discontinued secondary to disease progression 9.         Docetaxel 75 mg/m2 with Neulasta support, status post 5 cycles. Discontinued 06/05/2014 secondary to disease progression.  Current therapy: 1. Systemic chemotherapy with Jevtana 25 mg/m2 given every 3 weeks. Status post 5 cycles. 2. He is on Lupron 30 mg every 4 months. Last time given on 07/10/2014.  Interim History: [name] presents today for a followup visit with his wife. He continues to tolerate Jevtana so far without difficulty.  He reported no new complaints. He is not reporting any nausea.  He is not reporting any vomiting. He denies any neuropathy or infusion related complications.  Denies any GI toxicities at this time. He continues to have some lower extremity edema which has not changed.  Overall, his performance status and activity levels  continue to be close to base line.  He continues to drive and perform activities of daily living without any hindrance or decline.  Had not reported any genitourinary complaints or bleeding.  He has not reported any neurological complaints or decline in his quality of life. Denied seizures or decline in his exercise tolerance. He has not reported any lymphadenopathy or pruritus. He has not reported any petechiae or bleeding.Remainder of his review of systems unremarkable. He does express some questions regarding the role of radiation therapy in his treatment in conjunction with chemotherapy as well as portable microwave technology.    Medications: I have reviewed the patient's current medications. Current Outpatient Prescriptions  Medication Sig Dispense Refill  . amLODipine (NORVASC) 5 MG tablet Take 5 mg by mouth every evening.     [name] aspirin 81 MG tablet Take 81 mg by mouth daily.      . calcium carbonate (OS-CAL) 1250 MG chewable tablet Chew 2 tablets by mouth daily.    [name] HYDROcodone-acetaminophen (NORCO) 5-325 MG per tablet Take 1-2 tablets by mouth every 4 (four) hours as needed for moderate pain. 30 tablet 0  . leuprolide (LUPRON) 30 MG injection Inject 30 mg into the muscle every 4 (four) months. LAST INJECTION JUNE 2015    . lidocaine-prilocaine (EMLA) cream Apply topically as needed. 1 g 2  . ZOSTAVAX 70962 UNT/0.65ML injection     . losartan-hydrochlorothiazide (HYZAAR) 100-12.5 MG per tablet Take 1 tablet by mouth every morning.     . ondansetron (ZOFRAN) 8 MG tablet  Take 8 mg by mouth every 8 (eight) hours as needed for nausea or vomiting.    . VESICARE 5 MG tablet      No current facility-administered medications for this visit.   Facility-Administered Medications Ordered in Other Visits  Medication Dose Route Frequency Provider Last Rate Last Dose  . sodium chloride 0.9 % injection 10 mL  10 mL Intracatheter PRN [name], MD   10 mL at 09/26/14 1209    Allergies: No Known  Allergies  Past Medical History, Surgical history, Social history, and Family History were reviewed and updated.    Physical Exam: Blood pressure 136/69, pulse 74, temperature 98.2 F (36.8 C), temperature source Oral, resp. rate 18, height 5\' 8"  (1.727 m), weight 238 lb 8 oz (108.183 kg), SpO2 100 %. ECOG: 1 General appearance: alert awake appeared in no active distress. Head: Normocephalic, without obvious abnormality, atraumatic Neck: no adenopathy Lymph nodes: Cervical, supraclavicular, and axillary nodes normal. Heart:regular rate and rhythm, S1, S2 normal, no murmur, click, rub or gallop Lung:chest clear, no wheezing, rales, normal symmetric air entry. Chest wall examination: Right anterior chest porta cath, well healed, non-tender, no evidence of infection  Abdomen: soft, non-tender, without masses or organomegaly EXT:1+ edema which is unchanged.   Lab Results: Lab Results  Component Value Date   WBC 6.2 09/25/2014   HGB 9.7* 09/25/2014   HCT 31.6* 09/25/2014   MCV 84.9 09/25/2014   PLT 217 09/25/2014     Chemistry      Component Value Date/Time   NA 144 09/25/2014 0947   NA 138 06/24/2014 0729   K 4.5 09/25/2014 0947   K 3.9 06/24/2014 0729   CL 104 02/06/2014 1005   CL 108* 03/09/2013 0923   CO2 25 09/25/2014 0947   CO2 26 02/06/2014 1005   BUN 11.9 09/25/2014 0947   BUN 12 02/06/2014 1005   CREATININE 0.9 09/25/2014 0947   CREATININE 0.89 02/06/2014 1005      Component Value Date/Time   CALCIUM 8.6 09/25/2014 0947   CALCIUM 9.4 02/06/2014 1005   ALKPHOS 64 09/25/2014 0947   ALKPHOS 71 12/27/2012 0415   AST 12 09/25/2014 0947   AST 17 12/27/2012 0415   ALT 11 09/25/2014 0947   ALT 24 12/27/2012 0415   BILITOT 0.30 09/25/2014 0947   BILITOT 0.3 12/27/2012 0415      Results for SWAN, [name] (MRN [id number]) as of 09/04/2014 09:12  Ref. Range 07/23/2014 09:18 08/14/2014 08:48 09/03/2014 09:13  PSA Latest Range: <=4.00 ng/mL 811.60 (H) 713.50  (H) 739.30 (H)     Impression and Plan:  This is a 68 year old gentleman with the following issues: 1. Castration-resistant prostate cancer with pelvic adenopathy. He is now on systemic chemotherapy in the form Jevtana  with Neulasta support. He has tolerated chemotherapy well at this time with reasonable response in his PSA. The plan is to continue the current dose and schedule and he will receive chemotherapy today and another cycle in 3 weeks. 2. IV access: Status post right anterior chest Port-A-Cath placement on 02/06/2014. Continue EMLA cream as needed. No complications at this time. 3. Hydronephrosis: He is S/P stent placement. 4. Hormonal deprivation.  He continues to be on Lupron. Last given September 2015 and will be repeated in February 2016. 5. 3.8 cm enhancing mass in the upper pole of the left kidney likely renal cell carcinoma. We will continue  observation and will followup on at with his next CT scan. 6. Lower  extremity edema-patient advised to decrease sodium intake. We will monitor closely on subsequent visits. 7. Followup will be in in 3 week for the start of cycle 7 Jevtana chemotherapy. This appointment will be with Dr. Alen Blew. [name] will discuss his questions about radiation therapy and microwave technology with Dr. Alen Blew.   Aveya Beal E, PA-C  12/17/20152:45 PM

## 2014-09-27 ENCOUNTER — Ambulatory Visit (HOSPITAL_BASED_OUTPATIENT_CLINIC_OR_DEPARTMENT_OTHER): Payer: Medicare Other

## 2014-09-27 DIAGNOSIS — C61 Malignant neoplasm of prostate: Secondary | ICD-10-CM

## 2014-09-27 MED ORDER — PEGFILGRASTIM INJECTION 6 MG/0.6ML ~~LOC~~
6.0000 mg | PREFILLED_SYRINGE | Freq: Once | SUBCUTANEOUS | Status: AC
  Administered 2014-09-27: 6 mg via SUBCUTANEOUS
  Filled 2014-09-27: qty 0.6

## 2014-09-27 NOTE — Patient Instructions (Signed)
Pegfilgrastim injection What is this medicine? PEGFILGRASTIM (peg fil GRA stim) is a long-acting granulocyte colony-stimulating factor that stimulates the growth of neutrophils, a type of white blood cell important in the body's fight against infection. It is used to reduce the incidence of fever and infection in patients with certain types of cancer who are receiving chemotherapy that affects the bone marrow. This medicine may be used for other purposes; ask your health care provider or pharmacist if you have questions. COMMON BRAND NAME(S): Neulasta What should I tell my health care provider before I take this medicine? They need to know if you have any of these conditions: -latex allergy -ongoing radiation therapy -sickle cell disease -skin reactions to acrylic adhesives (On-Body Injector only) -an unusual or allergic reaction to pegfilgrastim, filgrastim, other medicines, foods, dyes, or preservatives -pregnant or trying to get pregnant -breast-feeding How should I use this medicine? This medicine is for injection under the skin. If you get this medicine at home, you will be taught how to prepare and give the pre-filled syringe or how to use the On-body Injector. Refer to the patient Instructions for Use for detailed instructions. Use exactly as directed. Take your medicine at regular intervals. Do not take your medicine more often than directed. It is important that you put your used needles and syringes in a special sharps container. Do not put them in a trash can. If you do not have a sharps container, call your pharmacist or healthcare provider to get one. Talk to your pediatrician regarding the use of this medicine in children. Special care may be needed. Overdosage: If you think you have taken too much of this medicine contact a poison control center or emergency room at once. NOTE: This medicine is only for you. Do not share this medicine with others. What if I miss a dose? It is  important not to miss your dose. Call your doctor or health care professional if you miss your dose. If you miss a dose due to an On-body Injector failure or leakage, a new dose should be administered as soon as possible using a single prefilled syringe for manual use. What may interact with this medicine? Interactions have not been studied. Give your health care provider a list of all the medicines, herbs, non-prescription drugs, or dietary supplements you use. Also tell them if you smoke, drink alcohol, or use illegal drugs. Some items may interact with your medicine. This list may not describe all possible interactions. Give your health care provider a list of all the medicines, herbs, non-prescription drugs, or dietary supplements you use. Also tell them if you smoke, drink alcohol, or use illegal drugs. Some items may interact with your medicine. What should I watch for while using this medicine? You may need blood work done while you are taking this medicine. If you are going to need a MRI, CT scan, or other procedure, tell your doctor that you are using this medicine (On-Body Injector only). What side effects may I notice from receiving this medicine? Side effects that you should report to your doctor or health care professional as soon as possible: -allergic reactions like skin rash, itching or hives, swelling of the face, lips, or tongue -dizziness -fever -pain, redness, or irritation at site where injected -pinpoint red spots on the skin -shortness of breath or breathing problems -stomach or side pain, or pain at the shoulder -swelling -tiredness -trouble passing urine Side effects that usually do not require medical attention (report to your doctor   or health care professional if they continue or are bothersome): -bone pain -muscle pain This list may not describe all possible side effects. Call your doctor for medical advice about side effects. You may report side effects to FDA at  1-800-FDA-1088. Where should I keep my medicine? Keep out of the reach of children. Store pre-filled syringes in a refrigerator between 2 and 8 degrees C (36 and 46 degrees F). Do not freeze. Keep in carton to protect from light. Throw away this medicine if it is left out of the refrigerator for more than 48 hours. Throw away any unused medicine after the expiration date. NOTE: This sheet is a summary. It may not cover all possible information. If you have questions about this medicine, talk to your doctor, pharmacist, or health care provider.  2015, [employer]. (2013-12-27 16:14:05)  

## 2014-10-16 ENCOUNTER — Other Ambulatory Visit (HOSPITAL_BASED_OUTPATIENT_CLINIC_OR_DEPARTMENT_OTHER): Payer: Medicare Other

## 2014-10-16 ENCOUNTER — Telehealth: Payer: Self-pay | Admitting: *Deleted

## 2014-10-16 DIAGNOSIS — C61 Malignant neoplasm of prostate: Secondary | ICD-10-CM

## 2014-10-16 LAB — CBC WITH DIFFERENTIAL/PLATELET
BASO%: 0.3 % (ref 0.0–2.0)
BASOS ABS: 0 10*3/uL (ref 0.0–0.1)
EOS ABS: 0.2 10*3/uL (ref 0.0–0.5)
EOS%: 2.6 % (ref 0.0–7.0)
HCT: 33.6 % — ABNORMAL LOW (ref 38.4–49.9)
HGB: 10.3 g/dL — ABNORMAL LOW (ref 13.0–17.1)
LYMPH%: 22.7 % (ref 14.0–49.0)
MCH: 26.1 pg — ABNORMAL LOW (ref 27.2–33.4)
MCHC: 30.7 g/dL — AB (ref 32.0–36.0)
MCV: 84.9 fL (ref 79.3–98.0)
MONO#: 0.5 10*3/uL (ref 0.1–0.9)
MONO%: 7.4 % (ref 0.0–14.0)
NEUT%: 67 % (ref 39.0–75.0)
NEUTROS ABS: 4.9 10*3/uL (ref 1.5–6.5)
PLATELETS: 249 10*3/uL (ref 140–400)
RBC: 3.96 10*6/uL — ABNORMAL LOW (ref 4.20–5.82)
RDW: 20.6 % — ABNORMAL HIGH (ref 11.0–14.6)
WBC: 7.4 10*3/uL (ref 4.0–10.3)
lymph#: 1.7 10*3/uL (ref 0.9–3.3)

## 2014-10-16 LAB — COMPREHENSIVE METABOLIC PANEL (CC13)
ALBUMIN: 3.9 g/dL (ref 3.5–5.0)
ALT: 9 U/L (ref 0–55)
AST: 12 U/L (ref 5–34)
Alkaline Phosphatase: 73 U/L (ref 40–150)
Anion Gap: 6 mEq/L (ref 3–11)
BUN: 13.8 mg/dL (ref 7.0–26.0)
CO2: 29 mEq/L (ref 22–29)
Calcium: 9.2 mg/dL (ref 8.4–10.4)
Chloride: 108 mEq/L (ref 98–109)
Creatinine: 0.9 mg/dL (ref 0.7–1.3)
GLUCOSE: 109 mg/dL (ref 70–140)
Potassium: 4.2 mEq/L (ref 3.5–5.1)
Sodium: 143 mEq/L (ref 136–145)
Total Bilirubin: 0.33 mg/dL (ref 0.20–1.20)
Total Protein: 7.1 g/dL (ref 6.4–8.3)

## 2014-10-16 NOTE — Telephone Encounter (Signed)
Per pharmacy I have scheduled appt 

## 2014-10-17 ENCOUNTER — Ambulatory Visit (HOSPITAL_BASED_OUTPATIENT_CLINIC_OR_DEPARTMENT_OTHER): Payer: Medicare Other

## 2014-10-17 ENCOUNTER — Telehealth: Payer: Self-pay | Admitting: Oncology

## 2014-10-17 ENCOUNTER — Ambulatory Visit: Payer: Medicare Other

## 2014-10-17 ENCOUNTER — Ambulatory Visit (HOSPITAL_BASED_OUTPATIENT_CLINIC_OR_DEPARTMENT_OTHER): Payer: Medicare Other | Admitting: Oncology

## 2014-10-17 VITALS — BP 121/68 | HR 75 | Temp 97.7°F | Resp 18 | Ht 68.0 in | Wt 234.7 lb

## 2014-10-17 DIAGNOSIS — C61 Malignant neoplasm of prostate: Secondary | ICD-10-CM

## 2014-10-17 DIAGNOSIS — Z5111 Encounter for antineoplastic chemotherapy: Secondary | ICD-10-CM | POA: Diagnosis not present

## 2014-10-17 DIAGNOSIS — N289 Disorder of kidney and ureter, unspecified: Secondary | ICD-10-CM

## 2014-10-17 LAB — PSA: PSA: 728.6 ng/mL — AB (ref <=4.00)

## 2014-10-17 MED ORDER — FAMOTIDINE IN NACL 20-0.9 MG/50ML-% IV SOLN
INTRAVENOUS | Status: AC
  Filled 2014-10-17: qty 50

## 2014-10-17 MED ORDER — DEXAMETHASONE SODIUM PHOSPHATE 20 MG/5ML IJ SOLN
INTRAMUSCULAR | Status: AC
  Filled 2014-10-17: qty 5

## 2014-10-17 MED ORDER — DEXTROSE 5 % IV SOLN
26.0000 mg/m2 | Freq: Once | INTRAVENOUS | Status: AC
  Administered 2014-10-17: 60 mg via INTRAVENOUS
  Filled 2014-10-17: qty 6

## 2014-10-17 MED ORDER — HEPARIN SOD (PORK) LOCK FLUSH 100 UNIT/ML IV SOLN
500.0000 [IU] | Freq: Once | INTRAVENOUS | Status: AC | PRN
  Administered 2014-10-17: 500 [IU]
  Filled 2014-10-17: qty 5

## 2014-10-17 MED ORDER — SODIUM CHLORIDE 0.9 % IV SOLN
Freq: Once | INTRAVENOUS | Status: AC
  Administered 2014-10-17: 11:00:00 via INTRAVENOUS

## 2014-10-17 MED ORDER — FAMOTIDINE IN NACL 20-0.9 MG/50ML-% IV SOLN
20.0000 mg | Freq: Once | INTRAVENOUS | Status: AC
  Administered 2014-10-17: 20 mg via INTRAVENOUS

## 2014-10-17 MED ORDER — ONDANSETRON 8 MG/NS 50 ML IVPB
INTRAVENOUS | Status: AC
  Filled 2014-10-17: qty 8

## 2014-10-17 MED ORDER — DIPHENHYDRAMINE HCL 50 MG/ML IJ SOLN
25.0000 mg | Freq: Once | INTRAMUSCULAR | Status: AC
  Administered 2014-10-17: 25 mg via INTRAVENOUS

## 2014-10-17 MED ORDER — SODIUM CHLORIDE 0.9 % IJ SOLN
10.0000 mL | INTRAMUSCULAR | Status: DC | PRN
  Administered 2014-10-17: 10 mL
  Filled 2014-10-17: qty 10

## 2014-10-17 MED ORDER — ONDANSETRON 8 MG/50ML IVPB (CHCC)
8.0000 mg | Freq: Once | INTRAVENOUS | Status: AC
  Administered 2014-10-17: 8 mg via INTRAVENOUS

## 2014-10-17 MED ORDER — DEXAMETHASONE SODIUM PHOSPHATE 20 MG/5ML IJ SOLN
12.0000 mg | Freq: Once | INTRAMUSCULAR | Status: AC
  Administered 2014-10-17: 12 mg via INTRAVENOUS

## 2014-10-17 MED ORDER — DIPHENHYDRAMINE HCL 50 MG/ML IJ SOLN
INTRAMUSCULAR | Status: AC
  Filled 2014-10-17: qty 1

## 2014-10-17 MED ORDER — LEUPROLIDE ACETATE (4 MONTH) 30 MG IM KIT
30.0000 mg | PACK | Freq: Once | INTRAMUSCULAR | Status: AC
  Administered 2014-10-17: 30 mg via INTRAMUSCULAR
  Filled 2014-10-17: qty 30

## 2014-10-17 NOTE — Progress Notes (Signed)
Hematology and Oncology Follow Up Visit  [name] [id number] 08-31-1946 69 y.o. 10/17/2014 10:11 AM   [name], [name]  [name], [name]  Principle Diagnosis:This is a 69 year old gentleman with prostate cancer initially diagnosed in 2001.  He had a Gleason score of 3 + 3 = 6.  He has castration-resistant disease with pelvic adenopathy   Prior Therapy:  1. Status post prostatectomy done in May 2001.  He had T3 N1 disease.  PSA nadir to 0.  2. The patient developed biochemical relapse and received prostatic bed radiation.  3. The patient received Lupron with Casodex due to rising PSA.  The patient subsequently developed castration-resistant disease. 4. Patient treated with Casodex withdrawal and subsequently PSA rose to 18. 5. Patient with Provenge immunotherapy completed in July 2011. 6.  He received ketoconazole and prednisone December 2011 through January 2014. 7.         He is S/P Zytiga 1000 mg daily with Prednisone 5 mg daily on 11/03/12 till 07/2013. This was stopped due to progression of disease.  8.  Xtandi 1000 mg daily 07/2013 through 01/24/2014. Discontinued secondary to disease progression 9.         Docetaxel 75 mg/m2 with Neulasta support, status post 5 cycles. Discontinued 06/05/2014 secondary to disease progression.  Current therapy: 1. Systemic chemotherapy with Jevtana 25 mg/m2 given every 3 weeks. Status post 6 cycles. 2. He is on Lupron 30 mg every 4 months. Last time given on 07/10/2014.  Interim History: [name] presents today for a followup visit with his wife. Since the last visit, he has no new complaints. He continues to tolerate Jevtana so far without difficulty.  He is not reporting any nausea.  He is not reporting any vomiting. He denies any neuropathy or infusion related complications. He has reported loose bowel habits at times especially with increased intra-abdominal pressure with urination. He does not report any abdominal pain  or vomiting. He continues to have some lower extremity edema which has not changed.  Overall, his performance status and activity levels continue to be close to base line.  He continues to drive and perform activities of daily living without any hindrance or decline.  Had not reported any genitourinary complaints or bleeding.  He has not reported any neurological complaints. Denied seizures or decline in his exercise tolerance. He has not reported any lymphadenopathy or pruritus. He has not reported any petechiae or bleeding.Remainder of his review of systems unremarkable.     Medications: I have reviewed the patient's current medications. Current Outpatient Prescriptions  Medication Sig Dispense Refill  . amLODipine (NORVASC) 5 MG tablet Take 5 mg by mouth every evening.     [name] aspirin 81 MG tablet Take 81 mg by mouth daily.      . calcium carbonate (OS-CAL) 1250 MG chewable tablet Chew 2 tablets by mouth daily.    [name] HYDROcodone-acetaminophen (NORCO) 5-325 MG per tablet Take 1-2 tablets by mouth every 4 (four) hours as needed for moderate pain. 30 tablet 0  . leuprolide (LUPRON) 30 MG injection Inject 30 mg into the muscle every 4 (four) months. LAST INJECTION JUNE 2015    . lidocaine-prilocaine (EMLA) cream Apply topically as needed. 1 g 2  . ondansetron (ZOFRAN) 8 MG tablet Take 8 mg by mouth every 8 (eight) hours as needed for nausea or vomiting.    . VESICARE 5 MG tablet     . ZOSTAVAX 40981 UNT/0.65ML injection  No current facility-administered medications for this visit.    Allergies: No Known Allergies  Past Medical History, Surgical history, Social history, and Family History were reviewed and updated.    Physical Exam: Blood pressure 121/68, pulse 75, temperature 97.7 F (36.5 C), resp. rate 18, height 5\' 8"  (1.727 m), weight 234 lb 11.2 oz (106.459 kg), SpO2 100 %. ECOG: 1 General appearance: alert awake appeared in no active distress. Head: Normocephalic, without obvious  abnormality, atraumatic Neck: no adenopathy Lymph nodes: Cervical, supraclavicular, and axillary nodes normal. Heart:regular rate and rhythm, S1, S2 normal, no murmur, click, rub or gallop Lung:chest clear, no wheezing, rales, normal symmetric air entry. Chest wall examination: Right anterior chest porta cath, well healed. Abdomen: soft, non-tender, without masses or organomegaly EXT:1+ edema which is unchanged.   Lab Results: Lab Results  Component Value Date   WBC 7.4 10/16/2014   HGB 10.3* 10/16/2014   HCT 33.6* 10/16/2014   MCV 84.9 10/16/2014   PLT 249 10/16/2014     Chemistry      Component Value Date/Time   NA 143 10/16/2014 0936   NA 138 06/24/2014 0729   K 4.2 10/16/2014 0936   K 3.9 06/24/2014 0729   CL 104 02/06/2014 1005   CL 108* 03/09/2013 0923   CO2 29 10/16/2014 0936   CO2 26 02/06/2014 1005   BUN 13.8 10/16/2014 0936   BUN 12 02/06/2014 1005   CREATININE 0.9 10/16/2014 0936   CREATININE 0.89 02/06/2014 1005      Component Value Date/Time   CALCIUM 9.2 10/16/2014 0936   CALCIUM 9.4 02/06/2014 1005   ALKPHOS 73 10/16/2014 0936   ALKPHOS 71 12/27/2012 0415   AST 12 10/16/2014 0936   AST 17 12/27/2012 0415   ALT 9 10/16/2014 0936   ALT 24 12/27/2012 0415   BILITOT 0.33 10/16/2014 0936   BILITOT 0.3 12/27/2012 0415       Results for [name], [name] (MRN [id number]) as of 10/17/2014 09:56  Ref. Range 09/03/2014 09:13 09/25/2014 09:48 10/16/2014 09:37  PSA Latest Range: <=4.00 ng/mL 739.30 (H) 655.80 (H) 728.60 (H)     Impression and Plan:  This is a 69 year old gentleman with the following issues: 1. Castration-resistant prostate cancer with pelvic adenopathy. He is now on systemic chemotherapy in the form Jevtana  with Neulasta support. He has tolerated chemotherapy well at this time with reasonable response in his PSA. His PSA has dropped from around 1100 down to the 700 range. The plan is to continue the current dose and schedule and he will  receive chemotherapy today and another cycle in 3 weeks. 2. IV access: Status post right anterior chest Port-A-Cath placement on 02/06/2014. Continue EMLA cream as needed. No complications at this time. 3. Hydronephrosis: He is S/P stent placement. 4. Hormonal deprivation.  He continues to be on Lupron. Last given September 2015 and will be repeated in February 2016. 5. 3.8 cm enhancing mass in the upper pole of the left kidney likely renal cell carcinoma. We will continue  observation and will followup on at with his next CT scan. 6. Loose bowel movements: I have recommended taking Imodium to combat any diarrheal illness. 7. Followup will be in in 3 week for the start of cycle 8 Jevtana chemotherapy.    CBULAG,TXMIW, MD 1/7/201610:11 AM

## 2014-10-17 NOTE — Patient Instructions (Signed)
[facility] Discharge Instructions for Patients Receiving Chemotherapy  Today you received the following chemotherapy agents: Jevtana.  To help prevent nausea and vomiting after your treatment, we encourage you to take your nausea medication: Zofran. Take one every 8 hours as needed.   If you develop nausea and vomiting that is not controlled by your nausea medication, call the clinic.   BELOW ARE SYMPTOMS THAT SHOULD BE REPORTED IMMEDIATELY:  *FEVER GREATER THAN 100.5 F  *CHILLS WITH OR WITHOUT FEVER  NAUSEA AND VOMITING THAT IS NOT CONTROLLED WITH YOUR NAUSEA MEDICATION  *UNUSUAL SHORTNESS OF BREATH  *UNUSUAL BRUISING OR BLEEDING  TENDERNESS IN MOUTH AND THROAT WITH OR WITHOUT PRESENCE OF ULCERS  *URINARY PROBLEMS  *BOWEL PROBLEMS  UNUSUAL RASH Items with * indicate a potential emergency and should be followed up as soon as possible.  Feel free to call the clinic should you have any questions or concerns. The clinic phone number is [phone].   Leuprolide injection What is this medicine? LEUPROLIDE (loo PROE lide) is a man-made hormone. It is used to treat the symptoms of prostate cancer. This medicine may also be used to treat children with early onset of puberty. It may be used for other hormonal conditions. This medicine may be used for other purposes; ask your health care provider or pharmacist if you have questions. COMMON BRAND NAME(S): Lupron What should I tell my health care provider before I take this medicine? They need to know if you have any of these conditions: -diabetes -heart disease or previous heart attack -high blood pressure -high cholesterol -pain or difficulty passing urine -spinal cord metastasis -stroke -tobacco smoker -an unusual or allergic reaction to leuprolide, benzyl alcohol, other medicines, foods, dyes, or preservatives -pregnant or trying to get pregnant -breast-feeding How should I use this medicine? This  medicine is for injection under the skin or into a muscle. You will be taught how to prepare and give this medicine. Use exactly as directed. Take your medicine at regular intervals. Do not take your medicine more often than directed. It is important that you put your used needles and syringes in a special sharps container. Do not put them in a trash can. If you do not have a sharps container, call your pharmacist or healthcare provider to get one. Talk to your pediatrician regarding the use of this medicine in children. While this medicine may be prescribed for children as young as 8 years for selected conditions, precautions do apply. Overdosage: If you think you have taken too much of this medicine contact a poison control center or emergency room at once. NOTE: This medicine is only for you. Do not share this medicine with others. What if I miss a dose? If you miss a dose, take it as soon as you can. If it is almost time for your next dose, take only that dose. Do not take double or extra doses. What may interact with this medicine? Do not take this medicine with any of the following medications: -chasteberry This medicine may also interact with the following medications: -herbal or dietary supplements, like black cohosh or DHEA -male hormones, like estrogens or progestins and birth control pills, patches, rings, or injections -male hormones, like testosterone This list may not describe all possible interactions. Give your health care provider a list of all the medicines, herbs, non-prescription drugs, or dietary supplements you use. Also tell them if you smoke, drink alcohol, or use illegal drugs. Some items may interact with your  medicine. What should I watch for while using this medicine? Visit your doctor or health care professional for regular checks on your progress. During the first week, your symptoms may get worse, but then will improve as you continue your treatment. You may get hot  flashes, increased bone pain, increased difficulty passing urine, or an aggravation of nerve symptoms. Discuss these effects with your doctor or health care professional, some of them may improve with continued use of this medicine. Male patients may experience a menstrual cycle or spotting during the first 2 months of therapy with this medicine. If this continues, contact your doctor or health care professional. What side effects may I notice from receiving this medicine? Side effects that you should report to your doctor or health care professional as soon as possible: -allergic reactions like skin rash, itching or hives, swelling of the face, lips, or tongue -breathing problems -chest pain -depression or memory disorders -pain in your legs or groin -pain at site where injected -severe headache -swelling of the feet and legs -visual changes -vomiting Side effects that usually do not require medical attention (report to your doctor or health care professional if they continue or are bothersome): -breast swelling or tenderness -decrease in sex drive or performance -diarrhea -hot flashes -loss of appetite -muscle, joint, or bone pains -nausea -redness or irritation at site where injected -skin problems or acne This list may not describe all possible side effects. Call your doctor for medical advice about side effects. You may report side effects to FDA at 1-800-FDA-1088. Where should I keep my medicine? Keep out of the reach of children. Store below 25 degrees C (77 degrees F). Do not freeze. Protect from light. Do not use if it is not clear or if there are particles present. Throw away any unused medicine after the expiration date. NOTE: This sheet is a summary. It may not cover all possible information. If you have questions about this medicine, talk to your doctor, pharmacist, or health care provider.  2015, [employer]. (2009-02-11 13:26:20)

## 2014-10-17 NOTE — Telephone Encounter (Signed)
gv and printed appt sched and avs for pt for Jan adn Feb...sed added tx.   °

## 2014-10-18 ENCOUNTER — Ambulatory Visit (HOSPITAL_BASED_OUTPATIENT_CLINIC_OR_DEPARTMENT_OTHER): Payer: Medicare Other

## 2014-10-18 ENCOUNTER — Telehealth: Payer: Self-pay | Admitting: *Deleted

## 2014-10-18 ENCOUNTER — Telehealth: Payer: Self-pay | Admitting: Oncology

## 2014-10-18 DIAGNOSIS — C61 Malignant neoplasm of prostate: Secondary | ICD-10-CM

## 2014-10-18 MED ORDER — PEGFILGRASTIM INJECTION 6 MG/0.6ML ~~LOC~~
6.0000 mg | PREFILLED_SYRINGE | Freq: Once | SUBCUTANEOUS | Status: AC
  Administered 2014-10-18: 6 mg via SUBCUTANEOUS
  Filled 2014-10-18: qty 0.6

## 2014-10-18 NOTE — Patient Instructions (Signed)
Pegfilgrastim injection What is this medicine? PEGFILGRASTIM (peg fil GRA stim) is a long-acting granulocyte colony-stimulating factor that stimulates the growth of neutrophils, a type of white blood cell important in the body's fight against infection. It is used to reduce the incidence of fever and infection in patients with certain types of cancer who are receiving chemotherapy that affects the bone marrow. This medicine may be used for other purposes; ask your health care provider or pharmacist if you have questions. COMMON BRAND NAME(S): Neulasta What should I tell my health care provider before I take this medicine? They need to know if you have any of these conditions: -latex allergy -ongoing radiation therapy -sickle cell disease -skin reactions to acrylic adhesives (On-Body Injector only) -an unusual or allergic reaction to pegfilgrastim, filgrastim, other medicines, foods, dyes, or preservatives -pregnant or trying to get pregnant -breast-feeding How should I use this medicine? This medicine is for injection under the skin. If you get this medicine at home, you will be taught how to prepare and give the pre-filled syringe or how to use the On-body Injector. Refer to the patient Instructions for Use for detailed instructions. Use exactly as directed. Take your medicine at regular intervals. Do not take your medicine more often than directed. It is important that you put your used needles and syringes in a special sharps container. Do not put them in a trash can. If you do not have a sharps container, call your pharmacist or healthcare provider to get one. Talk to your pediatrician regarding the use of this medicine in children. Special care may be needed. Overdosage: If you think you have taken too much of this medicine contact a poison control center or emergency room at once. NOTE: This medicine is only for you. Do not share this medicine with others. What if I miss a dose? It is  important not to miss your dose. Call your doctor or health care professional if you miss your dose. If you miss a dose due to an On-body Injector failure or leakage, a new dose should be administered as soon as possible using a single prefilled syringe for manual use. What may interact with this medicine? Interactions have not been studied. Give your health care provider a list of all the medicines, herbs, non-prescription drugs, or dietary supplements you use. Also tell them if you smoke, drink alcohol, or use illegal drugs. Some items may interact with your medicine. This list may not describe all possible interactions. Give your health care provider a list of all the medicines, herbs, non-prescription drugs, or dietary supplements you use. Also tell them if you smoke, drink alcohol, or use illegal drugs. Some items may interact with your medicine. What should I watch for while using this medicine? You may need blood work done while you are taking this medicine. If you are going to need a MRI, CT scan, or other procedure, tell your doctor that you are using this medicine (On-Body Injector only). What side effects may I notice from receiving this medicine? Side effects that you should report to your doctor or health care professional as soon as possible: -allergic reactions like skin rash, itching or hives, swelling of the face, lips, or tongue -dizziness -fever -pain, redness, or irritation at site where injected -pinpoint red spots on the skin -shortness of breath or breathing problems -stomach or side pain, or pain at the shoulder -swelling -tiredness -trouble passing urine Side effects that usually do not require medical attention (report to your doctor   or health care professional if they continue or are bothersome): -bone pain -muscle pain This list may not describe all possible side effects. Call your doctor for medical advice about side effects. You may report side effects to FDA at  1-800-FDA-1088. Where should I keep my medicine? Keep out of the reach of children. Store pre-filled syringes in a refrigerator between 2 and 8 degrees C (36 and 46 degrees F). Do not freeze. Keep in carton to protect from light. Throw away this medicine if it is left out of the refrigerator for more than 48 hours. Throw away any unused medicine after the expiration date. NOTE: This sheet is a summary. It may not cover all possible information. If you have questions about this medicine, talk to your doctor, pharmacist, or health care provider.  2015, [employer]. (2013-12-27 16:14:05)  

## 2014-10-18 NOTE — Telephone Encounter (Signed)
gv adn printed appt sched for pt for lab moved to same day as visit on 1.28

## 2014-10-18 NOTE — Telephone Encounter (Signed)
Unable to leave message at both home and mobile numbers to tell pt that his injection appt is too soon after receiving chemo treatment yesterday.

## 2014-10-21 ENCOUNTER — Other Ambulatory Visit: Payer: Self-pay | Admitting: Urology

## 2014-10-21 DIAGNOSIS — C61 Malignant neoplasm of prostate: Secondary | ICD-10-CM

## 2014-10-22 ENCOUNTER — Encounter: Payer: Self-pay | Admitting: *Deleted

## 2014-11-01 ENCOUNTER — Encounter (HOSPITAL_COMMUNITY)
Admission: RE | Admit: 2014-11-01 | Discharge: 2014-11-01 | Disposition: A | Discharge status: Home / Self Care | Payer: Medicare Other | Source: Ambulatory Visit | Attending: Urology | Admitting: Urology

## 2014-11-01 ENCOUNTER — Encounter (HOSPITAL_COMMUNITY): Payer: Self-pay

## 2014-11-01 DIAGNOSIS — C61 Malignant neoplasm of prostate: Secondary | ICD-10-CM | POA: Diagnosis present

## 2014-11-01 MED ORDER — TECHNETIUM TC 99M MEDRONATE IV KIT
26.3000 | PACK | Freq: Once | INTRAVENOUS | Status: AC | PRN
  Administered 2014-11-01: 26.3 via INTRAVENOUS

## 2014-11-06 ENCOUNTER — Other Ambulatory Visit: Payer: Medicare Other

## 2014-11-07 ENCOUNTER — Other Ambulatory Visit (HOSPITAL_BASED_OUTPATIENT_CLINIC_OR_DEPARTMENT_OTHER): Payer: Medicare Other

## 2014-11-07 ENCOUNTER — Ambulatory Visit: Payer: PRIVATE HEALTH INSURANCE

## 2014-11-07 ENCOUNTER — Encounter: Payer: Self-pay | Admitting: Physician Assistant

## 2014-11-07 ENCOUNTER — Ambulatory Visit (HOSPITAL_BASED_OUTPATIENT_CLINIC_OR_DEPARTMENT_OTHER): Payer: PRIVATE HEALTH INSURANCE | Admitting: Physician Assistant

## 2014-11-07 ENCOUNTER — Ambulatory Visit (HOSPITAL_BASED_OUTPATIENT_CLINIC_OR_DEPARTMENT_OTHER): Payer: PRIVATE HEALTH INSURANCE

## 2014-11-07 VITALS — BP 130/69 | HR 65 | Temp 98.1°F | Resp 18 | Ht 68.0 in | Wt 233.5 lb

## 2014-11-07 DIAGNOSIS — C61 Malignant neoplasm of prostate: Secondary | ICD-10-CM

## 2014-11-07 DIAGNOSIS — N2889 Other specified disorders of kidney and ureter: Secondary | ICD-10-CM

## 2014-11-07 DIAGNOSIS — E291 Testicular hypofunction: Secondary | ICD-10-CM

## 2014-11-07 DIAGNOSIS — C7951 Secondary malignant neoplasm of bone: Secondary | ICD-10-CM

## 2014-11-07 DIAGNOSIS — Z95828 Presence of other vascular implants and grafts: Secondary | ICD-10-CM

## 2014-11-07 DIAGNOSIS — Z5111 Encounter for antineoplastic chemotherapy: Secondary | ICD-10-CM

## 2014-11-07 LAB — CBC WITH DIFFERENTIAL/PLATELET
BASO%: 0.6 % (ref 0.0–2.0)
BASOS ABS: 0 10*3/uL (ref 0.0–0.1)
EOS ABS: 0.1 10*3/uL (ref 0.0–0.5)
EOS%: 1.6 % (ref 0.0–7.0)
HCT: 32.2 % — ABNORMAL LOW (ref 38.4–49.9)
HEMOGLOBIN: 9.8 g/dL — AB (ref 13.0–17.1)
LYMPH%: 22 % (ref 14.0–49.0)
MCH: 25.9 pg — AB (ref 27.2–33.4)
MCHC: 30.4 g/dL — AB (ref 32.0–36.0)
MCV: 85 fL (ref 79.3–98.0)
MONO#: 0.6 10*3/uL (ref 0.1–0.9)
MONO%: 9.6 % (ref 0.0–14.0)
NEUT#: 4.2 10*3/uL (ref 1.5–6.5)
NEUT%: 66.2 % (ref 39.0–75.0)
Platelets: 212 10*3/uL (ref 140–400)
RBC: 3.79 10*6/uL — ABNORMAL LOW (ref 4.20–5.82)
RDW: 19.1 % — AB (ref 11.0–14.6)
WBC: 6.4 10*3/uL (ref 4.0–10.3)
lymph#: 1.4 10*3/uL (ref 0.9–3.3)

## 2014-11-07 LAB — COMPREHENSIVE METABOLIC PANEL (CC13)
ALBUMIN: 3.6 g/dL (ref 3.5–5.0)
ALT: 7 U/L (ref 0–55)
AST: 11 U/L (ref 5–34)
Alkaline Phosphatase: 66 U/L (ref 40–150)
Anion Gap: 11 mEq/L (ref 3–11)
BILIRUBIN TOTAL: 0.37 mg/dL (ref 0.20–1.20)
BUN: 12.1 mg/dL (ref 7.0–26.0)
CALCIUM: 8.4 mg/dL (ref 8.4–10.4)
CO2: 21 mEq/L — ABNORMAL LOW (ref 22–29)
Chloride: 112 mEq/L — ABNORMAL HIGH (ref 98–109)
Creatinine: 0.9 mg/dL (ref 0.7–1.3)
EGFR: >90 mL/min/{1.73_m2} (ref >90)
GLUCOSE: 128 mg/dL (ref 70–140)
Potassium: 4.1 mEq/L (ref 3.5–5.1)
SODIUM: 144 meq/L (ref 136–145)
Total Protein: 6.5 g/dL (ref 6.4–8.3)

## 2014-11-07 MED ORDER — DIPHENHYDRAMINE HCL 50 MG/ML IJ SOLN
25.0000 mg | Freq: Once | INTRAMUSCULAR | Status: AC
  Administered 2014-11-07: 25 mg via INTRAVENOUS

## 2014-11-07 MED ORDER — ONDANSETRON 8 MG/50ML IVPB (CHCC)
8.0000 mg | Freq: Once | INTRAVENOUS | Status: AC
  Administered 2014-11-07: 8 mg via INTRAVENOUS

## 2014-11-07 MED ORDER — FAMOTIDINE IN NACL 20-0.9 MG/50ML-% IV SOLN
20.0000 mg | Freq: Once | INTRAVENOUS | Status: AC
  Administered 2014-11-07: 20 mg via INTRAVENOUS

## 2014-11-07 MED ORDER — DEXAMETHASONE SODIUM PHOSPHATE 20 MG/5ML IJ SOLN
INTRAMUSCULAR | Status: AC
Start: 2014-11-07 — End: 2014-11-07
  Filled 2014-11-07: qty 5

## 2014-11-07 MED ORDER — DEXTROSE 5 % IV SOLN
26.0000 mg/m2 | Freq: Once | INTRAVENOUS | Status: AC
  Administered 2014-11-07: 60 mg via INTRAVENOUS
  Filled 2014-11-07: qty 6

## 2014-11-07 MED ORDER — ONDANSETRON 8 MG/NS 50 ML IVPB
INTRAVENOUS | Status: AC
  Filled 2014-11-07: qty 8

## 2014-11-07 MED ORDER — DIPHENHYDRAMINE HCL 50 MG/ML IJ SOLN
INTRAMUSCULAR | Status: AC
  Filled 2014-11-07: qty 1

## 2014-11-07 MED ORDER — HEPARIN SOD (PORK) LOCK FLUSH 100 UNIT/ML IV SOLN
500.0000 [IU] | Freq: Once | INTRAVENOUS | Status: AC | PRN
  Administered 2014-11-07: 500 [IU]
  Filled 2014-11-07: qty 5

## 2014-11-07 MED ORDER — SODIUM CHLORIDE 0.9 % IJ SOLN
10.0000 mL | INTRAMUSCULAR | Status: DC | PRN
  Administered 2014-11-07: 10 mL
  Filled 2014-11-07: qty 10

## 2014-11-07 MED ORDER — FAMOTIDINE IN NACL 20-0.9 MG/50ML-% IV SOLN
INTRAVENOUS | Status: AC
  Filled 2014-11-07: qty 50

## 2014-11-07 MED ORDER — SODIUM CHLORIDE 0.9 % IV SOLN
Freq: Once | INTRAVENOUS | Status: AC
  Administered 2014-11-07: 10:00:00 via INTRAVENOUS

## 2014-11-07 MED ORDER — SODIUM CHLORIDE 0.9 % IJ SOLN
10.0000 mL | INTRAMUSCULAR | Status: DC | PRN
  Administered 2014-11-07: 10 mL via INTRAVENOUS
  Filled 2014-11-07: qty 10

## 2014-11-07 MED ORDER — DEXAMETHASONE SODIUM PHOSPHATE 20 MG/5ML IJ SOLN
12.0000 mg | Freq: Once | INTRAMUSCULAR | Status: AC
  Administered 2014-11-07: 12 mg via INTRAVENOUS

## 2014-11-07 NOTE — Patient Instructions (Signed)
Procedure dentist for written dental clearance prior to starting either Zometa or Xgeva to help your bones Follow-up in 3 weeks prior to next scheduled cycle of chemotherapy

## 2014-11-07 NOTE — Progress Notes (Signed)
Hematology and Oncology Follow Up Visit  [name] [id number] 10-16-1945 69 y.o. 11/07/2014 9:17 AM   [name], [name]  [name], [name]  Principle Diagnosis:This is a 69 year old gentleman with prostate cancer initially diagnosed in 2001.  He had a Gleason score of 3 + 3 = 6.  He has castration-resistant disease with pelvic adenopathy   Prior Therapy:  1. Status post prostatectomy done in May 2001.  He had T3 N1 disease.  PSA nadir to 0.  2. The patient developed biochemical relapse and received prostatic bed radiation.  3. The patient received Lupron with Casodex due to rising PSA.  The patient subsequently developed castration-resistant disease. 4. Patient treated with Casodex withdrawal and subsequently PSA rose to 18. 5. Patient with Provenge immunotherapy completed in July 2011. 6.  He received ketoconazole and prednisone December 2011 through January 2014. 7.         He is S/P Zytiga 1000 mg daily with Prednisone 5 mg daily on 11/03/12 till 07/2013. This was stopped due to progression of disease.  8.  Xtandi 1000 mg daily 07/2013 through 01/24/2014. Discontinued secondary to disease progression 9.         Docetaxel 75 mg/m2 with Neulasta support, status post 5 cycles. Discontinued 06/05/2014 secondary to disease progression.  Current therapy: 1. Systemic chemotherapy with Jevtana 25 mg/m2 given every 3 weeks. Status post 7 cycles. 2. He is on Lupron 30 mg every 4 months. Last time given on 07/10/2014.  Interim History: [name] presents today for a followup visit with his wife. Since the last visit, he has no new complaints. He continues to tolerate Jevtana so far without difficulty.  He is not reporting any nausea.  He is not reporting any vomiting. He denies any neuropathy or infusion related complications. He has reported loose bowel habits at times especially with increased intra-abdominal pressure with urination. He does not report any abdominal pain  or vomiting. He continues to have some lower extremity edema which has not changed.  Overall, his performance status and activity levels continue to be close to base line.  He continues to drive and perform activities of daily living without any hindrance or decline.  Had not reported any genitourinary complaints or bleeding. He reports some burning in his groin area projected with walking that is been present for a long period of time. He has full control of his lower extremities, bladder function and bowel function. He has not reported any new neurological complaints. Denied seizures or decline in his exercise tolerance. He has not reported any lymphadenopathy or pruritus. He has not reported any petechiae or bleeding. He reports that he had a bone scan ordered by Dr. Diona [name]. [name] states that Dr. Diona [name] recommended that he take something for his bones while he is getting his chemotherapy based on the results of the bone scan. Remainder of his review of systems unremarkable.     Medications: I have reviewed the patient's current medications. Current Outpatient Prescriptions  Medication Sig Dispense Refill  . amLODipine (NORVASC) 5 MG tablet Take 5 mg by mouth every evening.     [name] aspirin 81 MG tablet Take 81 mg by mouth daily.      . calcium carbonate (OS-CAL) 1250 MG chewable tablet Chew 2 tablets by mouth daily.    [name] HYDROcodone-acetaminophen (NORCO) 5-325 MG per tablet Take 1-2 tablets by mouth every 4 (four) hours as needed for moderate pain. 30 tablet 0  . leuprolide (LUPRON)  30 MG injection Inject 30 mg into the muscle every 4 (four) months. LAST INJECTION JUNE 2015    . lidocaine-prilocaine (EMLA) cream Apply topically as needed. 1 g 2  . ondansetron (ZOFRAN) 8 MG tablet Take 8 mg by mouth every 8 (eight) hours as needed for nausea or vomiting.    . VESICARE 5 MG tablet     . ZOSTAVAX 32440 UNT/0.65ML injection      No current facility-administered medications for this visit.     Allergies: No Known Allergies  Past Medical History, Surgical history, Social history, and Family History were reviewed and updated.    Physical Exam: Blood pressure 130/69, pulse 65, temperature 98.1 F (36.7 C), temperature source Oral, resp. rate 18, height 5\' 8"  (1.727 m), weight 233 lb 8 oz (105.915 kg), SpO2 100 %. ECOG: 1 General appearance: alert awake appeared in no active distress. Head: Normocephalic, without obvious abnormality, atraumatic Neck: no adenopathy Lymph nodes: Cervical, supraclavicular, and axillary nodes normal. Heart:regular rate and rhythm, S1, S2 normal, no murmur, click, rub or gallop Lung:chest clear, no wheezing, rales, normal symmetric air entry. Chest wall examination: Right anterior chest porta cath, well healed. Abdomen: soft, non-tender, without masses or organomegaly EXT:1+ edema which is unchanged.   Lab Results: Lab Results  Component Value Date   WBC 6.4 11/07/2014   HGB 9.8* 11/07/2014   HCT 32.2* 11/07/2014   MCV 85.0 11/07/2014   PLT 212 11/07/2014     Chemistry      Component Value Date/Time   NA 143 10/16/2014 0936   NA 138 06/24/2014 0729   K 4.2 10/16/2014 0936   K 3.9 06/24/2014 0729   CL 104 02/06/2014 1005   CL 108* 03/09/2013 0923   CO2 29 10/16/2014 0936   CO2 26 02/06/2014 1005   BUN 13.8 10/16/2014 0936   BUN 12 02/06/2014 1005   CREATININE 0.9 10/16/2014 0936   CREATININE 0.89 02/06/2014 1005      Component Value Date/Time   CALCIUM 9.2 10/16/2014 0936   CALCIUM 9.4 02/06/2014 1005   ALKPHOS 73 10/16/2014 0936   ALKPHOS 71 12/27/2012 0415   AST 12 10/16/2014 0936   AST 17 12/27/2012 0415   ALT 9 10/16/2014 0936   ALT 24 12/27/2012 0415   BILITOT 0.33 10/16/2014 0936   BILITOT 0.3 12/27/2012 0415       Results for [name] (MRN [id number]) as of 10/17/2014 09:56  Ref. Range 09/03/2014 09:13 09/25/2014 09:48 10/16/2014 09:37  PSA Latest Range: <=4.00 ng/mL 739.30 (H) 655.80 (H) 728.60 (H)    RADIOLOGY STUDIES:  Nm Bone Scan Whole Body  11/01/2014   CLINICAL DATA:  Prostate cancer.  EXAM: NUCLEAR MEDICINE WHOLE BODY BONE SCAN  TECHNIQUE: Whole body anterior and posterior images were obtained approximately 3 hours after intravenous injection of radiopharmaceutical.  RADIOPHARMACEUTICALS:  26.3 mCi Technetium-99 MDP  COMPARISON:  CT 06/04/2014.  Bone scan 09/04/2010.  FINDINGS: Bilateral renal function and excretion . Intense increased activity noted over the left frontal skull, the right posterior first rib, and right acetabulum. Metastatic disease could present in this fashion.  IMPRESSION: Prominent increased activity noted over the left frontal skull, the right posterior first rib, and right acetabulum. These findings suggest metastatic disease. Plain film studies in these regions can be obtained for further evaluation.   Electronically Signed   By: Marcello Moores  Register   On: 11/01/2014 11:43    Impression and Plan:  This is a 69 year old gentleman with the following  issues: 1. Castration-resistant prostate cancer with pelvic adenopathy. He is now on systemic chemotherapy in the form Jevtana  with Neulasta support. He has tolerated chemotherapy well at this time with reasonable response in his PSA. His PSA has dropped from around 1100 down to the 700 range. The plan is to continue the current dose and schedule and he will receive chemotherapy today and another cycle in 3 weeks. 2. IV access: Status post right anterior chest Port-A-Cath placement on 02/06/2014. Continue EMLA cream as needed. No complications at this time. 3. Hydronephrosis: He is S/P stent placement. 4. Hormonal deprivation.  He continues to be on Lupron. Last given January 7,2016 2015 and will be repeated in May 2016. 5. 3.8 cm enhancing mass in the upper pole of the left kidney likely renal cell carcinoma. We will continue  observation and will followup on at with his next CT scan. 6. Evidence of bone metastasis in the  left frontal skull, right posterior first rib and right acetabulum on recent bone scan. Discussed initiation of bone directed therapy with either Zometa or Xgeva. Patient will require written dental clearance prior to initiation of this therapy. Side effects were briefly discussed including possible osteonecrosis of the jaw and will be discussed further when the patient returns in 3 weeks. [name] will see his dentist for dental clearance regarding initiation of either Zometa or Xgeva. 7. Burning sensation in the groin area: per patient long standing issue but slightly worse recently. Has full control of lower extremities, bladder and bowels. Will monitor closely. 8. Followup will be in in 3 week for the start of cycle 9 Jevtana chemotherapy.   Patient reviewed with Dr. Alen Blew.  Awilda Metro E, PA-C  1/28/20169:17 AM

## 2014-11-07 NOTE — Patient Instructions (Signed)
[facility] Discharge Instructions for Patients Receiving Chemotherapy  Today you received the following chemotherapy agents Jevtana To help prevent nausea and vomiting after your treatment, we encourage you to take your nausea medication as prescribed.  If you develop nausea and vomiting that is not controlled by your nausea medication, call the clinic.   BELOW ARE SYMPTOMS THAT SHOULD BE REPORTED IMMEDIATELY:  *FEVER GREATER THAN 100.5 F  *CHILLS WITH OR WITHOUT FEVER  NAUSEA AND VOMITING THAT IS NOT CONTROLLED WITH YOUR NAUSEA MEDICATION  *UNUSUAL SHORTNESS OF BREATH  *UNUSUAL BRUISING OR BLEEDING  TENDERNESS IN MOUTH AND THROAT WITH OR WITHOUT PRESENCE OF ULCERS  *URINARY PROBLEMS  *BOWEL PROBLEMS  UNUSUAL RASH Items with * indicate a potential emergency and should be followed up as soon as possible.  Feel free to call the clinic you have any questions or concerns. The clinic phone number is [phone].

## 2014-11-07 NOTE — Patient Instructions (Signed)

## 2014-11-08 ENCOUNTER — Ambulatory Visit (HOSPITAL_BASED_OUTPATIENT_CLINIC_OR_DEPARTMENT_OTHER): Payer: PRIVATE HEALTH INSURANCE

## 2014-11-08 DIAGNOSIS — C61 Malignant neoplasm of prostate: Secondary | ICD-10-CM

## 2014-11-08 DIAGNOSIS — Z5189 Encounter for other specified aftercare: Secondary | ICD-10-CM

## 2014-11-08 LAB — PSA: PSA: 681.3 ng/mL — ABNORMAL HIGH (ref <=4.00)

## 2014-11-08 MED ORDER — PEGFILGRASTIM INJECTION 6 MG/0.6ML ~~LOC~~
6.0000 mg | PREFILLED_SYRINGE | Freq: Once | SUBCUTANEOUS | Status: AC
  Administered 2014-11-08: 6 mg via SUBCUTANEOUS
  Filled 2014-11-08: qty 0.6

## 2014-11-19 ENCOUNTER — Encounter (HOSPITAL_BASED_OUTPATIENT_CLINIC_OR_DEPARTMENT_OTHER): Payer: Self-pay | Admitting: *Deleted

## 2014-11-19 NOTE — Progress Notes (Signed)
NPO AFTER MN. ARRIVE AT 1015. CURRENT LAB RESULTS AND EKG IN CHART AND EPIC. MAY TAKE ZOFRAN AM DOS W/ SIPS OF WATER IF NEEDED.

## 2014-11-22 ENCOUNTER — Ambulatory Visit (HOSPITAL_BASED_OUTPATIENT_CLINIC_OR_DEPARTMENT_OTHER)
Admission: RE | Admit: 2014-11-22 | Discharge: 2014-11-22 | Disposition: A | Discharge status: Home / Self Care | Payer: Medicare Other | Source: Ambulatory Visit | Attending: Urology | Admitting: Urology

## 2014-11-22 ENCOUNTER — Encounter (HOSPITAL_BASED_OUTPATIENT_CLINIC_OR_DEPARTMENT_OTHER)
Admission: RE | Disposition: A | Discharge status: Home / Self Care | Payer: Self-pay | Source: Ambulatory Visit | Attending: Urology

## 2014-11-22 ENCOUNTER — Ambulatory Visit (HOSPITAL_BASED_OUTPATIENT_CLINIC_OR_DEPARTMENT_OTHER): Payer: Medicare Other | Admitting: Anesthesiology

## 2014-11-22 ENCOUNTER — Encounter (HOSPITAL_BASED_OUTPATIENT_CLINIC_OR_DEPARTMENT_OTHER): Payer: Self-pay | Admitting: *Deleted

## 2014-11-22 DIAGNOSIS — C61 Malignant neoplasm of prostate: Secondary | ICD-10-CM

## 2014-11-22 DIAGNOSIS — Z9221 Personal history of antineoplastic chemotherapy: Secondary | ICD-10-CM | POA: Insufficient documentation

## 2014-11-22 DIAGNOSIS — Z96653 Presence of artificial knee joint, bilateral: Secondary | ICD-10-CM | POA: Insufficient documentation

## 2014-11-22 DIAGNOSIS — R591 Generalized enlarged lymph nodes: Secondary | ICD-10-CM | POA: Insufficient documentation

## 2014-11-22 DIAGNOSIS — I10 Essential (primary) hypertension: Secondary | ICD-10-CM | POA: Diagnosis not present

## 2014-11-22 DIAGNOSIS — M1990 Unspecified osteoarthritis, unspecified site: Secondary | ICD-10-CM | POA: Diagnosis not present

## 2014-11-22 DIAGNOSIS — Z7982 Long term (current) use of aspirin: Secondary | ICD-10-CM | POA: Insufficient documentation

## 2014-11-22 DIAGNOSIS — G4733 Obstructive sleep apnea (adult) (pediatric): Secondary | ICD-10-CM | POA: Diagnosis not present

## 2014-11-22 DIAGNOSIS — N1330 Unspecified hydronephrosis: Secondary | ICD-10-CM | POA: Diagnosis not present

## 2014-11-22 DIAGNOSIS — C7951 Secondary malignant neoplasm of bone: Secondary | ICD-10-CM | POA: Insufficient documentation

## 2014-11-22 DIAGNOSIS — Z86711 Personal history of pulmonary embolism: Secondary | ICD-10-CM | POA: Insufficient documentation

## 2014-11-22 HISTORY — PX: CYSTOSCOPY W/ URETERAL STENT PLACEMENT: SHX1429

## 2014-11-22 HISTORY — DX: Secondary malignant neoplasm of bone: C79.51

## 2014-11-22 SURGERY — CYSTOSCOPY, FLEXIBLE, WITH STENT REPLACEMENT
Anesthesia: General | Site: Ureter | Laterality: Right

## 2014-11-22 MED ORDER — LIDOCAINE HCL (CARDIAC) 20 MG/ML IV SOLN
INTRAVENOUS | Status: DC | PRN
  Administered 2014-11-22: 50 mg via INTRAVENOUS

## 2014-11-22 MED ORDER — SODIUM CHLORIDE 0.9 % IR SOLN
Status: DC | PRN
  Administered 2014-11-22: 1000 mL

## 2014-11-22 MED ORDER — CEFAZOLIN SODIUM 1-5 GM-% IV SOLN
1.0000 g | INTRAVENOUS | Status: DC
  Filled 2014-11-22: qty 50

## 2014-11-22 MED ORDER — DEXAMETHASONE SODIUM PHOSPHATE 4 MG/ML IJ SOLN
INTRAMUSCULAR | Status: DC | PRN
  Administered 2014-11-22: 8 mg via INTRAVENOUS

## 2014-11-22 MED ORDER — CEFAZOLIN SODIUM-DEXTROSE 2-3 GM-% IV SOLR
2.0000 g | INTRAVENOUS | Status: AC
  Administered 2014-11-22: 2 g via INTRAVENOUS
  Filled 2014-11-22: qty 50

## 2014-11-22 MED ORDER — LACTATED RINGERS IV SOLN
INTRAVENOUS | Status: DC
  Administered 2014-11-22: 10:00:00 via INTRAVENOUS
  Filled 2014-11-22: qty 1000

## 2014-11-22 MED ORDER — STERILE WATER FOR IRRIGATION IR SOLN
Status: DC | PRN
  Administered 2014-11-22: 500 mL

## 2014-11-22 MED ORDER — ACETAMINOPHEN 10 MG/ML IV SOLN
INTRAVENOUS | Status: DC | PRN
  Administered 2014-11-22: 1000 mg via INTRAVENOUS

## 2014-11-22 MED ORDER — MIDAZOLAM HCL 2 MG/2ML IJ SOLN
INTRAMUSCULAR | Status: AC
  Filled 2014-11-22: qty 2

## 2014-11-22 MED ORDER — PROPOFOL 10 MG/ML IV BOLUS
INTRAVENOUS | Status: DC | PRN
  Administered 2014-11-22: 150 mg via INTRAVENOUS

## 2014-11-22 MED ORDER — CEFAZOLIN SODIUM-DEXTROSE 2-3 GM-% IV SOLR
INTRAVENOUS | Status: AC
  Filled 2014-11-22: qty 50

## 2014-11-22 MED ORDER — ONDANSETRON HCL 4 MG/2ML IJ SOLN
INTRAMUSCULAR | Status: DC | PRN
  Administered 2014-11-22: 4 mg via INTRAVENOUS

## 2014-11-22 MED ORDER — FENTANYL CITRATE 0.05 MG/ML IJ SOLN
INTRAMUSCULAR | Status: AC
  Filled 2014-11-22: qty 4

## 2014-11-22 MED ORDER — MIDAZOLAM HCL 5 MG/5ML IJ SOLN
INTRAMUSCULAR | Status: DC | PRN
  Administered 2014-11-22: 2 mg via INTRAVENOUS

## 2014-11-22 MED ORDER — FENTANYL CITRATE 0.05 MG/ML IJ SOLN
INTRAMUSCULAR | Status: DC | PRN
  Administered 2014-11-22: 25 ug via INTRAVENOUS
  Administered 2014-11-22: 50 ug via INTRAVENOUS
  Administered 2014-11-22: 25 ug via INTRAVENOUS

## 2014-11-22 SURGICAL SUPPLY — 22 items
ADAPTER CATH URET PLST 4-6FR (CATHETERS) IMPLANT
BAG DRAIN URO-CYSTO SKYTR STRL (DRAIN) ×3 IMPLANT
CANISTER SUCT LVC 12 LTR MEDI- (MISCELLANEOUS) ×3 IMPLANT
CATH INTERMIT  6FR 70CM (CATHETERS) IMPLANT
CLOTH BEACON ORANGE TIMEOUT ST (SAFETY) ×3 IMPLANT
DRAPE CAMERA CLOSED 9X96 (DRAPES) ×3 IMPLANT
GLOVE BIO SURGEON STRL SZ 6.5 (GLOVE) ×4 IMPLANT
GLOVE BIO SURGEON STRL SZ8 (GLOVE) ×3 IMPLANT
GLOVE BIO SURGEONS STRL SZ 6.5 (GLOVE) ×2
GLOVE INDICATOR 6.5 STRL GRN (GLOVE) ×6 IMPLANT
GOWN PREVENTION PLUS LG XLONG (DISPOSABLE) IMPLANT
GOWN STRL REIN XL XLG (GOWN DISPOSABLE) IMPLANT
GOWN STRL REUS W/ TWL LRG LVL3 (GOWN DISPOSABLE) ×1 IMPLANT
GOWN STRL REUS W/ TWL XL LVL3 (GOWN DISPOSABLE) ×1 IMPLANT
GOWN STRL REUS W/TWL LRG LVL3 (GOWN DISPOSABLE) ×2 IMPLANT
GOWN STRL REUS W/TWL XL LVL3 (GOWN DISPOSABLE) ×2 IMPLANT
GUIDEWIRE 0.038 PTFE COATED (WIRE) IMPLANT
GUIDEWIRE ANG ZIPWIRE 038X150 (WIRE) IMPLANT
GUIDEWIRE STR DUAL SENSOR (WIRE) ×3 IMPLANT
NS IRRIG 500ML POUR BTL (IV SOLUTION) IMPLANT
PACK CYSTO (CUSTOM PROCEDURE TRAY) ×3 IMPLANT
STENT CONTOUR 8FR X 24 (STENTS) ×3 IMPLANT

## 2014-11-22 NOTE — H&P (Signed)
Urology History and Physical Exam  CC: right kidney blockage  HPI: This 69 year old male comes in today for cystoscopy and right double-J stent exchange.  He hasright-sided hydronephrosis secondary to retroperitoneal adenopathy, which is secondary to progressive/metastatic adenocarcinoma of the prostate. He is followed on a regular basis by Dr. Alen Blew at the [facility]. He is receiving ADT, and is now on Jevtana infusions at the regional [facility]. He is on those every 3 weeks. His PSA went up to 1100 , but is now down to the 7 or 800 level. He is having no bony pain. He is having some urgency and frequency. He is not having gross hematuria or dysuria.     He was admitted to the hospital in March, 2014 at [facility] for pyelonephritis and new onset hydronephrosis. Percutaneous nephrostomy tube was placed, the patient defervesced, but spent about a week in the hospital. He had his double-J stent internalized, and has been doing well with this.     His right double-J stent was last changed on 06/24/2014. This was difficult due to encrustations. It was recommended that he have it changed in 6 months rather than 8 months.  Recent bone scan revealed 3 foci of osseous metastatic disease-the right acetabulum, a rib and his calvarium.  He is asymptomatic from these.   PMH: Past Medical History  Diagnosis Date  . Hypertension   . Osteoarthritis   . Retroperitoneal lymphadenopathy   . History of pulmonary embolus (PE)     dec 2009  . History of DVT (deep vein thrombosis)     dec 2009  . OSA on CPAP   . History of acute renal failure     W/ SEPSIS  MARCH 2014  . Urgency of urination   . ED (erectile dysfunction)   . Left renal mass     MONITORED BY UROLOGIST-  DR Diona Fanti  . Hydronephrosis, right   . Prostate cancer ONCOLOGIST-  DR Alen Blew    DX 2001 (T3 N1)-- S/P PROSTATECTOMY--/ BIOCHEMICAL RELAPSE, RECEIVED PROSTATIC BED RADIATION AND LUPRON WITH CASODEX   SINCE DEVELOPED   CASTATION RESISITENT METASTATIC PROSTATE ADENOCARINOMA--  CURRENT THERAPY CHEMOTHEAPY EVERY 3 WEEKS AND LUPRON INJECTIONS EVERY 4 MONTHS  . Fatigue   . Chemotherapy-induced nausea   . Metastasis to bone     PRIMARY PROSTATE CANCER    PSH: Past Surgical History  Procedure Laterality Date  . Total knee arthroplasty Bilateral RIGHT  04-24-2009/   LEFT  2005  . Insertion ivc filter and removal  03-25-2009/   REMOVAL 05-29-2009  . Prostatectomy  MAY 2001  . Transthoracic echocardiogram  12-27-2012    MILD LVH/  EF 23%/  GRADE I DIASTOLIC DYSFUNCTION  . Inguinal hernia repair Right 2010  . Penile prosthesis implant  2005  . Cystoscopy w/ ureteral stent placement Right 10/22/2013    Procedure: CYSTOSCOPY WITH STENT REPLACEMENT;  Surgeon: Franchot Gallo, MD;  Location: [facility];  Service: Urology;  Laterality: Right;  . Portacath placement  02-06-2014  . Colonoscopy w/ polypectomy  05-23-2013  . Cystoscopy w/ ureteral stent placement Right 06/24/2014    Procedure: CYSTOSCOPY WITH RETROGRADE PYELOGRAM/ URETERAL STENT REPLACEMENT WITH URETERAL DILATION, RIGHT;  Surgeon: Jorja Loa, MD;  Location: [facility];  Service: Urology;  Laterality: Right;    Allergies: No Known Allergies  Medications: No prescriptions prior to admission     Social History: History   Social History  . Marital Status: Married    Spouse  Name: N/A  . Number of Children: N/A  . Years of Education: N/A   Occupational History  . Not on file.   Social History Main Topics  . Smoking status: Never Smoker   . Smokeless tobacco: Never Used  . Alcohol Use: 3.6 oz/week    6 Cans of beer per week  . Drug Use: No  . Sexual Activity: Not on file   Other Topics Concern  . Not on file   Social History Narrative    Family History: Family History  Problem Relation Age of Onset  . Colon cancer Neg Hx   . Heart disease Mother     Review of Systems: Genitourinary:  feelings of urinary urgency and nocturia, but no dysuria and no hematuria. Monica Negative:  A further 10 point review of systems was negative except what is listed in the HPI.                  Physical Exam: @VITALS2 @ General: No acute distress.  Awake. Head:  Normocephalic.  Atraumatic. ENT:  EOMI.  Mucous membranes moist Neck:  Supple.  No lymphadenopathy. CV:  S1 present. S2 present. Regular rate. Pulmonary: Equal effort bilaterally.  Clear to auscultation bilaterally. Abdomen: Soft.  Non- tender to palpation. Skin:  Normal turgor.  No visible rash. Extremity: No gross deformity of bilateral upper extremities.  No gross deformity of                             lower extremities. Neurologic: Alert. Appropriate mood.    Studies:  No results for input(s): HGB, WBC, PLT in the last 72 hours.  No results for input(s): NA, K, CL, CO2, BUN, CREATININE, CALCIUM, GFRNONAA, GFRAA in the last 72 hours.  Invalid input(s): MAGNESIUM   No results for input(s): INR, APTT in the last 72 hours.  Invalid input(s): PT   Invalid input(s): ABG    Assessment:   1. Right hydronephrosis secondary to retroperitoneal adenopathy, initially with associated pyelonephritis. He has had drainage, initially with a percutaneous tube, now with an indwelling stent. We need to changes in the near future    2. Progressive, castrate resistant adenocarcinoma of prostate, followed by Dr. Alen Blew. He is on androgen deprivation with Lupron. He has progressive disease.His PSA is now approximately 800. He is on Jevtana    3. Left upper pole renal mass, 3.4 cm in size. We will follow this, as his prognosis from his metastatic, castrate resistant prostate cancer at this point is not that great, and I do not think This needs management.   Plan: Cystoscopy, right double-J stent exchange

## 2014-11-22 NOTE — Anesthesia Procedure Notes (Signed)
Procedure Name: LMA Insertion Date/Time: 11/22/2014 12:01 PM Performed by: Mechele Claude Pre-anesthesia Checklist: Patient identified, Emergency Drugs available, Suction available and Patient being monitored Patient Re-evaluated:Patient Re-evaluated prior to inductionOxygen Delivery Method: Circle System Utilized Preoxygenation: Pre-oxygenation with 100% oxygen Intubation Type: IV induction Ventilation: Mask ventilation without difficulty LMA: LMA inserted LMA Size: 5.0 Number of attempts: 1 Airway Equipment and Method: bite block Placement Confirmation: positive ETCO2 Tube secured with: Tape Dental Injury: Teeth and Oropharynx as per pre-operative assessment

## 2014-11-22 NOTE — Transfer of Care (Signed)
  Last Vitals:  Filed Vitals:   11/22/14 1037  BP: 110/62  Pulse: 67  Temp: 37.4 C  Resp: 18    Immediate Anesthesia Transfer of Care Note  Patient: [name].  Procedure(s) Performed: Procedure(s) (LRB): CYSTOSCOPY WITH STENT REPLACEMENT (Right)  Patient Location: PACU  Anesthesia Type: General  Level of Consciousness: awake, alert  and oriented  Airway & Oxygen Therapy: Patient Spontanous Breathing and Patient connected to face mask oxygen  Post-op Assessment: Report given to PACU RN and Post -op Vital signs reviewed and stable  Post vital signs: Reviewed and stable  Complications: No apparent anesthesia complications

## 2014-11-22 NOTE — Op Note (Signed)
Preoperative diagnosis: Right hydronephrosis secondary to lymphadenopathy  Postoperative diagnosis: Same  Principal procedure: Cystoscopy, right double-J stent extraction, replacement of 8 French by 24 cm contour stent without string  Surgeon: Desteny Freeman  Anesthesia: Gen. LMA  Complications: None  Drains: Above-mentioned stent  Indications: 69 year old male with progressive metastatic prostate cancer. He is on chemotherapy as well as androgen deprivation therapy. He has retroperitoneal lymphadenopathy which is caused right-sided hydronephrosis. He has had the need for stent drainage. He presents at this time for stent exchange.  Procedure: The patient was properly identified and marked in the holding area. He received preoperative IV Ancef. He was taken the operating room where general anesthetic was administered with the LMA. He was placed in the dorsolithotomy position. Genitalia and perineum were prepped and draped. Proper timeout was performed.  I deflated the patient's penile prosthesis. A 22 French panendoscope was advanced through the patient's urethra. There was a false passage in the old his urethra. He had a high riding bladder neck. Prostatic urethra was absent from prior surgery. There was no bladder neck contracture. The bladder was inspected circumferentially. No tumors trabeculations or foreign bodies were noted. The right side double-J stent was extracted. I then replaced the scope, and using a sensor-tip guidewire, negotiated a 24 cm x 8 French contour double-J stent over the guidewire, positioning it using fluoroscopy and cystoscopy. Guidewire was removed and excellent proximal and distal curls were seen. The bladder was drained. The scope was removed and the procedure terminated. The patient was awakened and taken to the PACU in stable condition.

## 2014-11-22 NOTE — Anesthesia Postprocedure Evaluation (Signed)
Anesthesia Post Note  Patient: [name].  Procedure(s) Performed: Procedure(s) (LRB): CYSTOSCOPY WITH STENT REPLACEMENT (Right)  Anesthesia type: general  Patient location: PACU  Post pain: Pain level controlled  Post assessment: Patient's Cardiovascular Status Stable  Last Vitals:  Filed Vitals:   11/22/14 1300  BP: 119/76  Pulse: 76  Temp:   Resp: 18    Post vital signs: Reviewed and stable  Level of consciousness: sedated  Complications: No apparent anesthesia complications

## 2014-11-22 NOTE — Anesthesia Preprocedure Evaluation (Signed)
Anesthesia Evaluation  Patient identified by MRN, date of birth, ID band Patient awake    Reviewed: Allergy & Precautions, H&P , NPO status , Patient's Chart, lab work & pertinent test results  Airway Mallampati: III  TM Distance: >3 FB Neck ROM: full    Dental  (+) Caps, Dental Advisory Given,  Upper front 3 teeth are capped:   Pulmonary sleep apnea and Continuous Positive Airway Pressure Ventilation , PE breath sounds clear to auscultation  Pulmonary exam normal       Cardiovascular hypertension, Pt. on medications Rhythm:regular Rate:Normal     Neuro/Psych negative neurological ROS  negative psych ROS   GI/Hepatic negative GI ROS, Neg liver ROS,   Endo/Other  negative endocrine ROS  Renal/GU negative Renal ROSHistory acute renal failure with sepsis last year  negative genitourinary   Musculoskeletal  (+) Arthritis -, Osteoarthritis,    Abdominal   Peds  Hematology negative hematology ROS (+)   Anesthesia Other Findings   Reproductive/Obstetrics negative OB ROS                             Anesthesia Physical  Anesthesia Plan  ASA: III  Anesthesia Plan: General   Post-op Pain Management:    Induction: Intravenous  Airway Management Planned: LMA  Additional Equipment:   Intra-op Plan:   Post-operative Plan:   Informed Consent: I have reviewed the patients History and Physical, chart, labs and discussed the procedure including the risks, benefits and alternatives for the proposed anesthesia with the patient or authorized representative who has indicated his/her understanding and acceptance.   Dental Advisory Given  Plan Discussed with: CRNA and Surgeon  Anesthesia Plan Comments:         Anesthesia Quick Evaluation

## 2014-11-22 NOTE — Discharge Instructions (Signed)
1. You may see some blood in the urine and may have some burning with urination for 48-72 hours. You also may notice that you have to urinate more frequently or urgently after your procedure which is normal.  2. You should call should you develop an inability urinate, fever > 101, persistent nausea and vomiting that prevents you from eating or drinking to stay hydrated.  3. If you have a stent, you will likely urinate more frequently and urgently until the stent is removed and you may experience some discomfort/pain in the lower abdomen and flank especially when urinating. You may take pain medication prescribed to you if needed for pain. You may also intermittently have blood in the urine until the stent is removed. 4. If you have a catheter, you will be taught how to take care of the catheter by the nursing staff prior to discharge from the hospital.  You may periodically feel a strong urge to void with the catheter in place.  This is a bladder spasm and most often can occur when having a bowel movement or moving around. It is typically self-limited and usually will stop after a few minutes.  You may use some Vaseline or Neosporin around the tip of the catheter to reduce friction at the tip of the penis. You may also see some blood in the urine.  A very small amount of blood can make the urine look quite red.  As long as the catheter is draining well, there usually is not a problem.  However, if the catheter is not draining well and is bloody, you should call the office [phone]) to notify us.   CYSTOSCOPY HOME CARE INSTRUCTIONS  Activity: Rest for the remainder of the day.  Do not drive or operate equipment today.  You may resume normal activities in one to two days as instructed by your physician.   Meals: Drink plenty of liquids and eat light foods such as gelatin or soup this evening.  You may return to a normal meal plan tomorrow.  Return to Work: You may return to work in one to two days  or as instructed by your physician.  Special Instructions / Symptoms: Call your physician if any of these symptoms occur:   -persistent or heavy bleeding  -bleeding which continues after first few urination  -large blood clots that are difficult to pass  -urine stream diminishes or stops completely  -fever equal to or higher than 101 degrees Farenheit.  -cloudy urine with a strong, foul odor  -severe pain  You may feel some burning pain when you urinate.  This should disappear with time.  Applying moist heat to the lower abdomen or a hot tub bath may help relieve the pain.      Post Anesthesia Home Care Instructions  Activity: Get plenty of rest for the remainder of the day. A responsible adult should stay with you for 24 hours following the procedure.  For the next 24 hours, DO NOT: -Drive a car -Paediatric nurse -Drink alcoholic beverages -Take any medication unless instructed by your physician -Make any legal decisions or sign important papers.  Meals: Start with liquid foods such as gelatin or soup. Progress to regular foods as tolerated. Avoid greasy, spicy, heavy foods. If nausea and/or vomiting occur, drink only clear liquids until the nausea and/or vomiting subsides. Call your physician if vomiting continues.  Special Instructions/Symptoms: Your throat may feel dry or sore from the anesthesia or the breathing tube placed in your throat  surgery. If this causes discomfort, gargle with warm salt water. The discomfort should disappear within 24 hours. ° ° °

## 2014-11-23 NOTE — Anesthesia Postprocedure Evaluation (Signed)
  Anesthesia Post-op Note  Patient: [name].  Procedure(s) Performed: Procedure(s) (LRB): CYSTOSCOPY WITH STENT REPLACEMENT (Right)  Patient Location: PACU  Anesthesia Type: General  Level of Consciousness: awake and alert   Airway and Oxygen Therapy: Patient Spontanous Breathing  Post-op Pain: mild  Post-op Assessment: Post-op Vital signs reviewed, Patient's Cardiovascular Status Stable, Respiratory Function Stable, Patent Airway and No signs of Nausea or vomiting  Last Vitals:  Filed Vitals:   11/22/14 1300  BP: 119/76  Pulse: 76  Temp:   Resp: 18    Post-op Vital Signs: stable   Complications: No apparent anesthesia complications

## 2014-11-25 ENCOUNTER — Encounter (HOSPITAL_BASED_OUTPATIENT_CLINIC_OR_DEPARTMENT_OTHER): Payer: Self-pay | Admitting: Urology

## 2014-11-27 ENCOUNTER — Other Ambulatory Visit (HOSPITAL_BASED_OUTPATIENT_CLINIC_OR_DEPARTMENT_OTHER): Payer: Medicare Other

## 2014-11-27 DIAGNOSIS — N2889 Other specified disorders of kidney and ureter: Secondary | ICD-10-CM

## 2014-11-27 DIAGNOSIS — C61 Malignant neoplasm of prostate: Secondary | ICD-10-CM

## 2014-11-27 LAB — COMPREHENSIVE METABOLIC PANEL (CC13)
ALT: 7 U/L (ref 0–55)
ANION GAP: 8 meq/L (ref 3–11)
AST: 10 U/L (ref 5–34)
Albumin: 3.7 g/dL (ref 3.5–5.0)
Alkaline Phosphatase: 65 U/L (ref 40–150)
BILIRUBIN TOTAL: 0.32 mg/dL (ref 0.20–1.20)
BUN: 14.5 mg/dL (ref 7.0–26.0)
CO2: 29 meq/L (ref 22–29)
CREATININE: 0.9 mg/dL (ref 0.7–1.3)
Calcium: 9.1 mg/dL (ref 8.4–10.4)
Chloride: 107 mEq/L (ref 98–109)
EGFR: >90 mL/min/{1.73_m2} (ref >90)
GLUCOSE: 117 mg/dL (ref 70–140)
Potassium: 4.3 mEq/L (ref 3.5–5.1)
Sodium: 144 mEq/L (ref 136–145)
TOTAL PROTEIN: 6.4 g/dL (ref 6.4–8.3)

## 2014-11-27 LAB — CBC WITH DIFFERENTIAL/PLATELET
BASO%: 0.6 % (ref 0.0–2.0)
Basophils Absolute: 0.1 10*3/uL (ref 0.0–0.1)
EOS%: 1.8 % (ref 0.0–7.0)
Eosinophils Absolute: 0.2 10*3/uL (ref 0.0–0.5)
HEMATOCRIT: 33.6 % — AB (ref 38.4–49.9)
HEMOGLOBIN: 10.3 g/dL — AB (ref 13.0–17.1)
LYMPH%: 18 % (ref 14.0–49.0)
MCH: 26.1 pg — AB (ref 27.2–33.4)
MCHC: 30.7 g/dL — AB (ref 32.0–36.0)
MCV: 85.3 fL (ref 79.3–98.0)
MONO#: 0.8 10*3/uL (ref 0.1–0.9)
MONO%: 9.5 % (ref 0.0–14.0)
NEUT#: 5.9 10*3/uL (ref 1.5–6.5)
NEUT%: 70.1 % (ref 39.0–75.0)
Platelets: 185 10*3/uL (ref 140–400)
RBC: 3.94 10*6/uL — AB (ref 4.20–5.82)
RDW: 19.1 % — ABNORMAL HIGH (ref 11.0–14.6)
WBC: 8.5 10*3/uL (ref 4.0–10.3)
lymph#: 1.5 10*3/uL (ref 0.9–3.3)

## 2014-11-28 ENCOUNTER — Ambulatory Visit (HOSPITAL_BASED_OUTPATIENT_CLINIC_OR_DEPARTMENT_OTHER): Payer: Medicare Other | Admitting: Oncology

## 2014-11-28 ENCOUNTER — Telehealth: Payer: Self-pay | Admitting: Oncology

## 2014-11-28 ENCOUNTER — Other Ambulatory Visit: Payer: Self-pay | Admitting: Oncology

## 2014-11-28 ENCOUNTER — Ambulatory Visit (HOSPITAL_BASED_OUTPATIENT_CLINIC_OR_DEPARTMENT_OTHER): Payer: Medicare Other

## 2014-11-28 VITALS — BP 130/69 | HR 82 | Temp 98.0°F | Resp 20 | Ht 68.0 in | Wt 230.6 lb

## 2014-11-28 DIAGNOSIS — C61 Malignant neoplasm of prostate: Secondary | ICD-10-CM

## 2014-11-28 DIAGNOSIS — N1330 Unspecified hydronephrosis: Secondary | ICD-10-CM

## 2014-11-28 DIAGNOSIS — C7951 Secondary malignant neoplasm of bone: Secondary | ICD-10-CM

## 2014-11-28 DIAGNOSIS — Z5111 Encounter for antineoplastic chemotherapy: Secondary | ICD-10-CM

## 2014-11-28 DIAGNOSIS — E291 Testicular hypofunction: Secondary | ICD-10-CM

## 2014-11-28 LAB — PSA: PSA: 832.8 ng/mL — ABNORMAL HIGH (ref <=4.00)

## 2014-11-28 MED ORDER — DIPHENHYDRAMINE HCL 50 MG/ML IJ SOLN
INTRAMUSCULAR | Status: AC
Start: 2014-11-28 — End: 2014-11-28
  Filled 2014-11-28: qty 1

## 2014-11-28 MED ORDER — HEPARIN SOD (PORK) LOCK FLUSH 100 UNIT/ML IV SOLN
500.0000 [IU] | Freq: Once | INTRAVENOUS | Status: AC | PRN
  Administered 2014-11-28: 500 [IU]
  Filled 2014-11-28: qty 5

## 2014-11-28 MED ORDER — DIPHENHYDRAMINE HCL 50 MG/ML IJ SOLN
25.0000 mg | Freq: Once | INTRAMUSCULAR | Status: AC
  Administered 2014-11-28: 25 mg via INTRAVENOUS

## 2014-11-28 MED ORDER — ONDANSETRON 8 MG/50ML IVPB (CHCC)
8.0000 mg | Freq: Once | INTRAVENOUS | Status: AC
  Administered 2014-11-28: 8 mg via INTRAVENOUS

## 2014-11-28 MED ORDER — ONDANSETRON 8 MG/NS 50 ML IVPB
INTRAVENOUS | Status: AC
  Filled 2014-11-28: qty 8

## 2014-11-28 MED ORDER — FAMOTIDINE IN NACL 20-0.9 MG/50ML-% IV SOLN
20.0000 mg | Freq: Once | INTRAVENOUS | Status: AC
  Administered 2014-11-28: 20 mg via INTRAVENOUS

## 2014-11-28 MED ORDER — SODIUM CHLORIDE 0.9 % IV SOLN
Freq: Once | INTRAVENOUS | Status: AC
  Administered 2014-11-28: 10:00:00 via INTRAVENOUS

## 2014-11-28 MED ORDER — DEXTROSE 5 % IV SOLN: 25.0000 mg/m2 | Freq: Once | INTRAVENOUS | Status: DC

## 2014-11-28 MED ORDER — CABAZITAXEL CHEMO INJECTION 60 MG/6ML W/DILUENT
26.0000 mg/m2 | Freq: Once | INTRAVENOUS | Status: AC
  Administered 2014-11-28: 60 mg via INTRAVENOUS
  Filled 2014-11-28: qty 6

## 2014-11-28 MED ORDER — OXYCODONE HCL 5 MG PO TABS: 5.0000 mg | ORAL_TABLET | ORAL | Status: DC | PRN

## 2014-11-28 MED ORDER — DEXAMETHASONE SODIUM PHOSPHATE 20 MG/5ML IJ SOLN
12.0000 mg | Freq: Once | INTRAMUSCULAR | Status: AC
  Administered 2014-11-28: 12 mg via INTRAVENOUS

## 2014-11-28 MED ORDER — DEXAMETHASONE SODIUM PHOSPHATE 20 MG/5ML IJ SOLN
INTRAMUSCULAR | Status: AC
  Filled 2014-11-28: qty 5

## 2014-11-28 MED ORDER — FAMOTIDINE IN NACL 20-0.9 MG/50ML-% IV SOLN
INTRAVENOUS | Status: AC
  Filled 2014-11-28: qty 50

## 2014-11-28 MED ORDER — SODIUM CHLORIDE 0.9 % IJ SOLN
10.0000 mL | INTRAMUSCULAR | Status: DC | PRN
  Administered 2014-11-28: 10 mL
  Filled 2014-11-28: qty 10

## 2014-11-28 NOTE — Progress Notes (Signed)
Hematology and Oncology Follow Up Visit  [name] [id number] 09-01-1946 69 y.o. 11/28/2014 9:01 AM   [name], [name]  [name], [name]  Principle Diagnosis:This is a 69 year old gentleman with prostate cancer initially diagnosed in 2001.  He had a Gleason score of 3 + 3 = 6.  He has castration-resistant disease with pelvic adenopathy   Prior Therapy:  1. Status post prostatectomy done in May 2001.  He had T3 N1 disease.  PSA nadir to 0.  2. The patient developed biochemical relapse and received prostatic bed radiation.  3. The patient received Lupron with Casodex due to rising PSA.  The patient subsequently developed castration-resistant disease. 4. Patient treated with Casodex withdrawal and subsequently PSA rose to 18. 5. Patient with Provenge immunotherapy completed in July 2011. 6.  He received ketoconazole and prednisone December 2011 through January 2014. 7.         He is S/P Zytiga 1000 mg daily with Prednisone 5 mg daily on 11/03/12 till 07/2013. This was stopped due to progression of disease.  8.  Xtandi 1000 mg daily 07/2013 through 01/24/2014. Discontinued secondary to disease progression 9.         Docetaxel 75 mg/m2 with Neulasta support, status post 5 cycles. Discontinued 06/05/2014 secondary to disease progression.  Current therapy: 1. Systemic chemotherapy with Jevtana 25 mg/m2 given every 3 weeks. Status post 8 cycles. 2. He is on Lupron 30 mg every 4 months. Last time given on 10/17/2014  Interim History: [name] presents today for a followup visit with his wife. Since the last visit, he clearly of increased pelvic pain. His pain is predominantly suprapubic and unassociated with any urinary symptoms. He did have his urinary stents replaced but his pain is proceeded that. This created at 5-6 out of 10. He continues to tolerate Jevtana so far without difficulty.  He is not reporting any nausea.  He is not reporting any vomiting. He denies any  neuropathy or infusion related complications. He has reported loose bowel habits at times especially with increased intra-abdominal pressure with urination. He does not report any abdominal pain or vomiting. He continues to have some lower extremity edema which has not changed.     Had not reported any genitourinary complaints or bleeding. He reports some burning in his groin area projected with walking that is been present for a long period of time.  He has not reported any new neurological complaints. Denied seizures or decline in his exercise tolerance. He has not reported any lymphadenopathy or pruritus. He has not reported any petechiae or bleeding.  Remainder of his review of systems unremarkable.     Medications: I have reviewed the patient's current medications. Current Outpatient Prescriptions  Medication Sig Dispense Refill  . amLODipine (NORVASC) 5 MG tablet Take 5 mg by mouth every evening.     [name] aspirin 81 MG tablet Take 81 mg by mouth daily.      . calcium carbonate (OS-CAL) 1250 MG chewable tablet Chew 2 tablets by mouth daily.    [name] leuprolide (LUPRON) 30 MG injection Inject 30 mg into the muscle every 4 (four) months. LAST INJECTION JAN 2016    . lidocaine-prilocaine (EMLA) cream Apply topically as needed. 1 g 2  . losartan-hydrochlorothiazide (HYZAAR) 50-12.5 MG per tablet Take 1 tablet by mouth every evening.    . mirabegron ER (MYRBETRIQ) 50 MG TB24 tablet Take 50 mg by mouth every morning.    . ondansetron (ZOFRAN) 8 MG  tablet Take 8 mg by mouth every 8 (eight) hours as needed for nausea or vomiting.     No current facility-administered medications for this visit.    Allergies: No Known Allergies  Past Medical History, Surgical history, Social history, and Family History were reviewed and updated.    Physical Exam: Blood pressure 130/69, pulse 82, temperature 98 F (36.7 C), temperature source Oral, resp. rate 20, height 5\' 8"  (1.727 m), weight 230 lb 9.6 oz (104.599  kg). ECOG: 1 General appearance: alert awake appeared in no active distress. Head: Normocephalic, without obvious abnormality, atraumatic Neck: no adenopathy Lymph nodes: Cervical, supraclavicular, and axillary nodes normal. Heart:regular rate and rhythm, S1, S2 normal, no murmur, click, rub or gallop Lung:chest clear, no wheezing, rales, normal symmetric air entry. Chest wall examination: Right anterior chest porta cath, well healed. Abdomen: soft, non-tender, without masses or organomegaly EXT:1+ edema which is unchanged.   Lab Results: Lab Results  Component Value Date   WBC 8.5 11/27/2014   HGB 10.3* 11/27/2014   HCT 33.6* 11/27/2014   MCV 85.3 11/27/2014   PLT 185 11/27/2014     Chemistry      Component Value Date/Time   NA 144 11/27/2014 0921   NA 138 06/24/2014 0729   K 4.3 11/27/2014 0921   K 3.9 06/24/2014 0729   CL 104 02/06/2014 1005   CL 108* 03/09/2013 0923   CO2 29 11/27/2014 0921   CO2 26 02/06/2014 1005   BUN 14.5 11/27/2014 0921   BUN 12 02/06/2014 1005   CREATININE 0.9 11/27/2014 0921   CREATININE 0.89 02/06/2014 1005      Component Value Date/Time   CALCIUM 9.1 11/27/2014 0921   CALCIUM 9.4 02/06/2014 1005   ALKPHOS 65 11/27/2014 0921   ALKPHOS 71 12/27/2012 0415   AST 10 11/27/2014 0921   AST 17 12/27/2012 0415   ALT 7 11/27/2014 0921   ALT 24 12/27/2012 0415   BILITOT 0.32 11/27/2014 0921   BILITOT 0.3 12/27/2012 0415      Results for AARSH, FRISTOE (MRN [id number]) as of 11/28/2014 09:05  Ref. Range 09/03/2014 09:13 09/25/2014 09:48 10/16/2014 09:37 11/07/2014 08:11 11/27/2014 09:21  PSA Latest Range: <=4.00 ng/mL 739.30 (H) 655.80 (H) 728.60 (H) 681.30 (H) 832.80 (H)     RADIOLOGY STUDIES:  Nm Bone Scan Whole Body  11/01/2014   CLINICAL DATA:  Prostate cancer.  EXAM: NUCLEAR MEDICINE WHOLE BODY BONE SCAN  TECHNIQUE: Whole body anterior and posterior images were obtained approximately 3 hours after intravenous injection of  radiopharmaceutical.  RADIOPHARMACEUTICALS:  26.3 mCi Technetium-99 MDP  COMPARISON:  CT 06/04/2014.  Bone scan 09/04/2010.  FINDINGS: Bilateral renal function and excretion . Intense increased activity noted over the left frontal skull, the right posterior first rib, and right acetabulum. Metastatic disease could present in this fashion.  IMPRESSION: Prominent increased activity noted over the left frontal skull, the right posterior first rib, and right acetabulum. These findings suggest metastatic disease. Plain film studies in these regions can be obtained for further evaluation.   Electronically Signed   By: Marcello Moores  Register   On: 11/01/2014 11:43    Impression and Plan:  This is a 69 year old gentleman with the following issues: 1. Castration-resistant prostate cancer with pelvic adenopathy. He is now on systemic chemotherapy in the form Jevtana  with Neulasta support. He has tolerated chemotherapy well at this time with reasonable response in his PSA. His PSA has dropped from around 1100 down to the 700 range. His  PSA most recently was up to around 800. The plan is to continue the current dose and schedule and he will receive chemotherapy today and another cycle in 3 weeks. The plan is to restage him with a CT scan after 10 cycles of chemotherapy. This will be scheduled today. 2. IV access: Status post right anterior chest Port-A-Cath placement on 02/06/2014. Continue EMLA cream as needed. No complications at this time. 3. Hydronephrosis: He is S/P stent placement. This was replaced recently. 4. Hormonal deprivation.  He continues to be on Lupron. Last given October 17, 2014  and will be repeated in May 2016. 5. 3.8 cm enhancing mass in the upper pole of the left kidney likely renal cell carcinoma. We will continue  observation and will followup on at with his next CT scan. 6. 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  Discussed initiation of bone directed therapy with either Zometa or Xgeva. Side effects discussed including possible osteonecrosis of the jaw as well as hypokalemia. He is willing to proceed and we will start him on 12/19/2014 and he will receive it every 4-6 weeks depending on any future chemotherapy appointments. 7. Pain the pelvic area: Could be related to his malignancy I gave a prescription for Roxicodone and plan to repeat imaging studies in the near future. 8. Followup will be in in 3 week for the start of cycle 10 Jevtana chemotherapy.    [facility], MD 2/18/20169:01 AM

## 2014-11-28 NOTE — Patient Instructions (Signed)
[facility] Discharge Instructions for Patients Receiving Chemotherapy  Today you received the following chemotherapy agents: Jevtana.  To help prevent nausea and vomiting after your treatment, we encourage you to take your nausea medication: Zofran 8 mg every 8 hours as needed.   If you develop nausea and vomiting that is not controlled by your nausea medication, call the clinic.   BELOW ARE SYMPTOMS THAT SHOULD BE REPORTED IMMEDIATELY:  *FEVER GREATER THAN 100.5 F  *CHILLS WITH OR WITHOUT FEVER  NAUSEA AND VOMITING THAT IS NOT CONTROLLED WITH YOUR NAUSEA MEDICATION  *UNUSUAL SHORTNESS OF BREATH  *UNUSUAL BRUISING OR BLEEDING  TENDERNESS IN MOUTH AND THROAT WITH OR WITHOUT PRESENCE OF ULCERS  *URINARY PROBLEMS  *BOWEL PROBLEMS  UNUSUAL RASH Items with * indicate a potential emergency and should be followed up as soon as possible.  Feel free to call the clinic you have any questions or concerns. The clinic phone number is [phone].

## 2014-11-28 NOTE — Telephone Encounter (Signed)
gv adn printed appt shced and avs fo rpt for Feb adn March....sed added tx and inj.Marland KitchenMarland KitchenMarland Kitchen

## 2014-11-28 NOTE — Telephone Encounter (Signed)
Gave avs & calendar for March.Sent message to schedule treatment. Gave CT contrast.

## 2014-11-29 ENCOUNTER — Ambulatory Visit (HOSPITAL_BASED_OUTPATIENT_CLINIC_OR_DEPARTMENT_OTHER): Payer: Medicare Other

## 2014-11-29 DIAGNOSIS — C7951 Secondary malignant neoplasm of bone: Secondary | ICD-10-CM

## 2014-11-29 DIAGNOSIS — Z5189 Encounter for other specified aftercare: Secondary | ICD-10-CM

## 2014-11-29 DIAGNOSIS — C61 Malignant neoplasm of prostate: Secondary | ICD-10-CM

## 2014-11-29 MED ORDER — PEGFILGRASTIM INJECTION 6 MG/0.6ML ~~LOC~~
6.0000 mg | PREFILLED_SYRINGE | Freq: Once | SUBCUTANEOUS | Status: AC
  Administered 2014-11-29: 6 mg via SUBCUTANEOUS
  Filled 2014-11-29: qty 0.6

## 2014-11-29 NOTE — Patient Instructions (Signed)
Pegfilgrastim injection What is this medicine? PEGFILGRASTIM (peg fil GRA stim) is a long-acting granulocyte colony-stimulating factor that stimulates the growth of neutrophils, a type of white blood cell important in the body's fight against infection. It is used to reduce the incidence of fever and infection in patients with certain types of cancer who are receiving chemotherapy that affects the bone marrow. This medicine may be used for other purposes; ask your health care provider or pharmacist if you have questions. COMMON BRAND NAME(S): Neulasta What should I tell my health care provider before I take this medicine? They need to know if you have any of these conditions: -latex allergy -ongoing radiation therapy -sickle cell disease -skin reactions to acrylic adhesives (On-Body Injector only) -an unusual or allergic reaction to pegfilgrastim, filgrastim, other medicines, foods, dyes, or preservatives -pregnant or trying to get pregnant -breast-feeding How should I use this medicine? This medicine is for injection under the skin. If you get this medicine at home, you will be taught how to prepare and give the pre-filled syringe or how to use the On-body Injector. Refer to the patient Instructions for Use for detailed instructions. Use exactly as directed. Take your medicine at regular intervals. Do not take your medicine more often than directed. It is important that you put your used needles and syringes in a special sharps container. Do not put them in a trash can. If you do not have a sharps container, call your pharmacist or healthcare provider to get one. Talk to your pediatrician regarding the use of this medicine in children. Special care may be needed. Overdosage: If you think you have taken too much of this medicine contact a poison control center or emergency room at once. NOTE: This medicine is only for you. Do not share this medicine with others. What if I miss a dose? It is  important not to miss your dose. Call your doctor or health care professional if you miss your dose. If you miss a dose due to an On-body Injector failure or leakage, a new dose should be administered as soon as possible using a single prefilled syringe for manual use. What may interact with this medicine? Interactions have not been studied. Give your health care provider a list of all the medicines, herbs, non-prescription drugs, or dietary supplements you use. Also tell them if you smoke, drink alcohol, or use illegal drugs. Some items may interact with your medicine. This list may not describe all possible interactions. Give your health care provider a list of all the medicines, herbs, non-prescription drugs, or dietary supplements you use. Also tell them if you smoke, drink alcohol, or use illegal drugs. Some items may interact with your medicine. What should I watch for while using this medicine? You may need blood work done while you are taking this medicine. If you are going to need a MRI, CT scan, or other procedure, tell your doctor that you are using this medicine (On-Body Injector only). What side effects may I notice from receiving this medicine? Side effects that you should report to your doctor or health care professional as soon as possible: -allergic reactions like skin rash, itching or hives, swelling of the face, lips, or tongue -dizziness -fever -pain, redness, or irritation at site where injected -pinpoint red spots on the skin -shortness of breath or breathing problems -stomach or side pain, or pain at the shoulder -swelling -tiredness -trouble passing urine Side effects that usually do not require medical attention (report to your doctor   or health care professional if they continue or are bothersome): -bone pain -muscle pain This list may not describe all possible side effects. Call your doctor for medical advice about side effects. You may report side effects to FDA at  1-800-FDA-1088. Where should I keep my medicine? Keep out of the reach of children. Store pre-filled syringes in a refrigerator between 2 and 8 degrees C (36 and 46 degrees F). Do not freeze. Keep in carton to protect from light. Throw away this medicine if it is left out of the refrigerator for more than 48 hours. Throw away any unused medicine after the expiration date. NOTE: This sheet is a summary. It may not cover all possible information. If you have questions about this medicine, talk to your doctor, pharmacist, or health care provider.  2015, [employer]. (2013-12-27 16:14:05)  

## 2014-12-18 ENCOUNTER — Other Ambulatory Visit: Payer: Medicare Other

## 2014-12-19 ENCOUNTER — Ambulatory Visit: Payer: Medicare Other

## 2014-12-19 ENCOUNTER — Ambulatory Visit (HOSPITAL_BASED_OUTPATIENT_CLINIC_OR_DEPARTMENT_OTHER): Payer: Medicare Other | Admitting: Physician Assistant

## 2014-12-19 ENCOUNTER — Encounter: Payer: Self-pay | Admitting: Physician Assistant

## 2014-12-19 ENCOUNTER — Ambulatory Visit: Payer: Medicare Other | Admitting: Physician Assistant

## 2014-12-19 ENCOUNTER — Other Ambulatory Visit (HOSPITAL_BASED_OUTPATIENT_CLINIC_OR_DEPARTMENT_OTHER): Payer: Medicare Other

## 2014-12-19 ENCOUNTER — Ambulatory Visit (HOSPITAL_BASED_OUTPATIENT_CLINIC_OR_DEPARTMENT_OTHER): Payer: Medicare Other

## 2014-12-19 VITALS — BP 124/62 | HR 75 | Temp 98.5°F | Resp 18 | Ht 68.0 in | Wt 230.3 lb

## 2014-12-19 DIAGNOSIS — C61 Malignant neoplasm of prostate: Secondary | ICD-10-CM

## 2014-12-19 DIAGNOSIS — Z5111 Encounter for antineoplastic chemotherapy: Secondary | ICD-10-CM

## 2014-12-19 DIAGNOSIS — C7951 Secondary malignant neoplasm of bone: Secondary | ICD-10-CM | POA: Diagnosis not present

## 2014-12-19 DIAGNOSIS — Z95828 Presence of other vascular implants and grafts: Secondary | ICD-10-CM

## 2014-12-19 DIAGNOSIS — E291 Testicular hypofunction: Secondary | ICD-10-CM | POA: Diagnosis not present

## 2014-12-19 DIAGNOSIS — R198 Other specified symptoms and signs involving the digestive system and abdomen: Secondary | ICD-10-CM | POA: Diagnosis not present

## 2014-12-19 LAB — CBC WITH DIFFERENTIAL/PLATELET
BASO%: 0.3 % (ref 0.0–2.0)
Basophils Absolute: 0 10*3/uL (ref 0.0–0.1)
EOS%: 1.5 % (ref 0.0–7.0)
Eosinophils Absolute: 0.1 10*3/uL (ref 0.0–0.5)
HEMATOCRIT: 32.1 % — AB (ref 38.4–49.9)
HEMOGLOBIN: 10.1 g/dL — AB (ref 13.0–17.1)
LYMPH%: 18.7 % (ref 14.0–49.0)
MCH: 26.3 pg — AB (ref 27.2–33.4)
MCHC: 31.5 g/dL — ABNORMAL LOW (ref 32.0–36.0)
MCV: 83.6 fL (ref 79.3–98.0)
MONO#: 0.6 10*3/uL (ref 0.1–0.9)
MONO%: 8.3 % (ref 0.0–14.0)
NEUT#: 4.9 10*3/uL (ref 1.5–6.5)
NEUT%: 71.2 % (ref 39.0–75.0)
Platelets: 179 10*3/uL (ref 140–400)
RBC: 3.84 10*6/uL — ABNORMAL LOW (ref 4.20–5.82)
RDW: 19.4 % — ABNORMAL HIGH (ref 11.0–14.6)
WBC: 6.9 10*3/uL (ref 4.0–10.3)
lymph#: 1.3 10*3/uL (ref 0.9–3.3)

## 2014-12-19 LAB — COMPREHENSIVE METABOLIC PANEL (CC13)
ALT: 10 U/L (ref 0–55)
ANION GAP: 10 meq/L (ref 3–11)
AST: 12 U/L (ref 5–34)
Albumin: 3.5 g/dL (ref 3.5–5.0)
Alkaline Phosphatase: 61 U/L (ref 40–150)
BUN: 15.4 mg/dL (ref 7.0–26.0)
CALCIUM: 9 mg/dL (ref 8.4–10.4)
CO2: 26 mEq/L (ref 22–29)
Chloride: 108 mEq/L (ref 98–109)
Creatinine: 0.9 mg/dL (ref 0.7–1.3)
Glucose: 116 mg/dl (ref 70–140)
Potassium: 4 mEq/L (ref 3.5–5.1)
Sodium: 144 mEq/L (ref 136–145)
TOTAL PROTEIN: 6.6 g/dL (ref 6.4–8.3)
Total Bilirubin: 0.28 mg/dL (ref 0.20–1.20)

## 2014-12-19 MED ORDER — FAMOTIDINE IN NACL 20-0.9 MG/50ML-% IV SOLN
INTRAVENOUS | Status: AC
  Filled 2014-12-19: qty 50

## 2014-12-19 MED ORDER — SODIUM CHLORIDE 0.9 % IJ SOLN
10.0000 mL | INTRAMUSCULAR | Status: DC | PRN
  Administered 2014-12-19: 10 mL via INTRAVENOUS
  Filled 2014-12-19: qty 10

## 2014-12-19 MED ORDER — DEXTROSE 5 % IV SOLN
26.0000 mg/m2 | Freq: Once | INTRAVENOUS | Status: AC
  Administered 2014-12-19: 60 mg via INTRAVENOUS
  Filled 2014-12-19: qty 6

## 2014-12-19 MED ORDER — SODIUM CHLORIDE 0.9 % IJ SOLN
10.0000 mL | INTRAMUSCULAR | Status: DC | PRN
  Administered 2014-12-19: 10 mL
  Filled 2014-12-19: qty 10

## 2014-12-19 MED ORDER — DIPHENHYDRAMINE HCL 50 MG/ML IJ SOLN
INTRAMUSCULAR | Status: AC
  Filled 2014-12-19: qty 1

## 2014-12-19 MED ORDER — FAMOTIDINE IN NACL 20-0.9 MG/50ML-% IV SOLN
20.0000 mg | Freq: Once | INTRAVENOUS | Status: AC
  Administered 2014-12-19: 20 mg via INTRAVENOUS

## 2014-12-19 MED ORDER — SODIUM CHLORIDE 0.9 % IV SOLN
Freq: Once | INTRAVENOUS | Status: AC
  Administered 2014-12-19: 12:00:00 via INTRAVENOUS

## 2014-12-19 MED ORDER — DIPHENHYDRAMINE HCL 50 MG/ML IJ SOLN
25.0000 mg | Freq: Once | INTRAMUSCULAR | Status: AC
  Administered 2014-12-19: 25 mg via INTRAVENOUS

## 2014-12-19 MED ORDER — DENOSUMAB 120 MG/1.7ML ~~LOC~~ SOLN
120.0000 mg | Freq: Once | SUBCUTANEOUS | Status: AC
  Administered 2014-12-19: 120 mg via SUBCUTANEOUS
  Filled 2014-12-19: qty 1.7

## 2014-12-19 MED ORDER — HEPARIN SOD (PORK) LOCK FLUSH 100 UNIT/ML IV SOLN
500.0000 [IU] | Freq: Once | INTRAVENOUS | Status: AC | PRN
  Administered 2014-12-19: 500 [IU]
  Filled 2014-12-19: qty 5

## 2014-12-19 MED ORDER — SODIUM CHLORIDE 0.9 % IV SOLN
Freq: Once | INTRAVENOUS | Status: AC
  Administered 2014-12-19: 13:00:00 via INTRAVENOUS
  Filled 2014-12-19: qty 4

## 2014-12-19 NOTE — Patient Instructions (Signed)
[facility] Discharge Instructions for Patients Receiving Chemotherapy  Today you received the following chemotherapy agents Jevtana  To help prevent nausea and vomiting after your treatment, we encourage you to take your nausea medication     If you develop nausea and vomiting that is not controlled by your nausea medication, call the clinic.   BELOW ARE SYMPTOMS THAT SHOULD BE REPORTED IMMEDIATELY:  *FEVER GREATER THAN 100.5 F  *CHILLS WITH OR WITHOUT FEVER  NAUSEA AND VOMITING THAT IS NOT CONTROLLED WITH YOUR NAUSEA MEDICATION  *UNUSUAL SHORTNESS OF BREATH  *UNUSUAL BRUISING OR BLEEDING  TENDERNESS IN MOUTH AND THROAT WITH OR WITHOUT PRESENCE OF ULCERS  *URINARY PROBLEMS  *BOWEL PROBLEMS  UNUSUAL RASH Items with * indicate a potential emergency and should be followed up as soon as possible.  Feel free to call the clinic you have any questions or concerns. The clinic phone number is [phone].

## 2014-12-19 NOTE — Progress Notes (Signed)
Hematology and Oncology Follow Up Visit  [name] [id number] 06/20/1946 69 y.o. 12/19/2014 4:07 PM   [name], [name]  [name], [name]  Principle Diagnosis:This is a 69 year old gentleman with prostate cancer initially diagnosed in 2001.  He had a Gleason score of 3 + 3 = 6.  He has castration-resistant disease with pelvic adenopathy   Prior Therapy:  1. Status post prostatectomy done in May 2001.  He had T3 N1 disease.  PSA nadir to 0.  2. The patient developed biochemical relapse and received prostatic bed radiation.  3. The patient received Lupron with Casodex due to rising PSA.  The patient subsequently developed castration-resistant disease. 4. Patient treated with Casodex withdrawal and subsequently PSA rose to 18. 5. Patient with Provenge immunotherapy completed in July 2011. 6.  He received ketoconazole and prednisone December 2011 through January 2014. 7.         He is S/P Zytiga 1000 mg daily with Prednisone 5 mg daily on 11/03/12 till 07/2013. This was stopped due to progression of disease.  8.  Xtandi 1000 mg daily 07/2013 through 01/24/2014. Discontinued secondary to disease progression 9.         Docetaxel 75 mg/m2 with Neulasta support, status post 5 cycles. Discontinued 06/05/2014 secondary to disease progression.  Current therapy: 1. Systemic chemotherapy with Jevtana 25 mg/m2 given every 3 weeks. Status post 9 cycles. 2. He is on Lupron 30 mg every 4 months. Last time given on 10/17/2014  Interim History: [name] presents today for a followup visit with his wife. Since the last visit, he reports control of his pelvic pain with his current pain medications.  He continues to tolerate Jevtana so far without difficulty. He does report episodes of loose stools sometimes 2-3 times daily.  He continues to report loose bowel habits at times especially with increased intra-abdominal pressure with urination.  He has not tried taking any Imodium. He is not  reporting any nausea.  He is not reporting any vomiting. He denies any neuropathy or infusion related complications.He does not report any abdominal pain or vomiting. He continues to have some lower extremity edema which has not changed.     Had not reported any genitourinary complaints or bleeding. He reports some burning in his groin area projected with walking that is been present for a long period of time.  He has not reported any new neurological complaints. Denied seizures or decline in his exercise tolerance. He has not reported any lymphadenopathy or pruritus. He has not reported any petechiae or bleeding.  Remainder of his review of systems unremarkable.     Medications: I have reviewed the patient's current medications. Current Outpatient Prescriptions  Medication Sig Dispense Refill  . amLODipine (NORVASC) 5 MG tablet Take 5 mg by mouth every evening.     [name] aspirin 81 MG tablet Take 81 mg by mouth daily.      . calcium carbonate (OS-CAL) 1250 MG chewable tablet Chew 2 tablets by mouth daily.    [name] leuprolide (LUPRON) 30 MG injection Inject 30 mg into the muscle every 4 (four) months. LAST INJECTION JAN 2016    . lidocaine-prilocaine (EMLA) cream Apply topically as needed. 1 g 2  . losartan-hydrochlorothiazide (HYZAAR) 50-12.5 MG per tablet Take 1 tablet by mouth every evening.    . mirabegron ER (MYRBETRIQ) 50 MG TB24 tablet Take 50 mg by mouth every morning.    . ondansetron (ZOFRAN) 8 MG tablet Take 8 mg by  mouth every 8 (eight) hours as needed for nausea or vomiting.    [name] oxyCODONE (OXY IR/ROXICODONE) 5 MG immediate release tablet Take 1 tablet (5 mg total) by mouth every 4 (four) hours as needed for severe pain. 30 tablet 0   No current facility-administered medications for this visit.   Facility-Administered Medications Ordered in Other Visits  Medication Dose Route Frequency Provider Last Rate Last Dose  . sodium chloride 0.9 % injection 10 mL  10 mL Intracatheter PRN [name] Portela, MD   10 mL at 12/19/14 1424    Allergies: No Known Allergies  Past Medical History, Surgical history, Social history, and Family History were reviewed and updated.    Physical Exam: Blood pressure 124/62, pulse 75, temperature 98.5 F (36.9 C), temperature source Oral, resp. rate 18, height 5\' 8"  (1.727 m), weight 230 lb 4.8 oz (104.463 kg), SpO2 99 %. ECOG: 1 General appearance: alert awake appeared in no active distress. Head: Normocephalic, without obvious abnormality, atraumatic Neck: no adenopathy Lymph nodes: Cervical, supraclavicular, and axillary nodes normal. Heart:regular rate and rhythm, S1, S2 normal, no murmur, click, rub or gallop Lung:chest clear, no wheezing, rales, normal symmetric air entry. Chest wall examination: Right anterior chest porta cath, well healed. Abdomen: soft, non-tender, without masses or organomegaly EXT:1+ edema which is unchanged.   Lab Results: Lab Results  Component Value Date   WBC 6.9 12/19/2014   HGB 10.1* 12/19/2014   HCT 32.1* 12/19/2014   MCV 83.6 12/19/2014   PLT 179 12/19/2014     Chemistry      Component Value Date/Time   NA 144 12/19/2014 0926   NA 138 06/24/2014 0729   K 4.0 12/19/2014 0926   K 3.9 06/24/2014 0729   CL 104 02/06/2014 1005   CL 108* 03/09/2013 0923   CO2 26 12/19/2014 0926   CO2 26 02/06/2014 1005   BUN 15.4 12/19/2014 0926   BUN 12 02/06/2014 1005   CREATININE 0.9 12/19/2014 0926   CREATININE 0.89 02/06/2014 1005      Component Value Date/Time   CALCIUM 9.0 12/19/2014 0926   CALCIUM 9.4 02/06/2014 1005   ALKPHOS 61 12/19/2014 0926   ALKPHOS 71 12/27/2012 0415   AST 12 12/19/2014 0926   AST 17 12/27/2012 0415   ALT 10 12/19/2014 0926   ALT 24 12/27/2012 0415   BILITOT 0.28 12/19/2014 0926   BILITOT 0.3 12/27/2012 0415      Results for [name] (MRN [id number]) as of 11/28/2014 09:05  Ref. Range 09/03/2014 09:13 09/25/2014 09:48 10/16/2014 09:37 11/07/2014 08:11 11/27/2014  09:21  PSA Latest Range: <=4.00 ng/mL 739.30 (H) 655.80 (H) 728.60 (H) 681.30 (H) 832.80 (H)     RADIOLOGY STUDIES:  No results found.  Impression and Plan:  This is a 68 year old gentleman with the following issues: 1. Castration-resistant prostate cancer with pelvic adenopathy. He is now on systemic chemotherapy in the form Jevtana  with Neulasta support. He has tolerated chemotherapy well at this time with reasonable response in his PSA. His PSA has dropped from around 1100 down to the 700 range. His PSA most recently was up to around 800. The plan is to continue the current dose and schedule and he will receive chemotherapy today and another cycle in 3 weeks. The plan is to restage him with a CT scan after 10 cycles of chemotherapy.  2. IV access: Status post right anterior chest Port-A-Cath placement on 02/06/2014. Continue EMLA cream as needed. No complications at this  time. 3. Hydronephrosis: He is S/P stent placement. This was replaced recently. 4. Hormonal deprivation.  He continues to be on Lupron. Last given October 17, 2014  and will be repeated in May 2016. 5. 3.8 cm enhancing mass in the upper pole of the left kidney likely renal cell carcinoma. We will continue  observation and will followup on at with his next CT scan. 6. 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 Discussed initiation of bone directed therapy with either Zometa or Xgeva. Side effects discussed including possible osteonecrosis of the jaw as well as hypokalemia. He is willing to proceed and we will start him on 12/19/2014 and he will receive it every 4-6 weeks depending on any future chemotherapy appointments. 7. Pain the pelvic area: Could be related to his malignancy. This is better controlled with the the Roxicodone. Restaging CT scan is scheduled for 01/08/2015  8. Loose stool: Patient was advised to try Imodium for this  issue. 9. Followup will be in in 3 week for the start of cycle 11 Jevtana chemotherapy.    Carlton Adam, PA-C  3/10/20164:07 PM

## 2014-12-19 NOTE — Patient Instructions (Signed)

## 2014-12-20 ENCOUNTER — Telehealth: Payer: Self-pay | Admitting: *Deleted

## 2014-12-20 ENCOUNTER — Ambulatory Visit (HOSPITAL_BASED_OUTPATIENT_CLINIC_OR_DEPARTMENT_OTHER): Payer: Medicare Other

## 2014-12-20 DIAGNOSIS — C61 Malignant neoplasm of prostate: Secondary | ICD-10-CM

## 2014-12-20 DIAGNOSIS — Z5189 Encounter for other specified aftercare: Secondary | ICD-10-CM | POA: Diagnosis not present

## 2014-12-20 DIAGNOSIS — C7951 Secondary malignant neoplasm of bone: Secondary | ICD-10-CM

## 2014-12-20 LAB — PSA: PSA: 858.9 ng/mL — AB (ref <=4.00)

## 2014-12-20 MED ORDER — PEGFILGRASTIM INJECTION 6 MG/0.6ML ~~LOC~~
6.0000 mg | PREFILLED_SYRINGE | Freq: Once | SUBCUTANEOUS | Status: AC
  Administered 2014-12-20: 6 mg via SUBCUTANEOUS
  Filled 2014-12-20: qty 0.6

## 2014-12-20 NOTE — Patient Instructions (Signed)
Try taking Imodium as discussed for your loose stools Follow-up in 3 weeks

## 2014-12-20 NOTE — Telephone Encounter (Signed)
Called patient on cell phone to let him know we needed to change his appointment time.  Left a message on voice mail

## 2014-12-24 ENCOUNTER — Other Ambulatory Visit: Payer: Medicare Other

## 2015-01-08 ENCOUNTER — Other Ambulatory Visit (HOSPITAL_BASED_OUTPATIENT_CLINIC_OR_DEPARTMENT_OTHER): Payer: Medicare Other

## 2015-01-08 ENCOUNTER — Encounter (HOSPITAL_COMMUNITY): Payer: Self-pay

## 2015-01-08 ENCOUNTER — Ambulatory Visit (HOSPITAL_COMMUNITY)
Admission: RE | Admit: 2015-01-08 | Discharge: 2015-01-08 | Disposition: A | Discharge status: Home / Self Care | Payer: Medicare Other | Source: Ambulatory Visit | Attending: Oncology | Admitting: Oncology

## 2015-01-08 DIAGNOSIS — Z8546 Personal history of malignant neoplasm of prostate: Secondary | ICD-10-CM | POA: Insufficient documentation

## 2015-01-08 DIAGNOSIS — C61 Malignant neoplasm of prostate: Secondary | ICD-10-CM | POA: Diagnosis not present

## 2015-01-08 DIAGNOSIS — C7951 Secondary malignant neoplasm of bone: Secondary | ICD-10-CM

## 2015-01-08 DIAGNOSIS — N2889 Other specified disorders of kidney and ureter: Secondary | ICD-10-CM | POA: Diagnosis not present

## 2015-01-08 DIAGNOSIS — N1330 Unspecified hydronephrosis: Secondary | ICD-10-CM | POA: Insufficient documentation

## 2015-01-08 DIAGNOSIS — R102 Pelvic and perineal pain: Secondary | ICD-10-CM | POA: Diagnosis present

## 2015-01-08 DIAGNOSIS — C779 Secondary and unspecified malignant neoplasm of lymph node, unspecified: Secondary | ICD-10-CM | POA: Diagnosis not present

## 2015-01-08 DIAGNOSIS — Z9079 Acquired absence of other genital organ(s): Secondary | ICD-10-CM | POA: Diagnosis not present

## 2015-01-08 LAB — CBC WITH DIFFERENTIAL/PLATELET
BASO%: 1.2 % (ref 0.0–2.0)
Basophils Absolute: 0.1 10*3/uL (ref 0.0–0.1)
EOS%: 1.4 % (ref 0.0–7.0)
Eosinophils Absolute: 0.1 10*3/uL (ref 0.0–0.5)
HEMATOCRIT: 32.6 % — AB (ref 38.4–49.9)
HGB: 10.1 g/dL — ABNORMAL LOW (ref 13.0–17.1)
LYMPH%: 17.3 % (ref 14.0–49.0)
MCH: 25.9 pg — ABNORMAL LOW (ref 27.2–33.4)
MCHC: 31.1 g/dL — ABNORMAL LOW (ref 32.0–36.0)
MCV: 83.3 fL (ref 79.3–98.0)
MONO#: 0.7 10*3/uL (ref 0.1–0.9)
MONO%: 9.6 % (ref 0.0–14.0)
NEUT#: 4.9 10*3/uL (ref 1.5–6.5)
NEUT%: 70.5 % (ref 39.0–75.0)
Platelets: 239 10*3/uL (ref 140–400)
RBC: 3.91 10*6/uL — ABNORMAL LOW (ref 4.20–5.82)
RDW: 19 % — AB (ref 11.0–14.6)
WBC: 7 10*3/uL (ref 4.0–10.3)
lymph#: 1.2 10*3/uL (ref 0.9–3.3)

## 2015-01-08 LAB — COMPREHENSIVE METABOLIC PANEL (CC13)
ALT: 11 U/L (ref 0–55)
AST: 12 U/L (ref 5–34)
Albumin: 3.7 g/dL (ref 3.5–5.0)
Alkaline Phosphatase: 58 U/L (ref 40–150)
Anion Gap: 10 mEq/L (ref 3–11)
BILIRUBIN TOTAL: 0.46 mg/dL (ref 0.20–1.20)
BUN: 15.5 mg/dL (ref 7.0–26.0)
CALCIUM: 8.9 mg/dL (ref 8.4–10.4)
CO2: 26 mEq/L (ref 22–29)
CREATININE: 1 mg/dL (ref 0.7–1.3)
Chloride: 107 mEq/L (ref 98–109)
EGFR: >90 mL/min/{1.73_m2} (ref >90)
Glucose: 103 mg/dl (ref 70–140)
POTASSIUM: 4.1 meq/L (ref 3.5–5.1)
Sodium: 143 mEq/L (ref 136–145)
Total Protein: 6.9 g/dL (ref 6.4–8.3)

## 2015-01-08 MED ORDER — IOHEXOL 300 MG/ML  SOLN
100.0000 mL | Freq: Once | INTRAMUSCULAR | Status: AC | PRN
  Administered 2015-01-08: 100 mL via INTRAVENOUS

## 2015-01-09 ENCOUNTER — Telehealth: Payer: Self-pay | Admitting: Oncology

## 2015-01-09 ENCOUNTER — Telehealth: Payer: Self-pay | Admitting: *Deleted

## 2015-01-09 ENCOUNTER — Ambulatory Visit (HOSPITAL_BASED_OUTPATIENT_CLINIC_OR_DEPARTMENT_OTHER): Payer: Medicare Other

## 2015-01-09 ENCOUNTER — Ambulatory Visit (HOSPITAL_BASED_OUTPATIENT_CLINIC_OR_DEPARTMENT_OTHER): Payer: Medicare Other | Admitting: Oncology

## 2015-01-09 VITALS — BP 104/57 | HR 72 | Temp 97.6°F | Resp 19 | Ht 68.0 in | Wt 228.9 lb

## 2015-01-09 DIAGNOSIS — C61 Malignant neoplasm of prostate: Secondary | ICD-10-CM

## 2015-01-09 DIAGNOSIS — Z5111 Encounter for antineoplastic chemotherapy: Secondary | ICD-10-CM | POA: Diagnosis not present

## 2015-01-09 DIAGNOSIS — R110 Nausea: Secondary | ICD-10-CM | POA: Diagnosis not present

## 2015-01-09 DIAGNOSIS — R5383 Other fatigue: Secondary | ICD-10-CM | POA: Diagnosis not present

## 2015-01-09 DIAGNOSIS — E291 Testicular hypofunction: Secondary | ICD-10-CM | POA: Diagnosis not present

## 2015-01-09 LAB — PSA: PSA: 966.5 ng/mL — ABNORMAL HIGH (ref <=4.00)

## 2015-01-09 MED ORDER — DIPHENHYDRAMINE HCL 50 MG/ML IJ SOLN
25.0000 mg | Freq: Once | INTRAMUSCULAR | Status: AC
  Administered 2015-01-09: 25 mg via INTRAVENOUS

## 2015-01-09 MED ORDER — HEPARIN SOD (PORK) LOCK FLUSH 100 UNIT/ML IV SOLN
500.0000 [IU] | Freq: Once | INTRAVENOUS | Status: AC | PRN
  Administered 2015-01-09: 500 [IU]
  Filled 2015-01-09: qty 5

## 2015-01-09 MED ORDER — SODIUM CHLORIDE 0.9 % IJ SOLN
10.0000 mL | INTRAMUSCULAR | Status: DC | PRN
Start: 2015-01-09 — End: 2015-01-09
  Administered 2015-01-09: 10 mL
  Filled 2015-01-09: qty 10

## 2015-01-09 MED ORDER — SODIUM CHLORIDE 0.9 % IV SOLN
Freq: Once | INTRAVENOUS | Status: AC
  Administered 2015-01-09: 10:00:00 via INTRAVENOUS

## 2015-01-09 MED ORDER — FAMOTIDINE IN NACL 20-0.9 MG/50ML-% IV SOLN
INTRAVENOUS | Status: AC
  Filled 2015-01-09: qty 50

## 2015-01-09 MED ORDER — DEXTROSE 5 % IV SOLN
20.0000 mg/m2 | Freq: Once | INTRAVENOUS | Status: AC
  Administered 2015-01-09: 46 mg via INTRAVENOUS
  Filled 2015-01-09: qty 4.6

## 2015-01-09 MED ORDER — SODIUM CHLORIDE 0.9 % IV SOLN
Freq: Once | INTRAVENOUS | Status: AC
  Administered 2015-01-09: 10:00:00 via INTRAVENOUS
  Filled 2015-01-09: qty 4

## 2015-01-09 MED ORDER — DIPHENHYDRAMINE HCL 50 MG/ML IJ SOLN
INTRAMUSCULAR | Status: AC
  Filled 2015-01-09: qty 1

## 2015-01-09 MED ORDER — FAMOTIDINE IN NACL 20-0.9 MG/50ML-% IV SOLN
20.0000 mg | Freq: Once | INTRAVENOUS | Status: AC
  Administered 2015-01-09: 20 mg via INTRAVENOUS

## 2015-01-09 NOTE — Telephone Encounter (Signed)
Gave avs & calendar for April/May. Sent message to schedule treatment. °

## 2015-01-09 NOTE — Progress Notes (Signed)
Hematology and Oncology Follow Up Visit  [name] [id number] 1946-04-26 69 y.o. 01/09/2015 8:38 AM   [name], [name]  [name] [name], [name]  Principle Diagnosis:This is a 69 year old gentleman with prostate cancer initially diagnosed in 2001.  He had a Gleason score of 3 + 3 = 6.  He has castration-resistant disease with pelvic adenopathy   Prior Therapy:  1. Status post prostatectomy done in May 2001.  He had T3 N1 disease.  PSA nadir to 0.  2. The patient developed biochemical relapse and received prostatic bed radiation.  3. The patient received Lupron with Casodex due to rising PSA.  The patient subsequently developed castration-resistant disease. 4. Patient treated with Casodex withdrawal and subsequently PSA rose to 18. 5. Patient with Provenge immunotherapy completed in July 2011. 6.  He received ketoconazole and prednisone December 2011 through January 2014. 7.         He is S/P Zytiga 1000 mg daily with Prednisone 5 mg daily on 11/03/12 till 07/2013. This was stopped due to progression of disease.  8.  Xtandi 1000 mg daily 07/2013 through 01/24/2014. Discontinued secondary to disease progression 9.         Docetaxel 75 mg/m2 with Neulasta support, status post 5 cycles. Discontinued 06/05/2014 secondary to disease progression.  Current therapy: 1. Systemic chemotherapy with Jevtana 25 mg/m2 given every 3 weeks. Status post 10 cycles. 2. He is on Lupron 30 mg every 4 months. Last time given on 10/17/2014  Interim History: [name] presents today for a followup visit with his wife. Since the last visit, he reports doing fairly well.  He continues to tolerate Jevtana with slightly increased complications. He is reporting more fatigue as well as increased nausea. He is using Zofran more often but did not vomit. His appetite has decreased as well. He does report episodes of loose stools sometimes 2-3 times daily. He denies any neuropathy or infusion related  complications.He does not report any abdominal pain or vomiting. He continues to have some lower extremity edema which has not changed.  His quality of life although slowly decline but relatively stable overall.  Had not reported any genitourinary complaints or bleeding. He reports some burning in his groin area projected with walking that is been present for a long period of time.  He has not reported any new neurological complaints. Denied seizures or decline in his exercise tolerance. He has not reported any lymphadenopathy or pruritus. He has not reported any petechiae or bleeding.  Remainder of his review of systems unremarkable.     Medications: I have reviewed the patient's current medications. Current Outpatient Prescriptions  Medication Sig Dispense Refill  . amLODipine (NORVASC) 10 MG tablet Take 10 mg by mouth daily.    [name] aspirin 81 MG tablet Take 81 mg by mouth daily.      . calcium carbonate (OS-CAL) 1250 MG chewable tablet Chew 2 tablets by mouth daily.    [name] leuprolide (LUPRON) 30 MG injection Inject 30 mg into the muscle every 4 (four) months. LAST INJECTION JAN 2016    . lidocaine-prilocaine (EMLA) cream Apply topically as needed. 1 g 2  . losartan-hydrochlorothiazide (HYZAAR) 50-12.5 MG per tablet Take 1 tablet by mouth every evening.    . mirabegron ER (MYRBETRIQ) 50 MG TB24 tablet Take 50 mg by mouth every morning.    . ondansetron (ZOFRAN) 8 MG tablet Take 8 mg by mouth every 8 (eight) hours as needed for nausea or vomiting.    [name]  oxyCODONE (OXY IR/ROXICODONE) 5 MG immediate release tablet Take 1 tablet (5 mg total) by mouth every 4 (four) hours as needed for severe pain. 30 tablet 0   No current facility-administered medications for this visit.    Allergies: No Known Allergies  Past Medical History, Surgical history, Social history, and Family History were reviewed and updated.    Physical Exam: Blood pressure 104/57, pulse 72, temperature 97.6 F (36.4 C),  temperature source Oral, resp. rate 19, height 5\' 8"  (1.727 m), weight 228 lb 14.4 oz (103.828 kg), SpO2 100 %. ECOG: 1 General appearance: alert awake appeared in no active distress. Head: Normocephalic, without obvious abnormality, atraumatic Neck: no adenopathy Lymph nodes: Cervical, supraclavicular, and axillary nodes normal. Heart:regular rate and rhythm, S1, S2 normal, no murmur, click, rub or gallop Lung:chest clear, no wheezing, rales, normal symmetric air entry. Chest wall examination: Right anterior chest porta cath, well healed. Abdomen: soft, non-tender, without masses or organomegaly EXT:1+ edema which is unchanged.   Lab Results: Lab Results  Component Value Date   WBC 7.0 01/08/2015   HGB 10.1* 01/08/2015   HCT 32.6* 01/08/2015   MCV 83.3 01/08/2015   PLT 239 01/08/2015     Chemistry      Component Value Date/Time   NA 143 01/08/2015 0829   NA 138 06/24/2014 0729   K 4.1 01/08/2015 0829   K 3.9 06/24/2014 0729   CL 104 02/06/2014 1005   CL 108* 03/09/2013 0923   CO2 26 01/08/2015 0829   CO2 26 02/06/2014 1005   BUN 15.5 01/08/2015 0829   BUN 12 02/06/2014 1005   CREATININE 1.0 01/08/2015 0829   CREATININE 0.89 02/06/2014 1005      Component Value Date/Time   CALCIUM 8.9 01/08/2015 0829   CALCIUM 9.4 02/06/2014 1005   ALKPHOS 58 01/08/2015 0829   ALKPHOS 71 12/27/2012 0415   AST 12 01/08/2015 0829   AST 17 12/27/2012 0415   ALT 11 01/08/2015 0829   ALT 24 12/27/2012 0415   BILITOT 0.46 01/08/2015 0829   BILITOT 0.3 12/27/2012 0415       Results for [name] (MRN [id number]) as of 01/09/2015 08:09  Ref. Range 11/27/2014 09:21 12/19/2014 09:25 01/08/2015 08:28  PSA Latest Range: <=4.00 ng/mL 832.80 (H) 858.90 (H) 966.50 (H)     RADIOLOGY STUDIES:  Ct Abdomen Pelvis W Contrast  01/08/2015   CLINICAL DATA:  69 year old male with history of prostate cancer. Originally diagnosed in 2001 with bone metastases. Pelvic pain since 11/28/2014,  worsening now. Status post prostatectomy.  EXAM: CT ABDOMEN AND PELVIS WITH CONTRAST  TECHNIQUE: Multidetector CT imaging of the abdomen and pelvis was performed using the standard protocol following bolus administration of intravenous contrast.  CONTRAST:  125mL OMNIPAQUE IOHEXOL 300 MG/ML  SOLN  COMPARISON:  Multiple priors, most recently 06/04/2014.  FINDINGS: Lower chest:  Unremarkable.  Hepatobiliary: No cystic or solid hepatic lesions. No intra or extrahepatic biliary ductal dilatation. Gallbladder is normal in appearance.  Pancreas: Unremarkable.  Spleen: Unremarkable.  Adrenals/Urinary Tract: Right-sided double-J ureteral stent appears stable in position with proximal loop reformed in the right renal pelvis and distal loop reformed in the urinary bladder. There continues to be some very mild right-sided hydroureteronephrosis, however, this has decreased compared to the prior examination. Several low-attenuation lesions in the kidneys bilaterally, many of which are too small to definitively characterize, the largest of these are compatible with cysts, with the largest lesion measuring 3.3 x 2.0 cm in the lower  pole of the left kidney. Additionally, in the upper pole of the left kidney there is an exophytic 4.4 x 4.2 cm enhancing renal mass, which is slightly larger than the prior examination (previously 3.7 x 3.9 cm). Urinary bladder is nearly completely decompressed, but otherwise unremarkable in appearance.  Stomach/Bowel: The appearance of the stomach is normal. No pathologic dilatation of small bowel or colon. Normal appendix.  Vascular/Lymphatic: Progressive worsening lymphadenopathy throughout the abdomen and pelvis. Multiple previously noted enlarged mesorectal lymph nodes have significantly increased in size, with the largest node currently measuring up to 2.5 cm in short axis on image 61 of series 2 (previously 2.2 cm). Worsening common iliac lymphadenopathy, with the largest lymph node measuring 3.4  x 3.9 cm on image 44 of series 2 (previously 3.5 x 2.8 cm). Very large retroperitoneal nodal mass, significantly more bulky than the prior examination, currently measuring up to 15.1 x 8.6 cm (measured in a different position than the prior study such that today's increased measurement partially reflects difference in measurement technique, but does encompass the largest bulk of the mass). Increasing hepatoduodenal ligament lymphadenopathy, with the largest node measuring 6.1 x 4.9 cm on image 23 of series 2 (previously 5.6 x 4.5 cm). Multiple other enlarged retroperitoneal and upper abdominal lymph nodes generally appear larger than the prior examination, compatible with progressively worsening metastatic disease. Atherosclerosis throughout the abdominal and pelvic vasculature, without evidence of aneurysm. Retroperitoneal lymphadenopathy is partially encasing the celiac axis and its branches, SMA, and IMA, however, these all appear patent at this time. Notably, the adenopathy is also encasing the left renal vein, which is very difficult to visualize on today's examination, nearly completely compressed/occluded. Renal arteries are partially encased bilaterally (right greater than left), but remain patent at this time. Inferior vena cava is encased by the mass, and difficult to visualize (likely significantly stenosed but not yet completely occluded).  Reproductive: Status post radical prostatectomy. Penile prosthesis with pump/reservoir lower left hemipelvis.  Other: No significant volume of ascites.  No pneumoperitoneum.  Musculoskeletal: Several small scattered sclerotic lesions noted in the visualized axial and appendicular skeleton, presumably osseous metastasis, the largest of which is in the right ilium immediately superior to the right acetabulum, measuring 2.4 x 1.8 cm (similar to prior examinations).  IMPRESSION: 1. Today's study demonstrates progressively worsening lymph node metastasis throughout the  abdomen and pelvis, as detailed above. At this point, the retroperitoneal lymphadenopathy is encasing and narrowing many vascular structures, most notable for left renal vein encasement, which causes either high-grade stenosis or complete occlusion of the vein at this time. The left kidney appears to enhance normally at this time, indicative of adequate collateral venous outflow. 2. Slight interval enlargement of exophytic enhancing upper pole left renal mass, which remains suspicious for a renal cell carcinoma. 3. Right-sided double-J ureteral stent is stable in position. Previously noted right-sided hydroureteronephrosis has decreased slightly compared to the prior exam of the. 4. Additional incidental findings, as above.   Electronically Signed   By: Vinnie Langton [name]   On: 01/08/2015 10:36    Impression and Plan:  This is a 69 year old gentleman with the following issues: 1. Castration-resistant prostate cancer with pelvic adenopathy. He is now on systemic chemotherapy in the form Jevtana  with Neulasta support. CT scan results from 01/08/2015 as well as his PSA of 966 were discussed today. He has some element of progression of disease based on CT scan and PSA criteria. Options of treatments were discussed today extensively with the  patient. I offered him best supportive care versus continuing Jevtana chemotherapy versus different chemotherapy agents. He has been on extensive treatments in the past and the only other option that he has not used his mitoxantrone. The efficacy of this drug has been very limited in this particular setting. After discussing the risks and benefits of all these approaches we have elected to proceed with [facility] for a few more cycles at a lower dose of 20 mg/m for better tolerance. He understands that his disease is becoming refractory to treatment and eventually will take over his body and will continue to decline. He understand we are dealing with incurable malignancy at  this time and we'll try to palliate it as much as possible. 2. IV access: Status post right anterior chest Port-A-Cath placement on 02/06/2014. Continue EMLA cream as needed. No complications at this time. 3. Hydronephrosis: He is S/P stent placement. This was replaced recently. 4. Hormonal deprivation.  He continues to be on Lupron. Last given October 17, 2014  and will be repeated in May 2016. 5. 3.8 cm enhancing mass in the upper pole of the left kidney likely renal cell carcinoma. We will continue observation. Given his advanced prostate malignancy I see no need for immediate intervention. 6. Directed therapy: He is currently on Xgeva started on 12/19/2014 and that will be repeated on 01/30/2015 and every 6 week.  7. Pain the pelvic area: Could be related to his malignancy. This is better controlled with the the Roxicodone.  8. Loose stool: Patient was advised to try Imodium for this issue. 9. Followup will be in in 3 week for the start of cycle 12 Jevtana chemotherapy.    [facility], MD 3/31/20168:38 AM

## 2015-01-09 NOTE — Telephone Encounter (Signed)
Per staff message and POF I have scheduled appts. Advised scheduler of appts. JMW  

## 2015-01-09 NOTE — Patient Instructions (Signed)
[facility] Discharge Instructions for Patients Receiving Chemotherapy  Today you received the following chemotherapy agents Jevtana  To help prevent nausea and vomiting after your treatment, we encourage you to take your nausea medication     If you develop nausea and vomiting that is not controlled by your nausea medication, call the clinic.   BELOW ARE SYMPTOMS THAT SHOULD BE REPORTED IMMEDIATELY:  *FEVER GREATER THAN 100.5 F  *CHILLS WITH OR WITHOUT FEVER  NAUSEA AND VOMITING THAT IS NOT CONTROLLED WITH YOUR NAUSEA MEDICATION  *UNUSUAL SHORTNESS OF BREATH  *UNUSUAL BRUISING OR BLEEDING  TENDERNESS IN MOUTH AND THROAT WITH OR WITHOUT PRESENCE OF ULCERS  *URINARY PROBLEMS  *BOWEL PROBLEMS  UNUSUAL RASH Items with * indicate a potential emergency and should be followed up as soon as possible.  Feel free to call the clinic you have any questions or concerns. The clinic phone number is [phone].  Please show the [facility] at check-in to the Emergency Department and triage nurse.

## 2015-01-10 ENCOUNTER — Ambulatory Visit (HOSPITAL_BASED_OUTPATIENT_CLINIC_OR_DEPARTMENT_OTHER): Payer: Medicare Other

## 2015-01-10 DIAGNOSIS — C61 Malignant neoplasm of prostate: Secondary | ICD-10-CM | POA: Diagnosis not present

## 2015-01-10 DIAGNOSIS — C7951 Secondary malignant neoplasm of bone: Secondary | ICD-10-CM | POA: Diagnosis not present

## 2015-01-10 MED ORDER — PEGFILGRASTIM INJECTION 6 MG/0.6ML ~~LOC~~
6.0000 mg | PREFILLED_SYRINGE | Freq: Once | SUBCUTANEOUS | Status: AC
  Administered 2015-01-10: 6 mg via SUBCUTANEOUS
  Filled 2015-01-10: qty 0.6

## 2015-01-15 ENCOUNTER — Telehealth: Payer: Self-pay | Admitting: Oncology

## 2015-01-15 NOTE — Telephone Encounter (Signed)
Faxed pt medical records to [facility] and requested slides to be fedex'ed

## 2015-01-30 ENCOUNTER — Ambulatory Visit (HOSPITAL_BASED_OUTPATIENT_CLINIC_OR_DEPARTMENT_OTHER): Payer: Medicare Other | Admitting: Physician Assistant

## 2015-01-30 ENCOUNTER — Other Ambulatory Visit (HOSPITAL_BASED_OUTPATIENT_CLINIC_OR_DEPARTMENT_OTHER): Payer: Medicare Other

## 2015-01-30 ENCOUNTER — Ambulatory Visit (HOSPITAL_BASED_OUTPATIENT_CLINIC_OR_DEPARTMENT_OTHER): Payer: Medicare Other

## 2015-01-30 ENCOUNTER — Ambulatory Visit: Payer: Medicare Other

## 2015-01-30 ENCOUNTER — Encounter: Payer: Self-pay | Admitting: Physician Assistant

## 2015-01-30 VITALS — BP 125/63 | HR 73 | Temp 98.9°F | Resp 18 | Ht 68.0 in | Wt 225.6 lb

## 2015-01-30 DIAGNOSIS — Z5111 Encounter for antineoplastic chemotherapy: Secondary | ICD-10-CM | POA: Diagnosis present

## 2015-01-30 DIAGNOSIS — E291 Testicular hypofunction: Secondary | ICD-10-CM | POA: Diagnosis not present

## 2015-01-30 DIAGNOSIS — C61 Malignant neoplasm of prostate: Secondary | ICD-10-CM

## 2015-01-30 DIAGNOSIS — R198 Other specified symptoms and signs involving the digestive system and abdomen: Secondary | ICD-10-CM | POA: Diagnosis not present

## 2015-01-30 DIAGNOSIS — Z95828 Presence of other vascular implants and grafts: Secondary | ICD-10-CM

## 2015-01-30 DIAGNOSIS — R102 Pelvic and perineal pain: Secondary | ICD-10-CM | POA: Diagnosis not present

## 2015-01-30 DIAGNOSIS — C7951 Secondary malignant neoplasm of bone: Secondary | ICD-10-CM

## 2015-01-30 LAB — CBC WITH DIFFERENTIAL/PLATELET
BASO%: 1 % (ref 0.0–2.0)
Basophils Absolute: 0.1 10*3/uL (ref 0.0–0.1)
EOS%: 1.8 % (ref 0.0–7.0)
Eosinophils Absolute: 0.2 10*3/uL (ref 0.0–0.5)
HEMATOCRIT: 32.1 % — AB (ref 38.4–49.9)
HGB: 9.8 g/dL — ABNORMAL LOW (ref 13.0–17.1)
LYMPH#: 1 10*3/uL (ref 0.9–3.3)
LYMPH%: 9.3 % — ABNORMAL LOW (ref 14.0–49.0)
MCH: 25.6 pg — ABNORMAL LOW (ref 27.2–33.4)
MCHC: 30.6 g/dL — AB (ref 32.0–36.0)
MCV: 83.6 fL (ref 79.3–98.0)
MONO#: 0.8 10*3/uL (ref 0.1–0.9)
MONO%: 7.2 % (ref 0.0–14.0)
NEUT#: 9 10*3/uL — ABNORMAL HIGH (ref 1.5–6.5)
NEUT%: 80.7 % — AB (ref 39.0–75.0)
Platelets: 210 10*3/uL (ref 140–400)
RBC: 3.84 10*6/uL — ABNORMAL LOW (ref 4.20–5.82)
RDW: 18 % — AB (ref 11.0–14.6)
WBC: 11.2 10*3/uL — AB (ref 4.0–10.3)

## 2015-01-30 LAB — COMPREHENSIVE METABOLIC PANEL (CC13)
ALK PHOS: 61 U/L (ref 40–150)
ALT: 12 U/L (ref 0–55)
ANION GAP: 13 meq/L — AB (ref 3–11)
AST: 13 U/L (ref 5–34)
Albumin: 3.6 g/dL (ref 3.5–5.0)
BILIRUBIN TOTAL: 0.27 mg/dL (ref 0.20–1.20)
BUN: 19.9 mg/dL (ref 7.0–26.0)
CO2: 21 meq/L — AB (ref 22–29)
CREATININE: 0.8 mg/dL (ref 0.7–1.3)
Calcium: 8.5 mg/dL (ref 8.4–10.4)
Chloride: 107 mEq/L (ref 98–109)
Glucose: 105 mg/dl (ref 70–140)
Potassium: 3.8 mEq/L (ref 3.5–5.1)
SODIUM: 141 meq/L (ref 136–145)
TOTAL PROTEIN: 6.7 g/dL (ref 6.4–8.3)

## 2015-01-30 MED ORDER — FAMOTIDINE IN NACL 20-0.9 MG/50ML-% IV SOLN
20.0000 mg | Freq: Once | INTRAVENOUS | Status: AC
  Administered 2015-01-30: 20 mg via INTRAVENOUS

## 2015-01-30 MED ORDER — OXYCODONE HCL 5 MG PO TABS: 5.0000 mg | ORAL_TABLET | ORAL | Status: DC | PRN

## 2015-01-30 MED ORDER — SODIUM CHLORIDE 0.9 % IV SOLN
Freq: Once | INTRAVENOUS | Status: AC
  Administered 2015-01-30: 10:00:00 via INTRAVENOUS

## 2015-01-30 MED ORDER — HEPARIN SOD (PORK) LOCK FLUSH 100 UNIT/ML IV SOLN
500.0000 [IU] | Freq: Once | INTRAVENOUS | Status: AC | PRN
  Administered 2015-01-30: 500 [IU]
  Filled 2015-01-30: qty 5

## 2015-01-30 MED ORDER — SODIUM CHLORIDE 0.9 % IJ SOLN
10.0000 mL | INTRAMUSCULAR | Status: DC | PRN
  Administered 2015-01-30: 10 mL
  Filled 2015-01-30: qty 10

## 2015-01-30 MED ORDER — DIPHENHYDRAMINE HCL 50 MG/ML IJ SOLN
25.0000 mg | Freq: Once | INTRAMUSCULAR | Status: AC
  Administered 2015-01-30: 25 mg via INTRAVENOUS

## 2015-01-30 MED ORDER — FAMOTIDINE IN NACL 20-0.9 MG/50ML-% IV SOLN
INTRAVENOUS | Status: AC
  Filled 2015-01-30: qty 50

## 2015-01-30 MED ORDER — DEXTROSE 5 % IV SOLN
20.0000 mg/m2 | Freq: Once | INTRAVENOUS | Status: AC
  Administered 2015-01-30: 46 mg via INTRAVENOUS
  Filled 2015-01-30: qty 4.6

## 2015-01-30 MED ORDER — SODIUM CHLORIDE 0.9 % IV SOLN
Freq: Once | INTRAVENOUS | Status: AC
  Administered 2015-01-30: 10:00:00 via INTRAVENOUS
  Filled 2015-01-30: qty 4

## 2015-01-30 MED ORDER — DENOSUMAB 120 MG/1.7ML ~~LOC~~ SOLN
120.0000 mg | Freq: Once | SUBCUTANEOUS | Status: AC
  Administered 2015-01-30: 120 mg via SUBCUTANEOUS
  Filled 2015-01-30: qty 1.7

## 2015-01-30 MED ORDER — DIPHENHYDRAMINE HCL 50 MG/ML IJ SOLN
INTRAMUSCULAR | Status: AC
  Filled 2015-01-30: qty 1

## 2015-01-30 MED ORDER — SODIUM CHLORIDE 0.9 % IJ SOLN
10.0000 mL | INTRAMUSCULAR | Status: DC | PRN
  Administered 2015-01-30: 10 mL via INTRAVENOUS
  Filled 2015-01-30: qty 10

## 2015-01-30 NOTE — Patient Instructions (Signed)
[facility] Discharge Instructions for Patients Receiving Chemotherapy  Today you received the following chemotherapy agents Jevtana.  To help prevent nausea and vomiting after your treatment, we encourage you to take your nausea medication as directed.  If you develop nausea and vomiting that is not controlled by your nausea medication, call the clinic.   BELOW ARE SYMPTOMS THAT SHOULD BE REPORTED IMMEDIATELY:  *FEVER GREATER THAN 100.5 F  *CHILLS WITH OR WITHOUT FEVER  NAUSEA AND VOMITING THAT IS NOT CONTROLLED WITH YOUR NAUSEA MEDICATION  *UNUSUAL SHORTNESS OF BREATH  *UNUSUAL BRUISING OR BLEEDING  TENDERNESS IN MOUTH AND THROAT WITH OR WITHOUT PRESENCE OF ULCERS  *URINARY PROBLEMS  *BOWEL PROBLEMS  UNUSUAL RASH Items with * indicate a potential emergency and should be followed up as soon as possible.  Feel free to call the clinic you have any questions or concerns. The clinic phone number is [phone].  Please show the CHEMO ALERT CARD at check-in to the Emergency Department and triage nurse.    

## 2015-01-30 NOTE — Progress Notes (Signed)
Hematology and Oncology Follow Up Visit  [name] [id number] Mar 29, 1946 69 y.o. 01/30/2015 10:30 AM   [name], [name]  [name], [name]  Principle Diagnosis:This is a 69 year old gentleman with prostate cancer initially diagnosed in 2001.  He had a Gleason score of 3 + 3 = 6.  He has castration-resistant disease with pelvic adenopathy   Prior Therapy:  1. Status post prostatectomy done in May 2001.  He had T3 N1 disease.  PSA nadir to 0.  2. The patient developed biochemical relapse and received prostatic bed radiation.  3. The patient received Lupron with Casodex due to rising PSA.  The patient subsequently developed castration-resistant disease. 4. Patient treated with Casodex withdrawal and subsequently PSA rose to 18. 5. Patient with Provenge immunotherapy completed in July 2011. 6.  He received ketoconazole and prednisone December 2011 through January 2014. 7.         He is S/P Zytiga 1000 mg daily with Prednisone 5 mg daily on 11/03/12 till 07/2013. This was stopped due to progression of disease.  8.  Xtandi 1000 mg daily 07/2013 through 01/24/2014. Discontinued secondary to disease progression 9.         Docetaxel 75 mg/m2 with Neulasta support, status post 5 cycles. Discontinued 06/05/2014 secondary to disease progression.  Current therapy: 1. Systemic chemotherapy with Jevtana 25 mg/m2 given every 3 weeks. Status post 11 cycles. 2. He is on Lupron 30 mg every 4 months. Last time given on 10/17/2014  Interim History: [name] presents today for a followup visit with his wife. Since the last visit, he reports doing fairly well.  He continues to tolerate Jevtana with slightly increased complications. He is reporting more fatigue as well as increased nausea. Zofran is still controlling the nausea quite well. He complains of issues with occasional constipation. This is likely multifocal, secondary to his chemotherapy as well as his pain medication. He denies  any neuropathy or infusion related complications.He does not report any abdominal pain or vomiting. He continues to have some lower extremity edema which has not changed.  His quality of life, although slowly declining, but relatively stable overall.  Had not reported any genitourinary complaints or bleeding. He reports some burning in his groin area particularly with walking, that is been present for a long period of time. He complains that the 5 mg oxycodone is not completely handling his pain/discomfort. Upon further questioning he will take 1 tablet but does not take any further even though he remains in pain and it is been at least 4 hours since the previous dose. He requests a refill for his oxycodone. He has not reported any new neurological complaints. Denied seizures or decline in his exercise tolerance. He has not reported any lymphadenopathy or pruritus. He has not reported any petechiae or bleeding.  Remainder of his review of systems unremarkable.     Medications: I have reviewed the patient's current medications. Current Outpatient Prescriptions  Medication Sig Dispense Refill  . amLODipine (NORVASC) 10 MG tablet Take 10 mg by mouth daily.    [name] aspirin 81 MG tablet Take 81 mg by mouth daily.      . calcium carbonate (OS-CAL) 1250 MG chewable tablet Chew 2 tablets by mouth daily.    [name] leuprolide (LUPRON) 30 MG injection Inject 30 mg into the muscle every 4 (four) months. LAST INJECTION JAN 2016    . lidocaine-prilocaine (EMLA) cream Apply topically as needed. 1 g 2  . losartan-hydrochlorothiazide (HYZAAR) 50-12.5  MG per tablet Take 1 tablet by mouth every evening.    . mirabegron ER (MYRBETRIQ) 50 MG TB24 tablet Take 50 mg by mouth every morning.    . ondansetron (ZOFRAN) 8 MG tablet Take 8 mg by mouth every 8 (eight) hours as needed for nausea or vomiting.    [name] oxyCODONE (OXY IR/ROXICODONE) 5 MG immediate release tablet Take 1 tablet (5 mg total) by mouth every 4 (four) hours as needed  for severe pain. 30 tablet 0   No current facility-administered medications for this visit.   Facility-Administered Medications Ordered in Other Visits  Medication Dose Route Frequency Provider Last Rate Last Dose  . cabazitaxel (JEVTANA) 46 mg in dextrose 5 % 250 mL chemo infusion  20 mg/m2 (Treatment Plan Actual) Intravenous Once [name], MD      . famotidine (PEPCID) IVPB 20 mg  20 mg Intravenous Once [name], MD      . heparin lock flush 100 unit/mL  500 Units Intracatheter Once PRN [name], MD      . sodium chloride 0.9 % injection 10 mL  10 mL Intracatheter PRN [name], MD        Allergies: No Known Allergies  Past Medical History, Surgical history, Social history, and Family History were reviewed and updated.    Physical Exam: Blood pressure 125/63, pulse 73, temperature 98.9 F (37.2 C), temperature source Oral, resp. rate 18, height 5\' 8"  (1.727 m), weight 225 lb 9.6 oz (102.331 kg), SpO2 97 %. ECOG: 1 General appearance: alert awake appeared in no active distress. Head: Normocephalic, without obvious abnormality, atraumatic Neck: no adenopathy Lymph nodes: Cervical, supraclavicular, and axillary nodes normal. Heart:regular rate and rhythm, S1, S2 normal, no murmur, click, rub or gallop Lung:chest clear, no wheezing, rales, normal symmetric air entry. Chest wall examination: Right anterior chest porta cath, well healed. Abdomen: soft, non-tender, without masses or organomegaly EXT:1+ edema which is unchanged.   Lab Results: Lab Results  Component Value Date   WBC 11.2* 01/30/2015   HGB 9.8* 01/30/2015   HCT 32.1* 01/30/2015   MCV 83.6 01/30/2015   PLT 210 01/30/2015     Chemistry      Component Value Date/Time   NA 141 01/30/2015 0845   NA 138 06/24/2014 0729   K 3.8 01/30/2015 0845   K 3.9 06/24/2014 0729   CL 104 02/06/2014 1005   CL 108* 03/09/2013 0923   CO2 21* 01/30/2015 0845   CO2 26 02/06/2014 1005   BUN 19.9 01/30/2015  0845   BUN 12 02/06/2014 1005   CREATININE 0.8 01/30/2015 0845   CREATININE 0.89 02/06/2014 1005      Component Value Date/Time   CALCIUM 8.5 01/30/2015 0845   CALCIUM 9.4 02/06/2014 1005   ALKPHOS 61 01/30/2015 0845   ALKPHOS 71 12/27/2012 0415   AST 13 01/30/2015 0845   AST 17 12/27/2012 0415   ALT 12 01/30/2015 0845   ALT 24 12/27/2012 0415   BILITOT 0.27 01/30/2015 0845   BILITOT 0.3 12/27/2012 0415       Results for [name], [name] (MRN [id number]) as of 01/09/2015 08:09  Ref. Range 11/27/2014 09:21 12/19/2014 09:25 01/08/2015 08:28  PSA Latest Range: <=4.00 ng/mL 832.80 (H) 858.90 (H) 966.50 (H)     RADIOLOGY STUDIES:  Ct Abdomen Pelvis W Contrast  01/08/2015   CLINICAL DATA:  69 year old male with history of prostate cancer. Originally diagnosed in 2001 with bone metastases. Pelvic pain since 11/28/2014,  worsening now. Status post prostatectomy.  EXAM: CT ABDOMEN AND PELVIS WITH CONTRAST  TECHNIQUE: Multidetector CT imaging of the abdomen and pelvis was performed using the standard protocol following bolus administration of intravenous contrast.  CONTRAST:  13mL OMNIPAQUE IOHEXOL 300 MG/ML  SOLN  COMPARISON:  Multiple priors, most recently 06/04/2014.  FINDINGS: Lower chest:  Unremarkable.  Hepatobiliary: No cystic or solid hepatic lesions. No intra or extrahepatic biliary ductal dilatation. Gallbladder is normal in appearance.  Pancreas: Unremarkable.  Spleen: Unremarkable.  Adrenals/Urinary Tract: Right-sided double-J ureteral stent appears stable in position with proximal loop reformed in the right renal pelvis and distal loop reformed in the urinary bladder. There continues to be some very mild right-sided hydroureteronephrosis, however, this has decreased compared to the prior examination. Several low-attenuation lesions in the kidneys bilaterally, many of which are too small to definitively characterize, the largest of these are compatible with cysts, with the largest  lesion measuring 3.3 x 2.0 cm in the lower pole of the left kidney. Additionally, in the upper pole of the left kidney there is an exophytic 4.4 x 4.2 cm enhancing renal mass, which is slightly larger than the prior examination (previously 3.7 x 3.9 cm). Urinary bladder is nearly completely decompressed, but otherwise unremarkable in appearance.  Stomach/Bowel: The appearance of the stomach is normal. No pathologic dilatation of small bowel or colon. Normal appendix.  Vascular/Lymphatic: Progressive worsening lymphadenopathy throughout the abdomen and pelvis. Multiple previously noted enlarged mesorectal lymph nodes have significantly increased in size, with the largest node currently measuring up to 2.5 cm in short axis on image 61 of series 2 (previously 2.2 cm). Worsening common iliac lymphadenopathy, with the largest lymph node measuring 3.4 x 3.9 cm on image 44 of series 2 (previously 3.5 x 2.8 cm). Very large retroperitoneal nodal mass, significantly more bulky than the prior examination, currently measuring up to 15.1 x 8.6 cm (measured in a different position than the prior study such that today's increased measurement partially reflects difference in measurement technique, but does encompass the largest bulk of the mass). Increasing hepatoduodenal ligament lymphadenopathy, with the largest node measuring 6.1 x 4.9 cm on image 23 of series 2 (previously 5.6 x 4.5 cm). Multiple other enlarged retroperitoneal and upper abdominal lymph nodes generally appear larger than the prior examination, compatible with progressively worsening metastatic disease. Atherosclerosis throughout the abdominal and pelvic vasculature, without evidence of aneurysm. Retroperitoneal lymphadenopathy is partially encasing the celiac axis and its branches, SMA, and IMA, however, these all appear patent at this time. Notably, the adenopathy is also encasing the left renal vein, which is very difficult to visualize on today's examination,  nearly completely compressed/occluded. Renal arteries are partially encased bilaterally (right greater than left), but remain patent at this time. Inferior vena cava is encased by the mass, and difficult to visualize (likely significantly stenosed but not yet completely occluded).  Reproductive: Status post radical prostatectomy. Penile prosthesis with pump/reservoir lower left hemipelvis.  Other: No significant volume of ascites.  No pneumoperitoneum.  Musculoskeletal: Several small scattered sclerotic lesions noted in the visualized axial and appendicular skeleton, presumably osseous metastasis, the largest of which is in the right ilium immediately superior to the right acetabulum, measuring 2.4 x 1.8 cm (similar to prior examinations).  IMPRESSION: 1. Today's study demonstrates progressively worsening lymph node metastasis throughout the abdomen and pelvis, as detailed above. At this point, the retroperitoneal lymphadenopathy is encasing and narrowing many vascular structures, most notable for left renal vein encasement, which causes either high-grade  stenosis or complete occlusion of the vein at this time. The left kidney appears to enhance normally at this time, indicative of adequate collateral venous outflow. 2. Slight interval enlargement of exophytic enhancing upper pole left renal mass, which remains suspicious for a renal cell carcinoma. 3. Right-sided double-J ureteral stent is stable in position. Previously noted right-sided hydroureteronephrosis has decreased slightly compared to the prior exam of the. 4. Additional incidental findings, as above.   Electronically Signed   By: Vinnie Langton [name]   On: 01/08/2015 10:36    Impression and Plan:  This is a 69 year old gentleman with the following issues: 1. Castration-resistant prostate cancer with pelvic adenopathy. He is now on systemic chemotherapy in the form Jevtana  with Neulasta support. CT scan results from 01/08/2015 as well as his PSA of  966 were discussed today. He has some element of progression of disease based on CT scan and PSA criteria. Options of treatments were discussed today extensively with the patient. I offered him best supportive care versus continuing Jevtana chemotherapy versus different chemotherapy agents. He has been on extensive treatments in the past and the only other option that he has not used his mitoxantrone. The efficacy of this drug has been very limited in this particular setting. After discussing the risks and benefits of all these approaches we have elected to proceed with [facility] for a few more cycles at a lower dose of 20 mg/m for better tolerance. He understands that his disease is becoming refractory to treatment and eventually will take over his body and will continue to decline. He understand we are dealing with incurable malignancy at this time and we'll try to palliate it as much as possible. 2. IV access: Status post right anterior chest Port-A-Cath placement on 02/06/2014. Continue EMLA cream as needed. No complications at this time. 3. Hydronephrosis: He is S/P stent placement. This was replaced recently. 4. Hormonal deprivation.  He continues to be on Lupron. Last given October 17, 2014  and will be repeated in May 2016. 5. 3.8 cm enhancing mass in the upper pole of the left kidney likely renal cell carcinoma. We will continue observation. Given his advanced prostate malignancy I see no need for immediate intervention. 6. Directed therapy: He is currently on Xgeva started on 12/19/2014 and that will be repeated on 01/30/2015 and every 6 week.  7. Pain the pelvic area: Could be related to his malignancy. I reviewed the instructions for his oxycodone tablets, 5 mg by mouth every 4 hours as needed for pain. He was given a prescription for another 30 tablets with no refill.  8. Loose stool: Patient was advised to try Imodium for this issue. 9. Followup will be in in 3 week for the start of cycle 13  Jevtana chemotherapy.    [name] Rappa E, PA-C  4/21/201610:30 AM

## 2015-01-30 NOTE — Patient Instructions (Signed)
Take your pain medication as prescribed when needed Follow-up in 3 weeks prior to your next scheduled cycle of chemotherapy

## 2015-01-30 NOTE — Patient Instructions (Signed)

## 2015-01-31 ENCOUNTER — Ambulatory Visit (HOSPITAL_BASED_OUTPATIENT_CLINIC_OR_DEPARTMENT_OTHER): Payer: Medicare Other

## 2015-01-31 VITALS — BP 120/63 | HR 70 | Temp 98.2°F

## 2015-01-31 DIAGNOSIS — Z5189 Encounter for other specified aftercare: Secondary | ICD-10-CM | POA: Diagnosis not present

## 2015-01-31 DIAGNOSIS — C7951 Secondary malignant neoplasm of bone: Secondary | ICD-10-CM

## 2015-01-31 DIAGNOSIS — C61 Malignant neoplasm of prostate: Secondary | ICD-10-CM

## 2015-01-31 LAB — PSA: PSA: 945.4 ng/mL — ABNORMAL HIGH (ref <=4.00)

## 2015-01-31 MED ORDER — PEGFILGRASTIM INJECTION 6 MG/0.6ML ~~LOC~~
6.0000 mg | PREFILLED_SYRINGE | Freq: Once | SUBCUTANEOUS | Status: AC
  Administered 2015-01-31: 6 mg via SUBCUTANEOUS
  Filled 2015-01-31: qty 0.6

## 2015-02-07 ENCOUNTER — Encounter: Payer: Self-pay | Admitting: Dietician

## 2015-02-07 NOTE — Progress Notes (Signed)
Patient identified to be at risk for malnutrition on the MST secondary to Weight loss and poor appetite  Contacted Pt by Phone, Spoke with wife  Wt Readings from Last 10 Encounters:  01/30/15 225 lb 9.6 oz (102.331 kg)  01/09/15 228 lb 14.4 oz (103.828 kg)  12/19/14 230 lb 4.8 oz (104.463 kg)  11/28/14 230 lb 9.6 oz (104.599 kg)  11/22/14 228 lb (103.42 kg)  11/07/14 233 lb 8 oz (105.915 kg)  10/17/14 234 lb 11.2 oz (106.459 kg)  09/26/14 238 lb 8 oz (108.183 kg)  09/04/14 233 lb 6.4 oz (105.87 kg)  08/14/14 234 lb 4.8 oz (106.278 kg)   Patient weight has decreased by about 10 pounds in 3 months.  Patient's wife reports oral intake as fair, but pt is suffering from symptoms including constipation, fatigue, and nausea (though not recently).  Spoke with wife. She said that she has been making sure that pt is eating 3 meals a day. She says he will eat, but he just doesn't eat his vegetables and tends to like the less healthy options.  I told her that is okay and that at this time it is important that he is eating high protein options. Discussed meat, PB, cheese, cottage cheese eggs. She requested a list.    Also talked about importance of hydration. Discussed tips to stay hydrated as it may help with constipation. She reported patient often goes to sauna and hot tub at gym.  Wife says all pt drinks is water and he has been good with that.  Pt was very receptive to what I had to say and thankful for the sent materials  Mailed my contact info, coupons, and handouts titled "Constipation", "Making the Most of Each Bite" and "Soft and Moist High-Protein Menu Ideas".   Burtis Junes RD, LDN Nutrition Pager: [phone] 02/07/2015 12:31 PM

## 2015-02-19 ENCOUNTER — Encounter: Payer: Self-pay | Admitting: Skilled Nursing Facility1

## 2015-02-19 NOTE — Progress Notes (Signed)
Subjective:     Patient ID: [name] Re., male   DOB: [birth year], 69 y.o.   MRN: [id number]  HPI   Review of Systems     Objective:   Physical Exam To help the pt identify some dietary strategies to gain some lost wt back.    Assessment:     Pt identified as being malnourished due to some lost wt. Pt contacted pt via [phone]. Pt was unavailable.    Plan:     Dietitian left a voicemail prompting the pt to contact [place].

## 2015-02-20 ENCOUNTER — Ambulatory Visit: Payer: Medicare Other

## 2015-02-20 ENCOUNTER — Ambulatory Visit (HOSPITAL_BASED_OUTPATIENT_CLINIC_OR_DEPARTMENT_OTHER): Payer: Medicare Other

## 2015-02-20 ENCOUNTER — Telehealth: Payer: Self-pay | Admitting: Oncology

## 2015-02-20 ENCOUNTER — Other Ambulatory Visit (HOSPITAL_BASED_OUTPATIENT_CLINIC_OR_DEPARTMENT_OTHER): Payer: Medicare Other

## 2015-02-20 ENCOUNTER — Telehealth: Payer: Self-pay | Admitting: *Deleted

## 2015-02-20 ENCOUNTER — Ambulatory Visit (HOSPITAL_BASED_OUTPATIENT_CLINIC_OR_DEPARTMENT_OTHER): Payer: Medicare Other | Admitting: Oncology

## 2015-02-20 VITALS — BP 111/63 | HR 67 | Temp 98.3°F | Resp 17 | Ht 66.0 in | Wt 224.4 lb

## 2015-02-20 DIAGNOSIS — Z95828 Presence of other vascular implants and grafts: Secondary | ICD-10-CM

## 2015-02-20 DIAGNOSIS — C61 Malignant neoplasm of prostate: Secondary | ICD-10-CM

## 2015-02-20 DIAGNOSIS — C7951 Secondary malignant neoplasm of bone: Secondary | ICD-10-CM

## 2015-02-20 DIAGNOSIS — Z5111 Encounter for antineoplastic chemotherapy: Secondary | ICD-10-CM

## 2015-02-20 LAB — CBC WITH DIFFERENTIAL/PLATELET
BASO%: 0.2 % (ref 0.0–2.0)
BASOS ABS: 0 10*3/uL (ref 0.0–0.1)
EOS%: 2.1 % (ref 0.0–7.0)
Eosinophils Absolute: 0.1 10*3/uL (ref 0.0–0.5)
HEMATOCRIT: 31.4 % — AB (ref 38.4–49.9)
HEMOGLOBIN: 10.2 g/dL — AB (ref 13.0–17.1)
LYMPH%: 20.1 % (ref 14.0–49.0)
MCH: 26.9 pg — AB (ref 27.2–33.4)
MCHC: 32.4 g/dL (ref 32.0–36.0)
MCV: 82.9 fL (ref 79.3–98.0)
MONO#: 0.5 10*3/uL (ref 0.1–0.9)
MONO%: 8.3 % (ref 0.0–14.0)
NEUT#: 4 10*3/uL (ref 1.5–6.5)
NEUT%: 69.3 % (ref 39.0–75.0)
PLATELETS: 202 10*3/uL (ref 140–400)
RBC: 3.78 10*6/uL — ABNORMAL LOW (ref 4.20–5.82)
RDW: 17.3 % — AB (ref 11.0–14.6)
WBC: 5.8 10*3/uL (ref 4.0–10.3)
lymph#: 1.2 10*3/uL (ref 0.9–3.3)

## 2015-02-20 LAB — COMPREHENSIVE METABOLIC PANEL (CC13)
ALBUMIN: 3.6 g/dL (ref 3.5–5.0)
ALT: 10 U/L (ref 0–55)
ANION GAP: 11 meq/L (ref 3–11)
AST: 13 U/L (ref 5–34)
Alkaline Phosphatase: 58 U/L (ref 40–150)
BUN: 16.6 mg/dL (ref 7.0–26.0)
CHLORIDE: 106 meq/L (ref 98–109)
CO2: 24 mEq/L (ref 22–29)
CREATININE: 0.9 mg/dL (ref 0.7–1.3)
Calcium: 8.1 mg/dL — ABNORMAL LOW (ref 8.4–10.4)
EGFR: >90 mL/min/{1.73_m2} (ref >90)
Glucose: 122 mg/dl (ref 70–140)
Potassium: 3.9 mEq/L (ref 3.5–5.1)
Sodium: 141 mEq/L (ref 136–145)
Total Bilirubin: 0.39 mg/dL (ref 0.20–1.20)
Total Protein: 6.8 g/dL (ref 6.4–8.3)

## 2015-02-20 MED ORDER — FAMOTIDINE IN NACL 20-0.9 MG/50ML-% IV SOLN
20.0000 mg | Freq: Once | INTRAVENOUS | Status: AC
  Administered 2015-02-20: 20 mg via INTRAVENOUS

## 2015-02-20 MED ORDER — DIPHENHYDRAMINE HCL 50 MG/ML IJ SOLN
25.0000 mg | Freq: Once | INTRAMUSCULAR | Status: AC
  Administered 2015-02-20: 25 mg via INTRAVENOUS

## 2015-02-20 MED ORDER — DIPHENHYDRAMINE HCL 50 MG/ML IJ SOLN
INTRAMUSCULAR | Status: AC
  Filled 2015-02-20: qty 1

## 2015-02-20 MED ORDER — SODIUM CHLORIDE 0.9 % IV SOLN
Freq: Once | INTRAVENOUS | Status: AC
  Administered 2015-02-20: 10:00:00 via INTRAVENOUS
  Filled 2015-02-20: qty 4

## 2015-02-20 MED ORDER — HEPARIN SOD (PORK) LOCK FLUSH 100 UNIT/ML IV SOLN
500.0000 [IU] | Freq: Once | INTRAVENOUS | Status: AC | PRN
  Administered 2015-02-20: 500 [IU]
  Filled 2015-02-20: qty 5

## 2015-02-20 MED ORDER — FAMOTIDINE IN NACL 20-0.9 MG/50ML-% IV SOLN
INTRAVENOUS | Status: AC
  Filled 2015-02-20: qty 50

## 2015-02-20 MED ORDER — DEXTROSE 5 % IV SOLN
20.0000 mg/m2 | Freq: Once | INTRAVENOUS | Status: AC
  Administered 2015-02-20: 46 mg via INTRAVENOUS
  Filled 2015-02-20: qty 4.6

## 2015-02-20 MED ORDER — SODIUM CHLORIDE 0.9 % IJ SOLN
10.0000 mL | INTRAMUSCULAR | Status: DC | PRN
  Administered 2015-02-20: 10 mL via INTRAVENOUS
  Filled 2015-02-20: qty 10

## 2015-02-20 MED ORDER — SODIUM CHLORIDE 0.9 % IJ SOLN
10.0000 mL | INTRAMUSCULAR | Status: DC | PRN
  Administered 2015-02-20: 10 mL
  Filled 2015-02-20: qty 10

## 2015-02-20 MED ORDER — SODIUM CHLORIDE 0.9 % IV SOLN
Freq: Once | INTRAVENOUS | Status: AC
  Administered 2015-02-20: 10:00:00 via INTRAVENOUS

## 2015-02-20 MED ORDER — LEUPROLIDE ACETATE (4 MONTH) 30 MG IM KIT
30.0000 mg | PACK | Freq: Once | INTRAMUSCULAR | Status: AC
  Administered 2015-02-20: 30 mg via INTRAMUSCULAR
  Filled 2015-02-20: qty 30

## 2015-02-20 NOTE — Progress Notes (Signed)
Hematology and Oncology Follow Up Visit  [name] [id number] [birth year] 69 y.o. 02/20/2015 9:01 AM   [name], [name]  [name], [name]  Principle Diagnosis:This is a 69 year old gentleman with prostate cancer initially diagnosed in 2001.  He had a Gleason score of 3 + 3 = 6.  He has castration-resistant disease with pelvic adenopathy   Prior Therapy:  1. Status post prostatectomy done in May 2001.  He had T3 N1 disease.  PSA nadir to 0.  2. The patient developed biochemical relapse and received prostatic bed radiation.  3. The patient received Lupron with Casodex due to rising PSA.  The patient subsequently developed castration-resistant disease. 4. Patient treated with Casodex withdrawal and subsequently PSA rose to 18. 5. Patient with Provenge immunotherapy completed in July 2011. 6.  He received ketoconazole and prednisone December 2011 through January 2014. 7.         He is S/P Zytiga 1000 mg daily with Prednisone 5 mg daily on 11/03/12 till 07/2013. This was stopped due to progression of disease.  8.  Xtandi 1000 mg daily 07/2013 through 01/24/2014. Discontinued secondary to disease progression 9.         Docetaxel 75 mg/m2 with Neulasta support, status post 5 cycles. Discontinued 06/05/2014 secondary to disease progression.  Current therapy: 1. Systemic chemotherapy with Jevtana 25 mg/m2 given every 3 weeks. This was dose reduced to 20 mg started on cycle 12. Status post 12 cycles. 2. He is on Lupron 30 mg every 4 months. Last time given on 10/17/2014  Interim History: [name] presents today for a followup visit with his wife. Since the last visit, he reports doing fairly well.  He continues to tolerate Jevtana  without complications. He does report occasional constipation been managed reasonably well at this time. He denies any neuropathy or infusion related complications.He does not report any abdominal pain or vomiting. He continues to have some lower  extremity edema which has not changed.  His quality of life, although slowly declining, but relatively stable overall. He was evaluated at [facility] for precision medicine program. He underwent a lymph node biopsy and sent for [facility] evaluation.  Had not reported any genitourinary complaints or bleeding. He reports some burning in his groin area particularly with walking, that is been present for a long period of time. He complains that the 5 mg oxycodone is not completely handling his pain/discomfort. Upon further questioning he will take 1 tablet but does not take any further even though he remains in pain and it is been at least 4 hours since the previous dose. He requests a refill for his oxycodone. He has not reported any new neurological complaints. Denied seizures or decline in his exercise tolerance. He has not reported any lymphadenopathy or pruritus. He has not reported any petechiae or bleeding.  Remainder of his review of systems unremarkable.     Medications: I have reviewed the patient's current medications. Current Outpatient Prescriptions  Medication Sig Dispense Refill  . amLODipine (NORVASC) 10 MG tablet Take 10 mg by mouth daily.    [name] aspirin 81 MG tablet Take 81 mg by mouth daily.      . calcium carbonate (OS-CAL) 1250 MG chewable tablet Chew 2 tablets by mouth daily.    [name] leuprolide (LUPRON) 30 MG injection Inject 30 mg into the muscle every 4 (four) months. LAST INJECTION JAN 2016    . lidocaine-prilocaine (EMLA) cream Apply topically as needed.  1 g 2  . losartan-hydrochlorothiazide (HYZAAR) 50-12.5 MG per tablet Take 1 tablet by mouth every evening.    . mirabegron ER (MYRBETRIQ) 50 MG TB24 tablet Take 50 mg by mouth every morning.    . ondansetron (ZOFRAN) 8 MG tablet Take 8 mg by mouth every 8 (eight) hours as needed for nausea or vomiting.    [name] oxyCODONE (OXY IR/ROXICODONE) 5 MG immediate release tablet Take 1 tablet (5 mg total) by  mouth every 4 (four) hours as needed for severe pain. 30 tablet 0   No current facility-administered medications for this visit.   Facility-Administered Medications Ordered in Other Visits  Medication Dose Route Frequency Provider Last Rate Last Dose  . sodium chloride 0.9 % injection 10 mL  10 mL Intravenous PRN [name], MD   10 mL at 02/20/15 8938    Allergies: No Known Allergies  Past Medical History, Surgical history, Social history, and Family History were reviewed and updated.    Physical Exam: Blood pressure 111/63, pulse 67, temperature 98.3 F (36.8 C), temperature source Oral, resp. rate 17, height 5\' 6"  (1.676 m), weight 224 lb 6.4 oz (101.787 kg), SpO2 100 %. ECOG: 1 General appearance: alert awake appeared in no active distress. Head: Normocephalic, without obvious abnormality, atraumatic Neck: no adenopathy Lymph nodes: Cervical, supraclavicular, and axillary nodes normal. Heart:regular rate and rhythm, S1, S2 normal, no murmur, click, rub or gallop Lung:chest clear, no wheezing, rales, normal symmetric air entry. Chest wall examination: Right anterior chest porta cath, well healed. Abdomen: soft, non-tender, without masses or organomegaly EXT:1+ edema which is unchanged.   Lab Results: Lab Results  Component Value Date   WBC 5.8 02/20/2015   HGB 10.2* 02/20/2015   HCT 31.4* 02/20/2015   MCV 82.9 02/20/2015   PLT 202 02/20/2015     Chemistry      Component Value Date/Time   NA 141 01/30/2015 0845   NA 138 06/24/2014 0729   K 3.8 01/30/2015 0845   K 3.9 06/24/2014 0729   CL 104 02/06/2014 1005   CL 108* 03/09/2013 0923   CO2 21* 01/30/2015 0845   CO2 26 02/06/2014 1005   BUN 19.9 01/30/2015 0845   BUN 12 02/06/2014 1005   CREATININE 0.8 01/30/2015 0845   CREATININE 0.89 02/06/2014 1005      Component Value Date/Time   CALCIUM 8.5 01/30/2015 0845   CALCIUM 9.4 02/06/2014 1005   ALKPHOS 61 01/30/2015 0845   ALKPHOS 71 12/27/2012 0415   AST  13 01/30/2015 0845   AST 17 12/27/2012 0415   ALT 12 01/30/2015 0845   ALT 24 12/27/2012 0415   BILITOT 0.27 01/30/2015 0845   BILITOT 0.3 12/27/2012 0415       Results for [name], [name] (MRN [id number]) as of 02/20/2015 08:50  Ref. Range 01/08/2015 08:28 01/30/2015 08:45  PSA Latest Ref Range: <=4.00 ng/mL 966.50 (H) 945.40 (H)       Impression and Plan:  This is a 69 year old gentleman with the following issues: 1. Castration-resistant prostate cancer with pelvic adenopathy. He is now on systemic chemotherapy in the form Jevtana  with Neulasta support. CT scan results from 01/08/2015 shows slight progression of disease but he certainly have exhausted all other treatment options. Jevtana have reasonably palliated his symptoms and have elected to continue on it for the time being and a reduced dose. The plan is to continue with the current dose and schedule to other options are available. He is awaiting the  biopsy results from [facility] medicine testing and if he does have a mutation that is targetable, we will switch him accordingly. 2. IV access: Status post right anterior chest Port-A-Cath placement on 02/06/2014. Continue EMLA cream as needed. No complications at this time. 3. Hydronephrosis: He is S/P stent placement. This was replaced recently. 4. Hormonal deprivation.  He continues to be on Lupron. Last given October 17, 2014  and will be repeated on 02/20/2015 and 4 months after that.  5. 3.8 cm enhancing mass in the upper pole of the left kidney likely renal cell carcinoma. We will continue observation. Given his advanced prostate malignancy I see no need for immediate intervention. 6. Directed therapy: He is currently on Xgeva started on 12/19/2014 and that will be repeated on 01/30/2015 and every 6 week.  7. Pain the pelvic area: controlled at this time.  8. Loose stool: Patient was advised to try Imodium for this issue. 9. Followup will be in in 3 week for the start of  cycle 14 Jevtana chemotherapy.    [facility], MD 5/12/20169:01 AM

## 2015-02-20 NOTE — Patient Instructions (Addendum)
[name] Discharge Instructions for Patients Receiving Chemotherapy  Today you received the following chemotherapy agents: Jevtana.   To help prevent nausea and vomiting after your treatment, we encourage you to take your nausea medication as directed.    If you develop nausea and vomiting that is not controlled by your nausea medication, call the clinic.   BELOW ARE SYMPTOMS THAT SHOULD BE REPORTED IMMEDIATELY:  *FEVER GREATER THAN 100.5 F  *CHILLS WITH OR WITHOUT FEVER  NAUSEA AND VOMITING THAT IS NOT CONTROLLED WITH YOUR NAUSEA MEDICATION  *UNUSUAL SHORTNESS OF BREATH  *UNUSUAL BRUISING OR BLEEDING  TENDERNESS IN MOUTH AND THROAT WITH OR WITHOUT PRESENCE OF ULCERS  *URINARY PROBLEMS  *BOWEL PROBLEMS  UNUSUAL RASH Items with * indicate a potential emergency and should be followed up as soon as possible.  Feel free to call the clinic you have any questions or concerns. The clinic phone number is [phone].  Please show the [place] at check-in to the Emergency Department and triage nurse.  Leuprolide injection What is this medicine? LEUPROLIDE (loo PROE lide) is a man-made hormone. It is used to treat the symptoms of prostate cancer. This medicine may also be used to treat children with early onset of puberty. It may be used for other hormonal conditions. This medicine may be used for other purposes; ask your health care provider or pharmacist if you have questions. COMMON BRAND NAME(S): Lupron What should I tell my health care provider before I take this medicine? They need to know if you have any of these conditions: -diabetes -heart disease or previous heart attack -high blood pressure -high cholesterol -pain or difficulty passing urine -spinal cord metastasis -stroke -tobacco smoker -an unusual or allergic reaction to leuprolide, benzyl alcohol, other medicines, foods, dyes, or preservatives -pregnant or trying to get  pregnant -breast-feeding How should I use this medicine? This medicine is for injection under the skin or into a muscle. You will be taught how to prepare and give this medicine. Use exactly as directed. Take your medicine at regular intervals. Do not take your medicine more often than directed. It is important that you put your used needles and syringes in a special sharps container. Do not put them in a trash can. If you do not have a sharps container, call your pharmacist or healthcare provider to get one. Talk to your pediatrician regarding the use of this medicine in children. While this medicine may be prescribed for children as young as 8 years for selected conditions, precautions do apply. Overdosage: If you think you have taken too much of this medicine contact a poison control center or emergency room at once. NOTE: This medicine is only for you. Do not share this medicine with others. What if I miss a dose? If you miss a dose, take it as soon as you can. If it is almost time for your next dose, take only that dose. Do not take double or extra doses. What may interact with this medicine? Do not take this medicine with any of the following medications: -chasteberry This medicine may also interact with the following medications: -herbal or dietary supplements, like black cohosh or DHEA -male hormones, like estrogens or progestins and birth control pills, patches, rings, or injections -male hormones, like testosterone This list may not describe all possible interactions. Give your health care provider a list of all the medicines, herbs, non-prescription drugs, or dietary supplements you use. Also tell them if you smoke, drink alcohol,  or use illegal drugs. Some items may interact with your medicine. What should I watch for while using this medicine? Visit your doctor or health care professional for regular checks on your progress. During the first week, your symptoms may get worse, but  then will improve as you continue your treatment. You may get hot flashes, increased bone pain, increased difficulty passing urine, or an aggravation of nerve symptoms. Discuss these effects with your doctor or health care professional, some of them may improve with continued use of this medicine. Male patients may experience a menstrual cycle or spotting during the first 2 months of therapy with this medicine. If this continues, contact your doctor or health care professional. What side effects may I notice from receiving this medicine? Side effects that you should report to your doctor or health care professional as soon as possible: -allergic reactions like skin rash, itching or hives, swelling of the face, lips, or tongue -breathing problems -chest pain -depression or memory disorders -pain in your legs or groin -pain at site where injected -severe headache -swelling of the feet and legs -visual changes -vomiting Side effects that usually do not require medical attention (report to your doctor or health care professional if they continue or are bothersome): -breast swelling or tenderness -decrease in sex drive or performance -diarrhea -hot flashes -loss of appetite -muscle, joint, or bone pains -nausea -redness or irritation at site where injected -skin problems or acne This list may not describe all possible side effects. Call your doctor for medical advice about side effects. You may report side effects to FDA at 1-800-FDA-1088. Where should I keep my medicine? Keep out of the reach of children. Store below 25 degrees C (77 degrees F). Do not freeze. Protect from light. Do not use if it is not clear or if there are particles present. Throw away any unused medicine after the expiration date. NOTE: This sheet is a summary. It may not cover all possible information. If you have questions about this medicine, talk to your doctor, pharmacist, or health care provider.  2015,  Elsevier/Gold Standard. (2009-02-11 13:26:20)

## 2015-02-20 NOTE — Telephone Encounter (Signed)
Per staff message and POF I have scheduled appts. Advised scheduler of appts. JMW  

## 2015-02-20 NOTE — Patient Instructions (Signed)

## 2015-02-20 NOTE — Telephone Encounter (Signed)
Pt confirmed labs/ov per 05/12 POF, gave pt AVS and Calendar.... KJ, sent msg to add chemo °

## 2015-02-21 ENCOUNTER — Ambulatory Visit (HOSPITAL_BASED_OUTPATIENT_CLINIC_OR_DEPARTMENT_OTHER): Payer: Medicare Other

## 2015-02-21 VITALS — BP 124/71 | HR 71 | Temp 98.4°F | Resp 18

## 2015-02-21 DIAGNOSIS — Z5189 Encounter for other specified aftercare: Secondary | ICD-10-CM

## 2015-02-21 DIAGNOSIS — C61 Malignant neoplasm of prostate: Secondary | ICD-10-CM

## 2015-02-21 LAB — PSA: PSA: 1012 ng/mL — ABNORMAL HIGH (ref <=4.00)

## 2015-02-21 MED ORDER — PEGFILGRASTIM INJECTION 6 MG/0.6ML ~~LOC~~
6.0000 mg | PREFILLED_SYRINGE | Freq: Once | SUBCUTANEOUS | Status: AC
  Administered 2015-02-21: 6 mg via SUBCUTANEOUS
  Filled 2015-02-21: qty 0.6

## 2015-02-21 NOTE — Patient Instructions (Signed)
Pegfilgrastim injection What is this medicine? PEGFILGRASTIM (peg fil GRA stim) is a long-acting granulocyte colony-stimulating factor that stimulates the growth of neutrophils, a type of white blood cell important in the body's fight against infection. It is used to reduce the incidence of fever and infection in patients with certain types of cancer who are receiving chemotherapy that affects the bone marrow. This medicine may be used for other purposes; ask your health care provider or pharmacist if you have questions. COMMON BRAND NAME(S): Neulasta What should I tell my health care provider before I take this medicine? They need to know if you have any of these conditions: -latex allergy -ongoing radiation therapy -sickle cell disease -skin reactions to acrylic adhesives (On-Body Injector only) -an unusual or allergic reaction to pegfilgrastim, filgrastim, other medicines, foods, dyes, or preservatives -pregnant or trying to get pregnant -breast-feeding How should I use this medicine? This medicine is for injection under the skin. If you get this medicine at home, you will be taught how to prepare and give the pre-filled syringe or how to use the On-body Injector. Refer to the patient Instructions for Use for detailed instructions. Use exactly as directed. Take your medicine at regular intervals. Do not take your medicine more often than directed. It is important that you put your used needles and syringes in a special sharps container. Do not put them in a trash can. If you do not have a sharps container, call your pharmacist or healthcare provider to get one. Talk to your pediatrician regarding the use of this medicine in children. Special care may be needed. Overdosage: If you think you have taken too much of this medicine contact a poison control center or emergency room at once. NOTE: This medicine is only for you. Do not share this medicine with others. What if I miss a dose? It is  important not to miss your dose. Call your doctor or health care professional if you miss your dose. If you miss a dose due to an On-body Injector failure or leakage, a new dose should be administered as soon as possible using a single prefilled syringe for manual use. What may interact with this medicine? Interactions have not been studied. Give your health care provider a list of all the medicines, herbs, non-prescription drugs, or dietary supplements you use. Also tell them if you smoke, drink alcohol, or use illegal drugs. Some items may interact with your medicine. This list may not describe all possible interactions. Give your health care provider a list of all the medicines, herbs, non-prescription drugs, or dietary supplements you use. Also tell them if you smoke, drink alcohol, or use illegal drugs. Some items may interact with your medicine. What should I watch for while using this medicine? You may need blood work done while you are taking this medicine. If you are going to need a MRI, CT scan, or other procedure, tell your doctor that you are using this medicine (On-Body Injector only). What side effects may I notice from receiving this medicine? Side effects that you should report to your doctor or health care professional as soon as possible: -allergic reactions like skin rash, itching or hives, swelling of the face, lips, or tongue -dizziness -fever -pain, redness, or irritation at site where injected -pinpoint red spots on the skin -shortness of breath or breathing problems -stomach or side pain, or pain at the shoulder -swelling -tiredness -trouble passing urine Side effects that usually do not require medical attention (report to your doctor   or health care professional if they continue or are bothersome): -bone pain -muscle pain This list may not describe all possible side effects. Call your doctor for medical advice about side effects. You may report side effects to FDA at  1-800-FDA-1088. Where should I keep my medicine? Keep out of the reach of children. Store pre-filled syringes in a refrigerator between 2 and 8 degrees C (36 and 46 degrees F). Do not freeze. Keep in carton to protect from light. Throw away this medicine if it is left out of the refrigerator for more than 48 hours. Throw away any unused medicine after the expiration date. NOTE: This sheet is a summary. It may not cover all possible information. If you have questions about this medicine, talk to your doctor, pharmacist, or health care provider.  2015, [employer]. (2013-12-27 16:14:05)  

## 2015-02-24 NOTE — Telephone Encounter (Signed)
Pt came by to p/u schedule with chemo added..... KJ

## 2015-03-13 ENCOUNTER — Encounter: Payer: Self-pay | Admitting: Physician Assistant

## 2015-03-13 ENCOUNTER — Ambulatory Visit: Payer: Medicare Other

## 2015-03-13 ENCOUNTER — Ambulatory Visit (HOSPITAL_BASED_OUTPATIENT_CLINIC_OR_DEPARTMENT_OTHER): Payer: Medicare Other | Admitting: Physician Assistant

## 2015-03-13 ENCOUNTER — Ambulatory Visit (HOSPITAL_BASED_OUTPATIENT_CLINIC_OR_DEPARTMENT_OTHER): Payer: Medicare Other

## 2015-03-13 ENCOUNTER — Other Ambulatory Visit (HOSPITAL_BASED_OUTPATIENT_CLINIC_OR_DEPARTMENT_OTHER): Payer: Medicare Other

## 2015-03-13 VITALS — BP 99/57 | HR 85 | Temp 98.1°F | Resp 18 | Ht 66.0 in | Wt 221.2 lb

## 2015-03-13 DIAGNOSIS — C7951 Secondary malignant neoplasm of bone: Secondary | ICD-10-CM | POA: Diagnosis not present

## 2015-03-13 DIAGNOSIS — R102 Pelvic and perineal pain: Secondary | ICD-10-CM

## 2015-03-13 DIAGNOSIS — R42 Dizziness and giddiness: Secondary | ICD-10-CM

## 2015-03-13 DIAGNOSIS — E291 Testicular hypofunction: Secondary | ICD-10-CM | POA: Diagnosis not present

## 2015-03-13 DIAGNOSIS — Z5111 Encounter for antineoplastic chemotherapy: Secondary | ICD-10-CM | POA: Diagnosis present

## 2015-03-13 DIAGNOSIS — K5900 Constipation, unspecified: Secondary | ICD-10-CM | POA: Diagnosis not present

## 2015-03-13 DIAGNOSIS — C61 Malignant neoplasm of prostate: Secondary | ICD-10-CM

## 2015-03-13 DIAGNOSIS — Z95828 Presence of other vascular implants and grafts: Secondary | ICD-10-CM

## 2015-03-13 LAB — CBC WITH DIFFERENTIAL/PLATELET
BASO%: 0.1 % (ref 0.0–2.0)
Basophils Absolute: 0 10*3/uL (ref 0.0–0.1)
EOS ABS: 0 10*3/uL (ref 0.0–0.5)
EOS%: 0.1 % (ref 0.0–7.0)
HCT: 28.2 % — ABNORMAL LOW (ref 38.4–49.9)
HEMOGLOBIN: 9.1 g/dL — AB (ref 13.0–17.1)
LYMPH%: 4.2 % — ABNORMAL LOW (ref 14.0–49.0)
MCH: 26.7 pg — ABNORMAL LOW (ref 27.2–33.4)
MCHC: 32.3 g/dL (ref 32.0–36.0)
MCV: 82.7 fL (ref 79.3–98.0)
MONO#: 0.9 10*3/uL (ref 0.1–0.9)
MONO%: 5 % (ref 0.0–14.0)
NEUT#: 15.8 10*3/uL — ABNORMAL HIGH (ref 1.5–6.5)
NEUT%: 90.6 % — ABNORMAL HIGH (ref 39.0–75.0)
Platelets: 186 10*3/uL (ref 140–400)
RBC: 3.41 10*6/uL — ABNORMAL LOW (ref 4.20–5.82)
RDW: 15.9 % — ABNORMAL HIGH (ref 11.0–14.6)
WBC: 17.4 10*3/uL — ABNORMAL HIGH (ref 4.0–10.3)
lymph#: 0.7 10*3/uL — ABNORMAL LOW (ref 0.9–3.3)

## 2015-03-13 LAB — COMPREHENSIVE METABOLIC PANEL (CC13)
ALK PHOS: 54 U/L (ref 40–150)
ALT: 8 U/L (ref 0–55)
AST: 13 U/L (ref 5–34)
Albumin: 3 g/dL — ABNORMAL LOW (ref 3.5–5.0)
Anion Gap: 8 mEq/L (ref 3–11)
BUN: 20.8 mg/dL (ref 7.0–26.0)
CO2: 25 meq/L (ref 22–29)
CREATININE: 1.1 mg/dL (ref 0.7–1.3)
Calcium: 7.9 mg/dL — ABNORMAL LOW (ref 8.4–10.4)
Chloride: 105 mEq/L (ref 98–109)
EGFR: 83 mL/min/{1.73_m2} — AB (ref >90)
Glucose: 109 mg/dl (ref 70–140)
Potassium: 3.6 mEq/L (ref 3.5–5.1)
SODIUM: 138 meq/L (ref 136–145)
Total Bilirubin: 0.59 mg/dL (ref 0.20–1.20)
Total Protein: 6.6 g/dL (ref 6.4–8.3)

## 2015-03-13 MED ORDER — HEPARIN SOD (PORK) LOCK FLUSH 100 UNIT/ML IV SOLN
500.0000 [IU] | Freq: Once | INTRAVENOUS | Status: AC | PRN
Start: 2015-03-13 — End: 2015-03-13
  Administered 2015-03-13: 500 [IU]
  Filled 2015-03-13: qty 5

## 2015-03-13 MED ORDER — ONDANSETRON HCL 8 MG PO TABS: 8.0000 mg | ORAL_TABLET | Freq: Three times a day (TID) | ORAL | Status: DC | PRN

## 2015-03-13 MED ORDER — DIPHENHYDRAMINE HCL 50 MG/ML IJ SOLN
25.0000 mg | Freq: Once | INTRAMUSCULAR | Status: AC
  Administered 2015-03-13: 25 mg via INTRAVENOUS

## 2015-03-13 MED ORDER — SODIUM CHLORIDE 0.9 % IJ SOLN
10.0000 mL | INTRAMUSCULAR | Status: DC | PRN
  Administered 2015-03-13: 10 mL via INTRAVENOUS
  Filled 2015-03-13: qty 10

## 2015-03-13 MED ORDER — DIPHENHYDRAMINE HCL 50 MG/ML IJ SOLN
INTRAMUSCULAR | Status: AC
  Filled 2015-03-13: qty 1

## 2015-03-13 MED ORDER — DEXTROSE 5 % IV SOLN
20.0000 mg/m2 | Freq: Once | INTRAVENOUS | Status: AC
  Administered 2015-03-13: 46 mg via INTRAVENOUS
  Filled 2015-03-13: qty 4.6

## 2015-03-13 MED ORDER — SODIUM CHLORIDE 0.9 % IV SOLN
Freq: Once | INTRAVENOUS | Status: AC
  Administered 2015-03-13: 10:00:00 via INTRAVENOUS

## 2015-03-13 MED ORDER — OXYCODONE HCL 5 MG PO TABS: 5.0000 mg | ORAL_TABLET | ORAL | Status: DC | PRN

## 2015-03-13 MED ORDER — DENOSUMAB 120 MG/1.7ML ~~LOC~~ SOLN
120.0000 mg | Freq: Once | SUBCUTANEOUS | Status: AC
  Administered 2015-03-13: 120 mg via SUBCUTANEOUS
  Filled 2015-03-13: qty 1.7

## 2015-03-13 MED ORDER — SODIUM CHLORIDE 0.9 % IJ SOLN
10.0000 mL | INTRAMUSCULAR | Status: DC | PRN
  Administered 2015-03-13: 10 mL
  Filled 2015-03-13: qty 10

## 2015-03-13 MED ORDER — SODIUM CHLORIDE 0.9 % IV SOLN
Freq: Once | INTRAVENOUS | Status: AC
  Administered 2015-03-13: 10:00:00 via INTRAVENOUS
  Filled 2015-03-13: qty 4

## 2015-03-13 MED ORDER — FAMOTIDINE IN NACL 20-0.9 MG/50ML-% IV SOLN
20.0000 mg | Freq: Once | INTRAVENOUS | Status: AC
  Administered 2015-03-13: 20 mg via INTRAVENOUS

## 2015-03-13 MED ORDER — FAMOTIDINE IN NACL 20-0.9 MG/50ML-% IV SOLN
INTRAVENOUS | Status: AC
  Filled 2015-03-13: qty 50

## 2015-03-13 NOTE — Patient Instructions (Addendum)
[facility] Discharge Instructions for Patients Receiving Chemotherapy  Today you received the following chemotherapy agents Jevtana/Xgeva.  To help prevent nausea and vomiting after your treatment, we encourage you to take your nausea medication as directed.   If you develop nausea and vomiting that is not controlled by your nausea medication, call the clinic.   BELOW ARE SYMPTOMS THAT SHOULD BE REPORTED IMMEDIATELY:  *FEVER GREATER THAN 100.5 F  *CHILLS WITH OR WITHOUT FEVER  NAUSEA AND VOMITING THAT IS NOT CONTROLLED WITH YOUR NAUSEA MEDICATION  *UNUSUAL SHORTNESS OF BREATH  *UNUSUAL BRUISING OR BLEEDING  TENDERNESS IN MOUTH AND THROAT WITH OR WITHOUT PRESENCE OF ULCERS  *URINARY PROBLEMS  *BOWEL PROBLEMS  UNUSUAL RASH Items with * indicate a potential emergency and should be followed up as soon as possible.  Feel free to call the clinic you have any questions or concerns. The clinic phone number is [phone].  Please show the [facility] at check-in to the Emergency Department and triage nurse.

## 2015-03-13 NOTE — Patient Instructions (Signed)

## 2015-03-13 NOTE — Progress Notes (Signed)
Hematology and Oncology Follow Up Visit  [name] [id number] [birth year] 69 y.o. 03/13/2015 9:30 AM   [name], [name]  [name] [name], [name]  Principle Diagnosis:This is a 69 year old gentleman with prostate cancer initially diagnosed in 2001.  He had a Gleason score of 3 + 3 = 6.  He has castration-resistant disease with pelvic adenopathy   Prior Therapy:  1. Status post prostatectomy done in May 2001.  He had T3 N1 disease.  PSA nadir to 0.  2. The patient developed biochemical relapse and received prostatic bed radiation.  3. The patient received Lupron with Casodex due to rising PSA.  The patient subsequently developed castration-resistant disease. 4. Patient treated with Casodex withdrawal and subsequently PSA rose to 18. 5. Patient with Provenge immunotherapy completed in July 2011. 6.  He received ketoconazole and prednisone December 2011 through January 2014. 7.         He is S/P Zytiga 1000 mg daily with Prednisone 5 mg daily on 11/03/12 till 07/2013. This was stopped due to progression of disease.  8.  Xtandi 1000 mg daily 07/2013 through 01/24/2014. Discontinued secondary to disease progression 9.         Docetaxel 75 mg/m2 with Neulasta support, status post 5 cycles. Discontinued 06/05/2014 secondary to disease progression.  Current therapy: 1. Systemic chemotherapy with Jevtana 25 mg/m2 given every 3 weeks. This was dose reduced to 20 mg started on cycle 12. Status post 13 cycles. 2. He is on Lupron 30 mg every 4 months. Last time given on 10/17/2014  Interim History: [name] presents today for a followup visit with his wife. Since the last visit, he reports doing fairly well.  He continues to tolerate Jevtana  without complications. He reports his constipation is a bit worse, stating he is only having 1 bowel movement per week. This has been going on for the last 2-3 weeks. He is been using Colace and Senokot without significant improvement. He has felt a  little weak and dizzy, denies syncopal episodes. His primary care doctor Dr. Marisue [name] manages his blood pressure medications. He denies any neuropathy or infusion related complications.He does not report any abdominal pain or vomiting. He does report decreased appetite. He continues to have some lower extremity edema which has not changed.  His quality of life, although slowly declining, is relatively stable overall.   Had not reported any genitourinary complaints or bleeding. He reports some burning in his groin area particularly with walking, that is been present for a long period of time.  He has not reported any new neurological complaints. Denied seizures or decline in his exercise tolerance. He has not reported any lymphadenopathy or pruritus. He has not reported any petechiae or bleeding.  Remainder of his review of systems unremarkable.     Medications: I have reviewed the patient's current medications. Current Outpatient Prescriptions  Medication Sig Dispense Refill  . amLODipine (NORVASC) 10 MG tablet Take 10 mg by mouth daily.    [name] aspirin 81 MG tablet Take 81 mg by mouth daily.      . calcium carbonate (OS-CAL) 1250 MG chewable tablet Chew 2 tablets by mouth daily.    [name] leuprolide (LUPRON) 30 MG injection Inject 30 mg into the muscle every 4 (four) months. LAST INJECTION JAN 2016    . lidocaine-prilocaine (EMLA) cream Apply topically as needed. 1 g 2  . losartan-hydrochlorothiazide (HYZAAR) 50-12.5 MG per tablet Take 1 tablet by mouth every evening.    [name]  mirabegron ER (MYRBETRIQ) 50 MG TB24 tablet Take 50 mg by mouth every morning.    . ondansetron (ZOFRAN) 8 MG tablet Take 1 tablet (8 mg total) by mouth every 8 (eight) hours as needed for nausea or vomiting. 20 tablet 3  . oxyCODONE (OXY IR/ROXICODONE) 5 MG immediate release tablet Take 1 tablet (5 mg total) by mouth every 4 (four) hours as needed for severe pain. 30 tablet 0   No current facility-administered medications for this  visit.   Facility-Administered Medications Ordered in Other Visits  Medication Dose Route Frequency Provider Last Rate Last Dose  . sodium chloride 0.9 % injection 10 mL  10 mL Intravenous PRN Carlton Adam, PA-C   10 mL at 03/13/15 9373    Allergies: No Known Allergies  Past Medical History, Surgical history, Social history, and Family History were reviewed and updated.    Physical Exam: Blood pressure 99/57, pulse 85, temperature 98.1 F (36.7 C), temperature source Oral, resp. rate 18, height 5\' 6"  (1.676 m), weight 221 lb 3.2 oz (100.336 kg), SpO2 100 %. ECOG: 1 General appearance: alert awake appeared in no active distress. Head: Normocephalic, without obvious abnormality, atraumatic Neck: no adenopathy Lymph nodes: Cervical, supraclavicular, and axillary nodes normal. Heart:regular rate and rhythm, S1, S2 normal, no murmur, click, rub or gallop Lung:chest clear, no wheezing, rales, normal symmetric air entry. Chest wall examination: Right anterior chest porta cath, well healed. Abdomen: soft, non-tender, without masses or organomegaly EXT:1+ edema, greater on the left than on the right, which is unchanged.   Lab Results: Lab Results  Component Value Date   WBC 17.4* 03/13/2015   HGB 9.1* 03/13/2015   HCT 28.2* 03/13/2015   MCV 82.7 03/13/2015   PLT 186 03/13/2015     Chemistry      Component Value Date/Time   NA 138 03/13/2015 0817   NA 138 06/24/2014 0729   K 3.6 03/13/2015 0817   K 3.9 06/24/2014 0729   CL 104 02/06/2014 1005   CL 108* 03/09/2013 0923   CO2 25 03/13/2015 0817   CO2 26 02/06/2014 1005   BUN 20.8 03/13/2015 0817   BUN 12 02/06/2014 1005   CREATININE 1.1 03/13/2015 0817   CREATININE 0.89 02/06/2014 1005      Component Value Date/Time   CALCIUM 7.9* 03/13/2015 0817   CALCIUM 9.4 02/06/2014 1005   ALKPHOS 54 03/13/2015 0817   ALKPHOS 71 12/27/2012 0415   AST 13 03/13/2015 0817   AST 17 12/27/2012 0415   ALT 8 03/13/2015 0817   ALT 24  12/27/2012 0415   BILITOT 0.59 03/13/2015 0817   BILITOT 0.3 12/27/2012 0415       Results for [name] (MRN [id number]) as of 02/20/2015 08:50  Ref. Range 01/08/2015 08:28 01/30/2015 08:45  PSA Latest Ref Range: <=4.00 ng/mL 966.50 (H) 945.40 (H)       Impression and Plan:  This is a 69 year old gentleman with the following issues: 1. Castration-resistant prostate cancer with pelvic adenopathy. He is now on systemic chemotherapy in the form Jevtana  with Neulasta support. CT scan results from 01/08/2015 shows slight progression of disease but he certainly has exhausted all other treatment options. Christinia Gully has reasonably palliated his symptoms and have elected to continue on it for the time being and a reduced dose. The plan is to continue with the current dose and schedule to other options are available. He is awaiting the biopsy results from [facility] testing and if he does  have a mutation that is targetable, we will switch him accordingly. 2. IV access: Status post right anterior chest Port-A-Cath placement on 02/06/2014. Continue EMLA cream as needed. No complications at this time. 3. Hydronephrosis: He is S/P stent placement. This was replaced recently. 4. Hormonal deprivation.  He continues to be on Lupron. Last given October 17, 2014  and will be repeated on 02/20/2015 and 4 months after that.  5. 3.8 cm enhancing mass in the upper pole of the left kidney likely renal cell carcinoma. We will continue observation. Given his advanced prostate malignancy I see no need for immediate intervention. 6. Directed therapy: He is currently on Xgeva started on 12/19/2014 and that will be repeated on 01/30/2015 and every 6 week.  7. Pain the pelvic area: controlled at this time.  8. Constipation: The patient was drink a bottle of magnesium citrate, half a bottle initially, waiting 3-4 hours and if no bowel movement drink the other half bottle. Once this is done he is to take  miracle accident in the morning and 2 Senokot S at night. 9. Dizziness: This may be related to his blood pressure medications. His blood pressure is a bit low at 99/57 with a pulse of 85. Patient will call his primary care physician's office regarding these readings and advice on adjusting his blood pressure medications. 10. Followup will be in in 3 week for the start of cycle 15 Jevtana chemotherapy.    Awilda Metro E, PA-C  6/2/20169:30 AM

## 2015-03-14 ENCOUNTER — Ambulatory Visit (HOSPITAL_BASED_OUTPATIENT_CLINIC_OR_DEPARTMENT_OTHER): Payer: Medicare Other

## 2015-03-14 VITALS — BP 106/59 | HR 83 | Temp 98.1°F

## 2015-03-14 DIAGNOSIS — C61 Malignant neoplasm of prostate: Secondary | ICD-10-CM

## 2015-03-14 DIAGNOSIS — C7951 Secondary malignant neoplasm of bone: Secondary | ICD-10-CM

## 2015-03-14 DIAGNOSIS — Z5189 Encounter for other specified aftercare: Secondary | ICD-10-CM | POA: Diagnosis not present

## 2015-03-14 LAB — PSA: PSA: 1236 ng/mL — ABNORMAL HIGH (ref <=4.00)

## 2015-03-14 MED ORDER — PEGFILGRASTIM INJECTION 6 MG/0.6ML ~~LOC~~
6.0000 mg | PREFILLED_SYRINGE | Freq: Once | SUBCUTANEOUS | Status: AC
  Administered 2015-03-14: 6 mg via SUBCUTANEOUS
  Filled 2015-03-14: qty 0.6

## 2015-03-14 NOTE — Patient Instructions (Signed)
Call your primary care physician regarding her blood low blood pressure readings and dizziness Follow-up in 3 weeks prior to next scheduled cycle of chemotherapy

## 2015-04-03 ENCOUNTER — Ambulatory Visit (HOSPITAL_BASED_OUTPATIENT_CLINIC_OR_DEPARTMENT_OTHER): Payer: Medicare Other

## 2015-04-03 ENCOUNTER — Telehealth: Payer: Self-pay | Admitting: Oncology

## 2015-04-03 ENCOUNTER — Ambulatory Visit (HOSPITAL_BASED_OUTPATIENT_CLINIC_OR_DEPARTMENT_OTHER): Payer: Medicare Other | Admitting: Oncology

## 2015-04-03 ENCOUNTER — Ambulatory Visit: Payer: Medicare Other

## 2015-04-03 ENCOUNTER — Other Ambulatory Visit: Payer: Self-pay | Admitting: *Deleted

## 2015-04-03 ENCOUNTER — Other Ambulatory Visit (HOSPITAL_BASED_OUTPATIENT_CLINIC_OR_DEPARTMENT_OTHER): Payer: Medicare Other

## 2015-04-03 VITALS — BP 125/67 | HR 76 | Temp 98.0°F | Resp 18 | Ht 66.0 in | Wt 223.8 lb

## 2015-04-03 DIAGNOSIS — N1330 Unspecified hydronephrosis: Secondary | ICD-10-CM | POA: Diagnosis not present

## 2015-04-03 DIAGNOSIS — C61 Malignant neoplasm of prostate: Secondary | ICD-10-CM

## 2015-04-03 DIAGNOSIS — Z5111 Encounter for antineoplastic chemotherapy: Secondary | ICD-10-CM

## 2015-04-03 DIAGNOSIS — N289 Disorder of kidney and ureter, unspecified: Secondary | ICD-10-CM

## 2015-04-03 DIAGNOSIS — C775 Secondary and unspecified malignant neoplasm of intrapelvic lymph nodes: Secondary | ICD-10-CM | POA: Diagnosis not present

## 2015-04-03 DIAGNOSIS — Z95828 Presence of other vascular implants and grafts: Secondary | ICD-10-CM

## 2015-04-03 LAB — CBC WITH DIFFERENTIAL/PLATELET
BASO%: 0.6 % (ref 0.0–2.0)
Basophils Absolute: 0 10*3/uL (ref 0.0–0.1)
EOS%: 1.1 % (ref 0.0–7.0)
Eosinophils Absolute: 0.1 10*3/uL (ref 0.0–0.5)
HCT: 30.3 % — ABNORMAL LOW (ref 38.4–49.9)
HEMOGLOBIN: 9.6 g/dL — AB (ref 13.0–17.1)
LYMPH%: 19.3 % (ref 14.0–49.0)
MCH: 26.2 pg — ABNORMAL LOW (ref 27.2–33.4)
MCHC: 31.7 g/dL — ABNORMAL LOW (ref 32.0–36.0)
MCV: 82.6 fL (ref 79.3–98.0)
MONO#: 0.6 10*3/uL (ref 0.1–0.9)
MONO%: 8.7 % (ref 0.0–14.0)
NEUT#: 4.6 10*3/uL (ref 1.5–6.5)
NEUT%: 70.3 % (ref 39.0–75.0)
PLATELETS: 186 10*3/uL (ref 140–400)
RBC: 3.67 10*6/uL — AB (ref 4.20–5.82)
RDW: 17 % — AB (ref 11.0–14.6)
WBC: 6.5 10*3/uL (ref 4.0–10.3)
lymph#: 1.3 10*3/uL (ref 0.9–3.3)

## 2015-04-03 LAB — COMPREHENSIVE METABOLIC PANEL (CC13)
ALBUMIN: 3.4 g/dL — AB (ref 3.5–5.0)
ALT: 8 U/L (ref 0–55)
AST: 13 U/L (ref 5–34)
Alkaline Phosphatase: 56 U/L (ref 40–150)
Anion Gap: 6 mEq/L (ref 3–11)
BUN: 14.4 mg/dL (ref 7.0–26.0)
CHLORIDE: 107 meq/L (ref 98–109)
CO2: 28 mEq/L (ref 22–29)
Calcium: 8 mg/dL — ABNORMAL LOW (ref 8.4–10.4)
Creatinine: 1 mg/dL (ref 0.7–1.3)
EGFR: >90 mL/min/{1.73_m2} (ref >90)
Glucose: 120 mg/dl (ref 70–140)
POTASSIUM: 3.9 meq/L (ref 3.5–5.1)
Sodium: 141 mEq/L (ref 136–145)
Total Bilirubin: 0.37 mg/dL (ref 0.20–1.20)
Total Protein: 6.5 g/dL (ref 6.4–8.3)

## 2015-04-03 MED ORDER — FAMOTIDINE IN NACL 20-0.9 MG/50ML-% IV SOLN
INTRAVENOUS | Status: AC
  Filled 2015-04-03: qty 50

## 2015-04-03 MED ORDER — DIPHENHYDRAMINE HCL 50 MG/ML IJ SOLN
INTRAMUSCULAR | Status: AC
  Filled 2015-04-03: qty 1

## 2015-04-03 MED ORDER — SODIUM CHLORIDE 0.9 % IV SOLN
Freq: Once | INTRAVENOUS | Status: AC
  Administered 2015-04-03: 11:00:00 via INTRAVENOUS
  Filled 2015-04-03: qty 4

## 2015-04-03 MED ORDER — SODIUM CHLORIDE 0.9 % IJ SOLN
10.0000 mL | INTRAMUSCULAR | Status: DC | PRN
Start: 2015-04-03 — End: 2015-04-03
  Administered 2015-04-03: 10 mL
  Filled 2015-04-03: qty 10

## 2015-04-03 MED ORDER — OXYCODONE HCL 5 MG PO TABS: 5.0000 mg | ORAL_TABLET | ORAL | Status: DC | PRN

## 2015-04-03 MED ORDER — HEPARIN SOD (PORK) LOCK FLUSH 100 UNIT/ML IV SOLN
500.0000 [IU] | Freq: Once | INTRAVENOUS | Status: AC | PRN
  Administered 2015-04-03: 500 [IU]
  Filled 2015-04-03: qty 5

## 2015-04-03 MED ORDER — DIPHENHYDRAMINE HCL 50 MG/ML IJ SOLN
25.0000 mg | Freq: Once | INTRAMUSCULAR | Status: AC
  Administered 2015-04-03: 25 mg via INTRAVENOUS

## 2015-04-03 MED ORDER — SODIUM CHLORIDE 0.9 % IJ SOLN
10.0000 mL | INTRAMUSCULAR | Status: DC | PRN
  Administered 2015-04-03: 10 mL via INTRAVENOUS
  Filled 2015-04-03: qty 10

## 2015-04-03 MED ORDER — DEXTROSE 5 % IV SOLN
20.0000 mg/m2 | Freq: Once | INTRAVENOUS | Status: AC
  Administered 2015-04-03: 46 mg via INTRAVENOUS
  Filled 2015-04-03: qty 4.6

## 2015-04-03 MED ORDER — FAMOTIDINE IN NACL 20-0.9 MG/50ML-% IV SOLN
20.0000 mg | Freq: Once | INTRAVENOUS | Status: AC
  Administered 2015-04-03: 20 mg via INTRAVENOUS

## 2015-04-03 MED ORDER — SODIUM CHLORIDE 0.9 % IV SOLN
Freq: Once | INTRAVENOUS | Status: AC
  Administered 2015-04-03: 11:00:00 via INTRAVENOUS

## 2015-04-03 NOTE — Progress Notes (Signed)
Hematology and Oncology Follow Up Visit  [name] [id number] 06-10-1946 69 y.o. 04/03/2015 10:11 AM   [name], [name]  [name] [name], [name]  Principle Diagnosis:This is a 70 year old gentleman with prostate cancer initially diagnosed in 2001.  He had a Gleason score of 3 + 3 = 6.  He has castration-resistant disease with pelvic adenopathy   Prior Therapy:  1. Status post prostatectomy done in May 2001.  He had T3 N1 disease.  PSA nadir to 0.  2. The patient developed biochemical relapse and received prostatic bed radiation.  3. The patient received Lupron with Casodex due to rising PSA.  The patient subsequently developed castration-resistant disease. 4. Patient treated with Casodex withdrawal and subsequently PSA rose to 18. 5. Patient with Provenge immunotherapy completed in July 2011. 6.  He received ketoconazole and prednisone December 2011 through January 2014. 7.         He is S/P Zytiga 1000 mg daily with Prednisone 5 mg daily on 11/03/12 till 07/2013. This was stopped due to progression of disease.  8.  Xtandi 1000 mg daily 07/2013 through 01/24/2014. Discontinued secondary to disease progression 9.         Docetaxel 75 mg/m2 with Neulasta support, status post 5 cycles. Discontinued 06/05/2014 secondary to disease progression.  Current therapy: 1. Systemic chemotherapy with Jevtana 25 mg/m2 given every 3 weeks. This was dose reduced to 20 mg started on cycle 12. Status post 14 cycles. 2. He is on Lupron 30 mg every 4 months. Last time given on 02/20/2015.  3. Xgeva given every 6 weeks last treatment given on 03/13/2015.  Interim History: [name] presents today for a followup visit with his wife. Since the last visit, he reports doing fairly well.  He continues to tolerate Jevtana  without complications. He denies any neuropathy or infusion related complications.He does not report any abdominal pain or vomiting. He does report decreased appetite. He  continues to have some lower extremity edema which has not changed.  His quality of life and continues to be relatively the same. He has not reported any hospitalization or illnesses.  Had not reported any genitourinary complaints or bleeding. He reports some burning in his groin area particularly with walking, that is been present for a long period of time.  He has not reported any new neurological complaints. Denied seizures or decline in his exercise tolerance. He has not reported any lymphadenopathy or pruritus. He has not reported any petechiae or bleeding.  Remainder of his review of systems unremarkable.     Medications: I have reviewed the patient's current medications. Current Outpatient Prescriptions  Medication Sig Dispense Refill  . amLODipine (NORVASC) 10 MG tablet Take 10 mg by mouth daily.    [name] aspirin EC 81 MG tablet Take 162 mg by mouth daily.    . calcium carbonate (OS-CAL) 1250 MG chewable tablet Chew 2 tablets by mouth daily.    [name] leuprolide (LUPRON) 30 MG injection Inject 30 mg into the muscle every 4 (four) months. LAST INJECTION JAN 2016    . lidocaine-prilocaine (EMLA) cream Apply topically as needed. 1 g 2  . loratadine (CLARITIN) 10 MG tablet Take 10 mg by mouth daily.    [name] losartan-hydrochlorothiazide (HYZAAR) 50-12.5 MG per tablet Take 1 tablet by mouth daily.    . mirabegron ER (MYRBETRIQ) 50 MG TB24 tablet Take 50 mg by mouth daily.    . ondansetron (ZOFRAN) 8 MG tablet Take 1 tablet (8 mg total)  by mouth every 8 (eight) hours as needed for nausea or vomiting. 20 tablet 3  . oxyCODONE (OXY IR/ROXICODONE) 5 MG immediate release tablet Take 1 tablet (5 mg total) by mouth every 4 (four) hours as needed for severe pain. 30 tablet 0   No current facility-administered medications for this visit.   Facility-Administered Medications Ordered in Other Visits  Medication Dose Route Frequency Provider Last Rate Last Dose  . sodium chloride 0.9 % injection 10 mL  10 mL  Intravenous PRN [name] Portela, MD        Allergies: No Known Allergies  Past Medical History, Surgical history, Social history, and Family History were reviewed and updated.    Physical Exam: Blood pressure 125/67, pulse 76, temperature 98 F (36.7 C), temperature source Oral, resp. rate 18, height 5\' 6"  (1.676 m), weight 223 lb 12.8 oz (101.515 kg). ECOG: 1 General appearance: alert awake appeared in no active distress. Head: Normocephalic, without obvious abnormality, atraumatic Neck: no adenopathy Lymph nodes: Cervical, supraclavicular, and axillary nodes normal. Heart:regular rate and rhythm, S1, S2 normal, no murmur, click, rub or gallop Lung:chest clear, no wheezing, rales, normal symmetric air entry. Chest wall examination: Right anterior chest porta cath, well healed. Abdomen: soft, non-tender, without masses or organomegaly EXT:1+ edema, greater on the left than on the right.   Lab Results: Lab Results  Component Value Date   WBC 17.4* 03/13/2015   HGB 9.1* 03/13/2015   HCT 28.2* 03/13/2015   MCV 82.7 03/13/2015   PLT 186 03/13/2015     Chemistry      Component Value Date/Time   NA 138 03/13/2015 0817   NA 138 06/24/2014 0729   K 3.6 03/13/2015 0817   K 3.9 06/24/2014 0729   CL 104 02/06/2014 1005   CL 108* 03/09/2013 0923   CO2 25 03/13/2015 0817   CO2 26 02/06/2014 1005   BUN 20.8 03/13/2015 0817   BUN 12 02/06/2014 1005   CREATININE 1.1 03/13/2015 0817   CREATININE 0.89 02/06/2014 1005      Component Value Date/Time   CALCIUM 7.9* 03/13/2015 0817   CALCIUM 9.4 02/06/2014 1005   ALKPHOS 54 03/13/2015 0817   ALKPHOS 71 12/27/2012 0415   AST 13 03/13/2015 0817   AST 17 12/27/2012 0415   ALT 8 03/13/2015 0817   ALT 24 12/27/2012 0415   BILITOT 0.59 03/13/2015 0817   BILITOT 0.3 12/27/2012 0415       Results for [name] (MRN [id number]) as of 04/03/2015 10:14  Ref. Range 02/20/2015 08:23 03/13/2015 08:17  PSA Latest Ref Range: <=4.00  ng/mL 1012.00 (H) 1236.00 (H)        Impression and Plan:  This is a 69 year old gentleman with the following issues: 1. Castration-resistant prostate cancer with pelvic adenopathy. He is now on systemic chemotherapy in the form Jevtana  with Neulasta support. CT scan results from 01/08/2015 shows slight progression of disease but he certainly has exhausted all other treatment options. Christinia Gully has reasonably palliated his symptoms and have elected to continue on it for the time being and a reduced dose. He is under consideration to start definitive salvage therapy as a part of the [facility] program at [facility]. 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 Until he get this medication approved, we have agreed to continue systemic chemotherapy every 3 weeks. If he gets this medication within the  next 3 weeks, next chemotherapy cycle will be discontinued. 2. IV access: Status post right anterior chest Port-A-Cath placement on 02/06/2014. Continue EMLA cream as needed. No complications at this time. 3. Hydronephrosis: He is S/P stent placement. Kidney function continues to be adequate. 4. Hormonal deprivation.  He continues to be on Lupron. Last given  on 02/20/2015 and 4 months after that.  5. 3.8 cm enhancing mass in the upper pole of the left kidney likely renal cell carcinoma. We will continue observation. Given his advanced prostate malignancy I see no need for immediate intervention. 6. Bone Directed therapy: He is currently on Xgeva last given on 03/13/2015. This will be given again in 6 weeks from that date on 04/24/2015. 7. Constipation: Improved at this time.. 8. Followup will be in in 3 week for the start of the next cycle of chemotherapy. This will not be given if he starts on different salvage oral regimen once his insurance approves it.    [facility], MD 6/23/201610:11 AM

## 2015-04-03 NOTE — Patient Instructions (Signed)
[facility] Discharge Instructions for Patients Receiving Chemotherapy  Today you received the following chemotherapy agents: Jevtana.  To help prevent nausea and vomiting after your treatment, we encourage you to take your nausea medication: Zofran. Take one every 8 hours as needed.  If you develop nausea and vomiting that is not controlled by your nausea medication, call the clinic.   BELOW ARE SYMPTOMS THAT SHOULD BE REPORTED IMMEDIATELY:  *FEVER GREATER THAN 100.5 F  *CHILLS WITH OR WITHOUT FEVER  NAUSEA AND VOMITING THAT IS NOT CONTROLLED WITH YOUR NAUSEA MEDICATION  *UNUSUAL SHORTNESS OF BREATH  *UNUSUAL BRUISING OR BLEEDING  TENDERNESS IN MOUTH AND THROAT WITH OR WITHOUT PRESENCE OF ULCERS  *URINARY PROBLEMS  *BOWEL PROBLEMS  UNUSUAL RASH Items with * indicate a potential emergency and should be followed up as soon as possible.  Feel free to call the clinic should you have any questions or concerns. The clinic phone number is [phone].  Please show the CHEMO ALERT CARD at check-in to the Emergency Department and triage nurse.   

## 2015-04-03 NOTE — Telephone Encounter (Signed)
Added appt per pof...per orders pof pt aware °

## 2015-04-03 NOTE — Patient Instructions (Signed)

## 2015-04-03 NOTE — Telephone Encounter (Signed)
per pof to sch pt appt-per Michelle(sch)ok to sch lab & flush day b4 due to lab/flush meeting-Dr Alen Blew had no other 30 min slots and shadad req 8:30 Appt-ok for pt to come 8/3-MW sch trmt-gave pt copy of  avs

## 2015-04-04 ENCOUNTER — Ambulatory Visit (HOSPITAL_BASED_OUTPATIENT_CLINIC_OR_DEPARTMENT_OTHER): Payer: Medicare Other

## 2015-04-04 VITALS — BP 137/68 | HR 73 | Temp 98.5°F

## 2015-04-04 DIAGNOSIS — C61 Malignant neoplasm of prostate: Secondary | ICD-10-CM

## 2015-04-04 DIAGNOSIS — Z5189 Encounter for other specified aftercare: Secondary | ICD-10-CM | POA: Diagnosis not present

## 2015-04-04 LAB — PSA: PSA: 1300 ng/mL — ABNORMAL HIGH (ref <=4.00)

## 2015-04-04 MED ORDER — PEGFILGRASTIM INJECTION 6 MG/0.6ML ~~LOC~~
6.0000 mg | PREFILLED_SYRINGE | Freq: Once | SUBCUTANEOUS | Status: AC
  Administered 2015-04-04: 6 mg via SUBCUTANEOUS
  Filled 2015-04-04: qty 0.6

## 2015-04-15 ENCOUNTER — Telehealth: Payer: Self-pay | Admitting: Oncology

## 2015-04-15 ENCOUNTER — Telehealth: Payer: Self-pay | Admitting: *Deleted

## 2015-04-15 NOTE — Telephone Encounter (Signed)
Per staff message and POF I have scheduled appts. Advised scheduler of appts. JMW  

## 2015-04-15 NOTE — Telephone Encounter (Addendum)
Added inj and sent msg to have chemo added on 07/14 per 06/23 POF, injections not added for neulasta and no chemo added for POF and no msg was sent to chemo to add per MW.... KJ, Dr. Hazeline Junker nurse said she would call pt once everything was added and the injection per Chris/Desk nurse needed to be cancelled for 07/06... KJ

## 2015-04-16 ENCOUNTER — Ambulatory Visit: Payer: Medicare Other

## 2015-04-24 ENCOUNTER — Other Ambulatory Visit (HOSPITAL_BASED_OUTPATIENT_CLINIC_OR_DEPARTMENT_OTHER): Payer: Medicare Other

## 2015-04-24 ENCOUNTER — Ambulatory Visit (HOSPITAL_BASED_OUTPATIENT_CLINIC_OR_DEPARTMENT_OTHER): Payer: Medicare Other

## 2015-04-24 ENCOUNTER — Ambulatory Visit: Payer: Medicare Other

## 2015-04-24 ENCOUNTER — Encounter: Payer: Self-pay | Admitting: Physician Assistant

## 2015-04-24 ENCOUNTER — Ambulatory Visit (HOSPITAL_BASED_OUTPATIENT_CLINIC_OR_DEPARTMENT_OTHER): Payer: Medicare Other | Admitting: Physician Assistant

## 2015-04-24 DIAGNOSIS — C61 Malignant neoplasm of prostate: Secondary | ICD-10-CM | POA: Diagnosis present

## 2015-04-24 DIAGNOSIS — C7951 Secondary malignant neoplasm of bone: Secondary | ICD-10-CM | POA: Diagnosis present

## 2015-04-24 DIAGNOSIS — Z95828 Presence of other vascular implants and grafts: Secondary | ICD-10-CM

## 2015-04-24 LAB — COMPREHENSIVE METABOLIC PANEL (CC13)
ALBUMIN: 3.7 g/dL (ref 3.5–5.0)
ALT: 7 U/L (ref 0–55)
AST: 12 U/L (ref 5–34)
Alkaline Phosphatase: 54 U/L (ref 40–150)
Anion Gap: 8 mEq/L (ref 3–11)
BILIRUBIN TOTAL: 0.42 mg/dL (ref 0.20–1.20)
BUN: 19.4 mg/dL (ref 7.0–26.0)
CHLORIDE: 107 meq/L (ref 98–109)
CO2: 27 meq/L (ref 22–29)
CREATININE: 1.1 mg/dL (ref 0.7–1.3)
Calcium: 9.5 mg/dL (ref 8.4–10.4)
EGFR: 82 mL/min/{1.73_m2} — ABNORMAL LOW (ref >90)
GLUCOSE: 109 mg/dL (ref 70–140)
Potassium: 3.9 mEq/L (ref 3.5–5.1)
Sodium: 142 mEq/L (ref 136–145)
Total Protein: 7 g/dL (ref 6.4–8.3)

## 2015-04-24 LAB — CBC WITH DIFFERENTIAL/PLATELET
BASO%: 0.7 % (ref 0.0–2.0)
BASOS ABS: 0.1 10*3/uL (ref 0.0–0.1)
EOS%: 2.1 % (ref 0.0–7.0)
Eosinophils Absolute: 0.1 10*3/uL (ref 0.0–0.5)
HEMATOCRIT: 31.7 % — AB (ref 38.4–49.9)
HGB: 10 g/dL — ABNORMAL LOW (ref 13.0–17.1)
LYMPH#: 1.2 10*3/uL (ref 0.9–3.3)
LYMPH%: 18.1 % (ref 14.0–49.0)
MCH: 25.8 pg — ABNORMAL LOW (ref 27.2–33.4)
MCHC: 31.5 g/dL — ABNORMAL LOW (ref 32.0–36.0)
MCV: 81.7 fL (ref 79.3–98.0)
MONO#: 0.5 10*3/uL (ref 0.1–0.9)
MONO%: 7.6 % (ref 0.0–14.0)
NEUT#: 4.9 10*3/uL (ref 1.5–6.5)
NEUT%: 71.5 % (ref 39.0–75.0)
Platelets: 227 10*3/uL (ref 140–400)
RBC: 3.88 10*6/uL — AB (ref 4.20–5.82)
RDW: 17.1 % — ABNORMAL HIGH (ref 11.0–14.6)
WBC: 6.8 10*3/uL (ref 4.0–10.3)
nRBC: 0 % (ref 0–0)

## 2015-04-24 MED ORDER — SODIUM CHLORIDE 0.9 % IJ SOLN
10.0000 mL | INTRAMUSCULAR | Status: DC | PRN
  Administered 2015-04-24: 10 mL via INTRAVENOUS
  Filled 2015-04-24: qty 10

## 2015-04-24 MED ORDER — ONDANSETRON HCL 8 MG PO TABS: 8.0000 mg | ORAL_TABLET | Freq: Three times a day (TID) | ORAL | Status: DC | PRN

## 2015-04-24 MED ORDER — HEPARIN SOD (PORK) LOCK FLUSH 100 UNIT/ML IV SOLN
500.0000 [IU] | Freq: Once | INTRAVENOUS | Status: AC
  Administered 2015-04-24: 500 [IU] via INTRAVENOUS
  Filled 2015-04-24: qty 5

## 2015-04-24 MED ORDER — SODIUM CHLORIDE 0.9 % IJ SOLN
10.0000 mL | INTRAMUSCULAR | Status: DC | PRN
Start: 2015-04-24 — End: 2015-05-01
  Administered 2015-04-24: 10 mL via INTRAVENOUS
  Filled 2015-04-24: qty 10

## 2015-04-24 MED ORDER — DENOSUMAB 120 MG/1.7ML ~~LOC~~ SOLN
120.0000 mg | Freq: Once | SUBCUTANEOUS | Status: AC
  Administered 2015-04-24: 120 mg via SUBCUTANEOUS
  Filled 2015-04-24: qty 1.7

## 2015-04-24 MED ORDER — OXYCODONE HCL 5 MG PO TABS: 5.0000 mg | ORAL_TABLET | ORAL | Status: DC | PRN

## 2015-04-24 NOTE — Patient Instructions (Signed)

## 2015-04-24 NOTE — Progress Notes (Signed)
Treatment today cancelled per provider.

## 2015-04-24 NOTE — Progress Notes (Signed)
Hematology and Oncology Follow Up Visit  [name] [id number] [birth year] 69 y.o. 04/24/2015 4:10 PM   [name], [name]  [name], [name]  Principle Diagnosis:This is a 69 year old gentleman with prostate cancer initially diagnosed in 2001.  He had a Gleason score of 3 + 3 = 6.  He has castration-resistant disease with pelvic adenopathy   Prior Therapy:  1. Status post prostatectomy done in May 2001.  He had T3 N1 disease.  PSA nadir to 0.  2. The patient developed biochemical relapse and received prostatic bed radiation.  3. The patient received Lupron with Casodex due to rising PSA.  The patient subsequently developed castration-resistant disease. 4. Patient treated with Casodex withdrawal and subsequently PSA rose to 18. 5. Patient with Provenge immunotherapy completed in July 2011. 6.  He received ketoconazole and prednisone December 2011 through January 2014. 7.         He is S/P Zytiga 1000 mg daily with Prednisone 5 mg daily on 11/03/12 till 07/2013. This was stopped due to progression of disease.  8.  Xtandi 1000 mg daily 07/2013 through 01/24/2014. Discontinued secondary to disease progression 9.         Docetaxel 75 mg/m2 with Neulasta support, status post 5 cycles. Discontinued 06/05/2014 secondary to disease progression. 10. Systemic chemotherapy with Jevtana 25 mg/m2 given every 3 weeks. This was dose reduced to 20 mg started on cycle 12. Status post 15 cycles.  Current therapy: 1. Lynparza (olaparib) 400 mg po BID, to begin this therapy in the next several days 2. He is on Lupron 30 mg every 4 months. Last time given on 02/20/2015.  3. Xgeva given every 6 weeks last treatment given on 03/13/2015.  Interim History: [name] presents today for a followup visit with his wife. Since the last visit, he reports doing fairly well.  He reports that he was approved for and has received [place] He is to follow-up with Dr. Thera Flake at [facility] to get a CT  scan on 05/05/2015 and then he will start the Lynparza  at 400 mg by mouth twice daily. He tolerated theJevtana  without complications. He denies any neuropathy or infusion related complications.He does not report any abdominal pain or vomiting. He does report decreased appetite. He continues to have some lower extremity edema which has not changed.  His quality of life and continues to be relatively the same. He has not reported any hospitalization or illnesses.  Had not reported any genitourinary complaints or bleeding. He reports some burning in his groin area particularly with walking, that is been present for a long period of time.  He has not reported any new neurological complaints. Denied seizures or decline in his exercise tolerance. He has not reported any lymphadenopathy or pruritus. He has not reported any petechiae or bleeding. He request a refill for his Zofran and oxycodone. Remainder of his review of systems unremarkable.     Medications: I have reviewed the patient's current medications. Current Outpatient Prescriptions  Medication Sig Dispense Refill  . amLODipine (NORVASC) 10 MG tablet Take 10 mg by mouth daily.    [name] aspirin EC 81 MG tablet Take 162 mg by mouth daily.    . calcium carbonate (OS-CAL) 1250 MG chewable tablet Chew 2 tablets by mouth daily.    [name] leuprolide (LUPRON) 30 MG injection Inject 30 mg into the muscle every 4 (four) months. LAST INJECTION JAN 2016    . lidocaine-prilocaine (EMLA) cream Apply topically as  needed. 1 g 2  . loratadine (CLARITIN) 10 MG tablet Take 10 mg by mouth daily.    [name] losartan-hydrochlorothiazide (HYZAAR) 50-12.5 MG per tablet Take 1 tablet by mouth daily.    . mirabegron ER (MYRBETRIQ) 50 MG TB24 tablet Take 50 mg by mouth daily.    [name] olaparib (LYNPARZA) 50 MG capsule Take 400 mg by mouth 2 (two) times daily. Swallow whole.    . ondansetron (ZOFRAN) 8 MG tablet Take 1 tablet (8 mg total) by mouth every 8 (eight) hours as needed for nausea  or vomiting. 20 tablet 3  . oxyCODONE (OXY IR/ROXICODONE) 5 MG immediate release tablet Take 1 tablet (5 mg total) by mouth every 4 (four) hours as needed for severe pain. 30 tablet 0   Current Facility-Administered Medications  Medication Dose Route Frequency Provider Last Rate Last Dose  . sodium chloride 0.9 % injection 10 mL  10 mL Intravenous PRN [name], MD   10 mL at 04/24/15 1046    Allergies: No Known Allergies  Past Medical History, Surgical history, Social history, and Family History were reviewed and updated.    Physical Exam: Blood pressure 118/58, pulse 74, temperature 98.5 F (36.9 C), temperature source Oral, resp. rate 19, height 5\' 6"  (1.676 m), weight 215 lb 9.6 oz (97.796 kg), SpO2 100 %. ECOG: 1 General appearance: alert awake appeared in no active distress. Head: Normocephalic, without obvious abnormality, atraumatic Neck: no adenopathy Lymph nodes: Cervical, supraclavicular, and axillary nodes normal. Heart:regular rate and rhythm, S1, S2 normal, no murmur, click, rub or gallop Lung:chest clear, no wheezing, rales, normal symmetric air entry. Chest wall examination: Right anterior chest porta cath, well healed. Abdomen: soft, non-tender, without masses or organomegaly EXT:1+ edema, greater on the left than on the right.   Lab Results: Lab Results  Component Value Date   WBC 6.8 04/24/2015   HGB 10.0* 04/24/2015   HCT 31.7* 04/24/2015   MCV 81.7 04/24/2015   PLT 227 04/24/2015     Chemistry      Component Value Date/Time   NA 142 04/24/2015 0912   NA 138 06/24/2014 0729   K 3.9 04/24/2015 0912   K 3.9 06/24/2014 0729   CL 104 02/06/2014 1005   CL 108* 03/09/2013 0923   CO2 27 04/24/2015 0912   CO2 26 02/06/2014 1005   BUN 19.4 04/24/2015 0912   BUN 12 02/06/2014 1005   CREATININE 1.1 04/24/2015 0912   CREATININE 0.89 02/06/2014 1005      Component Value Date/Time   CALCIUM 9.5 04/24/2015 0912   CALCIUM 9.4 02/06/2014 1005   ALKPHOS  54 04/24/2015 0912   ALKPHOS 71 12/27/2012 0415   AST 12 04/24/2015 0912   AST 17 12/27/2012 0415   ALT 7 04/24/2015 0912   ALT 24 12/27/2012 0415   BILITOT 0.42 04/24/2015 0912   BILITOT 0.3 12/27/2012 0415       Results for JORDAN, PARDINI (MRN [id number]) as of 04/03/2015 10:14  Ref. Range 02/20/2015 08:23 03/13/2015 08:17  PSA Latest Ref Range: <=4.00 ng/mL 1012.00 (H) 1236.00 (H)        Impression and Plan:  This is a 69 year old gentleman with the following issues: 1. Castration-resistant prostate cancer with pelvic adenopathy. He is now on systemic chemotherapy in the form Jevtana  with Neulasta support. CT scan results from 01/08/2015 shows slight progression of disease but he certainly has exhausted all other treatment options. Christinia Gully has reasonably palliated his symptoms and have elected  to continue on it for the time being and a reduced dose. He is under consideration to start definitive salvage therapy as a part of the Precision Medicine program at [facility]. 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 Chemotherapy has been discontinued since he has received and will start the [place] soon. 2. IV access: Status post right anterior chest Port-A-Cath placement on 02/06/2014. Continue EMLA cream as needed. No complications at this time. Port A Cath will need flushed at least every 6-8 weeks. 3. Hydronephrosis: He is S/P stent placement. Kidney function continues to be adequate. 4. Hormonal deprivation.  He continues to be on Lupron. Last given  on 02/20/2015 and 4 months after that.  5. 3.8 cm enhancing mass in the upper pole of the left kidney likely renal cell carcinoma. We will continue observation. Given his advanced prostate malignancy, no need for immediate intervention. 6. Bone Directed therapy: He is currently on Xgeva  last given on 03/13/2015. This will be given again today on 04/24/2015 and again in 6 weeks.  7. Constipation: Improved at this time.. Followup will be in in 3 weeks for symptom management visit after starting the salvage chemotherapy with Lynparza.   He was given refill prescriptions for both Zofran and oxycodone tablets.  Carlton Adam, PA-C  7/14/20164:10 PM

## 2015-04-25 ENCOUNTER — Ambulatory Visit: Payer: Medicare Other

## 2015-04-25 LAB — PSA: PSA: 1306 ng/mL — ABNORMAL HIGH (ref <=4.00)

## 2015-05-01 NOTE — Patient Instructions (Signed)
Follow-up in 3 weeks as previously scheduled

## 2015-05-14 ENCOUNTER — Other Ambulatory Visit (HOSPITAL_BASED_OUTPATIENT_CLINIC_OR_DEPARTMENT_OTHER): Payer: Medicare Other

## 2015-05-14 ENCOUNTER — Other Ambulatory Visit: Payer: Self-pay | Admitting: Oncology

## 2015-05-14 ENCOUNTER — Other Ambulatory Visit: Payer: Medicare Other

## 2015-05-14 ENCOUNTER — Ambulatory Visit (HOSPITAL_BASED_OUTPATIENT_CLINIC_OR_DEPARTMENT_OTHER): Payer: Medicare Other

## 2015-05-14 DIAGNOSIS — C61 Malignant neoplasm of prostate: Secondary | ICD-10-CM

## 2015-05-14 DIAGNOSIS — C7951 Secondary malignant neoplasm of bone: Secondary | ICD-10-CM | POA: Diagnosis not present

## 2015-05-14 DIAGNOSIS — Z95828 Presence of other vascular implants and grafts: Secondary | ICD-10-CM

## 2015-05-14 LAB — CBC WITH DIFFERENTIAL/PLATELET
BASO%: 1.2 % (ref 0.0–2.0)
Basophils Absolute: 0.1 10*3/uL (ref 0.0–0.1)
EOS%: 4.7 % (ref 0.0–7.0)
Eosinophils Absolute: 0.2 10*3/uL (ref 0.0–0.5)
HCT: 29.7 % — ABNORMAL LOW (ref 38.4–49.9)
HGB: 9.4 g/dL — ABNORMAL LOW (ref 13.0–17.1)
LYMPH%: 16.3 % (ref 14.0–49.0)
MCH: 25.4 pg — ABNORMAL LOW (ref 27.2–33.4)
MCHC: 31.6 g/dL — AB (ref 32.0–36.0)
MCV: 80.4 fL (ref 79.3–98.0)
MONO#: 0.3 10*3/uL (ref 0.1–0.9)
MONO%: 5.7 % (ref 0.0–14.0)
NEUT%: 72.1 % (ref 39.0–75.0)
NEUTROS ABS: 3.4 10*3/uL (ref 1.5–6.5)
PLATELETS: 172 10*3/uL (ref 140–400)
RBC: 3.69 10*6/uL — ABNORMAL LOW (ref 4.20–5.82)
RDW: 16.8 % — ABNORMAL HIGH (ref 11.0–14.6)
WBC: 4.7 10*3/uL (ref 4.0–10.3)
lymph#: 0.8 10*3/uL — ABNORMAL LOW (ref 0.9–3.3)

## 2015-05-14 LAB — COMPREHENSIVE METABOLIC PANEL (CC13)
ALT: 10 U/L (ref 0–55)
AST: 13 U/L (ref 5–34)
Albumin: 3.6 g/dL (ref 3.5–5.0)
Alkaline Phosphatase: 44 U/L (ref 40–150)
Anion Gap: 7 mEq/L (ref 3–11)
BILIRUBIN TOTAL: 0.48 mg/dL (ref 0.20–1.20)
BUN: 15.9 mg/dL (ref 7.0–26.0)
CHLORIDE: 107 meq/L (ref 98–109)
CO2: 28 mEq/L (ref 22–29)
CREATININE: 1.4 mg/dL — AB (ref 0.7–1.3)
Calcium: 9 mg/dL (ref 8.4–10.4)
EGFR: 60 mL/min/{1.73_m2} — ABNORMAL LOW (ref >90)
GLUCOSE: 132 mg/dL (ref 70–140)
Potassium: 3.8 mEq/L (ref 3.5–5.1)
SODIUM: 142 meq/L (ref 136–145)
Total Protein: 6.8 g/dL (ref 6.4–8.3)

## 2015-05-14 MED ORDER — HEPARIN SOD (PORK) LOCK FLUSH 100 UNIT/ML IV SOLN
500.0000 [IU] | Freq: Once | INTRAVENOUS | Status: AC
  Administered 2015-05-14: 500 [IU] via INTRAVENOUS
  Filled 2015-05-14: qty 5

## 2015-05-14 MED ORDER — SODIUM CHLORIDE 0.9 % IJ SOLN
10.0000 mL | INTRAMUSCULAR | Status: DC | PRN
Start: 2015-05-14 — End: 2015-05-14
  Administered 2015-05-14: 10 mL via INTRAVENOUS
  Filled 2015-05-14: qty 10

## 2015-05-14 NOTE — Patient Instructions (Signed)

## 2015-05-15 ENCOUNTER — Ambulatory Visit: Payer: Medicare Other

## 2015-05-15 ENCOUNTER — Ambulatory Visit (HOSPITAL_BASED_OUTPATIENT_CLINIC_OR_DEPARTMENT_OTHER): Payer: Medicare Other | Admitting: Oncology

## 2015-05-15 ENCOUNTER — Telehealth: Payer: Self-pay | Admitting: Oncology

## 2015-05-15 VITALS — BP 119/67 | HR 75 | Temp 98.6°F | Resp 18 | Ht 66.0 in | Wt 213.0 lb

## 2015-05-15 DIAGNOSIS — C7951 Secondary malignant neoplasm of bone: Secondary | ICD-10-CM

## 2015-05-15 DIAGNOSIS — R109 Unspecified abdominal pain: Secondary | ICD-10-CM | POA: Diagnosis not present

## 2015-05-15 DIAGNOSIS — E291 Testicular hypofunction: Secondary | ICD-10-CM

## 2015-05-15 DIAGNOSIS — N1330 Unspecified hydronephrosis: Secondary | ICD-10-CM

## 2015-05-15 DIAGNOSIS — C61 Malignant neoplasm of prostate: Secondary | ICD-10-CM

## 2015-05-15 LAB — PSA: PSA: 1380 ng/mL — AB (ref <=4.00)

## 2015-05-15 MED ORDER — OXYCODONE HCL 5 MG PO TABS: 5.0000 mg | ORAL_TABLET | ORAL | Status: DC | PRN

## 2015-05-15 NOTE — Progress Notes (Signed)
Hematology and Oncology Follow Up Visit  [name] [id number] 04-15-1946 69 y.o. 05/15/2015 8:33 AM   [name] Boxer. [name], [name]  [name] Jansky. [name] Humble, [name]  Principle Diagnosis: This is a 69 year old gentleman with prostate cancer initially diagnosed in 2001.  He had a Gleason score of 3 + 3 = 6.  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       Prior Therapy:  1. Status post prostatectomy done in May 2001.  He had T3 N1 disease.  PSA nadir to 0.  2. The patient developed biochemical relapse and received prostatic bed radiation.  3. The patient received Lupron with Casodex due to rising PSA.  The patient subsequently developed castration-resistant disease. 4. Patient treated with Casodex withdrawal and subsequently PSA rose to 18. 5. Patient with Provenge immunotherapy completed in July 2011. 6.  He received ketoconazole and prednisone December 2011 through January 2014. 7.         He is S/P Zytiga 1000 mg daily with Prednisone 5 mg daily on 11/03/12 till 07/2013. This was stopped due to progression of disease.  8.  Xtandi 1000 mg daily 07/2013 through 01/24/2014. Discontinued secondary to disease progression 9.         Docetaxel 75 mg/m2 with Neulasta support, status post 5 cycles. Discontinued 06/05/2014 secondary to disease progression. 10.      Systemic chemotherapy with Jevtana 25 mg/m2 given every 3 weeks. This was dose reduced to 20 mg started on cycle 12. Status post 15 cycles. Therapy discontinued due to progression of disease.  Current therapy: 1. Lynparza (olaparib) 400 mg po BID. Therapy started around 05/04/2015. 2. He is on Lupron 30 mg every 4 months. Last time given on 02/20/2015.  3. Xgeva given every 6 weeks last treatment given on 04/24/2015.  Interim History: [name] presents today for a followup visit with his wife. Since the last visit, he started Olaparib and have tolerated it well. He does report some occasional  dizziness and nausea but for the most part have been manageable. He denies any delayed complications from chemotherapy such as neuropathy. He does not report any abdominal pain or vomiting. He does report decreased appetite. He continues to have some lower extremity edema which has not changed.  His quality of life and continues to be relatively the same. He has not reported any hospitalization or illnesses. He continues to have lower pelvic fullness that is bothersome at times but takes oxycodone which helps his symptoms. He does not report any neurological deficits. Continues to ambulate without any difficulties. He does not report any hematuria or dysuria.  He does not report any headaches, blurry vision, syncope or seizures. He does report some occasional lightheadedness as mentioned. He does not report any chest pain, palpitation or orthopnea. He does report occasional leg edema. He does not report any cough, hemoptysis or hematemesis. Does not report any nausea, constipation, diarrhea or loose satiety. He does not report any arthralgias or myalgias. He does not report any back pain or shoulder pain. He does not report any lymphadenopathy or petechiae. Remainder of his review of systems unremarkable.     Medications: I have reviewed the patient's current medications. Current Outpatient Prescriptions  Medication Sig Dispense Refill  . amLODipine (NORVASC) 10 MG tablet Take 10 mg by mouth daily.    [name] aspirin EC 81 MG tablet Take 162 mg by mouth daily.    . calcium carbonate (OS-CAL) 1250 MG chewable tablet Chew 2 tablets  by mouth daily.    [name] leuprolide (LUPRON) 30 MG injection Inject 30 mg into the muscle every 4 (four) months. LAST INJECTION JAN 2016    . lidocaine-prilocaine (EMLA) cream Apply topically as needed. 1 g 2  . loratadine (CLARITIN) 10 MG tablet Take 10 mg by mouth daily.    [name] losartan-hydrochlorothiazide (HYZAAR) 50-12.5 MG per tablet Take 1 tablet by mouth daily.    . mirabegron  ER (MYRBETRIQ) 50 MG TB24 tablet Take 50 mg by mouth daily.    [name] olaparib (LYNPARZA) 50 MG capsule Take 400 mg by mouth 2 (two) times daily. Swallow whole.    . ondansetron (ZOFRAN) 8 MG tablet Take 1 tablet (8 mg total) by mouth every 8 (eight) hours as needed for nausea or vomiting. 20 tablet 3  . oxyCODONE (OXY IR/ROXICODONE) 5 MG immediate release tablet Take 1 tablet (5 mg total) by mouth every 4 (four) hours as needed for severe pain. 30 tablet 0   No current facility-administered medications for this visit.    Allergies: No Known Allergies  Past Medical History, Surgical history, Social history, and Family History were reviewed and updated.    Physical Exam: Blood pressure 119/67, pulse 75, temperature 98.6 F (37 C), temperature source Oral, resp. rate 18, height 5\' 6"  (1.676 m), weight 213 lb (96.616 kg), SpO2 100 %. ECOG: 1 General appearance: alert awake not in acute distress. Head: Normocephalic, without obvious abnormality Neck: no adenopathy Lymph nodes: Cervical, supraclavicular, and axillary nodes normal. Heart:regular rate and rhythm, S1, S2 normal, no murmur, click, rub or gallop Lung:chest clear, no wheezing, rales, normal symmetric air entry. Chest wall examination: Right anterior chest porta cath, well healed. Abdomen: soft, non-tender, without masses or organomegaly EXT:1+ edema, greater on the left than on the right.   Lab Results: Lab Results  Component Value Date   WBC 4.7 05/14/2015   HGB 9.4* 05/14/2015   HCT 29.7* 05/14/2015   MCV 80.4 05/14/2015   PLT 172 05/14/2015     Chemistry      Component Value Date/Time   NA 142 05/14/2015 0809   NA 138 06/24/2014 0729   K 3.8 05/14/2015 0809   K 3.9 06/24/2014 0729   CL 104 02/06/2014 1005   CL 108* 03/09/2013 0923   CO2 28 05/14/2015 0809   CO2 26 02/06/2014 1005   BUN 15.9 05/14/2015 0809   BUN 12 02/06/2014 1005   CREATININE 1.4* 05/14/2015 0809   CREATININE 0.89 02/06/2014 1005       Component Value Date/Time   CALCIUM 9.0 05/14/2015 0809   CALCIUM 9.4 02/06/2014 1005   ALKPHOS 44 05/14/2015 0809   ALKPHOS 71 12/27/2012 0415   AST 13 05/14/2015 0809   AST 17 12/27/2012 0415   ALT 10 05/14/2015 0809   ALT 24 12/27/2012 0415   BILITOT 0.48 05/14/2015 0809   BILITOT 0.3 12/27/2012 0415       Results for PHIL, MICHELS (MRN [id number]) as of 05/15/2015 08:20  Ref. Range 04/03/2015 09:42 04/24/2015 09:12 05/14/2015 08:09  PSA Latest Ref Range: <=4.00 ng/mL 1300.00 (H) 1306.00 (H) 1380.00 (H)         Impression and Plan:  This is a 69 year old gentleman with the following issues: 1. Castration-resistant prostate cancer with pelvic adenopathy. He has progressed on multiple therapies the last of which is Jevtana chemotherapy. His Foundation Medicine testing through [facility] showed that he might respond to a PARP inhibitor Lonie [place]- olaparib). He has  been on this medication for the last week and a half out of tolerated it well. His PSA is slightly higher up to 1380 but is really way too early to judge any response at this time. We will continue to monitor his response to this new medication. 2. IV access: Status post right anterior chest Port-A-Cath placement on 02/06/2014. Continue EMLA cream as needed. This will be flushed with every visit to obtain labs. 3. Hydronephrosis: He is S/P stent placement. Kidney function continues to be adequate. 4. Hormonal deprivation.  He continues to be on Lupron. Last given  on 02/20/2015 and 4 months after that.  5. 3.8 cm enhancing mass in the upper pole of the left kidney likely renal cell carcinoma. We will continue observation. Given his advanced prostate malignancy, no need for immediate intervention. 6. Bone Directed therapy: He is currently on Xgeva last given on 04/24/2015 and will be repeated each visit. That might be up to every 8 weeks which is reasonable interval. 7. Constipation: Improved at  this time. 8. Abdominal pain: Lower pelvic fullness related to his malignancy. He takes oxycodone with relief of the pain. I will refill his medication at this time. 9. Follow-up: Will be on September 21 for a follow-up visit.   [facility], MD 8/4/20168:33 AM

## 2015-05-15 NOTE — Telephone Encounter (Signed)
Pt confirmed labs/ov per 08/04 POF, gave pt avs and calendar.... KJ °

## 2015-05-16 ENCOUNTER — Ambulatory Visit: Payer: Medicare Other

## 2015-07-01 ENCOUNTER — Ambulatory Visit (HOSPITAL_BASED_OUTPATIENT_CLINIC_OR_DEPARTMENT_OTHER): Payer: Medicare Other

## 2015-07-01 ENCOUNTER — Other Ambulatory Visit (HOSPITAL_BASED_OUTPATIENT_CLINIC_OR_DEPARTMENT_OTHER): Payer: Medicare Other

## 2015-07-01 VITALS — BP 124/68 | HR 84 | Temp 98.7°F | Wt 201.0 lb

## 2015-07-01 DIAGNOSIS — Z5111 Encounter for antineoplastic chemotherapy: Secondary | ICD-10-CM

## 2015-07-01 DIAGNOSIS — C7951 Secondary malignant neoplasm of bone: Secondary | ICD-10-CM

## 2015-07-01 DIAGNOSIS — C61 Malignant neoplasm of prostate: Secondary | ICD-10-CM

## 2015-07-01 DIAGNOSIS — Z95828 Presence of other vascular implants and grafts: Secondary | ICD-10-CM

## 2015-07-01 LAB — COMPREHENSIVE METABOLIC PANEL (CC13)
ALBUMIN: 3.5 g/dL (ref 3.5–5.0)
ALK PHOS: 38 U/L — AB (ref 40–150)
ALT: 10 U/L (ref 0–55)
AST: 14 U/L (ref 5–34)
Anion Gap: 9 mEq/L (ref 3–11)
BUN: 15.2 mg/dL (ref 7.0–26.0)
CO2: 25 mEq/L (ref 22–29)
Calcium: 8.8 mg/dL (ref 8.4–10.4)
Chloride: 108 mEq/L (ref 98–109)
Creatinine: 1.3 mg/dL (ref 0.7–1.3)
EGFR: 63 mL/min/{1.73_m2} — ABNORMAL LOW (ref >90)
GLUCOSE: 129 mg/dL (ref 70–140)
Potassium: 3.6 mEq/L (ref 3.5–5.1)
Sodium: 143 mEq/L (ref 136–145)
Total Bilirubin: 0.44 mg/dL (ref 0.20–1.20)
Total Protein: 6.5 g/dL (ref 6.4–8.3)

## 2015-07-01 LAB — CBC WITH DIFFERENTIAL/PLATELET
BASO%: 0.6 % (ref 0.0–2.0)
Basophils Absolute: 0 10*3/uL (ref 0.0–0.1)
EOS%: 1.9 % (ref 0.0–7.0)
Eosinophils Absolute: 0.1 10*3/uL (ref 0.0–0.5)
HCT: 28.1 % — ABNORMAL LOW (ref 38.4–49.9)
HEMOGLOBIN: 9.1 g/dL — AB (ref 13.0–17.1)
LYMPH%: 20.1 % (ref 14.0–49.0)
MCH: 26.8 pg — AB (ref 27.2–33.4)
MCHC: 32.4 g/dL (ref 32.0–36.0)
MCV: 82.6 fL (ref 79.3–98.0)
MONO#: 0.4 10*3/uL (ref 0.1–0.9)
MONO%: 7.5 % (ref 0.0–14.0)
NEUT#: 3.3 10*3/uL (ref 1.5–6.5)
NEUT%: 69.9 % (ref 39.0–75.0)
Platelets: 145 10*3/uL (ref 140–400)
RBC: 3.4 10*6/uL — AB (ref 4.20–5.82)
RDW: 23.4 % — ABNORMAL HIGH (ref 11.0–14.6)
WBC: 4.8 10*3/uL (ref 4.0–10.3)
lymph#: 1 10*3/uL (ref 0.9–3.3)
nRBC: 1 % — ABNORMAL HIGH (ref 0–0)

## 2015-07-01 LAB — TECHNOLOGIST REVIEW

## 2015-07-01 MED ORDER — HEPARIN SOD (PORK) LOCK FLUSH 100 UNIT/ML IV SOLN
500.0000 [IU] | Freq: Once | INTRAVENOUS | Status: AC
Start: 2015-07-01 — End: 2015-07-01
  Administered 2015-07-01: 500 [IU] via INTRAVENOUS
  Filled 2015-07-01: qty 5

## 2015-07-01 MED ORDER — SODIUM CHLORIDE 0.9 % IJ SOLN
10.0000 mL | INTRAMUSCULAR | Status: DC | PRN
  Administered 2015-07-01: 10 mL via INTRAVENOUS
  Filled 2015-07-01: qty 10

## 2015-07-01 MED ORDER — DENOSUMAB 120 MG/1.7ML ~~LOC~~ SOLN
120.0000 mg | Freq: Once | SUBCUTANEOUS | Status: AC
  Administered 2015-07-01: 120 mg via SUBCUTANEOUS
  Filled 2015-07-01: qty 1.7

## 2015-07-01 MED ORDER — LEUPROLIDE ACETATE (4 MONTH) 30 MG IM KIT
30.0000 mg | PACK | Freq: Once | INTRAMUSCULAR | Status: AC
  Administered 2015-07-01: 30 mg via INTRAMUSCULAR
  Filled 2015-07-01: qty 30

## 2015-07-01 NOTE — Patient Instructions (Signed)

## 2015-07-02 ENCOUNTER — Ambulatory Visit (HOSPITAL_BASED_OUTPATIENT_CLINIC_OR_DEPARTMENT_OTHER): Payer: Medicare Other | Admitting: Oncology

## 2015-07-02 ENCOUNTER — Ambulatory Visit (HOSPITAL_COMMUNITY)
Admission: RE | Admit: 2015-07-02 | Discharge: 2015-07-02 | Disposition: A | Discharge status: Home / Self Care | Payer: Medicare Other | Source: Ambulatory Visit | Attending: Oncology | Admitting: Oncology

## 2015-07-02 ENCOUNTER — Telehealth: Payer: Self-pay | Admitting: Oncology

## 2015-07-02 VITALS — BP 130/76 | HR 101 | Temp 97.9°F | Resp 19 | Ht 66.0 in | Wt 201.4 lb

## 2015-07-02 DIAGNOSIS — C61 Malignant neoplasm of prostate: Secondary | ICD-10-CM | POA: Insufficient documentation

## 2015-07-02 DIAGNOSIS — I878 Other specified disorders of veins: Secondary | ICD-10-CM | POA: Insufficient documentation

## 2015-07-02 DIAGNOSIS — I517 Cardiomegaly: Secondary | ICD-10-CM

## 2015-07-02 DIAGNOSIS — R0602 Shortness of breath: Secondary | ICD-10-CM | POA: Diagnosis not present

## 2015-07-02 DIAGNOSIS — I2699 Other pulmonary embolism without acute cor pulmonale: Secondary | ICD-10-CM | POA: Diagnosis not present

## 2015-07-02 LAB — PSA: PSA: 1519 ng/mL — ABNORMAL HIGH (ref <=4.00)

## 2015-07-02 MED ORDER — OXYCODONE HCL 5 MG PO TABS: 5.0000 mg | ORAL_TABLET | ORAL | Status: DC | PRN

## 2015-07-02 NOTE — Progress Notes (Signed)
Hematology and Oncology Follow Up Visit  [name] [id number] 12-20-1945 69 y.o. 07/02/2015 9:09 AM   [name], [name]  [name], [name]  Principle Diagnosis: This is a 69 year old gentleman with prostate cancer initially diagnosed in 2001.  He had a Gleason score of 3 + 3 = 6.  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     Prior Therapy:  1. Status post prostatectomy done in May 2001.  He had T3 N1 disease.  PSA nadir to 0.  2. The patient developed biochemical relapse and received prostatic bed radiation.  3. The patient received Lupron with Casodex due to rising PSA.  The patient subsequently developed castration-resistant disease. 4. Patient treated with Casodex withdrawal and subsequently PSA rose to 18. 5. Patient with Provenge immunotherapy completed in July 2011. 6.  He received ketoconazole and prednisone December 2011 through January 2014. 7.         He is S/P Zytiga 1000 mg daily with Prednisone 5 mg daily on 11/03/12 till 07/2013. This was stopped due to progression of disease.  8.  Xtandi 1000 mg daily 07/2013 through 01/24/2014. Discontinued secondary to disease progression 9.         Docetaxel 75 mg/m2 with Neulasta support, status post 5 cycles. Discontinued 06/05/2014 secondary to disease progression. 10.      Systemic chemotherapy with Jevtana 25 mg/m2 given every 3 weeks. This was dose reduced to 20 mg started on cycle 12. Status post 15 cycles. Therapy discontinued due to progression of disease.  Current therapy: 1. Lynparza (olaparib) 400 mg po BID. Therapy started around 05/04/2015. 2. He is on Lupron 30 mg every 4 months. Last time given on 02/20/2015.  3. Xgeva given every 6 weeks last treatment given on 04/24/2015.  Interim History: [name] presents today for a followup visit with his wife. Since the last visit, he continues taking Olaparib and have tolerated it well. He has reported some exertional  dyspnea on the last week or so. He reports that shortness of breaths predominantly when he walks extended period of time. He reports no shortness of breath at rest. He reports no chest pain or cough.   He does report some occasional nausea but for the most part have been manageable. He denies any delayed complications from chemotherapy such as neuropathy. He does report decreased appetite and have lost some weight since the last visit. He continues to have some lower extremity edema which has not changed.  His quality of life and continues to be relatively the same.   He continues to have lower pelvic fullness that is bothersome at times but takes oxycodone which helps his symptoms. He reports no radiation of his pain and not associated with any other GI symptoms. Oxycodone continues to relieve his pain. He does not report any neurological deficits. Continues to ambulate without any difficulties. He does not report any hematuria or dysuria.  He does not report any headaches, blurry vision, syncope or seizures. He does report some occasional lightheadedness. He does not report any chest pain, palpitation or orthopnea. He does report occasional leg edema. He does not report any cough, hemoptysis or hematemesis. Does not report any constipation, diarrhea or early satiety. He does not report any arthralgias or myalgias. He does not report any back pain or shoulder pain. He does not report any lymphadenopathy or petechiae. Remainder of his review of systems unremarkable.     Medications: I have reviewed the patient's  current medications. Current Outpatient Prescriptions  Medication Sig Dispense Refill  . amLODipine (NORVASC) 10 MG tablet Take 10 mg by mouth daily.    [name] aspirin EC 81 MG tablet Take 162 mg by mouth daily.    . calcium carbonate (OS-CAL) 1250 MG chewable tablet Chew 2 tablets by mouth daily.    [name] leuprolide (LUPRON) 30 MG injection Inject 30 mg into the muscle every 4 (four) months. LAST  INJECTION JAN 2016    . lidocaine-prilocaine (EMLA) cream Apply topically as needed. 1 g 2  . loratadine (CLARITIN) 10 MG tablet Take 10 mg by mouth daily.    [name] losartan-hydrochlorothiazide (HYZAAR) 50-12.5 MG per tablet Take 1 tablet by mouth daily.    . mirabegron ER (MYRBETRIQ) 50 MG TB24 tablet Take 50 mg by mouth daily.    [name] olaparib (LYNPARZA) 50 MG capsule Take 400 mg by mouth 2 (two) times daily. Swallow whole.    . ondansetron (ZOFRAN) 8 MG tablet Take 1 tablet (8 mg total) by mouth every 8 (eight) hours as needed for nausea or vomiting. 20 tablet 3  . oxyCODONE (OXY IR/ROXICODONE) 5 MG immediate release tablet Take 1 tablet (5 mg total) by mouth every 4 (four) hours as needed for severe pain. 60 tablet 0   No current facility-administered medications for this visit.    Allergies: No Known Allergies  Past Medical History, Surgical history, Social history, and Family History were reviewed and updated.    Physical Exam: Blood pressure 130/76, pulse 101, temperature 97.9 F (36.6 C), temperature source Oral, resp. rate 19, height 5\' 6"  (1.676 m), weight 201 lb 6.4 oz (91.354 kg), SpO2 95 %. ECOG: 1 General appearance: alert awake chronically ill-appearing. Head: Normocephalic, without obvious abnormality no oral ulcers or lesions. Neck: no adenopathy Lymph nodes: Cervical, supraclavicular, and axillary nodes normal. Heart:regular rate and rhythm, S1, S2 normal, no murmur, click, rub or gallop Lung:chest clear, no wheezing, rales. Decreased breath sounds at the bases bilaterally. Chest wall examination: Right anterior chest porta cath, well healed. Abdomen: soft, non-tender, without masses or organomegaly or shifting dullness or ascites. EXT:1+ edema, greater on the left than on the right.   Lab Results: Lab Results  Component Value Date   WBC 4.8 07/01/2015   HGB 9.1* 07/01/2015   HCT 28.1* 07/01/2015   MCV 82.6 07/01/2015   PLT 145 07/01/2015     Chemistry       Component Value Date/Time   NA 143 07/01/2015 0905   NA 138 06/24/2014 0729   K 3.6 07/01/2015 0905   K 3.9 06/24/2014 0729   CL 104 02/06/2014 1005   CL 108* 03/09/2013 0923   CO2 25 07/01/2015 0905   CO2 26 02/06/2014 1005   BUN 15.2 07/01/2015 0905   BUN 12 02/06/2014 1005   CREATININE 1.3 07/01/2015 0905   CREATININE 0.89 02/06/2014 1005      Component Value Date/Time   CALCIUM 8.8 07/01/2015 0905   CALCIUM 9.4 02/06/2014 1005   ALKPHOS 38* 07/01/2015 0905   ALKPHOS 71 12/27/2012 0415   AST 14 07/01/2015 0905   AST 17 12/27/2012 0415   ALT 10 07/01/2015 0905   ALT 24 12/27/2012 0415   BILITOT 0.44 07/01/2015 0905   BILITOT 0.3 12/27/2012 0415        Results for [name], [name] (MRN [id number]) as of 07/02/2015 08:57  Ref. Range 04/24/2015 09:12 05/14/2015 08:09 07/01/2015 09:04  PSA Latest Ref Range: <=4.00 ng/mL 1306.00 (H) 1380.00 (H)  1519.00 (H)         Impression and Plan:  This is a 69 year old gentleman with the following issues: 1. Castration-resistant prostate cancer with pelvic adenopathy. He has progressed on multiple therapies the last of which is Jevtana chemotherapy. His Foundation Medicine testing through [facility] showed that he might respond to a PARP inhibitor Lonie Peak- olaparib). He has been on this medication and tolerated it well. His PSA is slightly higher up to 1519. Despite the slight rise in his PSA, and has overall stabilized since July 2016. I have recommended continue the same medication for the time being. 2. IV access: Status post right anterior chest Port-A-Cath placement on 02/06/2014. Continue EMLA cream as needed. This was flushed without incident on 07/01/2015. No complications associated with his Port-A-Cath. 3. Hydronephrosis: He is S/P stent placement. Laboratory data were reviewed including his kidney function which appear to be normal. 4. Hormonal deprivation.  He continues to be on Lupron. Last given   on 02/20/2015 this will be repeated with the next visit.  5. 3.8 cm enhancing mass in the upper pole of the left kidney likely renal cell carcinoma. Repeat imaging studies in the future will follow the progression of this tumor is noted. 6. Bone Directed therapy: He is currently on Xgeva last given on 04/24/2015 and needed to be repeated with the next visit. 7. Dyspnea on exertion: His examination did show decreased breath sounds bilaterally and possibly he's accumulating pleural effusion. The plan is to obtain a chest x-ray and possibly perform a thoracentesis if that is the case. I discussed with him the risks and benefits of therapeutic thoracentesis if he has pleural effusion. Complications include pneumothorax, pain or bleeding with the benefit would include resolution of his dyspnea on exertion.  8. Abdominal pain: Lower pelvic fullness related to his malignancy. He takes oxycodone with relief of the pain. I will refill his medication at this time. 9. Follow-up: Will be in 6 weeks.   [facility], MD 9/21/20169:09 AM

## 2015-07-02 NOTE — Telephone Encounter (Signed)
Gave and prntd appt sched and avs for pt for OCT and NOV

## 2015-07-03 ENCOUNTER — Telehealth: Payer: Self-pay | Admitting: *Deleted

## 2015-07-03 ENCOUNTER — Other Ambulatory Visit: Payer: Self-pay | Admitting: Oncology

## 2015-07-03 ENCOUNTER — Encounter: Payer: Self-pay | Admitting: *Deleted

## 2015-07-03 DIAGNOSIS — C61 Malignant neoplasm of prostate: Secondary | ICD-10-CM

## 2015-07-03 NOTE — Telephone Encounter (Signed)
PT. HAS BECOME MORE SHORT OF BREATH WHEN HE IS UP WALKING.

## 2015-07-03 NOTE — Progress Notes (Signed)
Spoke with patient, states he is SOB on E. Not while sitting still. Is not coughing up blood, denies fever. Per dr Alen Blew, will set up CT scan of chest. Schedulers will be calling him with date and time.

## 2015-07-04 ENCOUNTER — Other Ambulatory Visit: Payer: Self-pay

## 2015-07-04 ENCOUNTER — Encounter: Payer: Self-pay | Admitting: *Deleted

## 2015-07-04 ENCOUNTER — Inpatient Hospital Stay (HOSPITAL_COMMUNITY)
Admission: EM | Admit: 2015-07-04 | Discharge: 2015-07-08 | DRG: 175 | Disposition: A | Discharge status: Home / Self Care | Payer: Medicare Other | Attending: Internal Medicine | Admitting: Internal Medicine

## 2015-07-04 ENCOUNTER — Emergency Department (HOSPITAL_COMMUNITY): Payer: Medicare Other

## 2015-07-04 ENCOUNTER — Inpatient Hospital Stay (HOSPITAL_COMMUNITY): Payer: Medicare Other

## 2015-07-04 ENCOUNTER — Encounter (HOSPITAL_COMMUNITY): Payer: Self-pay | Admitting: *Deleted

## 2015-07-04 DIAGNOSIS — Z79891 Long term (current) use of opiate analgesic: Secondary | ICD-10-CM

## 2015-07-04 DIAGNOSIS — D638 Anemia in other chronic diseases classified elsewhere: Secondary | ICD-10-CM | POA: Diagnosis not present

## 2015-07-04 DIAGNOSIS — Z96653 Presence of artificial knee joint, bilateral: Secondary | ICD-10-CM | POA: Diagnosis present

## 2015-07-04 DIAGNOSIS — C61 Malignant neoplasm of prostate: Secondary | ICD-10-CM | POA: Diagnosis present

## 2015-07-04 DIAGNOSIS — Z8249 Family history of ischemic heart disease and other diseases of the circulatory system: Secondary | ICD-10-CM

## 2015-07-04 DIAGNOSIS — Z86711 Personal history of pulmonary embolism: Secondary | ICD-10-CM | POA: Diagnosis not present

## 2015-07-04 DIAGNOSIS — R600 Localized edema: Secondary | ICD-10-CM | POA: Diagnosis present

## 2015-07-04 DIAGNOSIS — M7989 Other specified soft tissue disorders: Secondary | ICD-10-CM | POA: Diagnosis not present

## 2015-07-04 DIAGNOSIS — I272 Other secondary pulmonary hypertension: Secondary | ICD-10-CM | POA: Diagnosis present

## 2015-07-04 DIAGNOSIS — C7951 Secondary malignant neoplasm of bone: Secondary | ICD-10-CM | POA: Diagnosis present

## 2015-07-04 DIAGNOSIS — Z79899 Other long term (current) drug therapy: Secondary | ICD-10-CM

## 2015-07-04 DIAGNOSIS — I248 Other forms of acute ischemic heart disease: Secondary | ICD-10-CM | POA: Diagnosis present

## 2015-07-04 DIAGNOSIS — Z86718 Personal history of other venous thrombosis and embolism: Secondary | ICD-10-CM | POA: Diagnosis not present

## 2015-07-04 DIAGNOSIS — M1990 Unspecified osteoarthritis, unspecified site: Secondary | ICD-10-CM | POA: Diagnosis present

## 2015-07-04 DIAGNOSIS — I071 Rheumatic tricuspid insufficiency: Secondary | ICD-10-CM | POA: Diagnosis present

## 2015-07-04 DIAGNOSIS — J9601 Acute respiratory failure with hypoxia: Secondary | ICD-10-CM | POA: Insufficient documentation

## 2015-07-04 DIAGNOSIS — D649 Anemia, unspecified: Secondary | ICD-10-CM | POA: Diagnosis present

## 2015-07-04 DIAGNOSIS — G4733 Obstructive sleep apnea (adult) (pediatric): Secondary | ICD-10-CM | POA: Diagnosis present

## 2015-07-04 DIAGNOSIS — I371 Nonrheumatic pulmonary valve insufficiency: Secondary | ICD-10-CM | POA: Diagnosis present

## 2015-07-04 DIAGNOSIS — I10 Essential (primary) hypertension: Secondary | ICD-10-CM | POA: Diagnosis present

## 2015-07-04 DIAGNOSIS — I129 Hypertensive chronic kidney disease with stage 1 through stage 4 chronic kidney disease, or unspecified chronic kidney disease: Secondary | ICD-10-CM | POA: Diagnosis present

## 2015-07-04 DIAGNOSIS — N182 Chronic kidney disease, stage 2 (mild): Secondary | ICD-10-CM | POA: Diagnosis present

## 2015-07-04 DIAGNOSIS — Z7982 Long term (current) use of aspirin: Secondary | ICD-10-CM | POA: Diagnosis not present

## 2015-07-04 DIAGNOSIS — I2699 Other pulmonary embolism without acute cor pulmonale: Principal | ICD-10-CM | POA: Diagnosis present

## 2015-07-04 DIAGNOSIS — R0602 Shortness of breath: Secondary | ICD-10-CM | POA: Diagnosis present

## 2015-07-04 LAB — CBC WITH DIFFERENTIAL/PLATELET
Basophils Absolute: 0.1 10*3/uL (ref 0.0–0.1)
Basophils Relative: 1 %
EOS ABS: 0.1 10*3/uL (ref 0.0–0.7)
Eosinophils Relative: 1 %
HCT: 29.8 % — ABNORMAL LOW (ref 39.0–52.0)
Hemoglobin: 10 g/dL — ABNORMAL LOW (ref 13.0–17.0)
LYMPHS ABS: 0.9 10*3/uL (ref 0.7–4.0)
Lymphocytes Relative: 15 %
MCH: 27.7 pg (ref 26.0–34.0)
MCHC: 33.6 g/dL (ref 30.0–36.0)
MCV: 82.5 fL (ref 78.0–100.0)
MONO ABS: 0.5 10*3/uL (ref 0.1–1.0)
Monocytes Relative: 8 %
NEUTROS ABS: 4.3 10*3/uL (ref 1.7–7.7)
Neutrophils Relative %: 75 %
PLATELETS: 200 10*3/uL (ref 150–400)
RBC: 3.61 MIL/uL — AB (ref 4.22–5.81)
RDW: 23.8 % — AB (ref 11.5–15.5)
WBC: 5.9 10*3/uL (ref 4.0–10.5)
nRBC: 9 /100 WBC — ABNORMAL HIGH

## 2015-07-04 LAB — HEPATIC FUNCTION PANEL
ALK PHOS: 41 U/L (ref 38–126)
ALT: 21 U/L (ref 17–63)
AST: 25 U/L (ref 15–41)
Albumin: 3.7 g/dL (ref 3.5–5.0)
Bilirubin, Direct: <0.1 mg/dL — ABNORMAL LOW (ref 0.1–0.5)
TOTAL PROTEIN: 6.6 g/dL (ref 6.5–8.1)
Total Bilirubin: 0.2 mg/dL — ABNORMAL LOW (ref 0.3–1.2)

## 2015-07-04 LAB — COMPREHENSIVE METABOLIC PANEL
ALK PHOS: 44 U/L (ref 38–126)
ALT: 22 U/L (ref 17–63)
AST: 32 U/L (ref 15–41)
Albumin: 3.8 g/dL (ref 3.5–5.0)
Anion gap: 9 (ref 5–15)
BUN: 20 mg/dL (ref 6–20)
CALCIUM: 8.8 mg/dL — AB (ref 8.9–10.3)
CO2: 24 mmol/L (ref 22–32)
CREATININE: 1.44 mg/dL — AB (ref 0.61–1.24)
Chloride: 107 mmol/L (ref 101–111)
GFR, EST AFRICAN AMERICAN: 56 mL/min — AB (ref >60)
GFR, EST NON AFRICAN AMERICAN: 48 mL/min — AB (ref >60)
Glucose, Bld: 137 mg/dL — ABNORMAL HIGH (ref 65–99)
Potassium: 3.7 mmol/L (ref 3.5–5.1)
Sodium: 140 mmol/L (ref 135–145)
Total Bilirubin: 0.3 mg/dL (ref 0.3–1.2)
Total Protein: 7 g/dL (ref 6.5–8.1)

## 2015-07-04 LAB — I-STAT CHEM 8, ED
BUN: 22 mg/dL — ABNORMAL HIGH (ref 6–20)
CHLORIDE: 105 mmol/L (ref 101–111)
CREATININE: 1.3 mg/dL — AB (ref 0.61–1.24)
Calcium, Ion: 1.11 mmol/L — ABNORMAL LOW (ref 1.13–1.30)
GLUCOSE: 136 mg/dL — AB (ref 65–99)
HCT: 32 % — ABNORMAL LOW (ref 39.0–52.0)
Hemoglobin: 10.9 g/dL — ABNORMAL LOW (ref 13.0–17.0)
POTASSIUM: 3.7 mmol/L (ref 3.5–5.1)
Sodium: 141 mmol/L (ref 135–145)
TCO2: 23 mmol/L (ref 0–100)

## 2015-07-04 LAB — HEPARIN LEVEL (UNFRACTIONATED): HEPARIN UNFRACTIONATED: 0.58 [IU]/mL (ref 0.30–0.70)

## 2015-07-04 LAB — TROPONIN I
Troponin I: 0.08 ng/mL — ABNORMAL HIGH (ref <0.031)
Troponin I: 0.08 ng/mL — ABNORMAL HIGH (ref <0.031)

## 2015-07-04 LAB — MAGNESIUM: Magnesium: 1.9 mg/dL (ref 1.7–2.4)

## 2015-07-04 LAB — PROTIME-INR
INR: 1.1 (ref 0.00–1.49)
Prothrombin Time: 14.4 seconds (ref 11.6–15.2)

## 2015-07-04 LAB — I-STAT TROPONIN, ED: Troponin i, poc: 0.08 ng/mL (ref 0.00–0.08)

## 2015-07-04 LAB — BRAIN NATRIURETIC PEPTIDE: B NATRIURETIC PEPTIDE 5: 581.8 pg/mL — AB (ref 0.0–100.0)

## 2015-07-04 LAB — MRSA PCR SCREENING: MRSA BY PCR: NEGATIVE

## 2015-07-04 MED ORDER — HEPARIN BOLUS VIA INFUSION
5000.0000 [IU] | Freq: Once | INTRAVENOUS | Status: AC
  Administered 2015-07-04: 5000 [IU] via INTRAVENOUS
  Filled 2015-07-04: qty 5000

## 2015-07-04 MED ORDER — ONDANSETRON HCL 4 MG PO TABS: 4.0000 mg | ORAL_TABLET | Freq: Four times a day (QID) | ORAL | Status: DC | PRN

## 2015-07-04 MED ORDER — HEPARIN (PORCINE) IN NACL 100-0.45 UNIT/ML-% IJ SOLN
1400.0000 [IU]/h | INTRAMUSCULAR | Status: DC
  Administered 2015-07-04 – 2015-07-05 (×3): 1400 [IU]/h via INTRAVENOUS
  Filled 2015-07-04 (×4): qty 250

## 2015-07-04 MED ORDER — SODIUM CHLORIDE 0.9 % IJ SOLN
3.0000 mL | Freq: Two times a day (BID) | INTRAMUSCULAR | Status: DC
  Administered 2015-07-06 – 2015-07-07 (×2): 3 mL via INTRAVENOUS

## 2015-07-04 MED ORDER — SODIUM CHLORIDE 0.9 % IV BOLUS (SEPSIS)
999.0000 mL | Freq: Once | INTRAVENOUS | Status: AC
  Administered 2015-07-04: 1000 mL via INTRAVENOUS

## 2015-07-04 MED ORDER — HYDROMORPHONE HCL 1 MG/ML IJ SOLN
1.0000 mg | INTRAMUSCULAR | Status: DC | PRN
  Administered 2015-07-04 – 2015-07-08 (×12): 1 mg via INTRAVENOUS
  Filled 2015-07-04 (×12): qty 1

## 2015-07-04 MED ORDER — ACETAMINOPHEN 325 MG PO TABS: 650.0000 mg | ORAL_TABLET | Freq: Four times a day (QID) | ORAL | Status: DC | PRN

## 2015-07-04 MED ORDER — POLYETHYLENE GLYCOL 3350 17 G PO PACK
17.0000 g | PACK | Freq: Every day | ORAL | Status: DC | PRN
  Administered 2015-07-05: 17 g via ORAL
  Filled 2015-07-04 (×3): qty 1

## 2015-07-04 MED ORDER — LEVALBUTEROL HCL 0.63 MG/3ML IN NEBU: 0.6300 mg | INHALATION_SOLUTION | Freq: Four times a day (QID) | RESPIRATORY_TRACT | Status: DC | PRN

## 2015-07-04 MED ORDER — SODIUM CHLORIDE 0.9 % IV SOLN
INTRAVENOUS | Status: DC
  Administered 2015-07-04 (×2): via INTRAVENOUS
  Administered 2015-07-05: 125 mL/h via INTRAVENOUS

## 2015-07-04 MED ORDER — IOHEXOL 350 MG/ML SOLN
100.0000 mL | Freq: Once | INTRAVENOUS | Status: AC | PRN
  Administered 2015-07-04: 62 mL via INTRAVENOUS

## 2015-07-04 MED ORDER — SODIUM CHLORIDE 0.9 % IV BOLUS (SEPSIS)
1000.0000 mL | Freq: Once | INTRAVENOUS | Status: AC
  Administered 2015-07-04: 1000 mL via INTRAVENOUS

## 2015-07-04 MED ORDER — ONDANSETRON HCL 4 MG/2ML IJ SOLN: 4.0000 mg | Freq: Four times a day (QID) | INTRAMUSCULAR | Status: DC | PRN

## 2015-07-04 MED ORDER — ACETAMINOPHEN 650 MG RE SUPP: 650.0000 mg | Freq: Four times a day (QID) | RECTAL | Status: DC | PRN

## 2015-07-04 NOTE — ED Notes (Signed)
Pt complaining of generalized abd pain.  Pt states that this is not new, it has been going on for a month now which his doctors are aware of.  He states that he thinks it's from the chemo pills that he's taking.  Regenia Skeeter EDO made aware

## 2015-07-04 NOTE — ED Notes (Signed)
Bed: WA07 Expected date:  Expected time:  Means of arrival:  Comments: New pt

## 2015-07-04 NOTE — Progress Notes (Signed)
*  PRELIMINARY RESULTS* Vascular Ultrasound Lower extremity venous duplex has been completed.  Preliminary findings: Short segment of DVT noted in the left posterior tibial veins. No DVT RLE.    Landry Mellow, RDMS, RVT  07/04/2015, 2:01 PM

## 2015-07-04 NOTE — H&P (Signed)
[facility] History and Physical  [name]. IWL:[id number] DOB: Feb 24, 1946 DOA: 07/04/2015  Referring physician: * PCP: Simona Huh, MD   Chief Complaint: Shortness of breath HPI:  69 year old male with a history of prostrate cancer since 2001, hypertension presenting with shortness of breath, 2-3 weeks week.room air sats of 85% (does not wear O2 at night).. Prior history of DVT after total knee replacement. Patient is currently receiving Lupron injections and chemotherapy under the care of Dr. Alen Blew. Patient has had some chronic dyspnea. Was scheduled to get a CT scan today that showed bilateral large acute pulmonary embolism with right heart strain, no evidence of pleural effusions. no chest pain, no cough, no hemoptysis. He does complain of dizziness.     Review of Systems: negative for the following  Gen: Denies fever, chills, weight change, fatigue, night sweats HEENT: Denies blurred vision, double vision, hearing loss, tinnitus, sinus congestion, rhinorrhea, sore throat, neck stiffness, dysphagia PULM: Per history of present illness CV: Positive for chest pain, edema, orthopnea, paroxysmal nocturnal dyspnea, palpitations GI: Denies abdominal pain, nausea, vomiting, diarrhea, hematochezia, melena, constipation, change in bowel habits GU: Denies dysuria, hematuria, polyuria, oliguria, urethral discharge Endocrine: Denies hot or cold intolerance, polyuria, polyphagia or appetite change Derm: Denies rash, dry skin, scaling or peeling skin change Heme: Denies easy bruising, bleeding, bleeding gums Neuro: Denies headache, numbness, weakness, slurred speech, loss of memory or consciousness Hematological: Denies adenopathy. Easy bruising, personal or family bleeding history  Psychiatric/Behavioral: Denies suicidal ideation, mood changes, confusion, nervousness, sleep disturbance and agitation       Past Medical History  Diagnosis Date  . Hypertension   .  Osteoarthritis   . Retroperitoneal lymphadenopathy   . History of pulmonary embolus (PE)     dec 2009  . History of DVT (deep vein thrombosis)     dec 2009  . OSA on CPAP   . History of acute renal failure     W/ SEPSIS  MARCH 2014  . Urgency of urination   . ED (erectile dysfunction)   . Left renal mass     MONITORED BY UROLOGIST-  DR Diona Fanti  . Hydronephrosis, right   . Prostate cancer ONCOLOGIST-  DR Alen Blew    DX 2001 (T3 N1)-- S/P PROSTATECTOMY--/ BIOCHEMICAL RELAPSE, RECEIVED PROSTATIC BED RADIATION AND LUPRON WITH CASODEX   SINCE DEVELOPED  CASTATION RESISITENT METASTATIC PROSTATE ADENOCARINOMA--  CURRENT THERAPY CHEMOTHEAPY EVERY 3 WEEKS AND LUPRON INJECTIONS EVERY 4 MONTHS  . Fatigue   . Chemotherapy-induced nausea   . Metastasis to bone     PRIMARY PROSTATE CANCER     Past Surgical History  Procedure Laterality Date  . Total knee arthroplasty Bilateral RIGHT  04-24-2009/   LEFT  2005  . Insertion ivc filter and removal  03-25-2009/   REMOVAL 05-29-2009  . Prostatectomy  MAY 2001  . Transthoracic echocardiogram  12-27-2012    MILD LVH/  EF 17%/  GRADE I DIASTOLIC DYSFUNCTION  . Inguinal hernia repair Right 2010  . Penile prosthesis implant  2005  . Cystoscopy w/ ureteral stent placement Right 10/22/2013    Procedure: CYSTOSCOPY WITH STENT REPLACEMENT;  Surgeon: Franchot Gallo, MD;  Location: [facility];  Service: Urology;  Laterality: Right;  . Portacath placement  02-06-2014  . Colonoscopy w/ polypectomy  05-23-2013  . Cystoscopy w/ ureteral stent placement Right 06/24/2014    Procedure: CYSTOSCOPY WITH RETROGRADE PYELOGRAM/ URETERAL STENT REPLACEMENT WITH URETERAL DILATION, RIGHT;  Surgeon: Jorja Loa, MD;  Location: [facility];  Service: Urology;  Laterality: Right;  . Cystoscopy w/ ureteral stent placement Right 11/22/2014    Procedure: CYSTOSCOPY WITH STENT REPLACEMENT;  Surgeon: Jorja Loa, MD;  Location: [facility];  Service: Urology;  Laterality: Right;      Social History:  reports that he has never smoked. He has never used smokeless tobacco. He reports that he drinks about 3.6 oz of alcohol per week. He reports that he does not use illicit drugs.    No Known Allergies  Family History  Problem Relation Age of Onset  . Colon cancer Neg Hx   . Heart disease Mother          Prior to Admission medications   Medication Sig Start Date End Date Taking? Authorizing Provider  aspirin EC 81 MG tablet Take 162 mg by mouth every evening.    Yes Historical Provider, MD  calcium carbonate (OS-CAL) 1250 MG chewable tablet Chew 1 tablet by mouth 2 (two) times daily.    Yes Historical Provider, MD  leuprolide (LUPRON) 30 MG injection Inject 30 mg into the muscle every 4 (four) months. LAST INJECTION JAN 2016   Yes Historical Provider, MD  lidocaine-prilocaine (EMLA) cream Apply topically as needed. Patient taking differently: Apply 1 application topically as needed (for port).  06/05/14  Yes [name], MD  loratadine (CLARITIN) 10 MG tablet Take 10 mg by mouth daily.   Yes Historical Provider, MD  losartan-hydrochlorothiazide (HYZAAR) 50-12.5 MG per tablet Take 1 tablet by mouth every evening.  10/21/14  Yes Historical Provider, MD  olaparib (LYNPARZA) 50 MG capsule Take 400 mg by mouth 2 (two) times daily. Swallow whole.   Yes Historical Provider, MD  ondansetron (ZOFRAN) 8 MG tablet Take 1 tablet (8 mg total) by mouth every 8 (eight) hours as needed for nausea or vomiting. 04/24/15  Yes Adrena E Johnson, PA-C  oxyCODONE (OXY IR/ROXICODONE) 5 MG immediate release tablet Take 1 tablet (5 mg total) by mouth every 4 (four) hours as needed for severe pain. 07/02/15  Yes [name], MD  mirabegron ER (MYRBETRIQ) 50 MG TB24 tablet Take 50 mg by mouth daily. 11/22/14   Historical Provider, MD     Physical Exam: Filed Vitals:   07/04/15 1036 07/04/15 1120 07/04/15 1242 07/04/15 1256   BP: 90/63 97/66  104/69  Pulse: 94 91  93  Temp: 97.6 F (36.4 C) 97.6 F (36.4 C)  98 F (36.7 C)  TempSrc: Oral Oral  Oral  Resp: 12 20  24   Weight:   86.183 kg (190 lb)   SpO2: 87% 93%  87%     Constitutional: Vital signs reviewed. Patient is a well-developed and well-nourished in no acute distress and cooperative with exam. Alert and oriented x3.  Head: Normocephalic and atraumatic  Ear: TM normal bilaterally  Mouth: no erythema or exudates, MMM  Eyes: PERRL, EOMI, conjunctivae normal, No scleral icterus.  Neck: Supple, Trachea midline normal ROM, No JVD, mass, thyromegaly, or carotid bruit present.  Cardiovascular: RRR, S1 normal, S2 normal, no MRG, pulses symmetric and intact bilaterally  Pulmonary/Chest: CTAB, no wheezes, rales, or rhonchi  Abdominal: Soft. Non-tender, non-distended, bowel sounds are normal, no masses, organomegaly, or guarding present.  GU: no CVA tenderness Musculoskeletal: No joint deformities, erythema, or stiffness, ROM full and no nontender Ext: He exhibits edema (trace pedal edema bilaterally. No asymmetric leg swelling, pulses palpable bilaterally (DP and PT)  Hematology: no cervical, inginal, or axillary adenopathy.  Neurological: A&O x3, Strenght is normal and symmetric bilaterally, cranial nerve II-XII are grossly intact, no focal motor deficit, sensory intact to light touch bilaterally.  Skin: Warm, dry and intact. No rash, cyanosis, or clubbing.  Psychiatric: Normal mood and affect. speech and behavior is normal. Judgment and thought content normal. Cognition and memory are normal.      Data Review   Micro Results Recent Results (from the past 240 hour(s))  TECHNOLOGIST REVIEW     Status: None   Collection Time: 07/01/15  9:04 AM  Result Value Ref Range Status   Technologist Review   Final    Sl poly, Mod ovalos, Few Schistocytes, helmet, target, and burr cells    Radiology Reports Dg Chest 2 View  07/02/2015   CLINICAL DATA:   Shortness of breath.  Prostate cancer.  EXAM: CHEST  2 VIEW  COMPARISON:  CT 06/04/2014 .  FINDINGS: PowerPort catheter noted with tip projected over the superior vena cava. Mediastinum is stable. Right hilar fullness noted. Mild adenopathy cannot be excluded. Cardiomegaly with the mild pulmonary vascular congestion. No evidence of overt congestive heart failure. No focal infiltrate. No pleural effusion or pneumothorax. No acute bony abnormality. Degenerative change thoracic spine. Right ureteral stent noted .  IMPRESSION: 1. Power port catheter in good anatomic position. 2. Mild right hilar fullness, adenopathy cannot be excluded. 3. Cardiomegaly. Mild pulmonary venous congestion. No evidence of overt congestive heart failure .   Electronically Signed   By: Marcello Moores  Register   On: 07/02/2015 11:30   Ct Angio Chest Pe W/cm &/or Wo Cm  07/04/2015   CLINICAL DATA:  Shortness of breath.  EXAM: CT ANGIOGRAPHY CHEST WITH CONTRAST  TECHNIQUE: Multidetector CT imaging of the chest was performed using the standard protocol during bolus administration of intravenous contrast. Multiplanar CT image reconstructions and MIPs were obtained to evaluate the vascular anatomy.  CONTRAST:  38mL OMNIPAQUE IOHEXOL 350 MG/ML SOLN  COMPARISON:  CT scan of January 22, 2014.  FINDINGS: No pneumothorax is noted. Minimal bilateral pleural effusions are noted with adjacent subsegmental atelectasis. 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 There is no evidence of thoracic aortic dissection or aneurysm. Large bilateral pulmonary emboli are noted. RV/LV ratio greater than 2 is noted suggesting right heart strain. Right internal jugular Port-A-Cath is noted with distal tip at expected position of the cavoatrial junction. No significant osseous abnormality is noted.  Within visualized portion of the abdomen, enlarged retroperitoneal adenopathy is noted concerning for metastatic disease. Largest lymph node visualized measures 4.1 x 3.9 cm.  Review of the  MIP images confirms the above findings.  IMPRESSION: Enlarged retroperitoneal adenopathy is noted within the visualized portion of the abdomen consistent with metastatic disease.  Positive for bilateral large acute PE with CT evidence of right heart strain (RV/LV Ratio = 2) consistent with at least submassive (intermediate risk) PE. The presence of right heart strain has been associated with an increased risk of morbidity and mortality. Please activate Code PE by paging [phone]. Critical Value/emergent results were called by telephone at the time of interpretation on 07/04/2015 at 12:35 pm to Dr. Sherwood Gambler , who verbally acknowledged these results.   Electronically Signed   By: Marijo Conception, [name]   On: 07/04/2015 12:35     CBC  Recent Labs Lab 07/01/15 0904 07/04/15 1100 07/04/15 1103  WBC 4.8 5.9  --   HGB 9.1* 10.0* 10.9*  HCT 28.1* 29.8* 32.0*  PLT 145 200  --   MCV 82.6  82.5  --   MCH 26.8* 27.7  --   MCHC 32.4 33.6  --   RDW 23.4* 23.8*  --   LYMPHSABS 1.0 0.9  --   MONOABS 0.4 0.5  --   EOSABS 0.1 0.1  --   BASOSABS 0.0 0.1  --     Chemistries   Recent Labs Lab 07/01/15 0905 07/04/15 1100 07/04/15 1103  NA 143 140 141  K 3.6 3.7 3.7  CL  --  107 105  CO2 25 24  --   GLUCOSE 129 137* 136*  BUN 15.2 20 22*  CREATININE 1.3 1.44* 1.30*  CALCIUM 8.8 8.8*  --   AST 14 32  --   ALT 10 22  --   ALKPHOS 38* 44  --   BILITOT 0.44 0.3  --    ------------------------------------------------------------------------------------------------------------------ estimated creatinine clearance is 55.2 mL/min (by C-G formula based on Cr of 1.3). ------------------------------------------------------------------------------------------------------------------ No results for input(s): HGBA1C in the last 72 hours. ------------------------------------------------------------------------------------------------------------------ No results for input(s): CHOL, HDL, LDLCALC,  TRIG, CHOLHDL, LDLDIRECT in the last 72 hours. ------------------------------------------------------------------------------------------------------------------ No results for input(s): TSH, T4TOTAL, T3FREE, THYROIDAB in the last 72 hours.  Invalid input(s): FREET3 ------------------------------------------------------------------------------------------------------------------ No results for input(s): VITAMINB12, FOLATE, FERRITIN, TIBC, IRON, RETICCTPCT in the last 72 hours.  Coagulation profile  Recent Labs Lab 07/04/15 1100  INR 1.10    No results for input(s): DDIMER in the last 72 hours.  Cardiac Enzymes No results for input(s): CKMB, TROPONINI, MYOGLOBIN in the last 168 hours.  Invalid input(s): CK ------------------------------------------------------------------------------------------------------------------ Invalid input(s): POCBNP   CBG: No results for input(s): GLUCAP in the last 168 hours.     EKG: Independently reviewed.     Assessment/Plan Acute submassive pulmonary embolism-seen by pulmonary critical care, admitted to step down, no clear evidence of thrombolytics, pulmonary recommends heparin drip, 2-D echo to assess RV size and function, bilateral lower extremity Doppler, keep oxygen saturation greater than 92%, transition to NOAC vs Lovenox   Hypertension-hold antihypertensives medications-hold Hyzaar    Prostate cancer-Currently receiving Lupron injections, hold  Chronic kidney disease, stage II-baseline creatinine around 1.4, continue to monitor  Code Status:   full Family Communication: bedside Disposition Plan: admit   Total time spent 55 minutes.Greater than 50% of this time was spent in counseling, explanation of diagnosis, planning of further management, and coordination of care  [facility] Pager [phone]  If 7PM-7AM, please contact night-coverage [email or web address] Password [email or web address] 07/04/2015, 1:17 PM

## 2015-07-04 NOTE — ED Notes (Addendum)
Pt reports SOB with exertion x 1 week.  Denies any lung problems at this time.  Pt also reports feeling dizzy when standing up.  Reports cp also with SOB.   Pt reports hx of PE in the past.

## 2015-07-04 NOTE — ED Provider Notes (Signed)
CSN: [id number]     Arrival date & time 07/04/15  1016 History   First MD Initiated Contact with Patient 07/04/15 1033     Chief Complaint  Patient presents with  . Shortness of Breath     (Consider location/radiation/quality/duration/timing/severity/associated sxs/prior Treatment) HPI  69 year old male presents with progressive dyspnea over the past 1 week. For the most part the dyspnea only occurs with any type of exertion although it occurs quite quickly. Feels lightheaded when standing up. After walking for a while he does get chest pain after he has started having shortness of breath. Denies any cough or fevers. No asymmetric leg swelling. Patient has chronic mild lower extremity swelling that is not different than typical. He has a history of prostate cancer. He does have a history of a PE in 2009, does not remember what this felt like it so he is not sure if this is similar. Went to his doctor this AM and was sent here due to room air sats of 85% (does not wear O2 at night).  Past Medical History  Diagnosis Date  . Hypertension   . Osteoarthritis   . Retroperitoneal lymphadenopathy   . History of pulmonary embolus (PE)     dec 2009  . History of DVT (deep vein thrombosis)     dec 2009  . OSA on CPAP   . History of acute renal failure     W/ SEPSIS  MARCH 2014  . Urgency of urination   . ED (erectile dysfunction)   . Left renal mass     MONITORED BY UROLOGIST-  DR Diona Fanti  . Hydronephrosis, right   . Prostate cancer ONCOLOGIST-  DR Alen Blew    DX 2001 (T3 N1)-- S/P PROSTATECTOMY--/ BIOCHEMICAL RELAPSE, RECEIVED PROSTATIC BED RADIATION AND LUPRON WITH CASODEX   SINCE DEVELOPED  CASTATION RESISITENT METASTATIC PROSTATE ADENOCARINOMA--  CURRENT THERAPY CHEMOTHEAPY EVERY 3 WEEKS AND LUPRON INJECTIONS EVERY 4 MONTHS  . Fatigue   . Chemotherapy-induced nausea   . Metastasis to bone     PRIMARY PROSTATE CANCER   Past Surgical History  Procedure Laterality Date  . Total knee  arthroplasty Bilateral RIGHT  04-24-2009/   LEFT  2005  . Insertion ivc filter and removal  03-25-2009/   REMOVAL 05-29-2009  . Prostatectomy  MAY 2001  . Transthoracic echocardiogram  12-27-2012    MILD LVH/  EF 23%/  GRADE I DIASTOLIC DYSFUNCTION  . Inguinal hernia repair Right 2010  . Penile prosthesis implant  2005  . Cystoscopy w/ ureteral stent placement Right 10/22/2013    Procedure: CYSTOSCOPY WITH STENT REPLACEMENT;  Surgeon: Franchot Gallo, MD;  Location: [facility];  Service: Urology;  Laterality: Right;  . Portacath placement  02-06-2014  . Colonoscopy w/ polypectomy  05-23-2013  . Cystoscopy w/ ureteral stent placement Right 06/24/2014    Procedure: CYSTOSCOPY WITH RETROGRADE PYELOGRAM/ URETERAL STENT REPLACEMENT WITH URETERAL DILATION, RIGHT;  Surgeon: Jorja Loa, MD;  Location: [facility];  Service: Urology;  Laterality: Right;  . Cystoscopy w/ ureteral stent placement Right 11/22/2014    Procedure: CYSTOSCOPY WITH STENT REPLACEMENT;  Surgeon: Jorja Loa, MD;  Location: [facility];  Service: Urology;  Laterality: Right;   Family History  Problem Relation Age of Onset  . Colon cancer Neg Hx   . Heart disease Mother    Social History  Substance Use Topics  . Smoking status: Never Smoker   . Smokeless tobacco: Never Used  . Alcohol Use:  3.6 oz/week    6 Cans of beer per week    Review of Systems  Constitutional: Negative for fever.  Respiratory: Positive for shortness of breath. Negative for cough.   Cardiovascular: Positive for chest pain. Negative for leg swelling.  All other systems reviewed and are negative.     Allergies  Review of patient's allergies indicates no known allergies.  Home Medications   Prior to Admission medications   Medication Sig Start Date End Date Taking? Authorizing Provider  aspirin EC 81 MG tablet Take 162 mg by mouth daily.    Historical Provider, MD  calcium  carbonate (OS-CAL) 1250 MG chewable tablet Chew 2 tablets by mouth daily.    Historical Provider, MD  leuprolide (LUPRON) 30 MG injection Inject 30 mg into the muscle every 4 (four) months. LAST INJECTION JAN 2016    Historical Provider, MD  lidocaine-prilocaine (EMLA) cream Apply topically as needed. 06/05/14   [name], MD  loratadine (CLARITIN) 10 MG tablet Take 10 mg by mouth daily.    Historical Provider, MD  losartan-hydrochlorothiazide (HYZAAR) 50-12.5 MG per tablet Take 1 tablet by mouth daily. 10/21/14   Historical Provider, MD  mirabegron ER (MYRBETRIQ) 50 MG TB24 tablet Take 50 mg by mouth daily. 11/22/14   Historical Provider, MD  olaparib (LYNPARZA) 50 MG capsule Take 400 mg by mouth 2 (two) times daily. Swallow whole.    Historical Provider, MD  ondansetron (ZOFRAN) 8 MG tablet Take 1 tablet (8 mg total) by mouth every 8 (eight) hours as needed for nausea or vomiting. 04/24/15   Carlton Adam, PA-C  oxyCODONE (OXY IR/ROXICODONE) 5 MG immediate release tablet Take 1 tablet (5 mg total) by mouth every 4 (four) hours as needed for severe pain. 07/02/15   [name], MD   BP 90/63 mmHg  Pulse 94  Temp(Src) 97.6 F (36.4 C) (Oral)  Resp 12  SpO2 87% Physical Exam  Constitutional: He is oriented to person, place, and time. He appears well-developed and well-nourished.  HENT:  Head: Normocephalic and atraumatic.  Right Ear: External ear normal.  Left Ear: External ear normal.  Nose: Nose normal.  Eyes: Right eye exhibits no discharge. Left eye exhibits no discharge.  Neck: Neck supple.  Cardiovascular: Normal rate, regular rhythm, normal heart sounds and intact distal pulses.   Pulmonary/Chest: Effort normal and breath sounds normal.  Abdominal: Soft. He exhibits no distension. There is no tenderness.  Musculoskeletal: He exhibits edema (trace pedal edema bilaterally. No asymmetric leg swelling).  Neurological: He is alert and oriented to person, place, and time.  Skin:  Skin is warm and dry.  Nursing note and vitals reviewed.   ED Course  Procedures (including critical care time) Labs Review Labs Reviewed  COMPREHENSIVE METABOLIC PANEL - Abnormal; Notable for the following:    Glucose, Bld 137 (*)    Creatinine, Ser 1.44 (*)    Calcium 8.8 (*)    GFR calc non Af Amer 48 (*)    GFR calc Af Amer 56 (*)    All other components within normal limits  BRAIN NATRIURETIC PEPTIDE - Abnormal; Notable for the following:    B Natriuretic Peptide 581.8 (*)    All other components within normal limits  CBC WITH DIFFERENTIAL/PLATELET - Abnormal; Notable for the following:    RBC 3.61 (*)    Hemoglobin 10.0 (*)    HCT 29.8 (*)    RDW 23.8 (*)    nRBC 9 (*)  All other components within normal limits  HEPATIC FUNCTION PANEL - Abnormal; Notable for the following:    Total Bilirubin 0.2 (*)    Bilirubin, Direct <0.1 (*)    All other components within normal limits  TROPONIN I - Abnormal; Notable for the following:    Troponin I 0.08 (*)    All other components within normal limits  I-STAT CHEM 8, ED - Abnormal; Notable for the following:    BUN 22 (*)    Creatinine, Ser 1.30 (*)    Glucose, Bld 136 (*)    Calcium, Ion 1.11 (*)    Hemoglobin 10.9 (*)    HCT 32.0 (*)    All other components within normal limits  PROTIME-INR  MAGNESIUM  HEPARIN LEVEL (UNFRACTIONATED)  TROPONIN I  TROPONIN I  I-STAT TROPOININ, ED    Imaging Review Ct Angio Chest Pe W/cm &/or Wo Cm  07/04/2015   CLINICAL DATA:  Shortness of breath.  EXAM: CT ANGIOGRAPHY CHEST WITH CONTRAST  TECHNIQUE: Multidetector CT imaging of the chest was performed using the standard protocol during bolus administration of intravenous contrast. Multiplanar CT image reconstructions and MIPs were obtained to evaluate the vascular anatomy.  CONTRAST:  78mL OMNIPAQUE IOHEXOL 350 MG/ML SOLN  COMPARISON:  CT scan of January 22, 2014.  FINDINGS: No pneumothorax is noted. Minimal bilateral pleural effusions are  noted with adjacent subsegmental atelectasis. 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 There is no evidence of thoracic aortic dissection or aneurysm. Large bilateral pulmonary emboli are noted. RV/LV ratio greater than 2 is noted suggesting right heart strain. Right internal jugular Port-A-Cath is noted with distal tip at expected position of the cavoatrial junction. No significant osseous abnormality is noted.  Within visualized portion of the abdomen, enlarged retroperitoneal adenopathy is noted concerning for metastatic disease. Largest lymph node visualized measures 4.1 x 3.9 cm.  Review of the MIP images confirms the above findings.  IMPRESSION: Enlarged retroperitoneal adenopathy is noted within the visualized portion of the abdomen consistent with metastatic disease.  Positive for bilateral large acute PE with CT evidence of right heart strain (RV/LV Ratio = 2) consistent with at least submassive (intermediate risk) PE. The presence of right heart strain has been associated with an increased risk of morbidity and mortality. Please activate Code PE by paging [phone]. Critical Value/emergent results were called by telephone at the time of interpretation on 07/04/2015 at 12:35 pm to Dr. Sherwood Gambler , who verbally acknowledged these results.   Electronically Signed   By: Marijo Conception, [name]   On: 07/04/2015 12:35   I have personally reviewed and evaluated these images and lab results as part of my medical decision-making.   EKG Interpretation   Date/Time:  Friday July 04 2015 10:24:27 EDT Ventricular Rate:  94 PR Interval:  158 QRS Duration: 95 QT Interval:  420 QTC Calculation: 525 R Axis:   -107 Text Interpretation:  Sinus rhythm Atrial premature complexes Probable  anterior infarct, age indeterminate Prolonged QT interval nonspecific T  waves more flat compared to 2015 Confirmed by GOLDSTON  MD, SCOTT ([id number])  on 07/04/2015 11:10:55 AM      CRITICAL CARE Performed by: Sherwood Gambler T   Total critical care time: 40 minutes  Critical care time was exclusive of separately billable procedures and treating other patients.  Critical care was necessary to treat or prevent imminent or life-threatening deterioration.  Critical care was time spent personally by me on the following activities: development of treatment plan with  patient and/or surrogate as well as nursing, discussions with consultants, evaluation of patient's response to treatment, examination of patient, obtaining history from patient or surrogate, ordering and performing treatments and interventions, ordering and review of laboratory studies, ordering and review of radiographic studies, pulse oximetry and re-evaluation of patient's condition.  MDM   Final diagnoses:  Pulmonary embolism    Patient symptoms are consistent with an acute PE. Patient has submassive PE with RV strain. Mild elevation of BNP and borderline troponin. Initially his blood pressures are low in the 37H systolic. He is also hypoxic. Hypoxia resolved with oxygen therapy and his blood pressure responded well to 1 L IV fluid bolus. No acute ischemia seen eyes EKG. No saddle embolus but does have heavy clot burden. No contraindications to heparin when talking to the patient and thus he will be started on heparin. Pulmonology, Dr. Lake Bells has been consulted and seen the patient has made recommendations. Plan for hospitalist admission to the stepdown unit.    Sherwood Gambler, MD 07/04/15 1524

## 2015-07-04 NOTE — Progress Notes (Signed)
  Echocardiogram 2D Echocardiogram has been performed.  [name] M 07/04/2015, 3:05 PM

## 2015-07-04 NOTE — ED Notes (Signed)
Pt reports SOB x 1 week.  Takes chemo pills daily for prostate cancer.  Pt reports feeling lightheaded only when standing up.  Pt is A&Ox 4 at this time.  Denies any other complaints.

## 2015-07-04 NOTE — Progress Notes (Addendum)
ANTICOAGULATION CONSULT NOTE - Initial Consult  Pharmacy Consult for heparin Indication: new  pulmonary embolus  No Known Allergies  Patient Measurements: Weight: 190 lb (86.183 kg) Heparin Dosing Weight: 83 kg  Vital Signs: Temp: 98 F (36.7 C) (09/23 1256) Temp Source: Oral (09/23 1256) BP: 104/69 mmHg (09/23 1256) Pulse Rate: 93 (09/23 1256)  Labs:  Recent Labs  07/04/15 1100 07/04/15 1103  HGB 10.0* 10.9*  HCT 29.8* 32.0*  PLT 200  --   LABPROT 14.4  --   INR 1.10  --   CREATININE 1.44* 1.30*    Estimated Creatinine Clearance: 55.2 mL/min (by C-G formula based on Cr of 1.3).   Medical History: Past Medical History  Diagnosis Date  . Hypertension   . Osteoarthritis   . Retroperitoneal lymphadenopathy   . History of pulmonary embolus (PE)     dec 2009  . History of DVT (deep vein thrombosis)     dec 2009  . OSA on CPAP   . History of acute renal failure     W/ SEPSIS  MARCH 2014  . Urgency of urination   . ED (erectile dysfunction)   . Left renal mass     MONITORED BY UROLOGIST-  DR Diona Fanti  . Hydronephrosis, right   . Prostate cancer ONCOLOGIST-  DR Alen Blew    DX 2001 (T3 N1)-- S/P PROSTATECTOMY--/ BIOCHEMICAL RELAPSE, RECEIVED PROSTATIC BED RADIATION AND LUPRON WITH CASODEX   SINCE DEVELOPED  CASTATION RESISITENT METASTATIC PROSTATE ADENOCARINOMA--  CURRENT THERAPY CHEMOTHEAPY EVERY 3 WEEKS AND LUPRON INJECTIONS EVERY 4 MONTHS  . Fatigue   . Chemotherapy-induced nausea   . Metastasis to bone     PRIMARY PROSTATE CANCER    Assessment: Patient's a 69 y.o M with hx PE and metastatic prostate cancer on lunparza and lupron PTA.  He presented to the ED on 9/23 with c/o SOB.  Chest CTA showed bilateral large acute PE with right heart strain.  To start heparin for new PE.   Goal of Therapy:  Heparin level 0.3-0.7 units/ml Monitor platelets by anticoagulation protocol: Yes   Plan:  - heparin 5000 units IV bolus, then drip at 1400 units/hr - check 6  hour heparin level  Pham, Anh P 07/04/2015,1:00 PM

## 2015-07-04 NOTE — Progress Notes (Signed)
RT placed patient on CPAP with auto setting of 6-18 cmH20. Sterile water was added to water chamber for humidification. Patient is tolerating well. RT will continue to monitor!

## 2015-07-04 NOTE — Progress Notes (Signed)
PHARMACIST - PHYSICIAN COMMUNICATION CONCERNING:  IV Heparin  12 yoM with hx of PE and metastatic prostate cancer presents with bilateral large acute PE with right heart strain.  Currently on IV heparin at 1400 units/hr.  First heparin level is therapeutic.  No issues with infusion and no bleeding noted per RN.     RECOMMENDATION: Continue heparin at 1400 units/hr.  Recheck confirmation level in 6 hours.   Ralene Bathe, PharmD, BCPS 07/04/2015, 7:29 PM  Pager: [phone]

## 2015-07-04 NOTE — ED Notes (Signed)
Nurse drawing addl labs 

## 2015-07-04 NOTE — ED Notes (Signed)
MD at bedside.  [name] Leech MD

## 2015-07-04 NOTE — Consult Note (Signed)
PULMONARY / CRITICAL CARE MEDICINE   Name: [name]. MRN: [id number] DOB: [birth year]    ADMISSION DATE:  07/04/2015 CONSULTATION DATE:  07/04/2015  REFERRING MD :  Regenia Skeeter, EDP  CHIEF COMPLAINT:  Shortness of breath  INITIAL PRESENTATION: 69 year old male with prostate cancer was admitted on 07/04/2015 with a submassive pulmonary embolism. Has a history of pulmonary embolism several years ago associated with knee replacement surgery of the right leg. Is currently receiving treatment for prostate cancer.  STUDIES:  07/04/2015 CT chest> acute bilateral pulmonary emboli in main pulmonary arteries but not a saddle embolus, RV size increased, atelectasis right base, trace effusion right base  SIGNIFICANT EVENTS:    HISTORY OF PRESENT ILLNESS:  This is a pleasant 69 year old male who is currently receiving treatment for prostate cancer who was admitted to [facility] on 07/04/2015 after complaining of one week of shortness of breath. He stated that he had just been having increasing dyspnea for proximally one week prior to admission. He notes that previously he's had a DVT after a total knee replacement of the right knee several years ago. He was treated for a brief period of time with blood thinners in the never had a blood clot after that. He says that that one did go to his lungs. He notes that he's been treated for prostate cancer recently as guided by our local cancer Center. For the last week he said increasing shortness of breath, no chest pain, no cough, no hemoptysis. He did not think he been having leg swelling.  PAST MEDICAL HISTORY :   has a past medical history of Hypertension; Osteoarthritis; Retroperitoneal lymphadenopathy; History of pulmonary embolus (PE); History of DVT (deep vein thrombosis); OSA on CPAP; History of acute renal failure; Urgency of urination; ED (erectile dysfunction); Left renal mass; Hydronephrosis, right; Prostate cancer (ONCOLOGIST-  DR  Alen Blew); Fatigue; Chemotherapy-induced nausea; and Metastasis to bone.  has past surgical history that includes Total knee arthroplasty (Bilateral, RIGHT  04-24-2009/   LEFT  2005); INSERTION IVC FILTER AND REMOVAL (03-25-2009/   REMOVAL 05-29-2009); Prostatectomy (MAY 2001); transthoracic echocardiogram (12-27-2012); Inguinal hernia repair (Right, 2010); Penile prosthesis implant (2005); Cystoscopy w/ ureteral stent placement (Right, 10/22/2013); Portacath placement (02-06-2014); Colonoscopy w/ polypectomy (05-23-2013); Cystoscopy w/ ureteral stent placement (Right, 06/24/2014); and Cystoscopy w/ ureteral stent placement (Right, 11/22/2014). Prior to Admission medications   Medication Sig Start Date End Date Taking? Authorizing Provider  aspirin EC 81 MG tablet Take 162 mg by mouth every evening.    Yes Historical Provider, MD  calcium carbonate (OS-CAL) 1250 MG chewable tablet Chew 1 tablet by mouth 2 (two) times daily.    Yes Historical Provider, MD  leuprolide (LUPRON) 30 MG injection Inject 30 mg into the muscle every 4 (four) months. LAST INJECTION JAN 2016   Yes Historical Provider, MD  lidocaine-prilocaine (EMLA) cream Apply topically as needed. Patient taking differently: Apply 1 application topically as needed (for port).  06/05/14  Yes [name], MD  loratadine (CLARITIN) 10 MG tablet Take 10 mg by mouth daily.   Yes Historical Provider, MD  losartan-hydrochlorothiazide (HYZAAR) 50-12.5 MG per tablet Take 1 tablet by mouth every evening.  10/21/14  Yes Historical Provider, MD  olaparib (LYNPARZA) 50 MG capsule Take 400 mg by mouth 2 (two) times daily. Swallow whole.   Yes Historical Provider, MD  ondansetron (ZOFRAN) 8 MG tablet Take 1 tablet (8 mg total) by mouth every 8 (eight) hours as needed for nausea or vomiting. 04/24/15  Yes Carlton Adam, PA-C  oxyCODONE (OXY IR/ROXICODONE) 5 MG immediate release tablet Take 1 tablet (5 mg total) by mouth every 4 (four) hours as needed for severe  pain. 07/02/15  Yes [name], MD  mirabegron ER (MYRBETRIQ) 50 MG TB24 tablet Take 50 mg by mouth daily. 11/22/14   Historical Provider, MD   No Known Allergies  FAMILY HISTORY:  indicated that his mother is deceased. He indicated that his father is deceased.  SOCIAL HISTORY:  reports that he has never smoked. He has never used smokeless tobacco. He reports that he drinks about 3.6 oz of alcohol per week. He reports that he does not use illicit drugs.  REVIEW OF SYSTEMS:   Gen: Denies fever, chills, weight change, fatigue, night sweats HEENT: Denies blurred vision, double vision, hearing loss, tinnitus, sinus congestion, rhinorrhea, sore throat, neck stiffness, dysphagia PULM: Per history of present illness CV: Denies chest pain, edema, orthopnea, paroxysmal nocturnal dyspnea, palpitations GI: Denies abdominal pain, nausea, vomiting, diarrhea, hematochezia, melena, constipation, change in bowel habits GU: Denies dysuria, hematuria, polyuria, oliguria, urethral discharge Endocrine: Denies hot or cold intolerance, polyuria, polyphagia or appetite change Derm: Denies rash, dry skin, scaling or peeling skin change Heme: Denies easy bruising, bleeding, bleeding gums Neuro: Denies headache, numbness, weakness, slurred speech, loss of memory or consciousness   SUBJECTIVE:   VITAL SIGNS: Temp:  [97.6 F (36.4 C)] 97.6 F (36.4 C) (09/23 1120) Pulse Rate:  [91-98] 91 (09/23 1120) Resp:  [12-29] 20 (09/23 1120) BP: (90-97)/(63-66) 97/66 mmHg (09/23 1120) SpO2:  [87 %-93 %] 93 % (09/23 1120) Weight:  [190 lb (86.183 kg)] 190 lb (86.183 kg) (09/23 1242) HEMODYNAMICS:   VENTILATOR SETTINGS:   INTAKE / OUTPUT: No intake or output data in the 24 hours ending 07/04/15 1254  PHYSICAL EXAMINATION: General:  Comfortable in bed Neuro:  Awake alert, oriented 4 HEENT:  Normocephalic atraumatic, oropharynx clear, EOMI Cardiovascular:  Normal rate and rhythm, no murmurs gallops or  rubs Lungs:  Clear to auscultation bilaterally Abdomen:  Bowel sounds positive, nontender nondistended Musculoskeletal:  Normal bulk and tone Skin:  Left leg swollen with pitting edema, right total knee replacement scar noted  LABS:  CBC  Recent Labs Lab 07/01/15 0904 07/04/15 1100 07/04/15 1103  WBC 4.8 5.9  --   HGB 9.1* 10.0* 10.9*  HCT 28.1* 29.8* 32.0*  PLT 145 200  --    Coag's  Recent Labs Lab 07/04/15 1100  INR 1.10   BMET  Recent Labs Lab 07/01/15 0905 07/04/15 1100 07/04/15 1103  NA 143 140 141  K 3.6 3.7 3.7  CL  --  107 105  CO2 25 24  --   BUN 15.2 20 22*  CREATININE 1.3 1.44* 1.30*  GLUCOSE 129 137* 136*   Electrolytes  Recent Labs Lab 07/01/15 0905 07/04/15 1100  CALCIUM 8.8 8.8*   Sepsis Markers No results for input(s): LATICACIDVEN, PROCALCITON, O2SATVEN in the last 168 hours. ABG No results for input(s): PHART, PCO2ART, PO2ART in the last 168 hours. Liver Enzymes  Recent Labs Lab 07/01/15 0905 07/04/15 1100  AST 14 32  ALT 10 22  ALKPHOS 38* 44  BILITOT 0.44 0.3  ALBUMIN 3.5 3.8   Cardiac Enzymes No results for input(s): TROPONINI, PROBNP in the last 168 hours. Glucose No results for input(s): GLUCAP in the last 168 hours.  Imaging Ct Angio Chest Pe W/cm &/or Wo Cm  07/04/2015   CLINICAL DATA:  Shortness of breath.  EXAM: CT  ANGIOGRAPHY CHEST WITH CONTRAST  TECHNIQUE: Multidetector CT imaging of the chest was performed using the standard protocol during bolus administration of intravenous contrast. Multiplanar CT image reconstructions and MIPs were obtained to evaluate the vascular anatomy.  CONTRAST:  3mL OMNIPAQUE IOHEXOL 350 MG/ML SOLN  COMPARISON:  CT scan of January 22, 2014.  FINDINGS: No pneumothorax is noted. Minimal bilateral pleural effusions are noted with adjacent subsegmental atelectasis. 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 There is no evidence of thoracic aortic dissection or aneurysm. Large bilateral pulmonary  emboli are noted. RV/LV ratio greater than 2 is noted suggesting right heart strain. Right internal jugular Port-A-Cath is noted with distal tip at expected position of the cavoatrial junction. No significant osseous abnormality is noted.  Within visualized portion of the abdomen, enlarged retroperitoneal adenopathy is noted concerning for metastatic disease. Largest lymph node visualized measures 4.1 x 3.9 cm.  Review of the MIP images confirms the above findings.  IMPRESSION: Enlarged retroperitoneal adenopathy is noted within the visualized portion of the abdomen consistent with metastatic disease.  Positive for bilateral large acute PE with CT evidence of right heart strain (RV/LV Ratio = 2) consistent with at least submassive (intermediate risk) PE. The presence of right heart strain has been associated with an increased risk of morbidity and mortality. Please activate Code PE by paging [phone]. Critical Value/emergent results were called by telephone at the time of interpretation on 07/04/2015 at 12:35 pm to Dr. Sherwood Gambler , who verbally acknowledged these results.   Electronically Signed   By: Marijo Conception, [name]   On: 07/04/2015 12:35     ASSESSMENT / PLAN:  PULMONARY A: Acute submassive pulmonary embolism. sPESI score 2 (cancer, hypoxemia) and currently hemodynamically stable. ProBNP is mildly elevated and troponin is normal. He is at increased risk of death from a pulmonary embolism but there is no clear evidence to suggest that thrombolytic therapy will alter that at this time or that the risk of thrombolytics are greater than just treating with anticoagulation alone. 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  May also want oncology to comment as to whether or not Lovenox would be a better option considering his malignancy.   I discussed this with his wife at length at bedside   Roselie Awkward, MD [facility] Pager: [phone] Cell: [phone] After 3pm or if no response, call [phone]   07/04/2015, 12:54 PM

## 2015-07-05 DIAGNOSIS — N182 Chronic kidney disease, stage 2 (mild): Secondary | ICD-10-CM

## 2015-07-05 DIAGNOSIS — C61 Malignant neoplasm of prostate: Secondary | ICD-10-CM

## 2015-07-05 DIAGNOSIS — C7951 Secondary malignant neoplasm of bone: Secondary | ICD-10-CM

## 2015-07-05 DIAGNOSIS — D638 Anemia in other chronic diseases classified elsewhere: Secondary | ICD-10-CM

## 2015-07-05 DIAGNOSIS — I2699 Other pulmonary embolism without acute cor pulmonale: Principal | ICD-10-CM

## 2015-07-05 LAB — COMPREHENSIVE METABOLIC PANEL
ALK PHOS: 38 U/L (ref 38–126)
ALT: 21 U/L (ref 17–63)
AST: 22 U/L (ref 15–41)
Albumin: 3.4 g/dL — ABNORMAL LOW (ref 3.5–5.0)
Anion gap: 8 (ref 5–15)
BILIRUBIN TOTAL: 0.4 mg/dL (ref 0.3–1.2)
BUN: 19 mg/dL (ref 6–20)
CALCIUM: 8.1 mg/dL — AB (ref 8.9–10.3)
CHLORIDE: 110 mmol/L (ref 101–111)
CO2: 24 mmol/L (ref 22–32)
CREATININE: 1.4 mg/dL — AB (ref 0.61–1.24)
GFR, EST AFRICAN AMERICAN: 58 mL/min — AB (ref >60)
GFR, EST NON AFRICAN AMERICAN: 50 mL/min — AB (ref >60)
Glucose, Bld: 113 mg/dL — ABNORMAL HIGH (ref 65–99)
Potassium: 4 mmol/L (ref 3.5–5.1)
Sodium: 142 mmol/L (ref 135–145)
Total Protein: 6.3 g/dL — ABNORMAL LOW (ref 6.5–8.1)

## 2015-07-05 LAB — CBC
HEMATOCRIT: 28.2 % — AB (ref 39.0–52.0)
HEMOGLOBIN: 9.3 g/dL — AB (ref 13.0–17.0)
MCH: 27.5 pg (ref 26.0–34.0)
MCHC: 33 g/dL (ref 30.0–36.0)
MCV: 83.4 fL (ref 78.0–100.0)
Platelets: 176 10*3/uL (ref 150–400)
RBC: 3.38 MIL/uL — AB (ref 4.22–5.81)
RDW: 23.6 % — ABNORMAL HIGH (ref 11.5–15.5)
WBC: 5.3 10*3/uL (ref 4.0–10.5)

## 2015-07-05 LAB — HEPARIN LEVEL (UNFRACTIONATED)
HEPARIN UNFRACTIONATED: 0.53 [IU]/mL (ref 0.30–0.70)
Heparin Unfractionated: 0.51 IU/mL (ref 0.30–0.70)

## 2015-07-05 LAB — TROPONIN I: Troponin I: 0.06 ng/mL — ABNORMAL HIGH (ref <0.031)

## 2015-07-05 MED ORDER — OXYCODONE HCL 5 MG PO TABS
5.0000 mg | ORAL_TABLET | ORAL | Status: DC | PRN
  Administered 2015-07-05 – 2015-07-08 (×3): 5 mg via ORAL
  Filled 2015-07-05 (×3): qty 1

## 2015-07-05 MED ORDER — SODIUM CHLORIDE 0.9 % IV SOLN: INTRAVENOUS | Status: DC

## 2015-07-05 MED ORDER — OLAPARIB 50 MG PO CAPS
400.0000 mg | ORAL_CAPSULE | Freq: Two times a day (BID) | ORAL | Status: DC
  Administered 2015-07-05 – 2015-07-08 (×6): 400 mg via ORAL

## 2015-07-05 MED ORDER — MIRABEGRON ER 50 MG PO TB24
50.0000 mg | ORAL_TABLET | Freq: Every day | ORAL | Status: DC
  Administered 2015-07-05 – 2015-07-08 (×4): 50 mg via ORAL
  Filled 2015-07-05 (×5): qty 1

## 2015-07-05 MED ORDER — OXYCODONE HCL 5 MG PO TABS: 5.0000 mg | ORAL_TABLET | ORAL | Status: DC | PRN

## 2015-07-05 MED ORDER — LIDOCAINE-PRILOCAINE 2.5-2.5 % EX CREA
1.0000 "application " | TOPICAL_CREAM | CUTANEOUS | Status: DC | PRN
  Filled 2015-07-05: qty 5

## 2015-07-05 MED ORDER — LORATADINE 10 MG PO TABS
10.0000 mg | ORAL_TABLET | Freq: Every day | ORAL | Status: DC
  Administered 2015-07-05 – 2015-07-08 (×4): 10 mg via ORAL
  Filled 2015-07-05 (×4): qty 1

## 2015-07-05 MED ORDER — OLAPARIB 50 MG PO CAPS
400.0000 mg | ORAL_CAPSULE | Freq: Two times a day (BID) | ORAL | Status: DC
  Administered 2015-07-05: 400 mg via ORAL

## 2015-07-05 MED ORDER — CALCIUM CARBONATE 1250 (500 CA) MG PO TABS
1250.0000 mg | ORAL_TABLET | Freq: Two times a day (BID) | ORAL | Status: DC
  Administered 2015-07-05 – 2015-07-08 (×7): 1250 mg via ORAL
  Filled 2015-07-05 (×7): qty 3

## 2015-07-05 NOTE — Progress Notes (Signed)
ANTICOAGULATION CONSULT NOTE - Follow Up Consult  Pharmacy Consult for heparin Indication: new  pulmonary embolus  No Known Allergies  Patient Measurements: Height: 5\' 8"  (172.7 cm) Weight: 202 lb 6.1 oz (91.8 kg) IBW/kg (Calculated) : 68.4 Heparin Dosing Weight: 83 kg  Vital Signs: Temp: 97.2 F (36.2 C) (09/23 2000) Temp Source: Axillary (09/23 2000) BP: 100/77 mmHg (09/23 2200) Pulse Rate: 88 (09/23 2200)  Labs:  Recent Labs  07/04/15 1100 07/04/15 1103 07/04/15 1337 07/04/15 1903 07/05/15 0135  HGB 10.0* 10.9*  --   --  9.3*  HCT 29.8* 32.0*  --   --  28.2*  PLT 200  --   --   --  176  LABPROT 14.4  --   --   --   --   INR 1.10  --   --   --   --   HEPARINUNFRC  --   --   --  0.58 0.53  CREATININE 1.44* 1.30*  --   --  1.40*  TROPONINI  --   --  0.08* 0.08* 0.06*    Estimated Creatinine Clearance: 54.8 mL/min (by C-G formula based on Cr of 1.4).   Medical History: Past Medical History  Diagnosis Date  . Hypertension   . Osteoarthritis   . Retroperitoneal lymphadenopathy   . History of pulmonary embolus (PE)     dec 2009  . History of DVT (deep vein thrombosis)     dec 2009  . OSA on CPAP   . History of acute renal failure     W/ SEPSIS  MARCH 2014  . Urgency of urination   . ED (erectile dysfunction)   . Left renal mass     MONITORED BY UROLOGIST-  DR Diona Fanti  . Hydronephrosis, right   . Prostate cancer ONCOLOGIST-  DR Alen Blew    DX 2001 (T3 N1)-- S/P PROSTATECTOMY--/ BIOCHEMICAL RELAPSE, RECEIVED PROSTATIC BED RADIATION AND LUPRON WITH CASODEX   SINCE DEVELOPED  CASTATION RESISITENT METASTATIC PROSTATE ADENOCARINOMA--  CURRENT THERAPY CHEMOTHEAPY EVERY 3 WEEKS AND LUPRON INJECTIONS EVERY 4 MONTHS  . Fatigue   . Chemotherapy-induced nausea   . Metastasis to bone     PRIMARY PROSTATE CANCER    Assessment: Patient's a 69 y.o M with hx PE and metastatic prostate cancer on lunparza and lupron PTA.  He presented to the ED on 9/23 with c/o SOB.   Chest CTA showed bilateral large acute PE with right heart strain.  To start heparin for new PE.  Repeat heparin level = 0.53 with heparin infusing @ 1400 units/hr (heparin level therapeutic x 2 )  No complications of therapy noted   Goal of Therapy:  Heparin level 0.3-0.7 units/ml Monitor platelets by anticoagulation protocol: Yes   Plan:  - Continue IV heparin @ 1400 units/hr - Follow heparin level daily (beginning 9/25) & daily CBC   Quantez Schnyder, Toribio Harbour, PharmD 07/05/2015,2:23 AM

## 2015-07-05 NOTE — Progress Notes (Addendum)
ANTICOAGULATION CONSULT NOTE - Follow Up Consult  Pharmacy Consult for heparin Indication: new  pulmonary embolus  No Known Allergies  Patient Measurements: Height: 5\' 8"  (172.7 cm) Weight: 206 lb 5.6 oz (93.6 kg) IBW/kg (Calculated) : 68.4 Heparin Dosing Weight: 83 kg  Vital Signs: Temp: 97.6 F (36.4 C) (09/24 0800) Temp Source: Oral (09/24 0800) BP: 104/73 mmHg (09/24 0600) Pulse Rate: 87 (09/24 0600)  Labs:  Recent Labs  07/04/15 1100 07/04/15 1103 07/04/15 1337 07/04/15 1903 07/05/15 0135  HGB 10.0* 10.9*  --   --  9.3*  HCT 29.8* 32.0*  --   --  28.2*  PLT 200  --   --   --  176  LABPROT 14.4  --   --   --   --   INR 1.10  --   --   --   --   HEPARINUNFRC  --   --   --  0.58 0.53  CREATININE 1.44* 1.30*  --   --  1.40*  TROPONINI  --   --  0.08* 0.08* 0.06*   Estimated Creatinine Clearance: 55.3 mL/min (by C-G formula based on Cr of 1.4).  Medical History: Past Medical History  Diagnosis Date  . Hypertension   . Osteoarthritis   . Retroperitoneal lymphadenopathy   . History of pulmonary embolus (PE)     dec 2009  . History of DVT (deep vein thrombosis)     dec 2009  . OSA on CPAP   . History of acute renal failure     W/ SEPSIS  MARCH 2014  . Urgency of urination   . ED (erectile dysfunction)   . Left renal mass     MONITORED BY UROLOGIST-  DR Diona Fanti  . Hydronephrosis, right   . Prostate cancer ONCOLOGIST-  DR Alen Blew    DX 2001 (T3 N1)-- S/P PROSTATECTOMY--/ BIOCHEMICAL RELAPSE, RECEIVED PROSTATIC BED RADIATION AND LUPRON WITH CASODEX   SINCE DEVELOPED  CASTATION RESISITENT METASTATIC PROSTATE ADENOCARINOMA--  CURRENT THERAPY CHEMOTHEAPY EVERY 3 WEEKS AND LUPRON INJECTIONS EVERY 4 MONTHS  . Fatigue   . Chemotherapy-induced nausea   . Metastasis to bone     PRIMARY PROSTATE CANCER   Assessment: Patient's a 69 y.o M with hx LE VTE 2009 after orthopedic procedure (patient does not recall oral anti-coagulant) and metastatic prostate cancer on  Lynparza and lupron PTA.  He presented to the ED on 9/23 with c/o SOB.  Chest CTA showed bilateral large acute PE with right heart strain.  To start heparin for new PE.  Repeat heparin level = 0.53 with heparin infusing @ 1400 units/hr (heparin level therapeutic x 2 )  No complications of therapy noted  Holding Lynparza, can incr risk of PE, pneumonitis   Goal of Therapy:  Heparin level 0.3-0.7 units/ml Monitor platelets by anticoagulation protocol: Yes   Plan:  - Continue IV heparin @ 1400 units/hr - Follow heparin level daily (beginning 9/25) & daily CBC - Will recheck Hep level this afternoon, received large bolus (5000 units) - Onc consult for recommendation of anti-coagulant: Lovenox vs NOAC vs Warfarin  Minda Ditto PharmD Pager [phone] 07/05/2015, 11:43 AM   Addendum:  Follow up Heparin level at 1600 = 0.51 units/ml on 1400 units/hr, in therapeutic range  Continue Heparin at 1400 units/hr  Oncology recommending Lovenox for chronic anti-coagulation, begin tomorrow if patient remains hemodynamically stable  Minda Ditto PharmD Pager [phone] 07/05/2015, 4:58 PM

## 2015-07-05 NOTE — Progress Notes (Signed)
Pharmacy - Physician communication  Assessment:  [phone] with prostate cancer and new PE ordered olaparib [place]) to continue while inpatient.  No P&T-approved hold criteria; however, VTE and pneumonitis are known side effects.  Discussed with [place] attending.  Plan: Will hold olaparib for now pending oncology input.  Reuel Boom, PharmD, BCPS Pager: [phone] 07/05/2015, 10:49 AM

## 2015-07-05 NOTE — Progress Notes (Signed)
Pt placed on Auto CPAP 6-18 CMH20 with 6 LPM O2 bleed in via FFM.  Pt tolerating well at this time, RT to monitor and assess as needed.

## 2015-07-05 NOTE — Progress Notes (Signed)
PULMONARY / CRITICAL CARE MEDICINE   Name: [name]. MRN: [id number] DOB: [birth year]    ADMISSION DATE:  07/04/2015 CONSULTATION DATE:  07/04/2015  REFERRING MD :  [name], EDP  CHIEF COMPLAINT:  Shortness of breath  INITIAL PRESENTATION: 69 year old male with prostate cancer was admitted on 07/04/2015 with a submassive pulmonary embolism. Has a history of pulmonary embolism several years ago associated with knee replacement surgery of the right leg. Is currently receiving treatment for prostate cancer.  STUDIES:  07/04/2015 CT chest> acute bilateral pulmonary emboli in main pulmonary arteries but not a saddle embolus, RV size increased, atelectasis right base, trace effusion right base 07/04/15 LE dopplers> Lt DVT 9/23 Echo > - Severely dilated RV with moderately decreased systolic function and McConnell sign consistent with pulmonary embolism. Moderate to severe pulmonary hypertension.  SIGNIFICANT EVENTS:  HISTORY OF PRESENT ILLNESS:  This is a pleasant 69 year old male who is currently receiving treatment for prostate cancer who was admitted to [facility] long hospital on 07/04/2015 after complaining of one week of shortness of breath. He stated that he had just been having increasing dyspnea for proximally one week prior to admission. He notes that previously he's had a DVT after a total knee replacement of the right knee several years ago. He was treated for a brief period of time with blood thinners in the never had a blood clot after that. He says that that one did go to his lungs. He notes that he's been treated for prostate cancer recently as guided by our local cancer Center. For the last week he said increasing shortness of breath, no chest pain, no cough, no hemoptysis. He did not think he been having leg swelling.  PAST MEDICAL HISTORY :   has a past medical history of Hypertension; Osteoarthritis; Retroperitoneal lymphadenopathy; History of pulmonary embolus (PE);  History of DVT (deep vein thrombosis); OSA on CPAP; History of acute renal failure; Urgency of urination; ED (erectile dysfunction); Left renal mass; Hydronephrosis, right; Prostate cancer (ONCOLOGIST-  DR [name]); Fatigue; Chemotherapy-induced nausea; and Metastasis to bone.  has past surgical history that includes Total knee arthroplasty (Bilateral, RIGHT  04-24-2009/   LEFT  2005); INSERTION IVC FILTER AND REMOVAL (03-25-2009/   REMOVAL 05-29-2009); Prostatectomy (MAY 2001); transthoracic echocardiogram (12-27-2012); Inguinal hernia repair (Right, 2010); Penile prosthesis implant (2005); Cystoscopy w/ ureteral stent placement (Right, 10/22/2013); Portacath placement (02-06-2014); Colonoscopy w/ polypectomy (05-23-2013); Cystoscopy w/ ureteral stent placement (Right, 06/24/2014); and Cystoscopy w/ ureteral stent placement (Right, 11/22/2014). Prior to Admission medications   Medication Sig Start Date End Date Taking? Authorizing Provider  aspirin EC 81 MG tablet Take 162 mg by mouth every evening.    Yes Historical Provider, MD  calcium carbonate (OS-CAL) 1250 MG chewable tablet Chew 1 tablet by mouth 2 (two) times daily.    Yes Historical Provider, MD  leuprolide (LUPRON) 30 MG injection Inject 30 mg into the muscle every 4 (four) months. LAST INJECTION JAN 2016   Yes Historical Provider, MD  lidocaine-prilocaine (EMLA) cream Apply topically as needed. Patient taking differently: Apply 1 application topically as needed (for port).  06/05/14  Yes [name] Portela, MD  loratadine (CLARITIN) 10 MG tablet Take 10 mg by mouth daily.   Yes Historical Provider, MD  losartan-hydrochlorothiazide (HYZAAR) 50-12.5 MG per tablet Take 1 tablet by mouth every evening.  10/21/14  Yes Historical Provider, MD  olaparib (LYNPARZA) 50 MG capsule Take 400 mg by mouth 2 (two) times daily. Swallow whole.   Yes Historical Provider,  MD  ondansetron (ZOFRAN) 8 MG tablet Take 1 tablet (8 mg total) by mouth every 8 (eight) hours as  needed for nausea or vomiting. 04/24/15  Yes [name] E Johnson, PA-C  oxyCODONE (OXY IR/ROXICODONE) 5 MG immediate release tablet Take 1 tablet (5 mg total) by mouth every 4 (four) hours as needed for severe pain. 07/02/15  Yes [name] Portela, MD  mirabegron ER (MYRBETRIQ) 50 MG TB24 tablet Take 50 mg by mouth daily. 11/22/14   Historical Provider, MD   No Known Allergies  FAMILY HISTORY:  indicated that his mother is deceased. He indicated that his father is deceased.  SOCIAL HISTORY:  reports that he has never smoked. He has never used smokeless tobacco. He reports that he drinks about 3.6 oz of alcohol per week. He reports that he does not use illicit drugs.  SUBJECTIVE:   VITAL SIGNS: Temp:  [97.2 F (36.2 C)-98.6 F (37 C)] 97.6 F (36.4 C) (09/24 0800) Pulse Rate:  [85-98] 87 (09/24 0600) Resp:  [7-32] 24 (09/24 0600) BP: (90-112)/(63-78) 104/73 mmHg (09/24 0600) SpO2:  [82 %-100 %] 95 % (09/24 0600) Weight:  [190 lb (86.183 kg)-206 lb 5.6 oz (93.6 kg)] 206 lb 5.6 oz (93.6 kg) (09/24 0400) HEMODYNAMICS:   VENTILATOR SETTINGS:   INTAKE / OUTPUT:  Intake/Output Summary (Last 24 hours) at 07/05/15 1011 Last data filed at 07/05/15 0952  Gross per 24 hour  Intake   1765 ml  Output    175 ml  Net   1590 ml    PHYSICAL EXAMINATION: General:  Awake, No distress Neuro:  No focal deficits HEENT:  PERRLA, EMOI Cardiovascular:  RRR, No MRG Lungs:  Clear to auscultation bilaterally Abdomen:  Bowel sounds positive, nontender nondistended Musculoskeletal:  Normal bulk and tone Skin:  Lt edema.  LABS:  CBC  Recent Labs Lab 07/01/15 0904 07/04/15 1100 07/04/15 1103 07/05/15 0135  WBC 4.8 5.9  --  5.3  HGB 9.1* 10.0* 10.9* 9.3*  HCT 28.1* 29.8* 32.0* 28.2*  PLT 145 200  --  176   Coag's  Recent Labs Lab 07/04/15 1100  INR 1.10   BMET  Recent Labs Lab 07/01/15 0905 07/04/15 1100 07/04/15 1103 07/05/15 0135  NA 143 140 141 142  K 3.6 3.7 3.7 4.0  CL  --   107 105 110  CO2 25 24  --  24  BUN 15.2 20 22* 19  CREATININE 1.3 1.44* 1.30* 1.40*  GLUCOSE 129 137* 136* 113*   Electrolytes  Recent Labs Lab 07/01/15 0905 07/04/15 1100 07/04/15 1337 07/05/15 0135  CALCIUM 8.8 8.8*  --  8.1*  MG  --   --  1.9  --    Sepsis Markers No results for input(s): LATICACIDVEN, PROCALCITON, O2SATVEN in the last 168 hours. ABG No results for input(s): PHART, PCO2ART, PO2ART in the last 168 hours. Liver Enzymes  Recent Labs Lab 07/04/15 1100 07/04/15 1337 07/05/15 0135  AST 32 25 22  ALT 22 21 21   ALKPHOS 44 41 38  BILITOT 0.3 0.2* 0.4  ALBUMIN 3.8 3.7 3.4*   Cardiac Enzymes  Recent Labs Lab 07/04/15 1337 07/04/15 1903 07/05/15 0135  TROPONINI 0.08* 0.08* 0.06*   Glucose No results for input(s): GLUCAP in the last 168 hours.  Imaging Ct Angio Chest Pe W/cm &/or Wo Cm  07/04/2015   CLINICAL DATA:  Shortness of breath.  EXAM: CT ANGIOGRAPHY CHEST WITH CONTRAST  TECHNIQUE: Multidetector CT imaging of the chest was performed using the standard  protocol during bolus administration of intravenous contrast. Multiplanar CT image reconstructions and MIPs were obtained to evaluate the vascular anatomy.  CONTRAST:  64mL OMNIPAQUE IOHEXOL 350 MG/ML SOLN  COMPARISON:  CT scan of January 22, 2014.  FINDINGS: No pneumothorax is noted. Minimal bilateral pleural effusions are noted with adjacent subsegmental atelectasis. 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 There is no evidence of thoracic aortic dissection or aneurysm. Large bilateral pulmonary emboli are noted. RV/LV ratio greater than 2 is noted suggesting right heart strain. Right internal jugular Port-A-Cath is noted with distal tip at expected position of the cavoatrial junction. No significant osseous abnormality is noted.  Within visualized portion of the abdomen, enlarged retroperitoneal adenopathy is noted concerning for metastatic disease. Largest lymph node visualized measures 4.1 x 3.9 cm.  Review of  the MIP images confirms the above findings.  IMPRESSION: Enlarged retroperitoneal adenopathy is noted within the visualized portion of the abdomen consistent with metastatic disease.  Positive for bilateral large acute PE with CT evidence of right heart strain (RV/LV Ratio = 2) consistent with at least submassive (intermediate risk) PE. The presence of right heart strain has been associated with an increased risk of morbidity and mortality. Please activate Code PE by paging [phone]. Critical Value/emergent results were called by telephone at the time of interpretation on 07/04/2015 at 12:35 pm to Dr. Sherwood Gambler , who verbally acknowledged these results.   Electronically Signed   By: Marijo Conception, [name]   On: 07/04/2015 12:35     ASSESSMENT / PLAN:  PULMONARY A: Acute submassive pulmonary embolism. sPESI score 2 (cancer, hypoxemia)  RV failure with McConnell sign. 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 If there is any decompensations we can consider lytics.   Marshell Garfinkel MD [facility] Pager [phone] If no answer or after 3pm call: [phone] 07/05/2015, 10:11 AM

## 2015-07-05 NOTE — Progress Notes (Addendum)
PROGRESS NOTE    [name]. PNT:[id number] DOB: 25-Dec-1945 DOA: 07/04/2015 PCP: Simona Huh, MD  Oncology: Dr. Roxy Cedar Shadad/Dr. 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 He has been on this medication and tolerated it well. His PSA is slightly higher up to 1519. Despite the slight rise in his PSA, and has overall stabilized since July 2016, Status post Port-A-Cath, hydronephrosis status post stent, on Lupron treatment, left renal mass likely renal cell carcinoma, OSA on nightly CPAP, seen by his oncologist on 9/23 prior history of DVT/PE in 2009, presented to the [facility] ED on 07/04/15 with 1 week history of dyspnea on exertion. CTA chest showed submassive acute PE with right ventricular strain. Admitted to stepdown unit. CCM consulted-no thrombolytics indicated. Started on IV heparin. Oncology consulted regarding choice of long-term anticoagulation-Lovenox versus NOAC Vs Coumadin.   Assessment/Plan:  Acute submassive pulmonary embolism with right heart strain - Patient has prior history of PE/DVT in 2009 following orthopedic surgery. He cannot recollect which anticoagulant he was on at that time - Hemodynamically stable on admission. Admitted to stepdown unit. CCM consulted-not a candidate for thrombolytic treatment. Started on IV heparin drip. - 2-D echo results as below. - Consulted oncology to recommend choice of maintenance anticoagulants (Lovenox versus NOAC Vs Coumadin).  - will need lifelong anticoagulation unless he develops contraindications. - Continue IV heparin for additional 24 hours.  Essential hypertension - Tightly controlled blood pressures off of antihypertensives. Continue to monitor without meds for  now  Chronic anemia - Stable. Follow CBCs while on IV heparin.  Stage II chronic kidney disease - Baseline creatinine 1.4. Stable  Elevated troponin - Secondary to demand ischemia from acute submassive PE. Flat trend. 2-D echo with normal EF and no wall motion abnormalities. No further workup. No chest pain at this time.  Castrate resistant metastatic prostate cancer - Continue Lynparza- olaparib  - Outpatient follow-up with oncology.  Chronic bilateral lower extremity edema - Reduced IV fluids  OSA - Continue nightly CPAP   DVT prophylaxis: On IV heparin anticoagulation Code Status:  Full Family Communication:  None at bedside. Left message for spouse on home and cell phone.  Disposition Plan:  Continue management in stepdown unit for additional 24 hours. DC home when medically stable, possibly in 2-3 days.   Consultants:   CCM  medical oncology  Procedures:   2-D echo 07/04/15: Study Conclusions  - Left ventricle: The cavity size was normal. There was mild concentric hypertrophy. Systolic function was vigorous. The estimated ejection fraction was in the range of 65% to 70%. Wall motion was normal; there were no regional wall motion abnormalities. Doppler parameters are consistent with abnormal left ventricular relaxation (grade 1 diastolic dysfunction). There was no evidence of elevated ventricular filling pressure by Doppler parameters. - Ventricular septum: The contour showed diastolic flattening and systolic flattening. - Aortic valve: Trileaflet; normal thickness leaflets. There was trivial regurgitation. - Aortic root: The aortic root was normal in size. - Mitral valve: Structurally normal valve. There was no regurgitation. - Left atrium: The atrium was normal in size. - Right ventricle: The cavity size was severely dilated. Wall thickness was normal. Systolic function was moderately reduced. - Right atrium: The atrium was mildly  dilated. - Tricuspid valve: There was moderate regurgitation. - Pulmonic valve: Structurally normal valve.  There was mild regurgitation. - Pulmonary arteries: Systolic pressure was moderately to severely increased. PA peak pressure: 53 mm Hg (S). - Inferior vena cava: The vessel was dilated. The respirophasic diameter changes were blunted (< 50%), consistent with elevated central venous pressure.  Impressions:  - Severely dilated RV with moderately decreased systolic function and McConnell sign consistent with pulmonary embolism. Moderate to severe pulmonary hypertension.   Antibiotics:   None   Subjective:  Feels better. Dyspnea improved. No chest pain reported.  Objective: Filed Vitals:   07/05/15 0200 07/05/15 0400 07/05/15 0600 07/05/15 0800  BP: 112/78 100/69 104/73   Pulse: 86 85 87   Temp:  98.6 F (37 C)  97.6 F (36.4 C)  TempSrc:  Axillary  Oral  Resp: 20 22 24    Height:      Weight:  93.6 kg (206 lb 5.6 oz)    SpO2: 100% 94% 95%     Intake/Output Summary (Last 24 hours) at 07/05/15 1024 Last data filed at 07/05/15 1007  Gross per 24 hour  Intake   1765 ml  Output    175 ml  Net   1590 ml   Filed Weights   07/04/15 1242 07/04/15 1700 07/05/15 0400  Weight: 86.183 kg (190 lb) 91.8 kg (202 lb 6.1 oz) 93.6 kg (206 lb 5.6 oz)     Exam:  General exam:  Pleasant middle-aged male lying comfortably propped up in bed Respiratory system: diminished breath sounds in the bases otherwise clear to auscultation. No increased work of breathing. Cardiovascular system: S1 & S2 heard, RRR. No JVD, murmurs, gallops. Trace bilateral leg edema. Telemetry: Sinus rhythm Gastrointestinal system: Abdomen is nondistended, soft and nontender. Normal bowel sounds heard. Central nervous system: Alert and oriented. No focal neurological deficits. Extremities: Symmetric 5 x 5 power.   Data Reviewed: Basic Metabolic Panel:  Recent Labs Lab 07/01/15 0905  07/04/15 1100 07/04/15 1103 07/04/15 1337 07/05/15 0135  NA 143 140 141  --  142  K 3.6 3.7 3.7  --  4.0  CL  --  107 105  --  110  CO2 25 24  --   --  24  GLUCOSE 129 137* 136*  --  113*  BUN 15.2 20 22*  --  19  CREATININE 1.3 1.44* 1.30*  --  1.40*  CALCIUM 8.8 8.8*  --   --  8.1*  MG  --   --   --  1.9  --    Liver Function Tests:  Recent Labs Lab 07/01/15 0905 07/04/15 1100 07/04/15 1337 07/05/15 0135  AST 14 32 25 22  ALT 10 22 21 21   ALKPHOS 38* 44 41 38  BILITOT 0.44 0.3 0.2* 0.4  PROT 6.5 7.0 6.6 6.3*  ALBUMIN 3.5 3.8 3.7 3.4*   No results for input(s): LIPASE, AMYLASE in the last 168 hours. No results for input(s): AMMONIA in the last 168 hours. CBC:  Recent Labs Lab 07/01/15 0904 07/04/15 1100 07/04/15 1103 07/05/15 0135  WBC 4.8 5.9  --  5.3  NEUTROABS 3.3 4.3  --   --   HGB 9.1* 10.0* 10.9* 9.3*  HCT 28.1* 29.8* 32.0* 28.2*  MCV 82.6 82.5  --  83.4  PLT 145 200  --  176   Cardiac Enzymes:  Recent Labs Lab 07/04/15 1337 07/04/15 1903 07/05/15 0135  TROPONINI 0.08* 0.08* 0.06*   BNP (last 3 results) No results for input(s): PROBNP in the last 8760 hours. CBG: No results for input(s):  GLUCAP in the last 168 hours.  Recent Results (from the past 240 hour(s))  TECHNOLOGIST REVIEW     Status: None   Collection Time: 07/01/15  9:04 AM  Result Value Ref Range Status   Technologist Review   Final    Sl poly, Mod ovalos, Few Schistocytes, helmet, target, and burr cells  MRSA PCR Screening     Status: None   Collection Time: 07/04/15  3:53 PM  Result Value Ref Range Status   MRSA by PCR NEGATIVE NEGATIVE Final    Comment:        The GeneXpert MRSA Assay (FDA approved for NASAL specimens only), is one component of a comprehensive MRSA colonization surveillance program. It is not intended to diagnose MRSA infection nor to guide or monitor treatment for MRSA infections.       Studies: Ct Angio Chest Pe W/cm &/or Wo Cm  07/04/2015    CLINICAL DATA:  Shortness of breath.  EXAM: CT ANGIOGRAPHY CHEST WITH CONTRAST  TECHNIQUE: Multidetector CT imaging of the chest was performed using the standard protocol during bolus administration of intravenous contrast. Multiplanar CT image reconstructions and MIPs were obtained to evaluate the vascular anatomy.  CONTRAST:  57mL OMNIPAQUE IOHEXOL 350 MG/ML SOLN  COMPARISON:  CT scan of January 22, 2014.  FINDINGS: No pneumothorax is noted. Minimal bilateral pleural effusions are noted with adjacent subsegmental atelectasis. 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 There is no evidence of thoracic aortic dissection or aneurysm. Large bilateral pulmonary emboli are noted. RV/LV ratio greater than 2 is noted suggesting right heart strain. Right internal jugular Port-A-Cath is noted with distal tip at expected position of the cavoatrial junction. No significant osseous abnormality is noted.  Within visualized portion of the abdomen, enlarged retroperitoneal adenopathy is noted concerning for metastatic disease. Largest lymph node visualized measures 4.1 x 3.9 cm.  Review of the MIP images confirms the above findings.  IMPRESSION: Enlarged retroperitoneal adenopathy is noted within the visualized portion of the abdomen consistent with metastatic disease.  Positive for bilateral large acute PE with CT evidence of right heart strain (RV/LV Ratio = 2) consistent with at least submassive (intermediate risk) PE. The presence of right heart strain has been associated with an increased risk of morbidity and mortality. Please activate Code PE by paging [phone]. Critical Value/emergent results were called by telephone at the time of interpretation on 07/04/2015 at 12:35 pm to Dr. Sherwood Gambler , who verbally acknowledged these results.   Electronically Signed   By: Marijo Conception, [name]   On: 07/04/2015 12:35        Scheduled Meds: . calcium carbonate  1,250 mg Oral BID  . loratadine  10 mg Oral Daily  . mirabegron ER   50 mg Oral Daily  . olaparib  400 mg Oral BID  . sodium chloride  3 mL Intravenous Q12H   Continuous Infusions: . sodium chloride 50 mL/hr at 07/05/15 0859  . heparin 1,400 Units/hr (07/05/15 0402)    Principal Problem:   Pulmonary embolism Active Problems:   Prostate cancer   Hypertension   Acute pulmonary embolism    Time spent:  73 minutes    HONGALGI,ANAND, MD, FACP, FHM. [facility] Pager [phone]  If 7PM-7AM, please contact night-coverage [email or web address] Password [email or web address] 07/05/2015, 10:24 AM    LOS: 1 day

## 2015-07-05 NOTE — Consult Note (Signed)
[place]  [phone] [phone]  Inpatient New Consult Note   Patient Care Team: Gaynelle Arabian, MD as PCP - General (Family Medicine) [name], MD (Oncology) Franchot Gallo, MD (Urology) 07/05/2015  REFERRAL PHYSICIAN: Dr. Vernell Leep  CHIEF COMPLAINTS/PURPOSE OF CONSULTATION:  Submassive PE   HISTORY OF PRESENTING ILLNESS:  Rudene Re. 69 y.o. 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 He has been following up with my partner Dr. Alen Blew. I was called to consult about his anticoagulation.  He presents with 1-2 weeks of dyspnea on exertion, which: Much worse yesterday. He presented to [facility] emergency room, CT scan showed bilateral large acute pulmonary embolism with right heart strain. He was admitted and started on IV heparin. His dyspnea has improved today, he is on nasal cannula oxygen.  Regarding his prostate cancer, he was initially diagnosed in 2001, Gleason score 6, status post prostatectomy, T3N1 disease, he subsequently developed castration resistant disease, treated with multiple line therapy. He is currently on [place] and Lupron. He also is also on Xgeva for bone metastasis.  MEDICAL HISTORY:  Past Medical History  Diagnosis Date  . Hypertension   . Osteoarthritis   . Retroperitoneal lymphadenopathy   . History of pulmonary embolus (PE)     dec 2009  . History of DVT (deep vein thrombosis)     dec 2009  . OSA on CPAP   . History of acute renal failure     W/ SEPSIS  MARCH 2014  . Urgency of urination   . ED (erectile dysfunction)   . Left renal mass     MONITORED BY UROLOGIST-  DR Diona Fanti  . Hydronephrosis, right   . Prostate cancer ONCOLOGIST-  DR Alen Blew    DX 2001 (T3 N1)-- S/P PROSTATECTOMY--/ BIOCHEMICAL RELAPSE, RECEIVED PROSTATIC BED RADIATION AND LUPRON WITH CASODEX    SINCE DEVELOPED  CASTATION RESISITENT METASTATIC PROSTATE ADENOCARINOMA--  CURRENT THERAPY CHEMOTHEAPY EVERY 3 WEEKS AND LUPRON INJECTIONS EVERY 4 MONTHS  . Fatigue   . Chemotherapy-induced nausea   . Metastasis to bone     PRIMARY PROSTATE CANCER    SURGICAL HISTORY: Past Surgical History  Procedure Laterality Date  . Total knee arthroplasty Bilateral RIGHT  04-24-2009/   LEFT  2005  . Insertion ivc filter and removal  03-25-2009/   REMOVAL 05-29-2009  . Prostatectomy  MAY 2001  . Transthoracic echocardiogram  12-27-2012    MILD LVH/  EF 56%/  GRADE I DIASTOLIC DYSFUNCTION  . Inguinal hernia repair Right 2010  . Penile prosthesis implant  2005  . Cystoscopy w/ ureteral stent placement Right 10/22/2013    Procedure: CYSTOSCOPY WITH STENT REPLACEMENT;  Surgeon: Franchot Gallo, MD;  Location: [facility];  Service: Urology;  Laterality: Right;  . Portacath placement  02-06-2014  . Colonoscopy w/ polypectomy  05-23-2013  . Cystoscopy w/ ureteral stent placement Right 06/24/2014    Procedure: CYSTOSCOPY WITH RETROGRADE PYELOGRAM/ URETERAL STENT REPLACEMENT WITH URETERAL DILATION, RIGHT;  Surgeon: Jorja Loa, MD;  Location: [facility];  Service: Urology;  Laterality: Right;  . Cystoscopy w/ ureteral stent placement Right 11/22/2014    Procedure: CYSTOSCOPY WITH STENT REPLACEMENT;  Surgeon: Jorja Loa, MD;  Location: [facility];  Service: Urology;  Laterality: Right;    SOCIAL HISTORY: Social History   Social History  . Marital Status: Married  Spouse Name: N/A  . Number of Children: N/A  . Years of Education: N/A   Occupational History  . Not on file.   Social History Main Topics  . Smoking status: Never Smoker   . Smokeless tobacco: Never Used  . Alcohol Use: 3.6 oz/week    6 Cans of beer per week  . Drug Use: No  . Sexual Activity: Not on file   Other Topics Concern  . Not on file   Social History  Narrative    FAMILY HISTORY: Family History  Problem Relation Age of Onset  . Colon cancer Neg Hx   . Heart disease Mother     ALLERGIES:  has No Known Allergies.  MEDICATIONS:  Current Facility-Administered Medications  Medication Dose Route Frequency Provider Last Rate Last Dose  . 0.9 %  sodium chloride infusion   Intravenous Continuous [name], MD 50 mL/hr at 07/05/15 0859    . acetaminophen (TYLENOL) tablet 650 mg  650 mg Oral Q6H PRN Reyne Dumas, MD      . calcium carbonate (OS-CAL - dosed in mg of elemental calcium) tablet 1,250 mg  1,250 mg Oral BID [name], MD   1,250 mg at 07/05/15 1057  . heparin ADULT infusion 100 units/mL (25000 units/250 mL)  1,400 Units/hr Intravenous Continuous Anh P Pham, RPH 14 mL/hr at 07/05/15 0402 1,400 Units/hr at 07/05/15 0402  . HYDROmorphone (DILAUDID) injection 1 mg  1 mg Intravenous Q4H PRN [name], MD   1 mg at 07/05/15 1006  . levalbuterol (XOPENEX) nebulizer solution 0.63 mg  0.63 mg Nebulization Q6H PRN Reyne Dumas, MD      . lidocaine-prilocaine (EMLA) cream 1 application  1 application Topical PRN [name], MD      . loratadine (CLARITIN) tablet 10 mg  10 mg Oral Daily [name], MD   10 mg at 07/05/15 1058  . mirabegron ER (MYRBETRIQ) tablet 50 mg  50 mg Oral Daily [name], MD   50 mg at 07/05/15 1058  . ondansetron (ZOFRAN) tablet 4 mg  4 mg Oral Q6H PRN Reyne Dumas, MD      . oxyCODONE (Oxy IR/ROXICODONE) immediate release tablet 5 mg  5 mg Oral Q4H PRN [name], MD   5 mg at 07/05/15 0859  . polyethylene glycol (MIRALAX / GLYCOLAX) packet 17 g  17 g Oral Daily PRN Reyne Dumas, MD   17 g at 07/05/15 1057  . sodium chloride 0.9 % injection 3 mL  3 mL Intravenous Q12H Reyne Dumas, MD        REVIEW OF SYSTEMS:   Constitutional: Denies fevers, chills or abnormal night sweats Eyes: Denies blurriness of vision, double vision or watery eyes Ears, nose, mouth, throat, and  face: Denies mucositis or sore throat Respiratory: Denies cough, dyspnea or wheezes Cardiovascular: Denies palpitation, chest discomfort or lower extremity swelling Gastrointestinal:  Denies nausea, heartburn or change in bowel habits Skin: Denies abnormal skin rashes Lymphatics: Denies new lymphadenopathy or easy bruising Neurological:Denies numbness, tingling or new weaknesses Behavioral/Psych: Mood is stable, no new changes  All other systems were reviewed with the patient and are negative.  PHYSICAL EXAMINATION: ECOG PERFORMANCE STATUS: 1 - Symptomatic but completely ambulatory  Filed Vitals:   07/05/15 1200  BP:   Pulse:   Temp: 97.6 F (36.4 C)  Resp:    Filed Weights   07/04/15 1242 07/04/15 1700 07/05/15 0400  Weight: 190 lb (86.183 kg) 202 lb  6.1 oz (91.8 kg) 206 lb 5.6 oz (93.6 kg)    GENERAL:alert, no distress and comfortable SKIN: skin color, texture, turgor are normal, no rashes or significant lesions EYES: normal, conjunctiva are pink and non-injected, sclera clear OROPHARYNX:no exudate, no erythema and lips, buccal mucosa, and tongue normal  NECK: supple, thyroid normal size, non-tender, without nodularity LYMPH:  no palpable lymphadenopathy in the cervical, axillary or inguinal LUNGS: clear to auscultation and percussion with normal breathing effort HEART: regular rate & rhythm and no murmurs and no lower extremity edema ABDOMEN:abdomen soft, non-tender and normal bowel sounds Musculoskeletal:no cyanosis of digits and no clubbing  PSYCH: alert & oriented x 3 with fluent speech NEURO: no focal motor/sensory deficits  LABORATORY DATA:  I have reviewed the data as listed Recent Results (from the past 2160 hour(s))  CBC with Differential/Platelet     Status: Abnormal   Collection Time: 04/24/15  9:12 AM  Result Value Ref Range   WBC 6.8 4.0 - 10.3 10e3/uL   NEUT# 4.9 1.5 - 6.5 10e3/uL   HGB 10.0 (L) 13.0 - 17.1 g/dL   HCT 31.7 (L) 38.4 - 49.9 %   Platelets  227 140 - 400 10e3/uL   MCV 81.7 79.3 - 98.0 fL   MCH 25.8 (L) 27.2 - 33.4 pg   MCHC 31.5 (L) 32.0 - 36.0 g/dL   RBC 3.88 (L) 4.20 - 5.82 10e6/uL   RDW 17.1 (H) 11.0 - 14.6 %   lymph# 1.2 0.9 - 3.3 10e3/uL   MONO# 0.5 0.1 - 0.9 10e3/uL   Eosinophils Absolute 0.1 0.0 - 0.5 10e3/uL   Basophils Absolute 0.1 0.0 - 0.1 10e3/uL   NEUT% 71.5 39.0 - 75.0 %   LYMPH% 18.1 14.0 - 49.0 %   MONO% 7.6 0.0 - 14.0 %   EOS% 2.1 0.0 - 7.0 %   BASO% 0.7 0.0 - 2.0 %   nRBC 0 0 - 0 %  Comprehensive metabolic panel     Status: Abnormal   Collection Time: 04/24/15  9:12 AM  Result Value Ref Range   Sodium 142 136 - 145 mEq/L   Potassium 3.9 3.5 - 5.1 mEq/L   Chloride 107 98 - 109 mEq/L   CO2 27 22 - 29 mEq/L   Glucose 109 70 - 140 mg/dl   BUN 19.4 7.0 - 26.0 mg/dL   Creatinine 1.1 0.7 - 1.3 mg/dL   Total Bilirubin 0.42 0.20 - 1.20 mg/dL   Alkaline Phosphatase 54 40 - 150 U/L   AST 12 5 - 34 U/L   ALT 7 0 - 55 U/L   Total Protein 7.0 6.4 - 8.3 g/dL   Albumin 3.7 3.5 - 5.0 g/dL   Calcium 9.5 8.4 - 10.4 mg/dL   Anion Gap 8 3 - 11 mEq/L   EGFR 82 (L) >90 ml/min/1.73 m2    Comment: eGFR is calculated using the CKD-EPI Creatinine Equation (2009)  PSA     Status: Abnormal   Collection Time: 04/24/15  9:12 AM  Result Value Ref Range   PSA 1306.00 (H) <=4.00 ng/mL    Comment: Result repeated and verified.Result confirmed by automatic dilution.Test Methodology: ECLIA PSA (Electrochemiluminescence Immunoassay) For PSA values from 2.5-4.0, particularly in younger men <77 yearsold, the AUA and NCCN suggest testing for % Free PSA  (3515) andevaluation of the rate of increase in PSA (PSA velocity).   CBC with Differential/Platelet     Status: Abnormal   Collection Time: 05/14/15  8:09 AM  Result Value  Ref Range   WBC 4.7 4.0 - 10.3 10e3/uL   NEUT# 3.4 1.5 - 6.5 10e3/uL   HGB 9.4 (L) 13.0 - 17.1 g/dL   HCT 29.7 (L) 38.4 - 49.9 %   Platelets 172 140 - 400 10e3/uL   MCV 80.4 79.3 - 98.0 fL   MCH 25.4 (L)  27.2 - 33.4 pg   MCHC 31.6 (L) 32.0 - 36.0 g/dL   RBC 3.69 (L) 4.20 - 5.82 10e6/uL   RDW 16.8 (H) 11.0 - 14.6 %   lymph# 0.8 (L) 0.9 - 3.3 10e3/uL   MONO# 0.3 0.1 - 0.9 10e3/uL   Eosinophils Absolute 0.2 0.0 - 0.5 10e3/uL   Basophils Absolute 0.1 0.0 - 0.1 10e3/uL   NEUT% 72.1 39.0 - 75.0 %   LYMPH% 16.3 14.0 - 49.0 %   MONO% 5.7 0.0 - 14.0 %   EOS% 4.7 0.0 - 7.0 %   BASO% 1.2 0.0 - 2.0 %  Comprehensive metabolic panel     Status: Abnormal   Collection Time: 05/14/15  8:09 AM  Result Value Ref Range   Sodium 142 136 - 145 mEq/L   Potassium 3.8 3.5 - 5.1 mEq/L   Chloride 107 98 - 109 mEq/L   CO2 28 22 - 29 mEq/L   Glucose 132 70 - 140 mg/dl   BUN 15.9 7.0 - 26.0 mg/dL   Creatinine 1.4 (H) 0.7 - 1.3 mg/dL   Total Bilirubin 0.48 0.20 - 1.20 mg/dL   Alkaline Phosphatase 44 40 - 150 U/L   AST 13 5 - 34 U/L   ALT 10 0 - 55 U/L   Total Protein 6.8 6.4 - 8.3 g/dL   Albumin 3.6 3.5 - 5.0 g/dL   Calcium 9.0 8.4 - 10.4 mg/dL   Anion Gap 7 3 - 11 mEq/L   EGFR 60 (L) >90 ml/min/1.73 m2    Comment: eGFR is calculated using the CKD-EPI Creatinine Equation (2009)  PSA     Status: Abnormal   Collection Time: 05/14/15  8:09 AM  Result Value Ref Range   PSA 1380.00 (H) <=4.00 ng/mL    Comment: Result repeated and verified.Result confirmed by automatic dilution.Test Methodology: ECLIA PSA (Electrochemiluminescence Immunoassay) For PSA values from 2.5-4.0, particularly in younger men <28 yearsold, the AUA and NCCN suggest testing for % Free PSA  (3515) andevaluation of the rate of increase in PSA (PSA velocity).   CBC with Differential/Platelet     Status: Abnormal   Collection Time: 07/01/15  9:04 AM  Result Value Ref Range   WBC 4.8 4.0 - 10.3 10e3/uL   NEUT# 3.3 1.5 - 6.5 10e3/uL   HGB 9.1 (L) 13.0 - 17.1 g/dL   HCT 28.1 (L) 38.4 - 49.9 %   Platelets 145 140 - 400 10e3/uL   MCV 82.6 79.3 - 98.0 fL   MCH 26.8 (L) 27.2 - 33.4 pg   MCHC 32.4 32.0 - 36.0 g/dL   RBC 3.40 (L) 4.20 - 5.82  10e6/uL   RDW 23.4 (H) 11.0 - 14.6 %   lymph# 1.0 0.9 - 3.3 10e3/uL   MONO# 0.4 0.1 - 0.9 10e3/uL   Eosinophils Absolute 0.1 0.0 - 0.5 10e3/uL   Basophils Absolute 0.0 0.0 - 0.1 10e3/uL   NEUT% 69.9 39.0 - 75.0 %   LYMPH% 20.1 14.0 - 49.0 %   MONO% 7.5 0.0 - 14.0 %   EOS% 1.9 0.0 - 7.0 %   BASO% 0.6 0.0 - 2.0 %   nRBC 1 (H)  0 - 0 %  TECHNOLOGIST REVIEW     Status: None   Collection Time: 07/01/15  9:04 AM  Result Value Ref Range   Technologist Review      Sl poly, Mod ovalos, Few Schistocytes, helmet, target, and burr cells  PSA     Status: Abnormal   Collection Time: 07/01/15  9:04 AM  Result Value Ref Range   PSA 1519.00 (H) <=4.00 ng/mL    Comment: Result repeated and verified.Result confirmed by automatic dilution.Test Methodology: ECLIA PSA (Electrochemiluminescence Immunoassay) For PSA values from 2.5-4.0, particularly in younger men <2 yearsold, the AUA and NCCN suggest testing for % Free PSA  (3515) andevaluation of the rate of increase in PSA (PSA velocity).   Comprehensive metabolic panel     Status: Abnormal   Collection Time: 07/01/15  9:05 AM  Result Value Ref Range   Sodium 143 136 - 145 mEq/L   Potassium 3.6 3.5 - 5.1 mEq/L   Chloride 108 98 - 109 mEq/L   CO2 25 22 - 29 mEq/L   Glucose 129 70 - 140 mg/dl    Comment: Glucose reference range is for nonfasting patients. Fasting glucose reference range is 70- 100.   BUN 15.2 7.0 - 26.0 mg/dL   Creatinine 1.3 0.7 - 1.3 mg/dL   Total Bilirubin 0.44 0.20 - 1.20 mg/dL   Alkaline Phosphatase 38 (L) 40 - 150 U/L   AST 14 5 - 34 U/L   ALT 10 0 - 55 U/L   Total Protein 6.5 6.4 - 8.3 g/dL   Albumin 3.5 3.5 - 5.0 g/dL   Calcium 8.8 8.4 - 10.4 mg/dL   Anion Gap 9 3 - 11 mEq/L   EGFR 63 (L) >90 ml/min/1.73 m2    Comment: eGFR is calculated using the CKD-EPI Creatinine Equation (2009)  Comprehensive metabolic panel     Status: Abnormal   Collection Time: 07/04/15 11:00 AM  Result Value Ref Range   Sodium 140 135 - 145  mmol/L   Potassium 3.7 3.5 - 5.1 mmol/L   Chloride 107 101 - 111 mmol/L   CO2 24 22 - 32 mmol/L   Glucose, Bld 137 (H) 65 - 99 mg/dL   BUN 20 6 - 20 mg/dL   Creatinine, Ser 1.44 (H) 0.61 - 1.24 mg/dL   Calcium 8.8 (L) 8.9 - 10.3 mg/dL   Total Protein 7.0 6.5 - 8.1 g/dL   Albumin 3.8 3.5 - 5.0 g/dL   AST 32 15 - 41 U/L   ALT 22 17 - 63 U/L   Alkaline Phosphatase 44 38 - 126 U/L   Total Bilirubin 0.3 0.3 - 1.2 mg/dL   GFR calc non Af Amer 48 (L) >60 mL/min   GFR calc Af Amer 56 (L) >60 mL/min    Comment: (NOTE) The eGFR has been calculated using the CKD EPI equation. This calculation has not been validated in all clinical situations. eGFR's persistently <60 mL/min signify possible Chronic Kidney Disease.    Anion gap 9 5 - 15  Brain natriuretic peptide     Status: Abnormal   Collection Time: 07/04/15 11:00 AM  Result Value Ref Range   B Natriuretic Peptide 581.8 (H) 0.0 - 100.0 pg/mL  CBC with Differential     Status: Abnormal   Collection Time: 07/04/15 11:00 AM  Result Value Ref Range   WBC 5.9 4.0 - 10.5 K/uL    Comment: ADJUSTED FOR NUCLEATED RBC'S   RBC 3.61 (L) 4.22 - 5.81 MIL/uL  Hemoglobin 10.0 (L) 13.0 - 17.0 g/dL   HCT 29.8 (L) 39.0 - 52.0 %   MCV 82.5 78.0 - 100.0 fL   MCH 27.7 26.0 - 34.0 pg   MCHC 33.6 30.0 - 36.0 g/dL   RDW 23.8 (H) 11.5 - 15.5 %   Platelets 200 150 - 400 K/uL    Comment: SPECIMEN CHECKED FOR CLOTS REPEATED TO VERIFY    Neutrophils Relative % 75 %   Lymphocytes Relative 15 %   Monocytes Relative 8 %   Eosinophils Relative 1 %   Basophils Relative 1 %   nRBC 9 (H) 0 /100 WBC   Neutro Abs 4.3 1.7 - 7.7 K/uL   Lymphs Abs 0.9 0.7 - 4.0 K/uL   Monocytes Absolute 0.5 0.1 - 1.0 K/uL   Eosinophils Absolute 0.1 0.0 - 0.7 K/uL   Basophils Absolute 0.1 0.0 - 0.1 K/uL   RBC Morphology POLYCHROMASIA PRESENT     Comment: SCHISTOCYTES PRESENT (2-5/hpf) ELLIPTOCYTES BURR CELLS RARE NRBCs    WBC Morphology VACUOLATED NEUTROPHILS    Smear  Review LARGE PLATELETS PRESENT   Protime-INR     Status: None   Collection Time: 07/04/15 11:00 AM  Result Value Ref Range   Prothrombin Time 14.4 11.6 - 15.2 seconds   INR 1.10 0.00 - 1.49  I-stat Chem 8, ED     Status: Abnormal   Collection Time: 07/04/15 11:03 AM  Result Value Ref Range   Sodium 141 135 - 145 mmol/L   Potassium 3.7 3.5 - 5.1 mmol/L   Chloride 105 101 - 111 mmol/L   BUN 22 (H) 6 - 20 mg/dL   Creatinine, Ser 1.30 (H) 0.61 - 1.24 mg/dL   Glucose, Bld 136 (H) 65 - 99 mg/dL   Calcium, Ion 1.11 (L) 1.13 - 1.30 mmol/L   TCO2 23 0 - 100 mmol/L   Hemoglobin 10.9 (L) 13.0 - 17.0 g/dL   HCT 32.0 (L) 39.0 - 52.0 %  I-stat troponin, ED     Status: None   Collection Time: 07/04/15 11:24 AM  Result Value Ref Range   Troponin i, poc 0.08 0.00 - 0.08 ng/mL   Comment 3            Comment: Due to the release kinetics of cTnI, a negative result within the first hours of the onset of symptoms does not rule out myocardial infarction with certainty. If myocardial infarction is still suspected, repeat the test at appropriate intervals.   Hepatic function panel     Status: Abnormal   Collection Time: 07/04/15  1:37 PM  Result Value Ref Range   Total Protein 6.6 6.5 - 8.1 g/dL   Albumin 3.7 3.5 - 5.0 g/dL   AST 25 15 - 41 U/L   ALT 21 17 - 63 U/L   Alkaline Phosphatase 41 38 - 126 U/L   Total Bilirubin 0.2 (L) 0.3 - 1.2 mg/dL   Bilirubin, Direct <0.1 (L) 0.1 - 0.5 mg/dL   Indirect Bilirubin NOT CALCULATED 0.3 - 0.9 mg/dL  Magnesium     Status: None   Collection Time: 07/04/15  1:37 PM  Result Value Ref Range   Magnesium 1.9 1.7 - 2.4 mg/dL  Troponin I     Status: Abnormal   Collection Time: 07/04/15  1:37 PM  Result Value Ref Range   Troponin I 0.08 (H) <0.031 ng/mL    Comment:        PERSISTENTLY INCREASED TROPONIN VALUES IN THE RANGE OF 0.04-0.49 ng/mL  CAN BE SEEN IN:       -UNSTABLE ANGINA       -CONGESTIVE HEART FAILURE       -MYOCARDITIS       -CHEST TRAUMA        -ARRYHTHMIAS       -LATE PRESENTING MYOCARDIAL INFARCTION       -COPD   CLINICAL FOLLOW-UP RECOMMENDED.   MRSA PCR Screening     Status: None   Collection Time: 07/04/15  3:53 PM  Result Value Ref Range   MRSA by PCR NEGATIVE NEGATIVE    Comment:        The GeneXpert MRSA Assay (FDA approved for NASAL specimens only), is one component of a comprehensive MRSA colonization surveillance program. It is not intended to diagnose MRSA infection nor to guide or monitor treatment for MRSA infections.   Heparin level (unfractionated)     Status: None   Collection Time: 07/04/15  7:03 PM  Result Value Ref Range   Heparin Unfractionated 0.58 0.30 - 0.70 IU/mL    Comment:        IF HEPARIN RESULTS ARE BELOW EXPECTED VALUES, AND PATIENT DOSAGE HAS BEEN CONFIRMED, SUGGEST FOLLOW UP TESTING OF ANTITHROMBIN III LEVELS.   Troponin I     Status: Abnormal   Collection Time: 07/04/15  7:03 PM  Result Value Ref Range   Troponin I 0.08 (H) <0.031 ng/mL    Comment:        PERSISTENTLY INCREASED TROPONIN VALUES IN THE RANGE OF 0.04-0.49 ng/mL CAN BE SEEN IN:       -UNSTABLE ANGINA       -CONGESTIVE HEART FAILURE       -MYOCARDITIS       -CHEST TRAUMA       -ARRYHTHMIAS       -LATE PRESENTING MYOCARDIAL INFARCTION       -COPD   CLINICAL FOLLOW-UP RECOMMENDED.   Troponin I     Status: Abnormal   Collection Time: 07/05/15  1:35 AM  Result Value Ref Range   Troponin I 0.06 (H) <0.031 ng/mL    Comment:        PERSISTENTLY INCREASED TROPONIN VALUES IN THE RANGE OF 0.04-0.49 ng/mL CAN BE SEEN IN:       -UNSTABLE ANGINA       -CONGESTIVE HEART FAILURE       -MYOCARDITIS       -CHEST TRAUMA       -ARRYHTHMIAS       -LATE PRESENTING MYOCARDIAL INFARCTION       -COPD   CLINICAL FOLLOW-UP RECOMMENDED.   CBC     Status: Abnormal   Collection Time: 07/05/15  1:35 AM  Result Value Ref Range   WBC 5.3 4.0 - 10.5 K/uL   RBC 3.38 (L) 4.22 - 5.81 MIL/uL   Hemoglobin 9.3 (L) 13.0 - 17.0  g/dL   HCT 28.2 (L) 39.0 - 52.0 %   MCV 83.4 78.0 - 100.0 fL   MCH 27.5 26.0 - 34.0 pg   MCHC 33.0 30.0 - 36.0 g/dL   RDW 23.6 (H) 11.5 - 15.5 %   Platelets 176 150 - 400 K/uL  Comprehensive metabolic panel     Status: Abnormal   Collection Time: 07/05/15  1:35 AM  Result Value Ref Range   Sodium 142 135 - 145 mmol/L   Potassium 4.0 3.5 - 5.1 mmol/L   Chloride 110 101 - 111 mmol/L   CO2 24 22 - 32 mmol/L  Glucose, Bld 113 (H) 65 - 99 mg/dL   BUN 19 6 - 20 mg/dL   Creatinine, Ser 1.40 (H) 0.61 - 1.24 mg/dL   Calcium 8.1 (L) 8.9 - 10.3 mg/dL   Total Protein 6.3 (L) 6.5 - 8.1 g/dL   Albumin 3.4 (L) 3.5 - 5.0 g/dL   AST 22 15 - 41 U/L   ALT 21 17 - 63 U/L   Alkaline Phosphatase 38 38 - 126 U/L   Total Bilirubin 0.4 0.3 - 1.2 mg/dL   GFR calc non Af Amer 50 (L) >60 mL/min   GFR calc Af Amer 58 (L) >60 mL/min    Comment: (NOTE) The eGFR has been calculated using the CKD EPI equation. This calculation has not been validated in all clinical situations. eGFR's persistently <60 mL/min signify possible Chronic Kidney Disease.    Anion gap 8 5 - 15  Heparin level (unfractionated)     Status: None   Collection Time: 07/05/15  1:35 AM  Result Value Ref Range   Heparin Unfractionated 0.53 0.30 - 0.70 IU/mL    Comment:        IF HEPARIN RESULTS ARE BELOW EXPECTED VALUES, AND PATIENT DOSAGE HAS BEEN CONFIRMED, SUGGEST FOLLOW UP TESTING OF ANTITHROMBIN III LEVELS.     RADIOGRAPHIC STUDIES: I have personally reviewed the radiological images as listed and agreed with the findings in the report.  Ct Angio Chest Pe W/cm &/or Wo Cm 07/04/2015    FINDINGS: No pneumothorax is noted. Minimal bilateral pleural effusions are noted with adjacent subsegmental atelectasis. 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 There is no evidence of thoracic aortic dissection or aneurysm. Large bilateral pulmonary emboli are noted. RV/LV ratio greater than 2 is noted suggesting right heart strain. Right internal  jugular Port-A-Cath is noted with distal tip at expected position of the cavoatrial junction. No significant osseous abnormality is noted.  Within visualized portion of the abdomen, enlarged retroperitoneal adenopathy is noted concerning for metastatic disease. Largest lymph node visualized measures 4.1 x 3.9 cm.  Review of the MIP images confirms the above findings.   IMPRESSION: Enlarged retroperitoneal adenopathy is noted within the visualized portion of the abdomen consistent with metastatic disease.  Positive for bilateral large acute PE with CT evidence of right heart strain (RV/LV Ratio = 2) consistent with at least submassive (intermediate risk) PE. The presence of right heart strain has been associated with an increased risk of morbidity and mortality. Please activate Code PE by paging [phone]. Critical Value/emergent results were called by telephone at the time of interpretation on 07/04/2015 at 12:35 pm to Dr. Sherwood Gambler , who verbally acknowledged these results.   Electronically Signed   By: Marijo Conception, [name]   On: 07/04/2015 12:35   Echo 07/04/2015 Impressions:  - Severely dilated RV with moderately decreased systolic function and McConnell sign consistent with pulmonary embolism. Moderate to severe pulmonary hypertension.   ASSESSMENT and PLAN :  69 year old African-American male, with metastatic prostate cancer, on Olaparib and Lupron, presented with submassive PE. He had a history of left lower extremity DVT after total knee replacement.  1. Submassive PE  I reviewed with the patient about the plan for care for recurrent DVT/PE.  Hypercoagulopathy workup His PE is unprovoked, likely related to his metastatic prostate cancer. Olaparib also slightly increase her risk of thrombosis (1-10%). He has no family history of blood clots. His previous DVT was related to the knee replacement. I do not think he needs hypercoagulopathy work up.  Duration of  anticoagulation Giving the severity of his PE, underlying incurable nature of his prostate cancer, I recommend lifelong anticoagulation, if no contraindications such as severe bleeding. He does not appear to have other high risk for bleeding at this point, except his old age.  Anticoagulation options We discussed about various options of anticoagulation therapies including warfarin, low molecular weight heparin such as Lovenox, which are probably the better option for malignancy related thrombosis. Newer agents such as Rivaroxaban has not been tested extensively in malignancy related thrombosis. His creatinine is slightly increased, but EGFR still adequate for Lovenox, and I would recommend Lovenox 1 mg/kg every 12 hours.   2. Metastatic breast cancer -I do not think Olaparib is the main cause of his PE, I recommend him to continue for now. I did check with his primary oncologist Dr. Alen Blew today and he agrees with me. -He will follow-up with Dr. Alen Blew in the clinic.  I will follow up as need tomorrow. Please call me if you have any questions.     Truitt Merle, MD 07/05/2015 1:55 PM

## 2015-07-06 DIAGNOSIS — J9601 Acute respiratory failure with hypoxia: Secondary | ICD-10-CM | POA: Insufficient documentation

## 2015-07-06 DIAGNOSIS — I10 Essential (primary) hypertension: Secondary | ICD-10-CM

## 2015-07-06 LAB — CBC
HCT: 27.9 % — ABNORMAL LOW (ref 39.0–52.0)
Hemoglobin: 9.1 g/dL — ABNORMAL LOW (ref 13.0–17.0)
MCH: 27.3 pg (ref 26.0–34.0)
MCHC: 32.6 g/dL (ref 30.0–36.0)
MCV: 83.8 fL (ref 78.0–100.0)
PLATELETS: 168 10*3/uL (ref 150–400)
RBC: 3.33 MIL/uL — AB (ref 4.22–5.81)
RDW: 24.4 % — ABNORMAL HIGH (ref 11.5–15.5)
WBC: 4.5 10*3/uL (ref 4.0–10.5)

## 2015-07-06 LAB — HEPARIN LEVEL (UNFRACTIONATED): HEPARIN UNFRACTIONATED: 0.34 [IU]/mL (ref 0.30–0.70)

## 2015-07-06 MED ORDER — ENOXAPARIN SODIUM 100 MG/ML ~~LOC~~ SOLN
90.0000 mg | Freq: Two times a day (BID) | SUBCUTANEOUS | Status: DC
  Administered 2015-07-06 – 2015-07-08 (×5): 90 mg via SUBCUTANEOUS
  Filled 2015-07-06 (×7): qty 1

## 2015-07-06 MED ORDER — ENSURE ENLIVE PO LIQD
237.0000 mL | Freq: Three times a day (TID) | ORAL | Status: DC
  Administered 2015-07-07 – 2015-07-08 (×2): 237 mL via ORAL

## 2015-07-06 MED ORDER — SODIUM CHLORIDE 0.9 % IJ SOLN
10.0000 mL | INTRAMUSCULAR | Status: DC | PRN
  Administered 2015-07-08 (×2): 10 mL
  Filled 2015-07-06 (×2): qty 40

## 2015-07-06 NOTE — Progress Notes (Signed)
PULMONARY / CRITICAL CARE MEDICINE   Name: [name]. MRN: [id number] DOB: [birth year]    ADMISSION DATE:  07/04/2015 CONSULTATION DATE:  07/04/2015  REFERRING MD :  Regenia Skeeter, EDP  CHIEF COMPLAINT:  Shortness of breath  INITIAL PRESENTATION: 69 year old male with prostate cancer was admitted on 07/04/2015 with a submassive pulmonary embolism. Has a history of pulmonary embolism several years ago associated with knee replacement surgery of the right leg. Is currently receiving treatment for prostate cancer.  STUDIES:  07/04/2015 CT chest> acute bilateral pulmonary emboli in main pulmonary arteries but not a saddle embolus, RV size increased, atelectasis right base, trace effusion right base 07/04/15 LE dopplers> Lt DVT 9/23 Echo > - Severely dilated RV with moderately decreased systolic function and McConnell sign consistent with pulmonary embolism. Moderate to severe pulmonary hypertension.  SIGNIFICANT EVENTS:  HISTORY OF PRESENT ILLNESS:  This is a pleasant 69 year old male who is currently receiving treatment for prostate cancer who was admitted to [facility] long hospital on 07/04/2015 after complaining of one week of shortness of breath. He stated that he had just been having increasing dyspnea for proximally one week prior to admission. He notes that previously he's had a DVT after a total knee replacement of the right knee several years ago. He was treated for a brief period of time with blood thinners in the never had a blood clot after that. He says that that one did go to his lungs. He notes that he's been treated for prostate cancer recently as guided by our local cancer Center. For the last week he said increasing shortness of breath, no chest pain, no cough, no hemoptysis. He did not think he been having leg swelling.  PAST MEDICAL HISTORY :   has a past medical history of Hypertension; Osteoarthritis; Retroperitoneal lymphadenopathy; History of pulmonary embolus (PE);  History of DVT (deep vein thrombosis); OSA on CPAP; History of acute renal failure; Urgency of urination; ED (erectile dysfunction); Left renal mass; Hydronephrosis, right; Prostate cancer (ONCOLOGIST-  DR Alen Blew); Fatigue; Chemotherapy-induced nausea; and Metastasis to bone.  has past surgical history that includes Total knee arthroplasty (Bilateral, RIGHT  04-24-2009/   LEFT  2005); INSERTION IVC FILTER AND REMOVAL (03-25-2009/   REMOVAL 05-29-2009); Prostatectomy (MAY 2001); transthoracic echocardiogram (12-27-2012); Inguinal hernia repair (Right, 2010); Penile prosthesis implant (2005); Cystoscopy w/ ureteral stent placement (Right, 10/22/2013); Portacath placement (02-06-2014); Colonoscopy w/ polypectomy (05-23-2013); Cystoscopy w/ ureteral stent placement (Right, 06/24/2014); and Cystoscopy w/ ureteral stent placement (Right, 11/22/2014). Prior to Admission medications   Medication Sig Start Date End Date Taking? Authorizing Provider  aspirin EC 81 MG tablet Take 162 mg by mouth every evening.    Yes Historical Provider, MD  calcium carbonate (OS-CAL) 1250 MG chewable tablet Chew 1 tablet by mouth 2 (two) times daily.    Yes Historical Provider, MD  leuprolide (LUPRON) 30 MG injection Inject 30 mg into the muscle every 4 (four) months. LAST INJECTION JAN 2016   Yes Historical Provider, MD  lidocaine-prilocaine (EMLA) cream Apply topically as needed. Patient taking differently: Apply 1 application topically as needed (for port).  06/05/14  Yes [name], MD  loratadine (CLARITIN) 10 MG tablet Take 10 mg by mouth daily.   Yes Historical Provider, MD  losartan-hydrochlorothiazide (HYZAAR) 50-12.5 MG per tablet Take 1 tablet by mouth every evening.  10/21/14  Yes Historical Provider, MD  olaparib (LYNPARZA) 50 MG capsule Take 400 mg by mouth 2 (two) times daily. Swallow whole.   Yes Historical Provider,  MD  ondansetron (ZOFRAN) 8 MG tablet Take 1 tablet (8 mg total) by mouth every 8 (eight) hours as  needed for nausea or vomiting. 04/24/15  Yes Adrena E Johnson, PA-C  oxyCODONE (OXY IR/ROXICODONE) 5 MG immediate release tablet Take 1 tablet (5 mg total) by mouth every 4 (four) hours as needed for severe pain. 07/02/15  Yes [name], MD  mirabegron ER (MYRBETRIQ) 50 MG TB24 tablet Take 50 mg by mouth daily. 11/22/14   Historical Provider, MD   No Known Allergies  FAMILY HISTORY:  indicated that his mother is deceased. He indicated that his father is deceased.  SOCIAL HISTORY:  reports that he has never smoked. He has never used smokeless tobacco. He reports that he drinks about 3.6 oz of alcohol per week. He reports that he does not use illicit drugs.  SUBJECTIVE:   VITAL SIGNS: Temp:  [97.2 F (36.2 C)-98.1 F (36.7 C)] 97.8 F (36.6 C) (09/25 0800) Pulse Rate:  [75-93] 75 (09/25 0600) Resp:  [17-31] 27 (09/25 0600) BP: (98-130)/(67-78) 107/73 mmHg (09/25 0600) SpO2:  [91 %-100 %] 99 % (09/25 0700) HEMODYNAMICS:   VENTILATOR SETTINGS:   INTAKE / OUTPUT:  Intake/Output Summary (Last 24 hours) at 07/06/15 1142 Last data filed at 07/06/15 0955  Gross per 24 hour  Intake 1366.33 ml  Output    675 ml  Net 691.33 ml    PHYSICAL EXAMINATION: General:  Awake, No distress Neuro:  No focal deficits HEENT:  PERRLA, EMOI Cardiovascular:  RRR, No MRG Lungs:  Clear to auscultation bilaterally Abdomen:  Bowel sounds positive, nontender nondistended Musculoskeletal:  Normal bulk and tone Skin:  Lt edema.  LABS:  CBC  Recent Labs Lab 07/04/15 1100 07/04/15 1103 07/05/15 0135 07/06/15 0340  WBC 5.9  --  5.3 4.5  HGB 10.0* 10.9* 9.3* 9.1*  HCT 29.8* 32.0* 28.2* 27.9*  PLT 200  --  176 168   Coag's  Recent Labs Lab 07/04/15 1100  INR 1.10   BMET  Recent Labs Lab 07/01/15 0905 07/04/15 1100 07/04/15 1103 07/05/15 0135  NA 143 140 141 142  K 3.6 3.7 3.7 4.0  CL  --  107 105 110  CO2 25 24  --  24  BUN 15.2 20 22* 19  CREATININE 1.3 1.44* 1.30* 1.40*   GLUCOSE 129 137* 136* 113*   Electrolytes  Recent Labs Lab 07/01/15 0905 07/04/15 1100 07/04/15 1337 07/05/15 0135  CALCIUM 8.8 8.8*  --  8.1*  MG  --   --  1.9  --    Sepsis Markers No results for input(s): LATICACIDVEN, PROCALCITON, O2SATVEN in the last 168 hours. ABG No results for input(s): PHART, PCO2ART, PO2ART in the last 168 hours. Liver Enzymes  Recent Labs Lab 07/04/15 1100 07/04/15 1337 07/05/15 0135  AST 32 25 22  ALT 22 21 21   ALKPHOS 44 41 38  BILITOT 0.3 0.2* 0.4  ALBUMIN 3.8 3.7 3.4*   Cardiac Enzymes  Recent Labs Lab 07/04/15 1337 07/04/15 1903 07/05/15 0135  TROPONINI 0.08* 0.08* 0.06*   Glucose No results for input(s): GLUCAP in the last 168 hours.  Imaging No results found.   ASSESSMENT / PLAN:  PULMONARY A: Acute submassive pulmonary embolism. sPESI score 2 (cancer, hypoxemia)  RV failure with McConnell sign. 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 He appears stable at present  and resp status continues to improve.  We will sign off. Please call if there are any new questions.   Marshell Garfinkel MD [facility] Pager [phone] If no answer or after 3pm call: [phone] 07/06/2015, 11:42 AM

## 2015-07-06 NOTE — Progress Notes (Signed)
Initial Nutrition Assessment  DOCUMENTATION CODES:   Severe malnutrition in context of chronic illness, Obesity unspecified  INTERVENTION:   Provide Ensure Enlive po TID, each supplement provides 350 kcal and 20 grams of protein Encourage PO intake RD to continue to monitor  NUTRITION DIAGNOSIS:   Malnutrition related to chronic illness, cancer and cancer related treatments as evidenced by energy intake < or equal to 75% for > or equal to 1 month, percent weight loss.  GOAL:   Patient will meet greater than or equal to 90% of their needs  MONITOR:   PO intake, Supplement acceptance, Labs, Weight trends, Skin, I & O's  REASON FOR ASSESSMENT:   Malnutrition Screening Tool    ASSESSMENT:   69 y.o. 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  Pt reports poor appetite and not eating anything for months. Pt denies taste changes, N/V, or aversions to food smells. Pt ordered regular diet and has not eaten yet. Pt is willing to try supplements. RD to order.  Per weight history, pt has lost 22 lb since 3/31 (10% weight loss x 6 months, significant for time frame).  Nutrition focused physical exam shows no sign of depletion of muscle mass or body fat.  Labs reviewed: Elevated Creatinine Mg WNL  Diet Order:  Diet regular Room service appropriate?: Yes; Fluid consistency:: Thin  Skin:  Reviewed, no issues  Last BM:  9/21  Height:   Ht Readings from Last 1 Encounters:  07/04/15 5\' 8"  (1.727 m)    Weight:   Wt Readings from Last 1 Encounters:  07/05/15 206 lb 5.6 oz (93.6 kg)    Ideal Body Weight:  70 kg  BMI:  Body mass index is 31.38 kg/(m^2).  Estimated Nutritional Needs:   Kcal:  2200-2400  Protein:  105-115g  Fluid:  2.3L/day  EDUCATION NEEDS:   No education needs identified at this time  Clayton Bibles, MS, RD, LDN Pager:  [phone] After Hours Pager: [phone]

## 2015-07-06 NOTE — Progress Notes (Addendum)
ANTICOAGULATION CONSULT NOTE   Pharmacy Consult for Lovenox Indication: new  pulmonary embolus  No Known Allergies  Patient Measurements: Height: 5\' 8"  (172.7 cm) Weight: 206 lb 5.6 oz (93.6 kg) IBW/kg (Calculated) : 68.4 Heparin Dosing Weight: 83 kg  Vital Signs: Temp: 97.8 F (36.6 C) (09/25 0800) Temp Source: Axillary (09/25 0800) BP: 107/73 mmHg (09/25 0600) Pulse Rate: 75 (09/25 0600)  Labs:  Recent Labs  07/04/15 1100 07/04/15 1103 07/04/15 1337  07/04/15 1903 07/05/15 0135 07/05/15 1549 07/06/15 0340  HGB 10.0* 10.9*  --   --   --  9.3*  --  9.1*  HCT 29.8* 32.0*  --   --   --  28.2*  --  27.9*  PLT 200  --   --   --   --  176  --  168  LABPROT 14.4  --   --   --   --   --   --   --   INR 1.10  --   --   --   --   --   --   --   HEPARINUNFRC  --   --   --   < > 0.58 0.53 0.51 0.34  CREATININE 1.44* 1.30*  --   --   --  1.40*  --   --   TROPONINI  --   --  0.08*  --  0.08* 0.06*  --   --   < > = values in this interval not displayed. Estimated Creatinine Clearance: 55.3 mL/min (by C-G formula based on Cr of 1.4).  Medical History: Past Medical History  Diagnosis Date  . Hypertension   . Osteoarthritis   . Retroperitoneal lymphadenopathy   . History of pulmonary embolus (PE)     dec 2009  . History of DVT (deep vein thrombosis)     dec 2009  . OSA on CPAP   . History of acute renal failure     W/ SEPSIS  MARCH 2014  . Urgency of urination   . ED (erectile dysfunction)   . Left renal mass     MONITORED BY UROLOGIST-  DR Diona Fanti  . Hydronephrosis, right   . Prostate cancer ONCOLOGIST-  DR Alen Blew    DX 2001 (T3 N1)-- S/P PROSTATECTOMY--/ BIOCHEMICAL RELAPSE, RECEIVED PROSTATIC BED RADIATION AND LUPRON WITH CASODEX   SINCE DEVELOPED  CASTATION RESISITENT METASTATIC PROSTATE ADENOCARINOMA--  CURRENT THERAPY CHEMOTHEAPY EVERY 3 WEEKS AND LUPRON INJECTIONS EVERY 4 MONTHS  . Fatigue   . Chemotherapy-induced nausea   . Metastasis to bone     PRIMARY  PROSTATE CANCER   Assessment: 69 y.o M with hx LE VTE 2009 after orthopedic procedure (patient does not recall oral anti-coagulant) and metastatic prostate cancer on Lynparza and lupron PTA.  He presented to the ED on 9/23 with c/o SOB.  Chest CTA showed bilateral large acute PE with right heart strain. Heparin infusion begun 9/23   Stop Heparin, begin Lovenox this am per ONC recommendation  Begin Lovenox dosing at 1mg /kg q12 hr dosing, can transition to 1.5mg /kg daily.  Renal function: Cl ~ 55 ml/min  Olaparib resumed 9/24 per ONC MD note, not considered a contribution to development of PE   Goal of Therapy:  Anti Xa level - 0.6-1 units/ml for q12 hr dosing (1-2 units/ml for q24 hr dosing)  Plan:   Lovenox 90mg  q12, begin one hour after Heparin infusion stopped  Monitor CBC, signs of bleed, consider Anti Xa level if needed/desired  Minda Ditto PharmD Pager [phone] 07/06/2015, 8:47 AM

## 2015-07-06 NOTE — Progress Notes (Signed)
SATURATION QUALIFICATIONS: (This note is used to comply with regulatory documentation for home oxygen)  Patient Saturations on Room Air at Rest = 90 %  Patient Saturations on Room Air while Ambulating = 80%  Patient Saturations on 4 Liters of oxygen while Ambulating = 88%  Please briefly explain why patient needs home oxygen:  Patient oxygen level dropped to into low 80's while walking around in room with oxygen saturations continued to drop to 79 with oxygen via nasal cannula @ 5 liters before sitting back onto bed to rest. At rest oxygen level back up to 96% on 4 Liters.

## 2015-07-06 NOTE — Progress Notes (Signed)
ANTICOAGULATION CONSULT NOTE - Follow Up Consult  Pharmacy Consult for heparin Indication: new  pulmonary embolus  No Known Allergies  Patient Measurements: Height: 5\' 8"  (172.7 cm) Weight: 206 lb 5.6 oz (93.6 kg) IBW/kg (Calculated) : 68.4 Heparin Dosing Weight: 83 kg  Vital Signs: Temp: 97.2 F (36.2 C) (09/25 0000) Temp Source: Oral (09/24 1854) BP: 119/68 mmHg (09/25 0000) Pulse Rate: 80 (09/25 0100)  Labs:  Recent Labs  07/04/15 1100 07/04/15 1103 07/04/15 1337  07/04/15 1903 07/05/15 0135 07/05/15 1549 07/06/15 0340  HGB 10.0* 10.9*  --   --   --  9.3*  --  9.1*  HCT 29.8* 32.0*  --   --   --  28.2*  --  27.9*  PLT 200  --   --   --   --  176  --  168  LABPROT 14.4  --   --   --   --   --   --   --   INR 1.10  --   --   --   --   --   --   --   HEPARINUNFRC  --   --   --   < > 0.58 0.53 0.51 0.34  CREATININE 1.44* 1.30*  --   --   --  1.40*  --   --   TROPONINI  --   --  0.08*  --  0.08* 0.06*  --   --   < > = values in this interval not displayed. Estimated Creatinine Clearance: 55.3 mL/min (by C-G formula based on Cr of 1.4).  Medical History: Past Medical History  Diagnosis Date  . Hypertension   . Osteoarthritis   . Retroperitoneal lymphadenopathy   . History of pulmonary embolus (PE)     dec 2009  . History of DVT (deep vein thrombosis)     dec 2009  . OSA on CPAP   . History of acute renal failure     W/ SEPSIS  MARCH 2014  . Urgency of urination   . ED (erectile dysfunction)   . Left renal mass     MONITORED BY UROLOGIST-  DR Diona Fanti  . Hydronephrosis, right   . Prostate cancer ONCOLOGIST-  DR Alen Blew    DX 2001 (T3 N1)-- S/P PROSTATECTOMY--/ BIOCHEMICAL RELAPSE, RECEIVED PROSTATIC BED RADIATION AND LUPRON WITH CASODEX   SINCE DEVELOPED  CASTATION RESISITENT METASTATIC PROSTATE ADENOCARINOMA--  CURRENT THERAPY CHEMOTHEAPY EVERY 3 WEEKS AND LUPRON INJECTIONS EVERY 4 MONTHS  . Fatigue   . Chemotherapy-induced nausea   . Metastasis to bone      PRIMARY PROSTATE CANCER   Assessment: Patient's a 69 y.o M with hx LE VTE 2009 after orthopedic procedure (patient does not recall oral anti-coagulant) and metastatic prostate cancer on Lynparza and lupron PTA.  He presented to the ED on 9/23 with c/o SOB.  Chest CTA showed bilateral large acute PE with right heart strain.  To start heparin for new PE.  Heparin level = 0.34 with heparin infusing @ 1400 units/hr   No complications of therapy noted    Goal of Therapy:  Heparin level 0.3-0.7 units/ml Monitor platelets by anticoagulation protocol: Yes   Plan:  - Continue IV heparin @ 1400 units/hr - Follow heparin level daily & daily CBC - F/U plans for long-term anticoagulation   Leone Haven, PharmD  07/06/2015, 4:36 AM

## 2015-07-06 NOTE — Progress Notes (Signed)
PROGRESS NOTE    Rudene Re. DXA:[id number] DOB: 02-10-1946 DOA: 07/04/2015 PCP: Simona Huh, MD  Oncology: Dr. Roxy Cedar Shadad/Dr. 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 He has been on this medication and tolerated it well. His PSA is slightly higher up to 1519. Despite the slight rise in his PSA, and has overall stabilized since July 2016, Status post Port-A-Cath, hydronephrosis status post stent, on Lupron treatment, left renal mass likely renal cell carcinoma, OSA on nightly CPAP, seen by his oncologist on 9/23 prior history of DVT/PE in 2009, presented to the [facility] ED on 07/04/15 with 1 week history of dyspnea on exertion. CTA chest showed submassive acute PE with right ventricular strain. Admitted to stepdown unit. CCM consulted-no thrombolytics indicated. Started on IV heparin. Oncology consulted regarding choice of long-term anticoagulation-Lovenox versus NOAC Vs Coumadin.   Assessment/Plan:  Acute submassive pulmonary embolism with right heart strain - Patient has prior history of PE/DVT in 2009 following orthopedic surgery. He cannot recollect which anticoagulant he was on at that time - Hemodynamically stable on admission. Admitted to stepdown unit. CCM consulted-not a candidate for thrombolytic treatment. Started on IV heparin drip. - 2-D echo results as below. - Treated with approximately 48 hours with IV heparin. Remained hemodynamically stable. Transition to full dose Lovenox on 9/25 as per oncology recommendation  Acute respiratory failure with hypoxia - Secondary to pulmonary embolism. Titrate oxygen to maintain saturations greater than 90%. Reassess oxygen needs prior to discharge. Incentive  spirometry.  Essential hypertension - Tightly controlled blood pressures off of antihypertensives. Continue to monitor without meds for now  Chronic anemia - Stable.   Stage II chronic kidney disease - Baseline creatinine 1.4. Stable  Elevated troponin - Secondary to demand ischemia from acute submassive PE. Flat trend. 2-D echo with normal EF and no wall motion abnormalities. No further workup. No chest pain at this time.  Castrate resistant metastatic prostate cancer - Continue Lynparza- olaparib  - Outpatient follow-up with oncology.  Chronic bilateral lower extremity edema - Reduced IV fluids  OSA - Continue nightly CPAP   DVT prophylaxis: On full dose Lovenox. Code Status:  Full Family Communication:  None at bedside. Left message for spouse on home and cell phone on 9/24.  Disposition Plan:  transfer to telemetry. DC home in 1-2 days.    Consultants:   CCM-signed off 9/25   medical oncology  Procedures:   2-D echo 07/04/15: Study Conclusions  - Left ventricle: The cavity size was normal. There was mild concentric hypertrophy. Systolic function was vigorous. The estimated ejection fraction was in the range of 65% to 70%. Wall motion was normal; there were no regional wall motion abnormalities. Doppler parameters are consistent with abnormal left ventricular relaxation (grade 1 diastolic dysfunction). There was no evidence of elevated ventricular filling pressure by Doppler parameters. - Ventricular septum: The contour showed diastolic flattening and systolic flattening. - Aortic valve: Trileaflet; normal thickness leaflets. There was trivial regurgitation. - Aortic root: The aortic root was normal in size. - Mitral valve: Structurally normal valve. There was no regurgitation. - Left atrium: The atrium was normal in size. - Right ventricle: The cavity size was severely dilated. Wall thickness was normal. Systolic function was  moderately reduced. - Right atrium: The atrium was mildly dilated. - Tricuspid valve: There  was moderate regurgitation. - Pulmonic valve: Structurally normal valve. There was mild regurgitation. - Pulmonary arteries: Systolic pressure was moderately to severely increased. PA peak pressure: 53 mm Hg (S). - Inferior vena cava: The vessel was dilated. The respirophasic diameter changes were blunted (< 50%), consistent with elevated central venous pressure.  Impressions:  - Severely dilated RV with moderately decreased systolic function and McConnell sign consistent with pulmonary embolism. Moderate to severe pulmonary hypertension.   Antibiotics:   None   Subjective:  Feels better. Denies dyspnea at rest. No chest pain reported.  Objective: Filed Vitals:   07/06/15 0600 07/06/15 0700 07/06/15 0800 07/06/15 1200  BP: 107/73     Pulse: 75     Temp:   97.8 F (36.6 C) 97.6 F (36.4 C)  TempSrc:   Axillary Oral  Resp: 27     Height:      Weight:      SpO2: 100% 99%      Intake/Output Summary (Last 24 hours) at 07/06/15 1218 Last data filed at 07/06/15 0955  Gross per 24 hour  Intake 1366.33 ml  Output    675 ml  Net 691.33 ml   Filed Weights   07/04/15 1242 07/04/15 1700 07/05/15 0400  Weight: 86.183 kg (190 lb) 91.8 kg (202 lb 6.1 oz) 93.6 kg (206 lb 5.6 oz)     Exam:  General exam:  Pleasant middle-aged male lying comfortably propped up in bed. Had CPAP on this morning  Respiratory system: diminished breath sounds in the bases otherwise clear to auscultation. No increased work of breathing. Cardiovascular system: S1 & S2 heard, RRR. No JVD, murmurs, gallops. Trace bilateral leg edema. Telemetry: Sinus rhythm Gastrointestinal system: Abdomen is nondistended, soft and nontender. Normal bowel sounds heard. Central nervous system: Alert and oriented. No focal neurological deficits. Extremities: Symmetric 5 x 5 power.   Data Reviewed: Basic Metabolic  Panel:  Recent Labs Lab 07/01/15 0905 07/04/15 1100 07/04/15 1103 07/04/15 1337 07/05/15 0135  NA 143 140 141  --  142  K 3.6 3.7 3.7  --  4.0  CL  --  107 105  --  110  CO2 25 24  --   --  24  GLUCOSE 129 137* 136*  --  113*  BUN 15.2 20 22*  --  19  CREATININE 1.3 1.44* 1.30*  --  1.40*  CALCIUM 8.8 8.8*  --   --  8.1*  MG  --   --   --  1.9  --    Liver Function Tests:  Recent Labs Lab 07/01/15 0905 07/04/15 1100 07/04/15 1337 07/05/15 0135  AST 14 32 25 22  ALT 10 22 21 21   ALKPHOS 38* 44 41 38  BILITOT 0.44 0.3 0.2* 0.4  PROT 6.5 7.0 6.6 6.3*  ALBUMIN 3.5 3.8 3.7 3.4*   No results for input(s): LIPASE, AMYLASE in the last 168 hours. No results for input(s): AMMONIA in the last 168 hours. CBC:  Recent Labs Lab 07/01/15 0904 07/04/15 1100 07/04/15 1103 07/05/15 0135 07/06/15 0340  WBC 4.8 5.9  --  5.3 4.5  NEUTROABS 3.3 4.3  --   --   --   HGB 9.1* 10.0* 10.9* 9.3* 9.1*  HCT 28.1* 29.8* 32.0* 28.2* 27.9*  MCV 82.6 82.5  --  83.4 83.8  PLT 145 200  --  176 168   Cardiac Enzymes:  Recent Labs Lab 07/04/15 1337 07/04/15 1903 07/05/15 0135  TROPONINI 0.08* 0.08* 0.06*   BNP (  last 3 results) No results for input(s): PROBNP in the last 8760 hours. CBG: No results for input(s): GLUCAP in the last 168 hours.  Recent Results (from the past 240 hour(s))  TECHNOLOGIST REVIEW     Status: None   Collection Time: 07/01/15  9:04 AM  Result Value Ref Range Status   Technologist Review   Final    Sl poly, Mod ovalos, Few Schistocytes, helmet, target, and burr cells  MRSA PCR Screening     Status: None   Collection Time: 07/04/15  3:53 PM  Result Value Ref Range Status   MRSA by PCR NEGATIVE NEGATIVE Final    Comment:        The GeneXpert MRSA Assay (FDA approved for NASAL specimens only), is one component of a comprehensive MRSA colonization surveillance program. It is not intended to diagnose MRSA infection nor to guide or monitor treatment  for MRSA infections.       Studies: No results found.      Scheduled Meds: . calcium carbonate  1,250 mg Oral BID  . enoxaparin (LOVENOX) injection  90 mg Subcutaneous Q12H  . feeding supplement (ENSURE ENLIVE)  237 mL Oral TID BM  . loratadine  10 mg Oral Daily  . mirabegron ER  50 mg Oral Daily  . olaparib  400 mg Oral BID  . sodium chloride  3 mL Intravenous Q12H   Continuous Infusions:    Principal Problem:   Pulmonary embolism Active Problems:   Prostate cancer   Hypertension   Acute pulmonary embolism    Time spent:  58 minutes    HONGALGI,ANAND, MD, FACP, FHM. [facility] Pager [phone]  If 7PM-7AM, please contact night-coverage [email or web address] Password [email or web address] 07/06/2015, 12:18 PM    LOS: 2 days

## 2015-07-06 NOTE — Progress Notes (Signed)
Placed patient on BIPAP for the night.  Patient is tolerating well at this time. 

## 2015-07-07 NOTE — Progress Notes (Signed)
Received report from Dana RN. Resumed care of patient. Agree with previous assessment. Will continue to monitor. 

## 2015-07-07 NOTE — Progress Notes (Signed)
PROGRESS NOTE    [name]. RWE:[id number] DOB: 07-31-1946 DOA: 07/04/2015 PCP: Simona Huh, MD  Oncology: Dr. Roxy Cedar Shadad/Dr. 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 He has been on this medication and tolerated it well. His PSA is slightly higher up to 1519. Despite the slight rise in his PSA, and has overall stabilized since July 2016, Status post Port-A-Cath, hydronephrosis status post stent, on Lupron treatment, left renal mass likely renal cell carcinoma, OSA on nightly CPAP, seen by his oncologist on 9/23 prior history of DVT/PE in 2009, presented to the [facility] ED on 07/04/15 with 1 week history of dyspnea on exertion. CTA chest showed submassive acute PE with right ventricular strain. Admitted to stepdown unit. CCM consulted-no thrombolytics indicated. Started on IV heparin. Oncology consulted regarding choice of long-term anticoagulation-Lovenox versus NOAC Vs Coumadin.   Assessment/Plan:  Acute submassive pulmonary embolism with right heart strain - Patient has prior history of PE/DVT in 2009 following orthopedic surgery. He cannot recollect which anticoagulant he was on at that time - Hemodynamically stable on admission. Admitted to stepdown unit. CCM consulted-not a candidate for thrombolytic treatment. Started on IV heparin drip. - 2-D echo results as below. - Treated with approximately 48 hours with IV heparin. Remained hemodynamically stable. Transitioned to full dose Lovenox on 9/25 as per oncology recommendation - On 9/25, ambulated in stepdown unit and with minimal activity was significantly dyspneic and hypoxic-improved after rest. - Ambulate again to reassess oxygenation and for degree of dyspnea.  Acute  respiratory failure with hypoxia - Secondary to pulmonary embolism. Titrate oxygen to maintain saturations greater than 90%. Reassess oxygen needs prior to discharge. Incentive spirometry. - On 9/25, ambulated in stepdown unit and with minimal activity was significantly dyspneic and hypoxic-improved after rest. - Ambulate again to reassess oxygenation and for degree of dyspnea.  Essential hypertension - controlled blood pressures off of antihypertensives. Continue to monitor without meds for now  Chronic anemia - Stable.   Stage II chronic kidney disease - Baseline creatinine 1.4. Stable  Elevated troponin - Secondary to demand ischemia from acute submassive PE. Flat trend. 2-D echo with normal EF and no wall motion abnormalities. No further workup. No chest pain at this time.  Castrate resistant metastatic prostate cancer - Continue Lynparza- olaparib  - Outpatient follow-up with oncology.  Chronic bilateral lower extremity edema - Reduced IV fluids  OSA - Continue nightly CPAP   DVT prophylaxis: On full dose Lovenox. Code Status:  Full Family Communication:  None at bedside. Left message for spouse on home and cell phone on 9/24.  Disposition Plan:  transfer to telemetry. DC home, possibly 9/27   Consultants:  CCM-signed off 9/25   Medical oncology  Procedures:   2-D echo 07/04/15: Study Conclusions  - Left ventricle: The cavity size was normal. There was mild concentric hypertrophy. Systolic function was vigorous. The estimated ejection fraction was in the range of 65% to 70%. Wall motion was normal; there were no regional wall motion abnormalities. Doppler parameters are consistent with abnormal left ventricular relaxation (grade 1 diastolic dysfunction). There was no evidence of elevated ventricular filling pressure by Doppler parameters. - Ventricular septum: The contour showed diastolic flattening and systolic flattening. - Aortic valve:  Trileaflet; normal thickness leaflets. There was trivial regurgitation. - Aortic root: The aortic  root was normal in size. - Mitral valve: Structurally normal valve. There was no regurgitation. - Left atrium: The atrium was normal in size. - Right ventricle: The cavity size was severely dilated. Wall thickness was normal. Systolic function was moderately reduced. - Right atrium: The atrium was mildly dilated. - Tricuspid valve: There was moderate regurgitation. - Pulmonic valve: Structurally normal valve. There was mild regurgitation. - Pulmonary arteries: Systolic pressure was moderately to severely increased. PA peak pressure: 53 mm Hg (S). - Inferior vena cava: The vessel was dilated. The respirophasic diameter changes were blunted (< 50%), consistent with elevated central venous pressure.  Impressions:  - Severely dilated RV with moderately decreased systolic function and McConnell sign consistent with pulmonary embolism. Moderate to severe pulmonary hypertension.   Antibiotics:   None   Subjective: Denies complaints this morning.  Objective: Filed Vitals:   07/06/15 1710 07/06/15 2224 07/06/15 2315 07/07/15 0400  BP: 120/73 116/71  114/68  Pulse: 84 85 73 74  Temp: 97.4 F (36.3 C) 98.3 F (36.8 C)  97.9 F (36.6 C)  TempSrc: Oral Oral  Oral  Resp: 22 20 18 20   Height:      Weight:      SpO2: 95% 96% 99% 98%    Intake/Output Summary (Last 24 hours) at 07/07/15 1423 Last data filed at 07/07/15 0405  Gross per 24 hour  Intake      0 ml  Output    250 ml  Net   -250 ml   Filed Weights   07/04/15 1242 07/04/15 1700 07/05/15 0400  Weight: 86.183 kg (190 lb) 91.8 kg (202 lb 6.1 oz) 93.6 kg (206 lb 5.6 oz)     Exam:  General exam:  Pleasant middle-aged male sitting up comfortably in bed this morning. Respiratory system: diminished breath sounds in the bases otherwise clear to auscultation. No increased work of breathing. Cardiovascular  system: S1 & S2 heard, RRR. No JVD, murmurs, gallops. Trace bilateral leg edema. Telemetry: Sinus rhythm Gastrointestinal system: Abdomen is nondistended, soft and nontender. Normal bowel sounds heard. Central nervous system: Alert and oriented. No focal neurological deficits. Extremities: Symmetric 5 x 5 power.   Data Reviewed: Basic Metabolic Panel:  Recent Labs Lab 07/01/15 0905 07/04/15 1100 07/04/15 1103 07/04/15 1337 07/05/15 0135  NA 143 140 141  --  142  K 3.6 3.7 3.7  --  4.0  CL  --  107 105  --  110  CO2 25 24  --   --  24  GLUCOSE 129 137* 136*  --  113*  BUN 15.2 20 22*  --  19  CREATININE 1.3 1.44* 1.30*  --  1.40*  CALCIUM 8.8 8.8*  --   --  8.1*  MG  --   --   --  1.9  --    Liver Function Tests:  Recent Labs Lab 07/01/15 0905 07/04/15 1100 07/04/15 1337 07/05/15 0135  AST 14 32 25 22  ALT 10 22 21 21   ALKPHOS 38* 44 41 38  BILITOT 0.44 0.3 0.2* 0.4  PROT 6.5 7.0 6.6 6.3*  ALBUMIN 3.5 3.8 3.7 3.4*   No results for input(s): LIPASE, AMYLASE in the last 168 hours. No results for input(s): AMMONIA in the last 168 hours. CBC:  Recent Labs Lab 07/01/15 0904 07/04/15 1100 07/04/15 1103 07/05/15 0135 07/06/15 0340  WBC 4.8 5.9  --  5.3 4.5  NEUTROABS 3.3 4.3  --   --   --   HGB  9.1* 10.0* 10.9* 9.3* 9.1*  HCT 28.1* 29.8* 32.0* 28.2* 27.9*  MCV 82.6 82.5  --  83.4 83.8  PLT 145 200  --  176 168   Cardiac Enzymes:  Recent Labs Lab 07/04/15 1337 07/04/15 1903 07/05/15 0135  TROPONINI 0.08* 0.08* 0.06*   BNP (last 3 results) No results for input(s): PROBNP in the last 8760 hours. CBG: No results for input(s): GLUCAP in the last 168 hours.  Recent Results (from the past 240 hour(s))  TECHNOLOGIST REVIEW     Status: None   Collection Time: 07/01/15  9:04 AM  Result Value Ref Range Status   Technologist Review   Final    Sl poly, Mod ovalos, Few Schistocytes, helmet, target, and burr cells  MRSA PCR Screening     Status: None    Collection Time: 07/04/15  3:53 PM  Result Value Ref Range Status   MRSA by PCR NEGATIVE NEGATIVE Final    Comment:        The GeneXpert MRSA Assay (FDA approved for NASAL specimens only), is one component of a comprehensive MRSA colonization surveillance program. It is not intended to diagnose MRSA infection nor to guide or monitor treatment for MRSA infections.       Studies: No results found.      Scheduled Meds: . calcium carbonate  1,250 mg Oral BID  . enoxaparin (LOVENOX) injection  90 mg Subcutaneous Q12H  . feeding supplement (ENSURE ENLIVE)  237 mL Oral TID BM  . loratadine  10 mg Oral Daily  . mirabegron ER  50 mg Oral Daily  . olaparib  400 mg Oral BID  . sodium chloride  3 mL Intravenous Q12H   Continuous Infusions:    Principal Problem:   Pulmonary embolism Active Problems:   Prostate cancer   Hypertension   Acute pulmonary embolism   Acute respiratory failure with hypoxia    Time spent:  20 minutes    HONGALGI,ANAND, MD, FACP, FHM. [facility] Pager [phone]  If 7PM-7AM, please contact night-coverage [email or web address] Password [email or web address] 07/07/2015, 2:23 PM    LOS: 3 days

## 2015-07-07 NOTE — Care Management Important Message (Signed)
Important Message  Patient Details  Name: [name]. MRN: [id number] Date of Birth: May 29, 1946   Medicare Important Message Given:  [facility] notification given    Camillo Flaming 07/07/2015, 12:24 [place] Message  Patient Details  Name: [name]. MRN: [id number] Date of Birth: 05-09-1946   Medicare Important Message Given:  Yes-second notification given    Camillo Flaming 07/07/2015, 12:23 PM

## 2015-07-08 LAB — CBC
HCT: 25.5 % — ABNORMAL LOW (ref 39.0–52.0)
HCT: 28.1 % — ABNORMAL LOW (ref 39.0–52.0)
Hemoglobin: 8.2 g/dL — ABNORMAL LOW (ref 13.0–17.0)
Hemoglobin: 9.1 g/dL — ABNORMAL LOW (ref 13.0–17.0)
MCH: 27.2 pg (ref 26.0–34.0)
MCH: 27.3 pg (ref 26.0–34.0)
MCHC: 32.2 g/dL (ref 30.0–36.0)
MCHC: 32.4 g/dL (ref 30.0–36.0)
MCV: 84.4 fL (ref 78.0–100.0)
MCV: 84.4 fL (ref 78.0–100.0)
PLATELETS: 156 10*3/uL (ref 150–400)
PLATELETS: 164 10*3/uL (ref 150–400)
RBC: 3.02 MIL/uL — ABNORMAL LOW (ref 4.22–5.81)
RBC: 3.33 MIL/uL — ABNORMAL LOW (ref 4.22–5.81)
RDW: 24.9 % — ABNORMAL HIGH (ref 11.5–15.5)
RDW: 25 % — AB (ref 11.5–15.5)
WBC: 4.3 10*3/uL (ref 4.0–10.5)
WBC: 4.8 10*3/uL (ref 4.0–10.5)

## 2015-07-08 MED ORDER — HEPARIN SOD (PORK) LOCK FLUSH 100 UNIT/ML IV SOLN
500.0000 [IU] | INTRAVENOUS | Status: AC | PRN
  Administered 2015-07-08: 500 [IU]

## 2015-07-08 MED ORDER — ENSURE ENLIVE PO LIQD: 237.0000 mL | Freq: Three times a day (TID) | ORAL | Status: DC

## 2015-07-08 MED ORDER — ENOXAPARIN SODIUM 30 MG/0.3ML ~~LOC~~ SOLN: 90.0000 mg | Freq: Two times a day (BID) | SUBCUTANEOUS | Status: DC

## 2015-07-08 NOTE — Progress Notes (Signed)
Addendum  Prior to patient's discharge from hospital this morning, I discussed with [facility] case management and was advised that patient should not have any difficulty procuring his therapeutic dose Lovenox from outpatient pharmacy ( i.e. issues with preauthorization etc). Post discharge late this evening however I received a call from [facility] outpatient pharmacy, initially to clarify LMWH dose and subsequently from Richardson Landry stating that out of pocket co-pay for patient for a month's supply of full dose LMWH would be approximately $500. Richardson Landry advised that he would provide patient with 4 doses of LMWH while alternate arrangements can be done. I consulted after-hours ED case management Ms Jackelyn Poling for assistance and stressed importance that he has to have his LMWH by end of day tomorrow. I have provided my direct contact number so that case management can update me.  Vernell Leep, MD, FACP, FHM. [facility] Pager [phone]  If 7PM-7AM, please contact night-coverage [email or web address] Password [facility] 07/08/2015, 6:31 PM

## 2015-07-08 NOTE — Discharge Instructions (Signed)
Pulmonary Embolism A pulmonary (lung) embolism (PE) is a blood clot that has traveled to the lung and results in a blockage of blood flow in the affected lung. Most clots come from deep veins in the legs or pelvis. PE is a dangerous and potentially life-threatening condition that can be treated if identified. CAUSES Blood clots form in a vein for different reasons. Usually several things cause blood clots. They include:  The flow of blood slows down.  The inside of the vein is damaged in some way.  The person has a condition that makes the blood clot more easily. RISK FACTORS Some people are more likely than others to develop PE. Risk factors include:   Smoking.  Being overweight (obese).  Sitting or lying still for a long time. This includes long-distance travel, paralysis, or recovery from an illness or surgery. Other factors that increase risk are:   Older age, especially over 72 years of age.  Having a family history of blood clots or if you have already had a blood clot.  Having major or lengthy surgery. This is especially true for surgery on the hip, knee, or belly (abdomen). Hip surgery is particularly high risk.  Having a long, thin tube (catheter) placed inside a vein during a medical procedure.  Breaking a hip or leg.  Having cancer or cancer treatment.  Medicines containing the male hormone estrogen. This includes birth control pills and hormone replacement therapy.  Other circulation or heart problems.  Pregnancy and childbirth.  Hormone changes make the blood clot more easily during pregnancy.  The fetus puts pressure on the veins of the pelvis.  There is a risk of injury to veins during delivery or a caesarean delivery. The risk is highest just after childbirth.  PREVENTION   Exercise the legs regularly. Take a brisk 30 minute walk every day.  Maintain a weight that is appropriate for your height.  Avoid sitting or lying in bed for long periods of  time without moving your legs.  Women, particularly those over the age of 28 years, should consider the risks and benefits of taking estrogen medicines, including birth control pills.  Do not smoke, especially if you take estrogen medicines.  Long-distance travel can increase your risk. You should exercise your legs by walking or pumping the muscles every hour.  Many of the risk factors above relate to situations that exist with hospitalization, either for illness, injury, or elective surgery. Prevention may include medical and nonmedical measures.   Your health care provider will assess you for the need for venous thromboembolism prevention when you are admitted to the hospital. If you are having surgery, your surgeon will assess you the day of or day after surgery.  SYMPTOMS  The symptoms of a PE usually start suddenly and include:  Shortness of breath.  Coughing.  Coughing up blood or blood-tinged mucus.  Chest pain. Pain is often worse with deep breaths.  Rapid heartbeat. DIAGNOSIS  If a PE is suspected, your health care provider will take a medical history and perform a physical exam. Other tests that may be required include:  Blood tests, such as studies of the clotting properties of your blood.  Imaging tests, such as ultrasound, CT, MRI, and other tests to see if you have clots in your legs or lungs.  An electrocardiogram. This can look for heart strain from blood clots in the lungs. TREATMENT   The most common treatment for a PE is blood thinning (anticoagulant) medicine, which reduces  the blood's tendency to clot. Anticoagulants can stop new blood clots from forming and old clots from growing. They cannot dissolve existing clots. Your body does this by itself over time. Anticoagulants can be given by mouth, through an intravenous (IV) tube, or by injection. Your health care provider will determine the best program for you.  Less commonly, clot-dissolving medicines  (thrombolytics) are used to dissolve a PE. They carry a high risk of bleeding, so they are used mainly in severe cases.  Very rarely, a blood clot in the leg needs to be removed surgically.  If you are unable to take anticoagulants, your health care provider may arrange for you to have a filter placed in a main vein in your abdomen. This filter prevents clots from traveling to your lungs. HOME CARE INSTRUCTIONS   Take all medicines as directed by your health care provider.  Learn as much as you can about DVT.  Wear a medical alert bracelet or carry a medical alert card.  Ask your health care provider how soon you can go back to normal activities. It is important to stay active to prevent blood clots. If you are on anticoagulant medicine, avoid contact sports.  It is very important to exercise. This is especially important while traveling, sitting, or standing for long periods of time. Exercise your legs by walking or by tightening and relaxing your leg muscles regularly. Take frequent walks.  You may need to wear compression stockings. These are tight elastic stockings that apply pressure to the lower legs. This pressure can help keep the blood in the legs from clotting. Taking Warfarin Warfarin is a daily medicine that is taken by mouth. Your health care provider will advise you on the length of treatment (usually 3-6 months, sometimes lifelong). If you take warfarin:  Understand how to take warfarin and foods that can affect how warfarin works in Veterinary surgeon.  Too much and too little warfarin are both dangerous. Too much warfarin increases the risk of bleeding. Too little warfarin continues to allow the risk for blood clots. Warfarin and Regular Blood Testing While taking warfarin, you will need to have regular blood tests to measure your blood clotting time. These blood tests usually include both the prothrombin time (PT) and international normalized ratio (INR) tests. The PT and INR  results allow your health care provider to adjust your dose of warfarin. It is very important that you have your PT and INR tested as often as directed by your health care provider.  Warfarin and Your Diet Avoid major changes in your diet, or notify your health care provider before changing your diet. Arrange a visit with a registered dietitian to answer your questions. Many foods, especially foods high in vitamin K, can interfere with warfarin and affect the PT and INR results. You should eat a consistent amount of foods high in vitamin K. Foods high in vitamin K include:   Spinach, kale, broccoli, cabbage, collard and turnip greens, Brussels sprouts, peas, cauliflower, seaweed, and parsley.  Beef and pork liver.  Green tea.  Soybean oil. Warfarin with Other Medicines Many medicines can interfere with warfarin and affect the PT and INR results. You must:  Tell your health care provider about any and all medicines, vitamins, and supplements you take, including aspirin and other over-the-counter anti-inflammatory medicines. Be especially cautious with aspirin and anti-inflammatory medicines. Ask your health care provider before taking these.  Do not take or discontinue any prescribed or over-the-counter medicine except on the advice  of your health care provider or pharmacist. Warfarin Side Effects Warfarin can have side effects, such as easy bruising and difficulty stopping bleeding. Ask your health care provider or pharmacist about other side effects of warfarin. You will need to:  Hold pressure over cuts for longer than usual.  Notify your dentist and other health care providers that you are taking warfarin before you undergo any procedures where bleeding may occur. Warfarin with Alcohol and Tobacco   Drinking alcohol frequently can increase the effect of warfarin, leading to excess bleeding. It is best to avoid alcoholic drinks or consume only very small amounts while taking warfarin.  Notify your health care provider if you change your alcohol intake.  Do not use any tobacco products including cigarettes, chewing tobacco, or electronic cigarettes. If you smoke, quit. Ask your health care provider for help with quitting smoking. Alternative Medicines to Warfarin: Factor Xa Inhibitor Medicines  These blood thinning medicines are taken by mouth, usually for several weeks or longer. It is important to take the medicine every single day, at the same time each day.  There are no regular blood tests required when using these medicines.  There are fewer food and drug interactions than with warfarin.  The side effects of this class of medicine is similar to that of warfarin, including excessive bruising or bleeding. Ask your health care provider or pharmacist about other potential side effects. SEEK MEDICAL CARE IF:   You notice a rapid heartbeat.  You feel weaker or more tired than usual.  You feel faint.  You notice increased bruising.  Your symptoms are not getting better in the time expected.  You are having side effects of medicine. SEEK IMMEDIATE MEDICAL CARE IF:   You have chest pain.  You have trouble breathing.  You have new or increased swelling or pain in one leg.  You cough up blood.  You notice blood in vomit, in a bowel movement, or in urine.  You have a fever. Symptoms of PE may represent a serious problem that is an emergency. Do not wait to see if the symptoms will go away. Get medical help right away. Call your local emergency services (911 in the [place]). Do not drive yourself to the hospital. Document Released: 09/24/2000 Document Revised: 02/11/2014 Document Reviewed: 10/08/2013 [facility] Patient Information 2015 [place], [place]. This information is not intended to replace advice given to you by your health care provider. Make sure you discuss any questions you have with your health care provider.   Enoxaparin injection What is this  medicine? ENOXAPARIN (ee nox a PA rin) is used after knee, hip, or abdominal surgeries to prevent blood clotting. It is also used to treat existing blood clots in the lungs or in the veins. This medicine may be used for other purposes; ask your health care provider or pharmacist if you have questions. COMMON BRAND NAME(S): Lovenox What should I tell my health care provider before I take this medicine? They need to know if you have any of these conditions: -bleeding disorders, hemorrhage, or hemophilia -infection of the heart or heart valves -kidney or liver disease -previous stroke -prosthetic heart valve -recent surgery or delivery of a baby -ulcer in the stomach or intestine, diverticulitis, or other bowel disease -an unusual or allergic reaction to enoxaparin, heparin, pork or pork products, other medicines, foods, dyes, or preservatives -pregnant or trying to get pregnant -breast-feeding How should I use this medicine? This medicine is for injection under the skin. It is  usually given by a health-care professional. You or a family member may be trained on how to give the injections. If you are to give yourself injections, make sure you understand how to use the syringe, measure the dose if necessary, and give the injection. To avoid bruising, do not rub the site where this medicine has been injected. Do not take your medicine more often than directed. Do not stop taking except on the advice of your doctor or health care professional. Make sure you receive a puncture-resistant container to dispose of the needles and syringes once you have finished with them. Do not reuse these items. Return the container to your doctor or health care professional for proper disposal. Talk to your pediatrician regarding the use of this medicine in children. Special care may be needed. Overdosage: If you think you have taken too much of this medicine contact a poison control center or emergency room at  once. NOTE: This medicine is only for you. Do not share this medicine with others. What if I miss a dose? If you miss a dose, take it as soon as you can. If it is almost time for your next dose, take only that dose. Do not take double or extra doses. What may interact with this medicine? -aspirin and aspirin-like medicines -certain medicines that treat or prevent blood clots -dipyridamole -NSAIDs, medicines for pain and inflammation, like ibuprofen or naproxen This list may not describe all possible interactions. Give your health care provider a list of all the medicines, herbs, non-prescription drugs, or dietary supplements you use. Also tell them if you smoke, drink alcohol, or use illegal drugs. Some items may interact with your medicine. What should I watch for while using this medicine? Visit your doctor or health care professional for regular checks on your progress. Your condition will be monitored carefully while you are receiving this medicine. Notify your doctor or health care professional and seek emergency treatment if you develop breathing problems; changes in vision; chest pain; severe, sudden headache; pain, swelling, warmth in the leg; trouble speaking; sudden numbness or weakness of the face, arm, or leg. These can be signs that your condition has gotten worse. If you are going to have surgery, tell your doctor or health care professional that you are taking this medicine. Do not stop taking this medicine without first talking to your doctor. Be sure to refill your prescription before you run out of medicine. Avoid sports and activities that might cause injury while you are using this medicine. Severe falls or injuries can cause unseen bleeding. Be careful when using sharp tools or knives. Consider using an Copy. Take special care brushing or flossing your teeth. Report any injuries, bruising, or red spots on the skin to your doctor or health care professional. What side  effects may I notice from receiving this medicine? Side effects that you should report to your doctor or health care professional as soon as possible: -allergic reactions like skin rash, itching or hives, swelling of the face, lips, or tongue -feeling faint or lightheaded, falls -signs and symptoms of bleeding such as bloody or black, tarry stools; red or dark-brown urine; spitting up blood or brown material that looks like coffee grounds; red spots on the skin; unusual bruising or bleeding from the eye, gums, or nose Side effects that usually do not require medical attention (report to your doctor or health care professional if they continue or are bothersome): -pain, redness, or irritation at site where injected This  list may not describe all possible side effects. Call your doctor for medical advice about side effects. You may report side effects to FDA at 1-800-FDA-1088. Where should I keep my medicine? Keep out of the reach of children. Store at room temperature between 15 and 30 degrees C (59 and 86 degrees F). Do not freeze. If your injections have been specially prepared, you may need to store them in the refrigerator. Ask your pharmacist. Throw away any unused medicine after the expiration date. NOTE: This sheet is a summary. It may not cover all possible information. If you have questions about this medicine, talk to your doctor, pharmacist, or health care provider.  2015, [employer]. (2014-01-29 16:06:21)

## 2015-07-08 NOTE — Discharge Summary (Signed)
Physician Discharge Summary  Rudene Re. PRF:[id number] DOB: 12-26-1945 DOA: 07/04/2015  PCP: Simona Huh, MD  Oncology: Dr. Roxy Cedar Shadad/Dr. Bitting (at [facility])  [place] date: 07/04/2015 Discharge date: 07/08/2015  Time spent: Greater than 30 minutes  Recommendations for Outpatient Follow-up:  1. Dr. Gaynelle Arabian, PCP in 5-7 days with repeat labs (CBC & BMP). 2. Dr. Dorthula Matas, Oncology at [facility] 3. Dr. Zola Button, Oncology  Discharge Diagnoses:  Principal Problem:   Pulmonary embolism Active Problems:   Prostate cancer   Hypertension   Acute pulmonary embolism   Acute respiratory failure with hypoxia   Discharge Condition: Improved & Stable  Diet recommendation: Heart healthy diet.  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 He has been on this medication and tolerated it well. His PSA is slightly higher up to 1519. Despite the slight rise in his PSA, and has overall stabilized since July 2016, Status post Port-A-Cath, hydronephrosis status post stent, on Lupron treatment, left renal mass likely renal cell carcinoma, OSA on nightly CPAP, seen by his oncologist on 9/23 prior history of DVT/PE in 2009, presented to the [facility] ED on 07/04/15 with 1 week history of dyspnea on exertion. CTA chest showed submassive acute PE with right ventricular strain. Admitted to stepdown unit.   Hospital Course:   Acute submassive pulmonary embolism with right heart strain - Patient has prior history of PE/DVT in 2009 following orthopedic surgery. He cannot recollect which anticoagulant  he was on at that time - Hemodynamically stable on admission. Admitted to stepdown unit. CCM consulted-not a candidate for thrombolytic treatment. Started initially on IV heparin drip. - 2-D echo results as below. - Treated with approximately 48 hours with IV heparin. Remained hemodynamically stable. Transitioned to full dose Lovenox on 9/25 as per oncology recommendation - On 9/25, ambulated in stepdown unit and with minimal activity & was significantly dyspneic and hypoxic-improved after rest. - Today patient ambulated on room air in the hallway and oxygen saturations remained >95%. Patient continues to feel better.  Acute respiratory failure with hypoxia - Secondary to pulmonary embolism. - Gradually improved and hypoxia has resolved.  Essential hypertension - controlled blood pressures off of antihypertensives. Continue to monitor without meds for now  Chronic anemia - Stable in the 9 g per DL range. Follow CBCs as outpatient.  Stage II chronic kidney disease - Baseline creatinine 1.4. Stable. Holding thiazide and ARB diuretics. Follow BMP in a few days as outpatient.  Elevated troponin - Secondary to demand ischemia from acute submassive PE. Flat trend. 2-D echo with normal EF and no wall motion abnormalities. No further workup. No chest pain. Aspirin discontinued to reduce bleeding risk while on full dose anticoagulation with Lovenox.  Castrate resistant metastatic prostate cancer - Continue Lynparza- olaparib  - Outpatient follow-up with oncology.  Chronic bilateral lower extremity edema - Reduced IV fluids  OSA - Continue nightly CPAP   Discussed extensively with patient's spouse at bedside today.   Consultants:  CCM-signed off 9/25   Medical oncology  Procedures:  2-D echo 07/04/15: Study Conclusions  - Left ventricle: The cavity size was normal. There  was mild concentric hypertrophy. Systolic function was vigorous. The estimated ejection fraction was  in the range of 65% to 70%. Wall motion was normal; there were no regional wall motion abnormalities. Doppler parameters are consistent with abnormal left ventricular relaxation (grade 1 diastolic dysfunction). There was no evidence of elevated ventricular filling pressure by Doppler parameters. - Ventricular septum: The contour showed diastolic flattening and systolic flattening. - Aortic valve: Trileaflet; normal thickness leaflets. There was trivial regurgitation. - Aortic root: The aortic root was normal in size. - Mitral valve: Structurally normal valve. There was no regurgitation. - Left atrium: The atrium was normal in size. - Right ventricle: The cavity size was severely dilated. Wall thickness was normal. Systolic function was moderately reduced. - Right atrium: The atrium was mildly dilated. - Tricuspid valve: There was moderate regurgitation. - Pulmonic valve: Structurally normal valve. There was mild regurgitation. - Pulmonary arteries: Systolic pressure was moderately to severely increased. PA peak pressure: 53 mm Hg (S). - Inferior vena cava: The vessel was dilated. The respirophasic diameter changes were blunted (< 50%), consistent with elevated central venous pressure.  Impressions:  - Severely dilated RV with moderately decreased systolic function and McConnell sign consistent with pulmonary embolism. Moderate to severe pulmonary hypertension.  Discharge Exam:  Complaints: Continues to feel better. Ambulated halls independently on room air and maintained saturations >95%. Dyspnea continues to improve. Denies any other complaints.  Filed Vitals:   07/07/15 2105 07/07/15 2202 07/08/15 0418 07/08/15 0835  BP: 113/72  110/67   Pulse: 80 74 70   Temp: 98.3 F (36.8 C)  97.5 F (36.4 C)   TempSrc: Oral  Axillary   Resp: 18 18 18    Height:      Weight:    96.934 kg (213 lb 11.2 oz)  SpO2: 100% 99% 100%     General exam:  Pleasant middle-aged male sitting up comfortably in bed this morning. Respiratory system: diminished breath sounds in the bases otherwise clear to auscultation. No increased work of breathing. Cardiovascular system: S1 & S2 heard, RRR. No JVD, murmurs, gallops. Trace bilateral leg edema. Telemetry: Sinus rhythm Gastrointestinal system: Abdomen is nondistended, soft and nontender. Normal bowel sounds heard. Central nervous system: Alert and oriented. No focal neurological deficits. Extremities: Symmetric 5 x 5 power.  Discharge Instructions      Discharge Instructions    Call MD for:  difficulty breathing, headache or visual disturbances    Complete by:  As directed      Call MD for:  extreme fatigue    Complete by:  As directed      Call MD for:  persistant dizziness or light-headedness    Complete by:  As directed      Call MD for:  persistant nausea and vomiting    Complete by:  As directed      Call MD for:  severe uncontrolled pain    Complete by:  As directed      Call MD for:  temperature >100.4    Complete by:  As directed      Diet - low sodium heart healthy    Complete by:  As directed      Increase activity slowly    Complete by:  As directed             Medication List    STOP taking these medications        aspirin EC 81 MG tablet     losartan-hydrochlorothiazide 50-12.5 MG tablet  Commonly known as:  HYZAAR      TAKE these medications        calcium carbonate 1250 (500 CA) MG chewable tablet  Commonly known as:  OS-CAL  Chew 1 tablet by mouth 2 (two) times daily.     enoxaparin 30 MG/0.3ML injection  Commonly known as:  LOVENOX  Inject 0.9 mLs (90 mg total) into the skin every 12 (twelve) hours.     feeding supplement (ENSURE ENLIVE) Liqd  Take 237 mLs by mouth 3 (three) times daily between meals.     leuprolide 30 MG injection  Commonly known as:  LUPRON  Inject 30 mg into the muscle every 4 (four) months. LAST INJECTION JAN 2016      lidocaine-prilocaine cream  Commonly known as:  EMLA  Apply topically as needed.     loratadine 10 MG tablet  Commonly known as:  CLARITIN  Take 10 mg by mouth daily.     LYNPARZA 50 MG capsule  Generic drug:  olaparib  Take 400 mg by mouth 2 (two) times daily. Swallow whole.     MYRBETRIQ 50 MG Tb24 tablet  Generic drug:  mirabegron ER  Take 50 mg by mouth daily.     ondansetron 8 MG tablet  Commonly known as:  ZOFRAN  Take 1 tablet (8 mg total) by mouth every 8 (eight) hours as needed for nausea or vomiting.     oxyCODONE 5 MG immediate release tablet  Commonly known as:  Oxy IR/ROXICODONE  Take 1 tablet (5 mg total) by mouth every 4 (four) hours as needed for severe pain.       Follow-up Information    Follow up with Simona Huh, MD. Schedule an appointment as soon as possible for a visit in 5 days.   Specialty:  Family Medicine   Why:  To be seen with repeat labs (CBC & BMP).   Contact information:   [address] [place] [address] [phone]       Schedule an appointment as soon as possible for a visit with Lawerance Sabal, MD.   Specialty:  Hematology and Oncology   Contact information:   [place] [address] [phone]       Schedule an appointment as soon as possible for a visit with [facility], MD.   Specialty:  Oncology   Contact information:   [place]. [place] [address] [phone]        The results of significant diagnostics from this hospitalization (including imaging, microbiology, ancillary and laboratory) are listed below for reference.    Significant Diagnostic Studies: Dg Chest 2 View  07/02/2015   CLINICAL DATA:  Shortness of breath.  Prostate cancer.  EXAM: CHEST  2 VIEW  COMPARISON:  CT 06/04/2014 .  FINDINGS: PowerPort catheter noted with tip projected over the superior vena cava. Mediastinum is stable. Right hilar fullness noted. Mild adenopathy cannot be excluded.  Cardiomegaly with the mild pulmonary vascular congestion. No evidence of overt congestive heart failure. No focal infiltrate. No pleural effusion or pneumothorax. No acute bony abnormality. Degenerative change thoracic spine. Right ureteral stent noted .  IMPRESSION: 1. Power port catheter in good anatomic position. 2. Mild right hilar fullness, adenopathy cannot be excluded. 3. Cardiomegaly. Mild pulmonary venous congestion. No evidence of overt congestive heart failure .   Electronically Signed   By: [place]   On: 07/02/2015 11:30   Ct Angio Chest Pe W/cm &/or Wo  Cm  07/04/2015   CLINICAL DATA:  Shortness of breath.  EXAM: CT ANGIOGRAPHY CHEST WITH CONTRAST  TECHNIQUE: Multidetector CT imaging of the chest was performed using the standard protocol during bolus administration of intravenous contrast. Multiplanar CT image reconstructions and MIPs were obtained to evaluate the vascular anatomy.  CONTRAST:  76mL OMNIPAQUE IOHEXOL 350 MG/ML SOLN  COMPARISON:  CT scan of January 22, 2014.  FINDINGS: No pneumothorax is noted. Minimal bilateral pleural effusions are noted with adjacent subsegmental atelectasis. 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 There is no evidence of thoracic aortic dissection or aneurysm. Large bilateral pulmonary emboli are noted. RV/LV ratio greater than 2 is noted suggesting right heart strain. Right internal jugular Port-A-Cath is noted with distal tip at expected position of the cavoatrial junction. No significant osseous abnormality is noted.  Within visualized portion of the abdomen, enlarged retroperitoneal adenopathy is noted concerning for metastatic disease. Largest lymph node visualized measures 4.1 x 3.9 cm.  Review of the MIP images confirms the above findings.  IMPRESSION: Enlarged retroperitoneal adenopathy is noted within the visualized portion of the abdomen consistent with metastatic disease.  Positive for bilateral large acute PE with CT evidence of right heart strain  (RV/LV Ratio = 2) consistent with at least submassive (intermediate risk) PE. The presence of right heart strain has been associated with an increased risk of morbidity and mortality. Please activate Code PE by paging [phone]. Critical Value/emergent results were called by telephone at the time of interpretation on 07/04/2015 at 12:35 pm to Dr. Sherwood Gambler , who verbally acknowledged these results.   Electronically Signed   By: Marijo Conception, [name]   On: 07/04/2015 12:35    Microbiology: Recent Results (from the past 240 hour(s))  TECHNOLOGIST REVIEW     Status: None   Collection Time: 07/01/15  9:04 AM  Result Value Ref Range Status   Technologist Review   Final    Sl poly, Mod ovalos, Few Schistocytes, helmet, target, and burr cells  MRSA PCR Screening     Status: None   Collection Time: 07/04/15  3:53 PM  Result Value Ref Range Status   MRSA by PCR NEGATIVE NEGATIVE Final    Comment:        The GeneXpert MRSA Assay (FDA approved for NASAL specimens only), is one component of a comprehensive MRSA colonization surveillance program. It is not intended to diagnose MRSA infection nor to guide or monitor treatment for MRSA infections.      Labs: Basic Metabolic Panel:  Recent Labs Lab 07/04/15 1100 07/04/15 1103 07/04/15 1337 07/05/15 0135  NA 140 141  --  142  K 3.7 3.7  --  4.0  CL 107 105  --  110  CO2 24  --   --  24  GLUCOSE 137* 136*  --  113*  BUN 20 22*  --  19  CREATININE 1.44* 1.30*  --  1.40*  CALCIUM 8.8*  --   --  8.1*  MG  --   --  1.9  --    Liver Function Tests:  Recent Labs Lab 07/04/15 1100 07/04/15 1337 07/05/15 0135  AST 32 25 22  ALT 22 21 21   ALKPHOS 44 41 38  BILITOT 0.3 0.2* 0.4  PROT 7.0 6.6 6.3*  ALBUMIN 3.8 3.7 3.4*   No results for input(s): LIPASE, AMYLASE in the last 168 hours. No results for input(s): AMMONIA in the last 168 hours. CBC:  Recent Labs Lab 07/04/15 1100 07/04/15 1103  07/05/15 0135 07/06/15 0340  07/08/15 0430 07/08/15 1000  WBC 5.9  --  5.3 4.5 4.3 4.8  NEUTROABS 4.3  --   --   --   --   --   HGB 10.0* 10.9* 9.3* 9.1* 8.2* 9.1*  HCT 29.8* 32.0* 28.2* 27.9* 25.5* 28.1*  MCV 82.5  --  83.4 83.8 84.4 84.4  PLT 200  --  176 168 156 164   Cardiac Enzymes:  Recent Labs Lab 07/04/15 1337 07/04/15 1903 07/05/15 0135  TROPONINI 0.08* 0.08* 0.06*   BNP: BNP (last 3 results)  Recent Labs  07/04/15 1100  BNP 581.8*    ProBNP (last 3 results) No results for input(s): PROBNP in the last 8760 hours.  CBG: No results for input(s): GLUCAP in the last 168 hours.    Signed:  Vernell Leep, MD, FACP, FHM. [facility] Pager [phone]  If 7PM-7AM, please contact night-coverage [email or web address] Password [email or web address] 07/08/2015, 12:14 PM

## 2015-07-08 NOTE — Progress Notes (Signed)
Called Dr [name] office to make follow up appointment.  Per office the doctor has to review his chart prior to making an appointment and the office will call patient to finish making follow up appointment.  Patient and spouse educated. [name], [name]

## 2015-07-08 NOTE — Progress Notes (Signed)
Pt ambulated on RA in hallway and O2 stats remain >95% on room air.  Pt tolerated well. Long, Marissa A

## 2015-07-08 NOTE — Progress Notes (Addendum)
ED CM received a call from discharging MD about calls from pt pharmacy related to Lovenox cost of $500 Pt preferred pharmacy is providing pt with 4 doses at this time only   PCP: Simona Huh, MD  Oncology: Dr. Roxy Cedar Shadad/Dr. Bitting (at [facility])  Admit date: 07/04/2015 Discharge date: 07/08/2015  Recommendations for Outpatient Follow-up:  1. Dr. Gaynelle Arabian, PCP in 5-7 days with repeat labs (CBC & BMP). 2. Dr. Dorthula Matas, Oncology at [facility] 3. Dr. Zola Button, Oncology  Discharge Diagnoses:  Principal Problem:  Pulmonary embolism Active Problems: Prostate cancer,  Hypertension,  Acute pulmonary embolism,  Acute respiratory failure with hypoxia

## 2015-07-09 NOTE — Progress Notes (Signed)
Pt's coverage for Lovenox was verified with insurance company on 9/27 co-pay $170.00, generic $58.00. However, the company did not say pt was in a donut hole. Spoke with pt's wife [phone] concerning this issue. Spoke with Dr. Algis Liming and Phd Richardson Landry concerning this problem.  Will continue to try to find program or discounts for Lovenox.

## 2015-07-09 NOTE — Progress Notes (Signed)
[facility] ED CM call pt at [phone], updated pt and requested his permission to contact [place] health care customer service about LIS, his discharging Dr and oncologists at [facility] and [facility].  CM answered questions for pt and provided education on LIS, Medicare gap (donut hole) and Affordable care act related to medicare gap.  Pt voiced understanding Pt confirmed $518.38 monthly is not affordable for him at this time

## 2015-07-09 NOTE — Progress Notes (Addendum)
[place] with [employer] who checked to confirm pt has part D and provided possible 2 options 1) The MD can call [phone] to get pre authorization for "tier cost exception" and/or 2) the pt can call or give permission for customer service to be called to ask for LIS (low income subsidies)  1105 CM contacted pt's insurance carrier [employer] [place] with Janett Billow and [place] Referred to [facility] rx Specialty [phone]    [facility] ED PM CM reached out to [place] center staff, SW for possible assist with pt Lovenox on 06/28/15 [facility] ED AM CM received messages from [place] and [facility] left message for Carloyn Jaeger at [place] center Left Cm return mobile # for a return call

## 2015-07-09 NOTE — Progress Notes (Signed)
Pt is not eligable for any Lovenox assistance programs at this present time. Spoke with pt on telephone [phone], concerning Lovenox cost/co-pay.  [facility] has the best price and pt request syringes up to Oct 15th. [place], was able to get 20 Lovenox syringes from [facility].  Pt will have a co pay of $130.00 for Lovenox up to Oct. 15th. Pt is aware and okay with co pay.

## 2015-07-09 NOTE — Progress Notes (Signed)
ED CM spoke with unit CM Discussed interventions each completed.  Discussed [place] assist, pt not qualifying for other programs related to income.  Pt has been updated by unit CM.  ED CM called and updated discharging MD via phone message and mentioned possible pre authorization for tier cost exception.

## 2015-07-10 ENCOUNTER — Encounter: Payer: Self-pay | Admitting: Oncology

## 2015-07-10 ENCOUNTER — Other Ambulatory Visit: Payer: Self-pay | Admitting: Oncology

## 2015-07-10 DIAGNOSIS — I2699 Other pulmonary embolism without acute cor pulmonale: Secondary | ICD-10-CM

## 2015-07-10 MED ORDER — ENOXAPARIN SODIUM 30 MG/0.3ML ~~LOC~~ SOLN: 90.0000 mg | Freq: Two times a day (BID) | SUBCUTANEOUS | Status: DC

## 2015-07-10 NOTE — Progress Notes (Signed)
I sent message to dr. Ruby Cola to see if can get script for lovenox for poss asst

## 2015-07-11 ENCOUNTER — Encounter: Payer: Self-pay | Admitting: *Deleted

## 2015-07-11 NOTE — Progress Notes (Signed)
lovenox script given to raquel in managed care.

## 2015-07-15 ENCOUNTER — Encounter: Payer: Self-pay | Admitting: Oncology

## 2015-07-15 NOTE — Progress Notes (Signed)
I placed prior auth form on desk of nurse for dr. shadad. °

## 2015-07-15 NOTE — Progress Notes (Signed)
I faxed [facility] on 07/11/15 req for lovenox

## 2015-07-16 ENCOUNTER — Encounter: Payer: Self-pay | Admitting: Oncology

## 2015-07-16 NOTE — Progress Notes (Signed)
Per [employer] enoxaparin was approved thru 10/10/16 pa# [id number] under medicare part d

## 2015-07-17 ENCOUNTER — Telehealth: Payer: Self-pay | Admitting: *Deleted

## 2015-07-17 ENCOUNTER — Other Ambulatory Visit: Payer: Self-pay | Admitting: *Deleted

## 2015-07-17 ENCOUNTER — Encounter: Payer: Self-pay | Admitting: *Deleted

## 2015-07-17 MED ORDER — ENOXAPARIN SODIUM 100 MG/ML ~~LOC~~ SOLN: 90.0000 mg | Freq: Two times a day (BID) | SUBCUTANEOUS | Status: DC

## 2015-07-17 MED ORDER — ENOXAPARIN SODIUM 100 MG/ML ~~LOC~~ SOLN: 100.0000 mg | Freq: Two times a day (BID) | SUBCUTANEOUS | Status: DC

## 2015-07-17 NOTE — Telephone Encounter (Signed)
-----   Message from Mariam Dollar sent at 07/16/2015  4:38 PM EDT ----- Regarding: Lovenox Hey Adiya Selmer, Please call [facility] about getting a new script for Lovenox. She said the patient wants to do 1 shot per day?? and they will need new script.  Jennifer [address] [place]  Thanks, Raquel

## 2015-07-25 ENCOUNTER — Other Ambulatory Visit: Payer: Self-pay | Admitting: *Deleted

## 2015-07-25 DIAGNOSIS — C61 Malignant neoplasm of prostate: Secondary | ICD-10-CM

## 2015-07-25 DIAGNOSIS — I2699 Other pulmonary embolism without acute cor pulmonale: Secondary | ICD-10-CM

## 2015-07-25 DIAGNOSIS — D696 Thrombocytopenia, unspecified: Secondary | ICD-10-CM

## 2015-07-25 MED ORDER — ENOXAPARIN SODIUM 100 MG/ML ~~LOC~~ SOLN: 90.0000 mg | SUBCUTANEOUS | Status: DC

## 2015-07-25 NOTE — Telephone Encounter (Signed)
E-scribed a prescription for Lovenox to [facility].

## 2015-07-29 ENCOUNTER — Encounter: Payer: Self-pay | Admitting: *Deleted

## 2015-07-29 NOTE — Progress Notes (Signed)
Spoke with jennifer at [facility], she states patient has decided to go with [place] outpatient pharmacy for lovenox. Spoke with pharmacist scott at [facility]. He states no money is available for lovenox. Co-pay per month is $565.00. Asked if our pharmacy had samples. No, we do not.

## 2015-08-11 ENCOUNTER — Other Ambulatory Visit (HOSPITAL_BASED_OUTPATIENT_CLINIC_OR_DEPARTMENT_OTHER): Payer: Medicare Other

## 2015-08-11 ENCOUNTER — Ambulatory Visit (HOSPITAL_BASED_OUTPATIENT_CLINIC_OR_DEPARTMENT_OTHER): Payer: Medicare Other

## 2015-08-11 DIAGNOSIS — C7951 Secondary malignant neoplasm of bone: Secondary | ICD-10-CM

## 2015-08-11 DIAGNOSIS — C61 Malignant neoplasm of prostate: Secondary | ICD-10-CM

## 2015-08-11 DIAGNOSIS — Z95828 Presence of other vascular implants and grafts: Secondary | ICD-10-CM

## 2015-08-11 LAB — COMPREHENSIVE METABOLIC PANEL (CC13)
ALT: <9 U/L (ref 0–55)
AST: 12 U/L (ref 5–34)
Albumin: 3.4 g/dL — ABNORMAL LOW (ref 3.5–5.0)
Alkaline Phosphatase: 38 U/L — ABNORMAL LOW (ref 40–150)
Anion Gap: 6 mEq/L (ref 3–11)
BUN: 11.2 mg/dL (ref 7.0–26.0)
CHLORIDE: 111 meq/L — AB (ref 98–109)
CO2: 25 mEq/L (ref 22–29)
Calcium: 9.4 mg/dL (ref 8.4–10.4)
Creatinine: 1 mg/dL (ref 0.7–1.3)
EGFR: 90 mL/min/{1.73_m2} — AB (ref >90)
GLUCOSE: 90 mg/dL (ref 70–140)
POTASSIUM: 3.9 meq/L (ref 3.5–5.1)
SODIUM: 142 meq/L (ref 136–145)
Total Bilirubin: 0.34 mg/dL (ref 0.20–1.20)
Total Protein: 6.6 g/dL (ref 6.4–8.3)

## 2015-08-11 LAB — CBC WITH DIFFERENTIAL/PLATELET
BASO%: 0.4 % (ref 0.0–2.0)
Basophils Absolute: 0 10*3/uL (ref 0.0–0.1)
EOS ABS: 0.1 10*3/uL (ref 0.0–0.5)
EOS%: 2.1 % (ref 0.0–7.0)
HCT: 26.7 % — ABNORMAL LOW (ref 38.4–49.9)
HGB: 8.4 g/dL — ABNORMAL LOW (ref 13.0–17.1)
LYMPH%: 20.2 % (ref 14.0–49.0)
MCH: 27.8 pg (ref 27.2–33.4)
MCHC: 31.5 g/dL — AB (ref 32.0–36.0)
MCV: 88.4 fL (ref 79.3–98.0)
MONO#: 0.4 10*3/uL (ref 0.1–0.9)
MONO%: 8.5 % (ref 0.0–14.0)
NEUT%: 68.8 % (ref 39.0–75.0)
NEUTROS ABS: 3.2 10*3/uL (ref 1.5–6.5)
NRBC: 1 % — AB (ref 0–0)
Platelets: 160 10*3/uL (ref 140–400)
RBC: 3.02 10*6/uL — AB (ref 4.20–5.82)
RDW: 24.4 % — AB (ref 11.0–14.6)
WBC: 4.7 10*3/uL (ref 4.0–10.3)
lymph#: 1 10*3/uL (ref 0.9–3.3)

## 2015-08-11 MED ORDER — SODIUM CHLORIDE 0.9 % IJ SOLN
10.0000 mL | INTRAMUSCULAR | Status: DC | PRN
  Administered 2015-08-11: 10 mL via INTRAVENOUS
  Filled 2015-08-11: qty 10

## 2015-08-11 MED ORDER — HEPARIN SOD (PORK) LOCK FLUSH 100 UNIT/ML IV SOLN
500.0000 [IU] | Freq: Once | INTRAVENOUS | Status: AC
  Administered 2015-08-11: 500 [IU] via INTRAVENOUS
  Filled 2015-08-11: qty 5

## 2015-08-12 ENCOUNTER — Ambulatory Visit (HOSPITAL_BASED_OUTPATIENT_CLINIC_OR_DEPARTMENT_OTHER): Payer: Medicare Other | Admitting: Oncology

## 2015-08-12 ENCOUNTER — Ambulatory Visit (HOSPITAL_BASED_OUTPATIENT_CLINIC_OR_DEPARTMENT_OTHER): Payer: Medicare Other

## 2015-08-12 ENCOUNTER — Telehealth: Payer: Self-pay | Admitting: Oncology

## 2015-08-12 VITALS — BP 146/70 | HR 72 | Temp 97.7°F | Resp 20 | Ht 68.0 in | Wt 203.1 lb

## 2015-08-12 DIAGNOSIS — G893 Neoplasm related pain (acute) (chronic): Secondary | ICD-10-CM | POA: Diagnosis not present

## 2015-08-12 DIAGNOSIS — N1330 Unspecified hydronephrosis: Secondary | ICD-10-CM | POA: Diagnosis not present

## 2015-08-12 DIAGNOSIS — C7951 Secondary malignant neoplasm of bone: Secondary | ICD-10-CM

## 2015-08-12 DIAGNOSIS — I2699 Other pulmonary embolism without acute cor pulmonale: Secondary | ICD-10-CM

## 2015-08-12 DIAGNOSIS — E291 Testicular hypofunction: Secondary | ICD-10-CM

## 2015-08-12 DIAGNOSIS — C61 Malignant neoplasm of prostate: Secondary | ICD-10-CM

## 2015-08-12 LAB — PSA: PSA: 1391 ng/mL — ABNORMAL HIGH (ref <=4.00)

## 2015-08-12 MED ORDER — DENOSUMAB 120 MG/1.7ML ~~LOC~~ SOLN
120.0000 mg | Freq: Once | SUBCUTANEOUS | Status: AC
  Administered 2015-08-12: 120 mg via SUBCUTANEOUS
  Filled 2015-08-12: qty 1.7

## 2015-08-12 MED ORDER — OXYCODONE HCL 5 MG PO TABS: 5.0000 mg | ORAL_TABLET | ORAL | Status: DC | PRN

## 2015-08-12 NOTE — Progress Notes (Signed)
Hematology and Oncology Follow Up Visit  Domanick Cuccia [id number] May 27, 1946 69 y.o. 08/12/2015 10:59 AM   [name], [name]  [name], [name]  Principle Diagnosis: This is a 69 year old gentleman with prostate cancer initially diagnosed in 2001.  He had a Gleason score of 3 + 3 = 6.  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     Prior Therapy:  1. Status post prostatectomy done in May 2001.  He had T3 N1 disease.  PSA nadir to 0.  2. The patient developed biochemical relapse and received prostatic bed radiation.  3. The patient received Lupron with Casodex due to rising PSA.  The patient subsequently developed castration-resistant disease. 4. Patient treated with Casodex withdrawal and subsequently PSA rose to 18. 5. Patient with Provenge immunotherapy completed in July 2011. 6.  He received ketoconazole and prednisone December 2011 through January 2014. 7.         He is S/P Zytiga 1000 mg daily with Prednisone 5 mg daily on 11/03/12 till 07/2013. This was stopped due to progression of disease.  8.  Xtandi 1000 mg daily 07/2013 through 01/24/2014. Discontinued secondary to disease progression 9.         Docetaxel 75 mg/m2 with Neulasta support, status post 5 cycles. Discontinued 06/05/2014 secondary to disease progression. 10.      Systemic chemotherapy with Jevtana 25 mg/m2 given every 3 weeks. This was dose reduced to 20 mg started on cycle 12. Status post 15 cycles. Therapy discontinued due to progression of disease.  Current therapy: 1. Lynparza (olaparib) 400 mg po BID. Therapy started around 05/04/2015. 2. He is on Lupron 30 mg every 4 months. Last time given on 07/01/2015. 3. Xgeva given every 6 weeks last treatment given on 07/01/2015.  Interim History: [name] presents today for a followup visit with his wife. Since the last visit, he was hospitalized for a pulmonary embolism on 07/04/2015. Since that time, he has  been on Lovenox and have tolerated it well. He does report intermittent hematuria since he's been on Lovenox. His respiratory status has stabilized. Does not report any cough or shortness of breath. Does not report any dyspnea on exertion.   He continues taking Olaparib and have tolerated it well. He does report some occasional nausea but for the most part have been manageable. He denies any delayed complications from chemotherapy such as neuropathy. He does report decreased appetite and have lost some weight since the last visit. He continues to have some lower extremity edema which has not changed.  His quality of life and continues to be relatively the same.   He continues to have periodic back pain and pelvic pain and takes oxycodone as needed. His pain has been manageable with oxycodone at this time.  He does not report any headaches, blurry vision, syncope or seizures. He does report some occasional lightheadedness. He does not report any chest pain, palpitation or orthopnea. He does report occasional leg edema. He does not report any cough, hemoptysis or hematemesis. Does not report any constipation, diarrhea or early satiety. He does not report any arthralgias or myalgias. He does not report any back pain or shoulder pain. He does not report any lymphadenopathy or petechiae. Remainder of his review of systems unremarkable.     Medications: I have reviewed the patient's current medications. Current Outpatient Prescriptions  Medication Sig Dispense Refill  . calcium carbonate (OS-CAL) 1250 MG chewable tablet Chew 1 tablet by mouth 2 (  two) times daily.     [name] enoxaparin (LOVENOX) 100 MG/ML injection Inject 0.9 mLs (90 mg total) into the skin daily. 60 Syringe 1  . feeding supplement, ENSURE ENLIVE, (ENSURE ENLIVE) LIQD Take 237 mLs by mouth 3 (three) times daily between meals.    [name] leuprolide (LUPRON) 30 MG injection Inject 30 mg into the muscle every 4 (four) months. LAST INJECTION JAN 2016     . lidocaine-prilocaine (EMLA) cream Apply topically as needed. (Patient taking differently: Apply 1 application topically as needed (for port). ) 1 g 2  . loratadine (CLARITIN) 10 MG tablet Take 10 mg by mouth daily.    . mirabegron ER (MYRBETRIQ) 50 MG TB24 tablet Take 50 mg by mouth daily.    [name] olaparib (LYNPARZA) 50 MG capsule Take 400 mg by mouth 2 (two) times daily. Swallow whole.    . ondansetron (ZOFRAN) 8 MG tablet Take 1 tablet (8 mg total) by mouth every 8 (eight) hours as needed for nausea or vomiting. 20 tablet 3  . oxyCODONE (OXY IR/ROXICODONE) 5 MG immediate release tablet Take 1 tablet (5 mg total) by mouth every 4 (four) hours as needed for severe pain. 60 tablet 0   No current facility-administered medications for this visit.   Facility-Administered Medications Ordered in Other Visits  Medication Dose Route Frequency Provider Last Rate Last Dose  . denosumab (XGEVA) injection 120 mg  120 mg Subcutaneous Once [name], MD        Allergies: No Known Allergies  Past Medical History, Surgical history, Social history, and Family History were reviewed and updated.    Physical Exam: Blood pressure 146/70, pulse 72, temperature 97.7 F (36.5 C), temperature source Oral, resp. rate 20, height 5\' 8"  (1.727 m), weight 203 lb 1.6 oz (92.126 kg), SpO2 100 %. ECOG: 1 General appearance: alert awake chronically ill-appearing. Head: Normocephalic, without obvious abnormality no oral ulcers or lesions. Neck: no adenopathy Lymph nodes: Cervical, supraclavicular, and axillary nodes normal. Heart:regular rate and rhythm, S1, S2 normal, no murmur, click, rub or gallop Lung:chest clear, no wheezing, rales. Decreased breath sounds at the bases bilaterally. Chest wall examination: Right anterior chest porta cath, well healed. Abdomen: soft, non-tender, without masses or organomegaly or shifting dullness or ascites. EXT:1+ edema, greater on the left than on the right.   Lab  Results: Lab Results  Component Value Date   WBC 4.7 08/11/2015   HGB 8.4* 08/11/2015   HCT 26.7* 08/11/2015   MCV 88.4 08/11/2015   PLT 160 08/11/2015     Chemistry      Component Value Date/Time   NA 142 08/11/2015 0927   NA 142 07/05/2015 0135   K 3.9 08/11/2015 0927   K 4.0 07/05/2015 0135   CL 110 07/05/2015 0135   CL 108* 03/09/2013 0923   CO2 25 08/11/2015 0927   CO2 24 07/05/2015 0135   BUN 11.2 08/11/2015 0927   BUN 19 07/05/2015 0135   CREATININE 1.0 08/11/2015 0927   CREATININE 1.40* 07/05/2015 0135      Component Value Date/Time   CALCIUM 9.4 08/11/2015 0927   CALCIUM 8.1* 07/05/2015 0135   ALKPHOS 38* 08/11/2015 0927   ALKPHOS 38 07/05/2015 0135   AST 12 08/11/2015 0927   AST 22 07/05/2015 0135   ALT <9 08/11/2015 0927   ALT 21 07/05/2015 0135   BILITOT 0.34 08/11/2015 0927   BILITOT 0.4 07/05/2015 0135        Results for Catalano, Giannis L JR. (MRN  [id number]) as of 08/12/2015 11:00  Ref. Range 04/24/2015 09:12 05/14/2015 08:09 07/01/2015 09:04 08/11/2015 09:26  PSA Latest Ref Range: <=4.00 ng/mL 1306.00 (H) 1380.00 (H) 1519.00 (H) 1391.00 (H)          Impression and Plan:  This is a 69 year old gentleman with the following issues: 1. Castration-resistant prostate cancer with pelvic adenopathy. He has progressed on multiple therapies the last of which is Jevtana chemotherapy. His Foundation Medicine testing through [facility] showed that he might respond to a PARP inhibitor Lonie Peak- olaparib). He has been on this medication and tolerated it well. His PSA is have declined as of late to down to 1391 from 1500. The plan is to continue the same dose and schedule and repeat imaging studies in January 2017. 2. IV access: Status post right anterior chest Port-A-Cath placement on 02/06/2014. Continue EMLA cream as needed. This will be flushed every 6 weeks. 3. Hydronephrosis: He is S/P stent placement. Laboratory data were reviewed  including his kidney function which appear to be normal. 4. Hormonal deprivation.  He continues to be on Lupron. Last given on 07/01/2015. 5. 3.8 cm enhancing mass in the upper pole of the left kidney likely renal cell carcinoma. Repeat imaging studies in the future will follow the progression of this tumor is noted. 6. Bone Directed therapy: He is currently on Xgeva last given on 07/01/2015 oh repeated today.   7. Pulmonary embolism: He is currently on Lovenox and will probably require lifetime anticoagulation.  8. Abdominal pain: Lower pelvic fullness related to his malignancy. He takes oxycodone with relief of the pain. I will refill his medication at this time. 9. Follow-up: Will be in 6 weeks.   OZHYQM,VHQIO, MD 11/1/201610:59 AM

## 2015-08-12 NOTE — Telephone Encounter (Signed)
per pof tos ch pt appt-addd on inj-gave pt copy of avs

## 2015-09-03 ENCOUNTER — Telehealth: Payer: Self-pay | Admitting: Oncology

## 2015-09-03 NOTE — Telephone Encounter (Signed)
pt came in to r/s appts-gave updated copy of avs

## 2015-09-19 ENCOUNTER — Other Ambulatory Visit (HOSPITAL_COMMUNITY): Payer: Self-pay | Admitting: Family Medicine

## 2015-09-19 ENCOUNTER — Other Ambulatory Visit: Payer: Self-pay | Admitting: Family Medicine

## 2015-09-19 DIAGNOSIS — H3561 Retinal hemorrhage, right eye: Secondary | ICD-10-CM

## 2015-09-22 ENCOUNTER — Ambulatory Visit (HOSPITAL_COMMUNITY)
Admission: RE | Admit: 2015-09-22 | Discharge: 2015-09-22 | Disposition: A | Discharge status: Home / Self Care | Payer: Medicare Other | Source: Ambulatory Visit | Attending: Family Medicine | Admitting: Family Medicine

## 2015-09-22 DIAGNOSIS — I10 Essential (primary) hypertension: Secondary | ICD-10-CM | POA: Diagnosis not present

## 2015-09-22 DIAGNOSIS — H3561 Retinal hemorrhage, right eye: Secondary | ICD-10-CM | POA: Diagnosis not present

## 2015-09-22 DIAGNOSIS — I779 Disorder of arteries and arterioles, unspecified: Secondary | ICD-10-CM | POA: Insufficient documentation

## 2015-09-22 NOTE — Progress Notes (Signed)
*  PRELIMINARY RESULTS* Vascular Ultrasound Carotid Duplex (Doppler) has been completed.  Preliminary findings: Bilateral: No significant (1-39%) ICA stenosis. Antegrade vertebral flow.  Incidental finding: Right IJV chronic thrombus.   Called results of incidental findings to MD office.   Landry Mellow, RDMS, RVT  09/22/2015, 3:07 PM

## 2015-09-23 ENCOUNTER — Other Ambulatory Visit: Payer: Medicare Other

## 2015-09-25 ENCOUNTER — Other Ambulatory Visit: Payer: Medicare Other

## 2015-09-25 ENCOUNTER — Ambulatory Visit: Payer: Medicare Other | Admitting: Oncology

## 2015-09-26 ENCOUNTER — Other Ambulatory Visit: Payer: Medicare Other

## 2015-09-30 ENCOUNTER — Other Ambulatory Visit: Payer: Self-pay | Admitting: *Deleted

## 2015-09-30 ENCOUNTER — Encounter: Payer: Self-pay | Admitting: *Deleted

## 2015-09-30 DIAGNOSIS — C61 Malignant neoplasm of prostate: Secondary | ICD-10-CM

## 2015-09-30 DIAGNOSIS — D696 Thrombocytopenia, unspecified: Secondary | ICD-10-CM

## 2015-09-30 DIAGNOSIS — I2699 Other pulmonary embolism without acute cor pulmonale: Secondary | ICD-10-CM

## 2015-09-30 MED ORDER — ENOXAPARIN SODIUM 100 MG/ML ~~LOC~~ SOLN: 90.0000 mg | SUBCUTANEOUS | Status: DC

## 2015-09-30 NOTE — Telephone Encounter (Signed)
Per dr Alen Blew, e-scribed new script for lovenox to [facility].

## 2015-10-09 ENCOUNTER — Other Ambulatory Visit: Payer: Self-pay | Admitting: Urology

## 2015-10-10 ENCOUNTER — Ambulatory Visit (HOSPITAL_BASED_OUTPATIENT_CLINIC_OR_DEPARTMENT_OTHER): Payer: Medicare Other

## 2015-10-10 ENCOUNTER — Other Ambulatory Visit (HOSPITAL_BASED_OUTPATIENT_CLINIC_OR_DEPARTMENT_OTHER): Payer: Medicare Other

## 2015-10-10 VITALS — BP 135/79 | HR 66 | Temp 98.1°F | Resp 20

## 2015-10-10 DIAGNOSIS — Z95828 Presence of other vascular implants and grafts: Secondary | ICD-10-CM

## 2015-10-10 DIAGNOSIS — C61 Malignant neoplasm of prostate: Secondary | ICD-10-CM | POA: Diagnosis present

## 2015-10-10 LAB — COMPREHENSIVE METABOLIC PANEL
ALT: <9 U/L (ref 0–55)
ANION GAP: 8 meq/L (ref 3–11)
AST: 11 U/L (ref 5–34)
Albumin: 3.4 g/dL — ABNORMAL LOW (ref 3.5–5.0)
Alkaline Phosphatase: 40 U/L (ref 40–150)
BILIRUBIN TOTAL: 0.31 mg/dL (ref 0.20–1.20)
BUN: 12.9 mg/dL (ref 7.0–26.0)
CALCIUM: 8.9 mg/dL (ref 8.4–10.4)
CO2: 26 meq/L (ref 22–29)
Chloride: 108 mEq/L (ref 98–109)
Creatinine: 1.2 mg/dL (ref 0.7–1.3)
EGFR: 69 mL/min/{1.73_m2} — AB (ref >90)
Glucose: 99 mg/dl (ref 70–140)
Potassium: 4.3 mEq/L (ref 3.5–5.1)
Sodium: 142 mEq/L (ref 136–145)
TOTAL PROTEIN: 6.8 g/dL (ref 6.4–8.3)

## 2015-10-10 LAB — CBC WITH DIFFERENTIAL/PLATELET
BASO%: 0.9 % (ref 0.0–2.0)
Basophils Absolute: 0 10*3/uL (ref 0.0–0.1)
EOS ABS: 0.1 10*3/uL (ref 0.0–0.5)
EOS%: 2.6 % (ref 0.0–7.0)
HEMATOCRIT: 28.3 % — AB (ref 38.4–49.9)
HEMOGLOBIN: 9 g/dL — AB (ref 13.0–17.1)
LYMPH#: 0.8 10*3/uL — AB (ref 0.9–3.3)
LYMPH%: 22.4 % (ref 14.0–49.0)
MCH: 29.6 pg (ref 27.2–33.4)
MCHC: 31.8 g/dL — AB (ref 32.0–36.0)
MCV: 93.1 fL (ref 79.3–98.0)
MONO#: 0.3 10*3/uL (ref 0.1–0.9)
MONO%: 9.9 % (ref 0.0–14.0)
NEUT%: 64.2 % (ref 39.0–75.0)
NEUTROS ABS: 2.2 10*3/uL (ref 1.5–6.5)
PLATELETS: 142 10*3/uL (ref 140–400)
RBC: 3.04 10*6/uL — ABNORMAL LOW (ref 4.20–5.82)
RDW: 18.9 % — ABNORMAL HIGH (ref 11.0–14.6)
WBC: 3.4 10*3/uL — AB (ref 4.0–10.3)
nRBC: 1 % — ABNORMAL HIGH (ref 0–0)

## 2015-10-10 MED ORDER — HEPARIN SOD (PORK) LOCK FLUSH 100 UNIT/ML IV SOLN
500.0000 [IU] | Freq: Once | INTRAVENOUS | Status: AC
  Administered 2015-10-10: 500 [IU] via INTRAVENOUS
  Filled 2015-10-10: qty 5

## 2015-10-10 MED ORDER — SODIUM CHLORIDE 0.9 % IJ SOLN
10.0000 mL | INTRAMUSCULAR | Status: DC | PRN
  Administered 2015-10-10: 10 mL via INTRAVENOUS
  Filled 2015-10-10: qty 10

## 2015-10-10 NOTE — Patient Instructions (Signed)

## 2015-10-11 LAB — PSA: PSA: 1410 ng/mL — ABNORMAL HIGH (ref <=4.00)

## 2015-10-14 ENCOUNTER — Telehealth: Payer: Self-pay | Admitting: *Deleted

## 2015-10-14 ENCOUNTER — Encounter: Payer: Self-pay | Admitting: *Deleted

## 2015-10-14 NOTE — Telephone Encounter (Signed)
TC from [facility] requesting clarification of pt's lovenox dosage.  Pt had been on BID injections, and prescription most recently written states 1 x a day. Pharmacy wants to know correct dosage.  Call pharmacy @ [phone]

## 2015-10-14 NOTE — Telephone Encounter (Signed)
Spoke with sister karen,patient sat on the commode all night last night, with diarrhea and vomiting.  patient eating now. Seems okay.  If he continues to have diarrhea and vomiting with imodium and zofran, he is to return to the E.D. Refill for zofran called to [facility] in [place]. Note to dr Hazeline Junker desk.

## 2015-10-15 ENCOUNTER — Encounter (INDEPENDENT_AMBULATORY_CARE_PROVIDER_SITE_OTHER): Payer: Medicare Other | Admitting: Ophthalmology

## 2015-10-15 DIAGNOSIS — H35033 Hypertensive retinopathy, bilateral: Secondary | ICD-10-CM

## 2015-10-15 DIAGNOSIS — I10 Essential (primary) hypertension: Secondary | ICD-10-CM

## 2015-10-15 DIAGNOSIS — H43813 Vitreous degeneration, bilateral: Secondary | ICD-10-CM | POA: Diagnosis not present

## 2015-10-15 DIAGNOSIS — E11311 Type 2 diabetes mellitus with unspecified diabetic retinopathy with macular edema: Secondary | ICD-10-CM | POA: Diagnosis not present

## 2015-10-15 DIAGNOSIS — E113391 Type 2 diabetes mellitus with moderate nonproliferative diabetic retinopathy without macular edema, right eye: Secondary | ICD-10-CM

## 2015-10-15 DIAGNOSIS — E113292 Type 2 diabetes mellitus with mild nonproliferative diabetic retinopathy without macular edema, left eye: Secondary | ICD-10-CM | POA: Diagnosis not present

## 2015-10-15 DIAGNOSIS — H59031 Cystoid macular edema following cataract surgery, right eye: Secondary | ICD-10-CM

## 2015-10-16 ENCOUNTER — Telehealth: Payer: Self-pay | Admitting: Oncology

## 2015-10-16 ENCOUNTER — Ambulatory Visit (HOSPITAL_BASED_OUTPATIENT_CLINIC_OR_DEPARTMENT_OTHER): Payer: Medicare Other | Admitting: Oncology

## 2015-10-16 VITALS — BP 143/67 | HR 77 | Temp 97.6°F | Resp 18 | Wt 201.0 lb

## 2015-10-16 DIAGNOSIS — C7951 Secondary malignant neoplasm of bone: Secondary | ICD-10-CM | POA: Diagnosis not present

## 2015-10-16 DIAGNOSIS — E291 Testicular hypofunction: Secondary | ICD-10-CM | POA: Diagnosis not present

## 2015-10-16 DIAGNOSIS — N1330 Unspecified hydronephrosis: Secondary | ICD-10-CM

## 2015-10-16 DIAGNOSIS — N2889 Other specified disorders of kidney and ureter: Secondary | ICD-10-CM

## 2015-10-16 DIAGNOSIS — C61 Malignant neoplasm of prostate: Secondary | ICD-10-CM | POA: Diagnosis present

## 2015-10-16 MED ORDER — OXYCODONE HCL 5 MG PO TABS: 5.0000 mg | ORAL_TABLET | ORAL | Status: DC | PRN

## 2015-10-16 NOTE — Progress Notes (Signed)
Patient given 5 samples of lovenox, until his medicare is available.

## 2015-10-16 NOTE — Telephone Encounter (Signed)
per pof to sch pt appt-per DrShadad to change appt to week later due to [facility] pt copy of avs

## 2015-10-16 NOTE — Progress Notes (Signed)
Hematology and Oncology Follow Up Visit  [name] [id number] April 22, 1946 70 y.o. 10/16/2015 9:44 AM   [name], [name]  [name], [name]  Principle Diagnosis: This is a 70 year old gentleman with prostate cancer initially diagnosed in 2001.  He had a Gleason score of 3 + 3 = 6.  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     Prior Therapy:  1. Status post prostatectomy done in May 2001.  He had T3 N1 disease.  PSA nadir to 0.  2. The patient developed biochemical relapse and received prostatic bed radiation.  3. The patient received Lupron with Casodex due to rising PSA.  The patient subsequently developed castration-resistant disease. 4. Patient treated with Casodex withdrawal and subsequently PSA rose to 18. 5. Patient with Provenge immunotherapy completed in July 2011. 6.  He received ketoconazole and prednisone December 2011 through January 2014. 7.         He is S/P Zytiga 1000 mg daily with Prednisone 5 mg daily on 11/03/12 till 07/2013. This was stopped due to progression of disease.  8.  Xtandi 1000 mg daily 07/2013 through 01/24/2014. Discontinued secondary to disease progression 9.         Docetaxel 75 mg/m2 with Neulasta support, status post 5 cycles. Discontinued 06/05/2014 secondary to disease progression. 10.      Systemic chemotherapy with Jevtana 25 mg/m2 given every 3 weeks. This was dose reduced to 20 mg started on cycle 12. Status post 15 cycles. Therapy discontinued due to progression of disease.  Current therapy: 1. Lynparza (olaparib) 400 mg po BID. Therapy started around 05/04/2015. 2. He is on Lupron 30 mg every 4 months. Last time given on 07/01/2015.  This will be repeated on 12/04/2015. 3. Xgeva given every 6 weeks.This will be given on 12/04/2015. 4. He is on Lovenox for full dose anticoagulation for pulmonary embolism. He is currently on 90 mg daily.  Interim History: [name] presents today for  a followup visit with his wife. Since the last visit, he reports no major changes in his health. He has not reported worsening abdominal pain or back pain but does report intermittent discomfort in those areas for which takes oxycodone. This medication helps relieve his symptoms at this time.  He continues on Lovenox and have tolerated it well. He does report intermittent hematuria since he's been on Lovenox. His respiratory status has stabilized. Does not report any cough or shortness of breath. Does not report any dyspnea on exertion. He continued to have reasonable quality of life and performance status.  He continues taking Olaparib and have tolerated it well He denies any delayed complications from chemotherapy such as neuropathy. He does report decreased appetite but weight have been stable since the last visit. He continues to have some lower extremity edema which has not changed.  His quality of life and continues to be relatively the same.   He does not report any headaches, blurry vision, syncope or seizures. He does report some occasional lightheadedness. He does not report any chest pain, palpitation or orthopnea. He does report occasional leg edema. He does not report any cough, hemoptysis or hematemesis. Does not report any constipation, diarrhea or early satiety. He does not report any arthralgias or myalgias. He does not report any back pain or shoulder pain. He does not report any lymphadenopathy or petechiae. Remainder of his review of systems unremarkable.     Medications: I have reviewed the patient's current medications.  Current Outpatient Prescriptions  Medication Sig Dispense Refill  . brimonidine (ALPHAGAN) 0.15 % ophthalmic solution Place 1 drop into the left eye every 2 (two) hours while awake.     . calcium carbonate (OS-CAL) 1250 MG chewable tablet Chew 1 tablet by mouth 2 (two) times daily.     [name] enoxaparin (LOVENOX) 100 MG/ML injection Inject 0.9 mLs (90 mg total) into  the skin daily. (Patient taking differently: Inject 90 mg into the skin every 12 (twelve) hours. ) 60 Syringe 1  . ketorolac (ACULAR) 0.5 % ophthalmic solution Place 1 drop into the left eye 4 (four) times daily.     [name] leuprolide (LUPRON) 30 MG injection Inject 30 mg into the muscle every 4 (four) months.     . lidocaine-prilocaine (EMLA) cream Apply topically as needed. (Patient taking differently: Apply 1 application topically as needed (for port). ) 1 g 2  . loratadine (CLARITIN) 10 MG tablet Take 10 mg by mouth daily.    [name] ofloxacin (OCUFLOX) 0.3 % ophthalmic solution Place 1 drop into the left eye 4 (four) times daily.     [name] olaparib (LYNPARZA) 50 MG capsule Take 400 mg by mouth 2 (two) times daily. Swallow whole.    . ondansetron (ZOFRAN) 8 MG tablet Take 1 tablet (8 mg total) by mouth every 8 (eight) hours as needed for nausea or vomiting. 20 tablet 3  . oxyCODONE (OXY IR/ROXICODONE) 5 MG immediate release tablet Take 1 tablet (5 mg total) by mouth every 4 (four) hours as needed for severe pain. 60 tablet 0  . [START ON 10/17/2015] prednisoLONE acetate (PRED FORTE) 1 % ophthalmic suspension Place 1 drop into the left eye every 2 (two) hours while awake.      No current facility-administered medications for this visit.    Allergies: No Known Allergies  Past Medical History, Surgical history, Social history, and Family History were reviewed and updated.    Physical Exam: Blood pressure 143/67, pulse 77, temperature 97.6 F (36.4 C), temperature source Oral, resp. rate 18, weight 201 lb (91.173 kg), SpO2 100 %. ECOG: 1 General appearance: alert awake gentleman without distress. Head: Normocephalic, without obvious abnormality no thrush noted. Neck: no adenopathy Lymph nodes: Cervical, supraclavicular, and axillary nodes normal. Heart:regular rate and rhythm, S1, S2 normal, no murmur, click, rub or gallop Lung:chest clear, no wheezing, rales. Decreased breath sounds at the bases  bilaterally. Chest wall examination: Right anterior chest porta cath, well healed. Abdomen: soft, non-tender, without masses or organomegaly or shifting dullness or ascites. EXT:1+ edema, greater on the left than on the right.   Lab Results: Lab Results  Component Value Date   WBC 3.4* 10/10/2015   HGB 9.0* 10/10/2015   HCT 28.3* 10/10/2015   MCV 93.1 10/10/2015   PLT 142 10/10/2015     Chemistry      Component Value Date/Time   NA 142 10/10/2015 0945   NA 142 07/05/2015 0135   K 4.3 10/10/2015 0945   K 4.0 07/05/2015 0135   CL 110 07/05/2015 0135   CL 108* 03/09/2013 0923   CO2 26 10/10/2015 0945   CO2 24 07/05/2015 0135   BUN 12.9 10/10/2015 0945   BUN 19 07/05/2015 0135   CREATININE 1.2 10/10/2015 0945   CREATININE 1.40* 07/05/2015 0135      Component Value Date/Time   CALCIUM 8.9 10/10/2015 0945   CALCIUM 8.1* 07/05/2015 0135   ALKPHOS 40 10/10/2015 0945   ALKPHOS 38 07/05/2015 0135   AST 11  10/10/2015 0945   AST 22 07/05/2015 0135   ALT <9 10/10/2015 0945   ALT 21 07/05/2015 0135   BILITOT 0.31 10/10/2015 0945   BILITOT 0.4 07/05/2015 0135      Results for BRISTON, HANCHEY (MRN [id number]) as of 10/16/2015 09:23  Ref. Range 07/01/2015 09:04 08/11/2015 09:26 10/10/2015 09:45  PSA Latest Ref Range: <=4.00 ng/mL 1519.00 (H) 1391.00 (H) 1410.00 (H)      Impression and Plan:  This is a 70 year old gentleman with the following issues: 1. Castration-resistant prostate cancer with pelvic adenopathy. He has progressed on multiple therapies the last of which is Jevtana chemotherapy. His Foundation Medicine testing through [facility] showed that he might respond to a PARP inhibitor Lonie Peak- olaparib). He has been on this medication  Since July 2016 and tolerated it well. His PSA continues to be relatively stable and have fluctuated between 1300 and 1400. His imaging studies showed stable cancer at this time. The plan is to continue on the  same dose and schedule. 2. IV access: Status post right anterior chest Port-A-Cath placement on 02/06/2014.This will be flushed every 6 weeks. 3. Hydronephrosis: He is S/P stent placement. His creatinine have normalized at this time. 4. Hormonal deprivation.  He continues to be on Lupron. Last given on 07/01/2015. This will be repeated in February 2017 5. 3.8 cm enhancing mass in the upper pole of the left kidney likely renal cell carcinoma. This will be monitored on future imaging studies. 6. Bone Directed therapy: He is currently on Xgeva last given on 07/01/2015 this will be repeated with the next visit in February 2017. 7. Pulmonary embolism: He is currently on Lovenox and will probably require lifetime anticoagulation. 8. Abdominal pain: Lower pelvic fullness related to his malignancy. He takes oxycodone with relief of the pain. This was refilled for him today. 9. Follow-up: Will be in 6 weeks.   [facility], MD 1/5/20179:44 AM

## 2015-10-21 NOTE — Patient Instructions (Addendum)
[name].  10/21/2015   Your procedure is scheduled on: 10-27-15  Monday  Report to [facility] Main  Entrance take [facility]  elevators to 3rd floor to  [place] at  [place]  AM.  Call this number if you have problems the morning of surgery [phone]   Remember: ONLY 1 PERSON MAY GO WITH YOU TO SHORT STAY TO GET  READY MORNING OF [place].  Do not eat food or drink liquids :After Midnight.     Take these medicines the morning of surgery with A SIP OF WATER:  DO NOT TAKE ANY DIABETIC MEDICATIONS DAY OF YOUR SURGERY                               You may not have any metal on your body including hair pins and              piercings  Do not wear jewelry, make-up, lotions, powders or perfumes, deodorant             Do not wear nail polish.  Do not shave  48 hours prior to surgery.              Men may shave face and neck.   Do not bring valuables to the hospital. [place].  Contacts, dentures or bridgework may not be worn into surgery.  Leave suitcase in the car. After surgery it may be brought to your room.     Patients discharged the day of surgery will not be allowed to drive home.  Name and phone number of your driver: Wayne Both [phone] cell  Special Instructions: N/A              Please read over the following fact sheets you were given: _____________________________________________________________________             [facility] - Preparing for Surgery Before surgery, you can play an important role.  Because skin is not sterile, your skin needs to be as free of germs as possible.  You can reduce the number of germs on your skin by washing with CHG (chlorahexidine gluconate) soap before surgery.  CHG is an antiseptic cleaner which kills germs and bonds with the skin to continue killing germs even after washing. Please DO NOT use if you have an allergy to CHG or antibacterial soaps.   If your skin becomes reddened/irritated stop using the CHG and inform your nurse when you arrive at Short Stay. Do not shave (including legs and underarms) for at least 48 hours prior to the first CHG shower.  You may shave your face/neck. Please follow these instructions carefully:  1.  Shower with CHG Soap the night before surgery and the  morning of Surgery.  2.  If you choose to wash your hair, wash your hair first as usual with your  normal  shampoo.  3.  After you shampoo, rinse your hair and body thoroughly to remove the  shampoo.                           4.  Use CHG as you would any other liquid soap.  You can apply chg directly  to the skin and wash                       Gently with a scrungie or clean washcloth.  5.  Apply the CHG Soap to your body ONLY FROM THE NECK DOWN.   Do not use on face/ open                           Wound or open sores. Avoid contact with eyes, ears mouth and genitals (private parts).                       Wash face,  Genitals (private parts) with your normal soap.             6.  Wash thoroughly, paying special attention to the area where your surgery  will be performed.  7.  Thoroughly rinse your body with warm water from the neck down.  8.  DO NOT shower/wash with your normal soap after using and rinsing off  the CHG Soap.                9.  Pat yourself dry with a clean towel.            10.  Wear clean pajamas.            11.  Place clean sheets on your bed the night of your first shower and do not  sleep with pets. Day of Surgery : Do not apply any lotions/deodorants the morning of surgery.  Please wear clean clothes to the hospital/surgery center.  FAILURE TO FOLLOW THESE INSTRUCTIONS MAY RESULT IN THE CANCELLATION OF YOUR SURGERY PATIENT SIGNATURE_________________________________  NURSE SIGNATURE__________________________________  ________________________________________________________________________

## 2015-10-22 ENCOUNTER — Encounter (HOSPITAL_COMMUNITY): Payer: Self-pay

## 2015-10-22 ENCOUNTER — Encounter (HOSPITAL_COMMUNITY)
Admission: RE | Admit: 2015-10-22 | Discharge: 2015-10-22 | Disposition: A | Discharge status: Home / Self Care | Payer: Medicare Other | Source: Ambulatory Visit | Attending: Urology | Admitting: Urology

## 2015-10-22 DIAGNOSIS — N1330 Unspecified hydronephrosis: Secondary | ICD-10-CM | POA: Diagnosis not present

## 2015-10-22 DIAGNOSIS — Z01818 Encounter for other preprocedural examination: Secondary | ICD-10-CM | POA: Diagnosis not present

## 2015-10-22 HISTORY — DX: Presence of other vascular implants and grafts: Z95.828

## 2015-10-22 NOTE — Pre-Procedure Instructions (Signed)
EKG/ Echo 9'16, CXR /CT Chest 9'16. Labs 10-10-15 CBC/d,CMP, PSA -Epic.

## 2015-10-26 NOTE — Anesthesia Preprocedure Evaluation (Addendum)
Anesthesia Evaluation  Patient identified by MRN, date of birth, ID band Patient awake    Reviewed: Allergy & Precautions, H&P , NPO status , Patient's Chart, lab work & pertinent test results  Airway Mallampati: II  TM Distance: >3 FB Neck ROM: Full    Dental no notable dental hx. (+) Teeth Intact, Dental Advisory Given   Pulmonary sleep apnea and Continuous Positive Airway Pressure Ventilation ,    Pulmonary exam normal breath sounds clear to auscultation       Cardiovascular hypertension,  Rhythm:Regular Rate:Normal     Neuro/Psych negative neurological ROS  negative psych ROS   GI/Hepatic negative GI ROS, Neg liver ROS,   Endo/Other  negative endocrine ROS  Renal/GU   negative genitourinary   Musculoskeletal  (+) Arthritis ,   Abdominal   Peds  Hematology negative hematology ROS (+)   Anesthesia Other Findings   Reproductive/Obstetrics negative OB ROS                            Anesthesia Physical Anesthesia Plan  ASA: III  Anesthesia Plan: General   Post-op Pain Management:    Induction: Intravenous  Airway Management Planned: LMA  Additional Equipment:   Intra-op Plan:   Post-operative Plan: Extubation in OR  Informed Consent: I have reviewed the patients History and Physical, chart, labs and discussed the procedure including the risks, benefits and alternatives for the proposed anesthesia with the patient or authorized representative who has indicated his/her understanding and acceptance.   Dental advisory given  Plan Discussed with: CRNA  Anesthesia Plan Comments:         Anesthesia Quick Evaluation

## 2015-10-26 NOTE — H&P (Signed)
Urology History and Physical Exam  CC: Blocked right kidney  HPI: 70 year old male presents for cystoscopy and exchange of a right ureteral stent. 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  PMH: Past Medical History  Diagnosis Date  . Hypertension   . Osteoarthritis   . Retroperitoneal lymphadenopathy   . History of pulmonary embolus (PE)     dec 2009  . History of DVT (deep vein thrombosis)     dec 2009  . OSA on CPAP   . History of acute renal failure     W/ SEPSIS  MARCH 2014  . Urgency of urination   . ED (erectile dysfunction)   . Left renal mass     MONITORED BY UROLOGIST-  DR Diona Fanti  . Hydronephrosis, right   . Fatigue   . Chemotherapy-induced nausea   . Prostate cancer ([place]) ONCOLOGIST-  DR SHADAD    DX 2001 (T3 N1)-- S/P PROSTATECTOMY--/ BIOCHEMICAL RELAPSE, RECEIVED PROSTATIC BED RADIATION AND LUPRON WITH CASODEX   SINCE DEVELOPED  CASTATION RESISITENT METASTATIC PROSTATE ADENOCARINOMA--  CURRENT THERAPY CHEMOTHEAPY EVERY 3 WEEKS AND LUPRON INJECTIONS EVERY 4 MONTHS  . Metastasis to bone [facility])     PRIMARY PROSTATE CANCER- remains with oral Chemotherapy at present  . Port-a-cath in place     right chest-"Power port"    PSH: Past Surgical History  Procedure Laterality Date  . Total knee arthroplasty Bilateral RIGHT  04-24-2009/   LEFT  2005  . Insertion ivc filter and removal  03-25-2009/   REMOVAL 05-29-2009  . Prostatectomy  MAY 2001  . Transthoracic echocardiogram  12-27-2012    MILD LVH/  EF [id number]  GRADE I DIASTOLIC DYSFUNCTION  . Inguinal hernia repair Right 2010  . Penile prosthesis implant  2005  . Cystoscopy w/ ureteral stent placement Right 10/22/2013    Procedure: CYSTOSCOPY WITH STENT REPLACEMENT;  Surgeon: Franchot Gallo, MD;  Location: [facility];  Service: Urology;  Laterality: Right;  . Portacath placement  02-06-2014  . Colonoscopy w/  polypectomy  05-23-2013  . Cystoscopy w/ ureteral stent placement Right 06/24/2014    Procedure: CYSTOSCOPY WITH RETROGRADE PYELOGRAM/ URETERAL STENT REPLACEMENT WITH URETERAL DILATION, RIGHT;  Surgeon: Jorja Loa, MD;  Location: [facility];  Service: Urology;  Laterality: Right;  . Cystoscopy w/ ureteral stent placement Right 11/22/2014    Procedure: CYSTOSCOPY WITH STENT REPLACEMENT;  Surgeon: Jorja Loa, MD;  Location: [facility];  Service: Urology;  Laterality: Right;    Allergies: No Known Allergies  Medications: No prescriptions prior to admission     Social History: Social History   Social History  . Marital Status: Married    Spouse Name: N/A  . Number of Children: N/A  . Years of Education: N/A   Occupational History  . Not on file.   Social History Main Topics  . Smoking status: Never Smoker   . Smokeless tobacco: Never Used  . Alcohol Use: 3.6 oz/week    6 Cans of beer per week     Comment: none now.  . Drug Use: No  . Sexual Activity: Not on file   Other Topics Concern  . Not on file   Social History Narrative    Family History: Family History  Problem Relation Age of Onset  . Colon cancer Neg Hx   . Heart disease Mother     Review of Systems: Positive: Occasional hematuria Negative:  A further 10 point review of systems was negative except what is listed in the HPI.                  Physical Exam: @VITALS2 @ General: No acute distress.  Awake. Head:  Normocephalic.  Atraumatic. ENT:  EOMI.  Mucous membranes moist Neck:  Supple.  No lymphadenopathy. CV:  S1 present. S2 present. Regular rate. Pulmonary: Equal effort bilaterally.  Clear to auscultation bilaterally. Abdomen: Soft.  Nontender to palpation. Skin:  Normal turgor.  No visible rash. Extremity: No gross deformity of bilateral upper extremities.  No gross deformity of                             lower extremities. Neurologic: Alert.  Appropriate mood.   Studies:  No results for input(s): HGB, WBC, PLT in the last 72 hours.  No results for input(s): NA, K, CL, CO2, BUN, CREATININE, CALCIUM, GFRNONAA, GFRAA in the last 72 hours.  Invalid input(s): MAGNESIUM   No results for input(s): INR, APTT in the last 72 hours.  Invalid input(s): PT   Invalid input(s): ABG    Assessment:  Right ureteral obstruction/hydronephrosis, s/p stent placement  Plan: Cystoscopy, right J2 stent exchange

## 2015-10-27 ENCOUNTER — Encounter (HOSPITAL_COMMUNITY)
Admission: RE | Disposition: A | Discharge status: Home / Self Care | Payer: Self-pay | Source: Ambulatory Visit | Attending: Urology

## 2015-10-27 ENCOUNTER — Encounter (HOSPITAL_COMMUNITY): Payer: Self-pay | Admitting: *Deleted

## 2015-10-27 ENCOUNTER — Ambulatory Visit (HOSPITAL_COMMUNITY): Payer: Medicare Other | Admitting: Anesthesiology

## 2015-10-27 ENCOUNTER — Ambulatory Visit (HOSPITAL_COMMUNITY)
Admission: RE | Admit: 2015-10-27 | Discharge: 2015-10-27 | Disposition: A | Discharge status: Home / Self Care | Payer: Medicare Other | Source: Ambulatory Visit | Attending: Urology | Admitting: Urology

## 2015-10-27 DIAGNOSIS — C7951 Secondary malignant neoplasm of bone: Secondary | ICD-10-CM | POA: Insufficient documentation

## 2015-10-27 DIAGNOSIS — N131 Hydronephrosis with ureteral stricture, not elsewhere classified: Secondary | ICD-10-CM | POA: Insufficient documentation

## 2015-10-27 DIAGNOSIS — I10 Essential (primary) hypertension: Secondary | ICD-10-CM | POA: Diagnosis not present

## 2015-10-27 DIAGNOSIS — Z9221 Personal history of antineoplastic chemotherapy: Secondary | ICD-10-CM | POA: Insufficient documentation

## 2015-10-27 DIAGNOSIS — Z9989 Dependence on other enabling machines and devices: Secondary | ICD-10-CM | POA: Diagnosis not present

## 2015-10-27 DIAGNOSIS — Z8546 Personal history of malignant neoplasm of prostate: Secondary | ICD-10-CM | POA: Diagnosis not present

## 2015-10-27 DIAGNOSIS — M1990 Unspecified osteoarthritis, unspecified site: Secondary | ICD-10-CM | POA: Insufficient documentation

## 2015-10-27 DIAGNOSIS — Z86718 Personal history of other venous thrombosis and embolism: Secondary | ICD-10-CM | POA: Diagnosis not present

## 2015-10-27 DIAGNOSIS — G4733 Obstructive sleep apnea (adult) (pediatric): Secondary | ICD-10-CM | POA: Diagnosis not present

## 2015-10-27 DIAGNOSIS — Z96653 Presence of artificial knee joint, bilateral: Secondary | ICD-10-CM | POA: Insufficient documentation

## 2015-10-27 DIAGNOSIS — Z86711 Personal history of pulmonary embolism: Secondary | ICD-10-CM | POA: Diagnosis not present

## 2015-10-27 HISTORY — PX: CYSTOSCOPY WITH URETEROSCOPY AND STENT PLACEMENT: SHX6377

## 2015-10-27 LAB — PROTIME-INR
INR: 1.08 (ref 0.00–1.49)
Prothrombin Time: 14.2 seconds (ref 11.6–15.2)

## 2015-10-27 SURGERY — CYSTOURETEROSCOPY, WITH STENT INSERTION
Anesthesia: General | Laterality: Right

## 2015-10-27 MED ORDER — PROPOFOL 10 MG/ML IV BOLUS
INTRAVENOUS | Status: DC | PRN
  Administered 2015-10-27: 170 mg via INTRAVENOUS

## 2015-10-27 MED ORDER — MIDAZOLAM HCL 5 MG/5ML IJ SOLN
INTRAMUSCULAR | Status: DC | PRN
  Administered 2015-10-27: 2 mg via INTRAVENOUS

## 2015-10-27 MED ORDER — DEXAMETHASONE SODIUM PHOSPHATE 10 MG/ML IJ SOLN
INTRAMUSCULAR | Status: DC | PRN
  Administered 2015-10-27: 5 mg via INTRAVENOUS

## 2015-10-27 MED ORDER — MIDAZOLAM HCL 2 MG/2ML IJ SOLN
INTRAMUSCULAR | Status: AC
  Filled 2015-10-27: qty 2

## 2015-10-27 MED ORDER — CEFAZOLIN SODIUM-DEXTROSE 2-3 GM-% IV SOLR
INTRAVENOUS | Status: AC
  Filled 2015-10-27: qty 50

## 2015-10-27 MED ORDER — LIDOCAINE HCL (CARDIAC) 20 MG/ML IV SOLN
INTRAVENOUS | Status: DC | PRN
  Administered 2015-10-27: 80 mg via INTRAVENOUS

## 2015-10-27 MED ORDER — SULFAMETHOXAZOLE-TRIMETHOPRIM 800-160 MG PO TABS: 1.0000 | ORAL_TABLET | Freq: Two times a day (BID) | ORAL | Status: DC

## 2015-10-27 MED ORDER — LIDOCAINE HCL (CARDIAC) 20 MG/ML IV SOLN
INTRAVENOUS | Status: AC
  Filled 2015-10-27: qty 5

## 2015-10-27 MED ORDER — DEXAMETHASONE SODIUM PHOSPHATE 10 MG/ML IJ SOLN
INTRAMUSCULAR | Status: AC
  Filled 2015-10-27: qty 1

## 2015-10-27 MED ORDER — LACTATED RINGERS IV SOLN
INTRAVENOUS | Status: DC | PRN
  Administered 2015-10-27: 07:00:00 via INTRAVENOUS

## 2015-10-27 MED ORDER — FENTANYL CITRATE (PF) 100 MCG/2ML IJ SOLN
INTRAMUSCULAR | Status: AC
  Filled 2015-10-27: qty 2

## 2015-10-27 MED ORDER — ONDANSETRON HCL 4 MG/2ML IJ SOLN
INTRAMUSCULAR | Status: AC
  Filled 2015-10-27: qty 4

## 2015-10-27 MED ORDER — PROPOFOL 10 MG/ML IV BOLUS
INTRAVENOUS | Status: AC
  Filled 2015-10-27: qty 40

## 2015-10-27 MED ORDER — ONDANSETRON HCL 4 MG/2ML IJ SOLN
INTRAMUSCULAR | Status: DC | PRN
  Administered 2015-10-27 (×2): 4 mg via INTRAVENOUS

## 2015-10-27 MED ORDER — SODIUM CHLORIDE 0.9 % IR SOLN
Status: DC | PRN
  Administered 2015-10-27: 3000 mL via INTRAVESICAL

## 2015-10-27 MED ORDER — FENTANYL CITRATE (PF) 100 MCG/2ML IJ SOLN
INTRAMUSCULAR | Status: DC | PRN
  Administered 2015-10-27 (×2): 25 ug via INTRAVENOUS
  Administered 2015-10-27: 50 ug via INTRAVENOUS

## 2015-10-27 MED ORDER — CEFAZOLIN SODIUM-DEXTROSE 2-3 GM-% IV SOLR
2.0000 g | INTRAVENOUS | Status: AC
  Administered 2015-10-27: 2 g via INTRAVENOUS

## 2015-10-27 MED ORDER — HYDROMORPHONE HCL 1 MG/ML IJ SOLN: 0.2500 mg | INTRAMUSCULAR | Status: DC | PRN

## 2015-10-27 SURGICAL SUPPLY — 17 items
BAG URO CATCHER STRL LF (MISCELLANEOUS) ×3 IMPLANT
CATH FOLEY 2WAY SLVR  5CC 16FR (CATHETERS) ×2
CATH FOLEY 2WAY SLVR 5CC 16FR (CATHETERS) ×1 IMPLANT
CATH URET 5FR 28IN CONE TIP (BALLOONS) ×2
CATH URET 5FR 70CM CONE TIP (BALLOONS) ×1 IMPLANT
CLOTH BEACON ORANGE TIMEOUT ST (SAFETY) ×3 IMPLANT
GLOVE BIOGEL M 8.0 STRL (GLOVE) ×3 IMPLANT
GOWN STRL REUS W/ TWL XL LVL3 (GOWN DISPOSABLE) ×1 IMPLANT
GOWN STRL REUS W/TWL XL LVL3 (GOWN DISPOSABLE) ×5 IMPLANT
GUIDEWIRE STR DUAL SENSOR (WIRE) ×3 IMPLANT
MANIFOLD NEPTUNE II (INSTRUMENTS) ×3 IMPLANT
PACK CYSTO (CUSTOM PROCEDURE TRAY) ×3 IMPLANT
SHEATH ACCESS URETERAL 38CM (SHEATH) ×3 IMPLANT
STENT CONTOUR 8FR X 24 (STENTS) ×3 IMPLANT
TUBING CONNECTING 10 (TUBING) ×2 IMPLANT
TUBING CONNECTING 10' (TUBING) ×1
WIRE COONS/BENSON .038X145CM (WIRE) ×3 IMPLANT

## 2015-10-27 NOTE — Anesthesia Postprocedure Evaluation (Signed)
Anesthesia Post Note  Patient: [name].  Procedure(s) Performed: Procedure(s) (LRB): CYSTOSCOPY AND RIGHT STENT PLACEMENT (Right)  Patient location during evaluation: PACU Anesthesia Type: General Level of consciousness: awake and alert Pain management: pain level controlled Vital Signs Assessment: post-procedure vital signs reviewed and stable Respiratory status: spontaneous breathing, nonlabored ventilation and respiratory function stable Cardiovascular status: blood pressure returned to baseline and stable Postop Assessment: no signs of nausea or vomiting Anesthetic complications: no    Last Vitals:  Filed Vitals:   10/27/15 0845 10/27/15 0900  BP: 159/85 164/86  Pulse: 58 60  Temp:  36.7 C  Resp: 17 16    Last Pain:  Filed Vitals:   10/27/15 0905  PainSc: 0-No pain                 Carletta Feasel,W. EDMOND

## 2015-10-27 NOTE — Discharge Instructions (Signed)

## 2015-10-27 NOTE — Anesthesia Procedure Notes (Addendum)
Procedure Name: LMA Insertion Date/Time: 10/27/2015 7:53 AM Performed by: Freddie Breech Pre-anesthesia Checklist: Patient identified, Emergency Drugs available, Suction available, Patient being monitored and Timeout performed Patient Re-evaluated:Patient Re-evaluated prior to inductionOxygen Delivery Method: Circle system utilized Preoxygenation: Pre-oxygenation with 100% oxygen Intubation Type: IV induction LMA: LMA inserted LMA Size: 5.0 Number of attempts: 1 Airway Equipment and Method: Patient positioned with wedge pillow Placement Confirmation: positive ETCO2,  CO2 detector and breath sounds checked- equal and bilateral Tube secured with: Tape Dental Injury: Teeth and Oropharynx as per pre-operative assessment

## 2015-10-27 NOTE — Op Note (Signed)
PATIENT:  [name].  PRE-OPERATIVE DIAGNOSIS: Right-sided hydronephrosis secondary to malignant adenopathy secondary to prostate cancer  POST-OPERATIVE  DIAGNOSIS: Same  PROCEDURE: Cystoscopy, right double-J stent exchange (8 [place] by 24 cm contour without string)  SURGEON:  [name]. Irmalee Riemenschneider, [name]  ANESTHESIA:  General  EBL:  Minimal  LOCAL MEDICATIONS USED:  None  SPECIMEN:  None  INDICATION: [name]. is a 70 year old male with progressive, metastatic prostate cancer with adenopathy in his retroperitoneum. He has had hydronephrosis, and this is managed with a double-J stent. His last double-J stent change was proximally 1 year ago. He presents now for double-J stent exchange.  Description of procedure: The patient was properly identified and marked (if applicable) in the holding area. They were then  taken to the operating room and placed on the table in a supine position. General anesthesia was then administered. Once fully anesthetized the patient was moved to the dorsolithotomy position and the genitalia and perineum were sterilely prepped and draped in standard fashion. An official timeout was then performed.  His inflatable penile prosthesis was deflated. A 23 French panendoscope was advanced under direct vision through his urethra. There was a false passage posteriorly in the bulbous urethra. The bladder neck was very high riding, and it was somewhat difficult to get into the bladder. However, this was performed with a minor degree of difficulty. Cursory examination the bladder revealed normal findings except for stent in the right ureteral orifice. There were no encrustations on this. It was grasped, and brought out through the urethral meatus. I was unable to thread a 0.038 inch sensor-tip guidewire through this. I then removed the stent. The scope was replaced, and the sensor-tip guidewire was advanced into the right ureteral orifice, and using fluoroscopic  guidance guided into the upper pole calyces of the kidney. I then passed an 8 [place], 24 cm contour double-J stent over top of this, and once adequatly position, the guidewire was removed and the stent deployed. Excellent proximal distal curls were seen. Scope was then removed after the bladder was drained.  Patient tolerated the procedure well. He was then awakened and taken to PACU in stable condition.    PLAN OF CARE: Discharge to home after PACU  PATIENT DISPOSITION:  PACU - hemodynamically stable.

## 2015-10-27 NOTE — Transfer of Care (Signed)
Immediate Anesthesia Transfer of Care Note  Patient: [name].  Procedure(s) Performed: Procedure(s): CYSTOSCOPY AND RIGHT STENT PLACEMENT (Right)  Patient Location: PACU  Anesthesia Type:General  Level of Consciousness:  sedated, patient cooperative and responds to stimulation  Airway & Oxygen Therapy:Patient Spontanous Breathing and Patient connected to face mask oxgen  Post-op Assessment:  Report given to PACU RN and Post -op Vital signs reviewed and stable  Post vital signs:  Reviewed and stable  Last Vitals:  Filed Vitals:   10/27/15 0509  BP: 143/80  Pulse: 69  Temp: 36.6 C  Resp: 18    Complications: No apparent anesthesia complications

## 2015-12-02 ENCOUNTER — Ambulatory Visit (HOSPITAL_BASED_OUTPATIENT_CLINIC_OR_DEPARTMENT_OTHER): Payer: Medicare Other

## 2015-12-02 ENCOUNTER — Other Ambulatory Visit (HOSPITAL_BASED_OUTPATIENT_CLINIC_OR_DEPARTMENT_OTHER): Payer: Medicare Other

## 2015-12-02 VITALS — BP 126/71 | HR 69 | Temp 98.4°F | Resp 16

## 2015-12-02 DIAGNOSIS — Z95828 Presence of other vascular implants and grafts: Secondary | ICD-10-CM

## 2015-12-02 DIAGNOSIS — C61 Malignant neoplasm of prostate: Secondary | ICD-10-CM

## 2015-12-02 LAB — CBC WITH DIFFERENTIAL/PLATELET
BASO%: 0.6 % (ref 0.0–2.0)
BASOS ABS: 0 10*3/uL (ref 0.0–0.1)
EOS ABS: 0.1 10*3/uL (ref 0.0–0.5)
EOS%: 4.3 % (ref 0.0–7.0)
HEMATOCRIT: 28.5 % — AB (ref 38.4–49.9)
HEMOGLOBIN: 9.2 g/dL — AB (ref 13.0–17.1)
LYMPH#: 0.8 10*3/uL — AB (ref 0.9–3.3)
LYMPH%: 25.5 % (ref 14.0–49.0)
MCH: 29.4 pg (ref 27.2–33.4)
MCHC: 32.3 g/dL (ref 32.0–36.0)
MCV: 91.1 fL (ref 79.3–98.0)
MONO#: 0.3 10*3/uL (ref 0.1–0.9)
MONO%: 7.6 % (ref 0.0–14.0)
NEUT#: 2 10*3/uL (ref 1.5–6.5)
NEUT%: 62 % (ref 39.0–75.0)
NRBC: 2 % — AB (ref 0–0)
PLATELETS: 150 10*3/uL (ref 140–400)
RBC: 3.13 10*6/uL — ABNORMAL LOW (ref 4.20–5.82)
RDW: 19.8 % — AB (ref 11.0–14.6)
WBC: 3.3 10*3/uL — ABNORMAL LOW (ref 4.0–10.3)

## 2015-12-02 LAB — COMPREHENSIVE METABOLIC PANEL
ALBUMIN: 3.4 g/dL — AB (ref 3.5–5.0)
ALK PHOS: 32 U/L — AB (ref 40–150)
ALT: <9 U/L (ref 0–55)
ANION GAP: 8 meq/L (ref 3–11)
AST: 12 U/L (ref 5–34)
BUN: 15.3 mg/dL (ref 7.0–26.0)
CALCIUM: 8.9 mg/dL (ref 8.4–10.4)
CO2: 25 mEq/L (ref 22–29)
Chloride: 108 mEq/L (ref 98–109)
Creatinine: 1.2 mg/dL (ref 0.7–1.3)
EGFR: 68 mL/min/{1.73_m2} — AB (ref >90)
GLUCOSE: 107 mg/dL (ref 70–140)
Potassium: 4.3 mEq/L (ref 3.5–5.1)
Sodium: 141 mEq/L (ref 136–145)
TOTAL PROTEIN: 6.5 g/dL (ref 6.4–8.3)

## 2015-12-02 MED ORDER — SODIUM CHLORIDE 0.9% FLUSH
10.0000 mL | INTRAVENOUS | Status: DC | PRN
  Administered 2015-12-02: 10 mL via INTRAVENOUS
  Filled 2015-12-02: qty 10

## 2015-12-02 MED ORDER — HEPARIN SOD (PORK) LOCK FLUSH 100 UNIT/ML IV SOLN
500.0000 [IU] | Freq: Once | INTRAVENOUS | Status: AC
  Administered 2015-12-02: 500 [IU] via INTRAVENOUS
  Filled 2015-12-02: qty 5

## 2015-12-02 NOTE — Patient Instructions (Signed)

## 2015-12-03 ENCOUNTER — Other Ambulatory Visit: Payer: Self-pay | Admitting: Oncology

## 2015-12-03 LAB — PSA (PARALLEL TESTING): PSA: 1567 ng/mL — ABNORMAL HIGH (ref <=4.00)

## 2015-12-03 LAB — PSA: Prostate Specific Ag, Serum: 1529 ng/mL — ABNORMAL HIGH (ref 0.0–4.0)

## 2015-12-04 ENCOUNTER — Ambulatory Visit (HOSPITAL_BASED_OUTPATIENT_CLINIC_OR_DEPARTMENT_OTHER): Payer: Medicare Other

## 2015-12-04 ENCOUNTER — Ambulatory Visit (HOSPITAL_BASED_OUTPATIENT_CLINIC_OR_DEPARTMENT_OTHER): Payer: Medicare Other | Admitting: Oncology

## 2015-12-04 ENCOUNTER — Telehealth: Payer: Self-pay | Admitting: Oncology

## 2015-12-04 VITALS — BP 127/65 | HR 67 | Temp 98.0°F | Resp 18 | Ht 68.0 in | Wt 204.7 lb

## 2015-12-04 DIAGNOSIS — N1330 Unspecified hydronephrosis: Secondary | ICD-10-CM | POA: Diagnosis not present

## 2015-12-04 DIAGNOSIS — M545 Low back pain: Secondary | ICD-10-CM

## 2015-12-04 DIAGNOSIS — I2699 Other pulmonary embolism without acute cor pulmonale: Secondary | ICD-10-CM | POA: Diagnosis not present

## 2015-12-04 DIAGNOSIS — C61 Malignant neoplasm of prostate: Secondary | ICD-10-CM | POA: Diagnosis present

## 2015-12-04 DIAGNOSIS — C7951 Secondary malignant neoplasm of bone: Secondary | ICD-10-CM

## 2015-12-04 DIAGNOSIS — Z5111 Encounter for antineoplastic chemotherapy: Secondary | ICD-10-CM | POA: Diagnosis present

## 2015-12-04 DIAGNOSIS — E291 Testicular hypofunction: Secondary | ICD-10-CM

## 2015-12-04 MED ORDER — LEUPROLIDE ACETATE (4 MONTH) 30 MG IM KIT
30.0000 mg | PACK | Freq: Once | INTRAMUSCULAR | Status: AC
  Administered 2015-12-04: 30 mg via INTRAMUSCULAR
  Filled 2015-12-04: qty 30

## 2015-12-04 MED ORDER — OXYCODONE HCL 5 MG PO TABS: 5.0000 mg | ORAL_TABLET | ORAL | Status: DC | PRN

## 2015-12-04 MED ORDER — DENOSUMAB 120 MG/1.7ML ~~LOC~~ SOLN
120.0000 mg | Freq: Once | SUBCUTANEOUS | Status: AC
  Administered 2015-12-04: 120 mg via SUBCUTANEOUS
  Filled 2015-12-04: qty 1.7

## 2015-12-04 NOTE — Progress Notes (Signed)
Hematology and Oncology Follow Up Visit  Kit Deluca [id number] Dec 13, 1945 70 y.o. 12/04/2015 9:31 AM   [name], [name]  [name], [name]  Principle Diagnosis: This is a 70 year old gentleman with prostate cancer initially diagnosed in 2001.  He had a Gleason score of 3 + 3 = 6.  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     Prior Therapy:  1. Status post prostatectomy done in May 2001.  He had T3 N1 disease.  PSA nadir to 0.  2. The patient developed biochemical relapse and received prostatic bed radiation.  3. The patient received Lupron with Casodex due to rising PSA.  The patient subsequently developed castration-resistant disease. 4. Patient treated with Casodex withdrawal and subsequently PSA rose to 18. 5. Patient with Provenge immunotherapy completed in July 2011. 6.  He received ketoconazole and prednisone December 2011 through January 2014. 7.         He is S/P Zytiga 1000 mg daily with Prednisone 5 mg daily on 11/03/12 till 07/2013. This was stopped due to progression of disease.  8.  Xtandi 1000 mg daily 07/2013 through 01/24/2014. Discontinued secondary to disease progression 9.         Docetaxel 75 mg/m2 with Neulasta support, status post 5 cycles. Discontinued 06/05/2014 secondary to disease progression. 10.      Systemic chemotherapy with Jevtana 25 mg/m2 given every 3 weeks. This was dose reduced to 20 mg started on cycle 12. Status post 15 cycles. Therapy discontinued due to progression of disease.  Current therapy: 1. Lynparza (olaparib) 400 mg po BID. Therapy started around 05/04/2015. 2. He is on Lupron 30 mg every 4 months. This will be given on 12/04/2015 and repeated in 4 months. 3. Xgeva given every 6 weeks.This will be given on 12/04/2015 and repeated on 01/15/2016. 4. He is on Lovenox for full dose anticoagulation for pulmonary embolism. He is currently on 90 mg daily.  Interim History: [name]  presents today for a followup visit with his wife. Since the last visit, he continues to be stable. He does not report any new complaints at this time. He has not reported worsening abdominal pain or back pain but does report intermittent pain in his lower back and pelvic area. This has not changed in intensity or duration and usually ranges between 2 or 3 out of 10. He takes oxycodone and ibuprofen at times with excellent relief.  He continues on Lovenox and have tolerated it well. He is no longer reporting any bleeding symptoms including hematochezia or melena. Has not reported any recent thrombosis episodes. He is breathing comfortably without any dyspnea on exertion. His performance status remains reasonable and still works part-time 3 days a week.  He continues taking Olaparib and have tolerated it well. He denied any side effects associated with this medication so far. His quality of life is not dramatically changed. His appetite is slightly down at home lost some more weight.  He does not report any headaches, blurry vision, syncope or seizures. He does report some occasional lightheadedness. He does not report any chest pain, palpitation or orthopnea. He does not report any cough, hemoptysis or hematemesis. Does not report any constipation, diarrhea or early satiety. He does not report any arthralgias or myalgias. He does not report any back pain or shoulder pain. He does not report any lymphadenopathy or petechiae. Remainder of his review of systems unremarkable.     Medications: I have reviewed the  patient's current medications. Current Outpatient Prescriptions  Medication Sig Dispense Refill  . brimonidine (ALPHAGAN) 0.15 % ophthalmic solution Place 1 drop into the left eye every 2 (two) hours while awake.     . calcium carbonate (OS-CAL) 1250 MG chewable tablet Chew 1 tablet by mouth 2 (two) times daily.     [name] enoxaparin (LOVENOX) 100 MG/ML injection Inject 0.9 mLs (90 mg total) into the  skin daily. (Patient taking differently: Inject 90 mg into the skin every 12 (twelve) hours. ) 60 Syringe 1  . ketorolac (ACULAR) 0.5 % ophthalmic solution Place 1 drop into the left eye 4 (four) times daily.     [name] leuprolide (LUPRON) 30 MG injection Inject 30 mg into the muscle every 4 (four) months.     . lidocaine-prilocaine (EMLA) cream Apply topically as needed. (Patient taking differently: Apply 1 application topically as needed (for port). ) 1 g 2  . loratadine (CLARITIN) 10 MG tablet Take 10 mg by mouth daily.    [name] ofloxacin (OCUFLOX) 0.3 % ophthalmic solution Place 1 drop into the left eye 4 (four) times daily.     [name] olaparib (LYNPARZA) 50 MG capsule Take 400 mg by mouth 2 (two) times daily. Swallow whole.    . ondansetron (ZOFRAN) 8 MG tablet Take 1 tablet (8 mg total) by mouth every 8 (eight) hours as needed for nausea or vomiting. 20 tablet 3  . oxyCODONE (OXY IR/ROXICODONE) 5 MG immediate release tablet Take 1 tablet (5 mg total) by mouth every 4 (four) hours as needed for severe pain. 60 tablet 0  . prednisoLONE acetate (PRED FORTE) 1 % ophthalmic suspension Place 1 drop into the left eye every 2 (two) hours while awake.     . sulfamethoxazole-trimethoprim (BACTRIM DS,SEPTRA DS) 800-160 MG tablet Take 1 tablet by mouth 2 (two) times daily. 6 tablet 0   No current facility-administered medications for this visit.   Facility-Administered Medications Ordered in Other Visits  Medication Dose Route Frequency Provider Last Rate Last Dose  . denosumab (XGEVA) injection 120 mg  120 mg Subcutaneous Once [name], MD      . leuprolide (LUPRON) injection 30 mg  30 mg Intramuscular Once Maryanna Shape, NP        Allergies: No Known Allergies  Past Medical History, Surgical history, Social history, and Family History were reviewed and updated.    Physical Exam: Blood pressure 127/65, pulse 67, temperature 98 F (36.7 C), temperature source Oral, resp. rate 18, height 5\' 8"  (1.727  m), weight 204 lb 11.2 oz (92.851 kg), SpO2 100 %. ECOG: 1 General appearance: alert awake appeared without distress. Head: Normocephalic, without obvious abnormality no oral ulcers or lesions. Neck: no adenopathy Lymph nodes: Cervical, supraclavicular, and axillary nodes normal. Heart:regular rate and rhythm, S1, S2 normal, no murmur, click, rub or gallop Lung:chest clear, no wheezing, rales. Decreased breath sounds at the bases bilaterally. Chest wall examination: Right anterior chest porta cath without erythema or induration. Abdomen: soft, non-tender, without masses or organomegaly or rebound or guarding. EXT: 2+ lower 70 edema bilaterally.   Lab Results: Lab Results  Component Value Date   WBC 3.3* 12/02/2015   HGB 9.2* 12/02/2015   HCT 28.5* 12/02/2015   MCV 91.1 12/02/2015   PLT 150 12/02/2015     Chemistry      Component Value Date/Time   NA 141 12/02/2015 1054   NA 142 07/05/2015 0135   K 4.3 12/02/2015 1054   K 4.0 07/05/2015  0135   CL 110 07/05/2015 0135   CL 108* 03/09/2013 0923   CO2 25 12/02/2015 1054   CO2 24 07/05/2015 0135   BUN 15.3 12/02/2015 1054   BUN 19 07/05/2015 0135   CREATININE 1.2 12/02/2015 1054   CREATININE 1.40* 07/05/2015 0135      Component Value Date/Time   CALCIUM 8.9 12/02/2015 1054   CALCIUM 8.1* 07/05/2015 0135   ALKPHOS 32* 12/02/2015 1054   ALKPHOS 38 07/05/2015 0135   AST 12 12/02/2015 1054   AST 22 07/05/2015 0135   ALT <9 12/02/2015 1054   ALT 21 07/05/2015 0135   BILITOT <0.30 12/02/2015 1054   BILITOT 0.4 07/05/2015 0135       Results for ELII, OSCARSON (MRN [id number]) as of 12/04/2015 09:18  Ref. Range 08/11/2015 09:26 10/10/2015 09:45 12/02/2015 10:54  PSA Latest Ref Range: <=4.00 ng/mL 1391.00 (H) 1410.00 (H) 1567.00 (H)      Impression and Plan:  This is a 70 year old gentleman with the following issues: 1. Castration-resistant prostate cancer with pelvic adenopathy. He has progressed on multiple  therapies discussed above. His Foundation Medicine testing [facility] showed that he might respond to a PARP inhibitor Lonie Peak- olaparib). He has been on this medication  since July 2016 and tolerated it well. His PSA have increased slightly in the last 4 months up to 1500. He does have a repeat CT scan scheduled in the near future at [facility]. If his disease continued to be stable, I think you will be reasonable to continue on this current medication. If his disease has progressed, different therapy will be needed unlikely involvement in a clinical trial. He had exhausted all approved therapy at this time. 2. IV access: Status post right anterior chest Port-A-Cath placement on 02/06/2014.This will be flushed every 6 weeks. His next dose will be scheduled for April 2017. 3. Hydronephrosis: He is S/P stent exchange done on 10/27/2015. 4. Hormonal deprivation.  He continues to be on Lupron. Last given on 07/01/2015. This will be repeated in 12/04/2015. 5. 3.8 cm enhancing mass in the upper pole of the left kidney likely renal cell carcinoma. This will be monitored on future imaging studies. 6. Bone Directed therapy: He is currently on Xgeva and he will receive that on 12/04/2015. This will be repeated in 6 weeks. 7. Pulmonary embolism: He is currently on Lovenox and will probably require lifetime anticoagulation. 8. Abdominal pain: Lower pelvic fullness related to his malignancy. He takes oxycodone with relief of the pain. This was refilled for him today. 9. Follow-up: Will be in 6 weeks.   [facility], MD 2/23/20179:31 AM

## 2015-12-04 NOTE — Telephone Encounter (Signed)
Pt confirmed labs/ov/flush/inj per 02/23 POF, gave pt AVS and Calendar... KJ

## 2015-12-15 ENCOUNTER — Encounter (INDEPENDENT_AMBULATORY_CARE_PROVIDER_SITE_OTHER): Payer: Medicare Other | Admitting: Ophthalmology

## 2015-12-15 DIAGNOSIS — E113211 Type 2 diabetes mellitus with mild nonproliferative diabetic retinopathy with macular edema, right eye: Secondary | ICD-10-CM

## 2015-12-15 DIAGNOSIS — E113292 Type 2 diabetes mellitus with mild nonproliferative diabetic retinopathy without macular edema, left eye: Secondary | ICD-10-CM | POA: Diagnosis not present

## 2015-12-15 DIAGNOSIS — E11311 Type 2 diabetes mellitus with unspecified diabetic retinopathy with macular edema: Secondary | ICD-10-CM | POA: Diagnosis not present

## 2015-12-15 DIAGNOSIS — H59031 Cystoid macular edema following cataract surgery, right eye: Secondary | ICD-10-CM | POA: Diagnosis not present

## 2015-12-15 DIAGNOSIS — H43813 Vitreous degeneration, bilateral: Secondary | ICD-10-CM

## 2016-01-13 ENCOUNTER — Other Ambulatory Visit (HOSPITAL_BASED_OUTPATIENT_CLINIC_OR_DEPARTMENT_OTHER): Payer: Medicare Other

## 2016-01-13 ENCOUNTER — Ambulatory Visit (HOSPITAL_BASED_OUTPATIENT_CLINIC_OR_DEPARTMENT_OTHER): Payer: Medicare Other

## 2016-01-13 DIAGNOSIS — C7951 Secondary malignant neoplasm of bone: Secondary | ICD-10-CM | POA: Diagnosis not present

## 2016-01-13 DIAGNOSIS — Z95828 Presence of other vascular implants and grafts: Secondary | ICD-10-CM

## 2016-01-13 DIAGNOSIS — C61 Malignant neoplasm of prostate: Secondary | ICD-10-CM

## 2016-01-13 LAB — COMPREHENSIVE METABOLIC PANEL
ANION GAP: 7 meq/L (ref 3–11)
AST: 12 U/L (ref 5–34)
Albumin: 3.4 g/dL — ABNORMAL LOW (ref 3.5–5.0)
Alkaline Phosphatase: 31 U/L — ABNORMAL LOW (ref 40–150)
BUN: 10.7 mg/dL (ref 7.0–26.0)
CALCIUM: 8.9 mg/dL (ref 8.4–10.4)
CHLORIDE: 111 meq/L — AB (ref 98–109)
CO2: 25 mEq/L (ref 22–29)
CREATININE: 1.1 mg/dL (ref 0.7–1.3)
EGFR: 79 mL/min/{1.73_m2} — AB (ref >90)
Glucose: 97 mg/dl (ref 70–140)
POTASSIUM: 4.7 meq/L (ref 3.5–5.1)
Sodium: 143 mEq/L (ref 136–145)
Total Bilirubin: 0.3 mg/dL (ref 0.20–1.20)
Total Protein: 6.7 g/dL (ref 6.4–8.3)

## 2016-01-13 LAB — TECHNOLOGIST REVIEW

## 2016-01-13 LAB — CBC WITH DIFFERENTIAL/PLATELET
BASO%: 0.7 % (ref 0.0–2.0)
Basophils Absolute: 0 10*3/uL (ref 0.0–0.1)
EOS ABS: 0.1 10*3/uL (ref 0.0–0.5)
EOS%: 3.9 % (ref 0.0–7.0)
HEMATOCRIT: 27.3 % — AB (ref 38.4–49.9)
HGB: 8.8 g/dL — ABNORMAL LOW (ref 13.0–17.1)
LYMPH%: 29.9 % (ref 14.0–49.0)
MCH: 29.9 pg (ref 27.2–33.4)
MCHC: 32.2 g/dL (ref 32.0–36.0)
MCV: 92.9 fL (ref 79.3–98.0)
MONO#: 0.2 10*3/uL (ref 0.1–0.9)
MONO%: 7.6 % (ref 0.0–14.0)
NEUT#: 1.8 10*3/uL (ref 1.5–6.5)
NEUT%: 57.9 % (ref 39.0–75.0)
PLATELETS: 139 10*3/uL — AB (ref 140–400)
RBC: 2.94 10*6/uL — ABNORMAL LOW (ref 4.20–5.82)
RDW: 20.1 % — ABNORMAL HIGH (ref 11.0–14.6)
WBC: 3 10*3/uL — ABNORMAL LOW (ref 4.0–10.3)
lymph#: 0.9 10*3/uL (ref 0.9–3.3)
nRBC: 3 % — ABNORMAL HIGH (ref 0–0)

## 2016-01-13 MED ORDER — HEPARIN SOD (PORK) LOCK FLUSH 100 UNIT/ML IV SOLN
500.0000 [IU] | Freq: Once | INTRAVENOUS | Status: AC
  Administered 2016-01-13: 500 [IU] via INTRAVENOUS
  Filled 2016-01-13: qty 5

## 2016-01-13 MED ORDER — SODIUM CHLORIDE 0.9% FLUSH
10.0000 mL | INTRAVENOUS | Status: DC | PRN
  Administered 2016-01-13: 10 mL via INTRAVENOUS
  Filled 2016-01-13: qty 10

## 2016-01-13 NOTE — Patient Instructions (Signed)

## 2016-01-14 LAB — PSA: Prostate Specific Ag, Serum: 1888 ng/mL — ABNORMAL HIGH (ref 0.0–4.0)

## 2016-01-14 LAB — PSA (PARALLEL TESTING): PSA: 1813 ng/mL — ABNORMAL HIGH (ref <=4.00)

## 2016-01-15 ENCOUNTER — Ambulatory Visit (HOSPITAL_BASED_OUTPATIENT_CLINIC_OR_DEPARTMENT_OTHER): Payer: Medicare Other

## 2016-01-15 ENCOUNTER — Telehealth: Payer: Self-pay | Admitting: Oncology

## 2016-01-15 ENCOUNTER — Other Ambulatory Visit: Payer: Self-pay | Admitting: Oncology

## 2016-01-15 ENCOUNTER — Ambulatory Visit (HOSPITAL_BASED_OUTPATIENT_CLINIC_OR_DEPARTMENT_OTHER): Payer: Medicare Other | Admitting: Oncology

## 2016-01-15 VITALS — BP 131/64 | HR 70 | Temp 98.4°F | Resp 18 | Wt 212.0 lb

## 2016-01-15 DIAGNOSIS — E291 Testicular hypofunction: Secondary | ICD-10-CM

## 2016-01-15 DIAGNOSIS — C61 Malignant neoplasm of prostate: Secondary | ICD-10-CM

## 2016-01-15 DIAGNOSIS — C7951 Secondary malignant neoplasm of bone: Secondary | ICD-10-CM

## 2016-01-15 DIAGNOSIS — N5089 Other specified disorders of the male genital organs: Secondary | ICD-10-CM | POA: Diagnosis not present

## 2016-01-15 DIAGNOSIS — I2699 Other pulmonary embolism without acute cor pulmonale: Secondary | ICD-10-CM

## 2016-01-15 MED ORDER — DENOSUMAB 120 MG/1.7ML ~~LOC~~ SOLN
120.0000 mg | Freq: Once | SUBCUTANEOUS | Status: AC
  Administered 2016-01-15: 120 mg via SUBCUTANEOUS
  Filled 2016-01-15: qty 1.7

## 2016-01-15 MED ORDER — OXYCODONE HCL 5 MG PO TABS: 5.0000 mg | ORAL_TABLET | ORAL | Status: DC | PRN

## 2016-01-15 NOTE — Progress Notes (Signed)
Hematology and Oncology Follow Up Visit  Rowley Groeneweg [id number] [birth year] 70 y.o. 01/15/2016 10:32 AM   [name], [name]  [name], [name]  Principle Diagnosis: This is a 70 year old gentleman with prostate cancer initially diagnosed in 2001.  He had a Gleason score of 3 + 3 = 6.  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     Prior Therapy:  1. Status post prostatectomy done in May 2001.  He had T3 N1 disease.  PSA nadir to 0.  2. The patient developed biochemical relapse and received prostatic bed radiation.  3. The patient received Lupron with Casodex due to rising PSA.  The patient subsequently developed castration-resistant disease. 4. Patient treated with Casodex withdrawal and subsequently PSA rose to 18. 5. Patient with Provenge immunotherapy completed in July 2011. 6.  He received ketoconazole and prednisone December 2011 through January 2014. 7.         He is S/P Zytiga 1000 mg daily with Prednisone 5 mg daily on 11/03/12 till 07/2013. This was stopped due to progression of disease.  8.  Xtandi 1000 mg daily 07/2013 through 01/24/2014. Discontinued secondary to disease progression 9.         Docetaxel 75 mg/m2 with Neulasta support, status post 5 cycles. Discontinued 06/05/2014 secondary to disease progression. 10.      Systemic chemotherapy with Jevtana 25 mg/m2 given every 3 weeks. This was dose reduced to 20 mg started on cycle 12. Status post 15 cycles. Therapy discontinued due to progression of disease.  Current therapy: 1. Lynparza (olaparib) 400 mg po BID. Therapy started around 05/04/2015. 2. He is on Lupron 30 mg every 4 months. This will be given on 12/04/2015 and repeated in 4 months. 3. Xgeva given every 6 weeks.This will be given on 01/15/2016 and repeated on 02/27/2016. 4. He is on Lovenox for full dose anticoagulation for pulmonary embolism. He is currently on 90 mg daily.  Interim History: [name]  presents today for a followup visit with his wife. Since the last visit, he reports feeling reasonably well. He reports his back pain is much improved with oxycodone and occasional Aleve. He has reported increased scrotal edema in the last few weeks. He does not report any other edema at this time. He does have lower extremity edema which is unchanged. He does have some discomfort associated with that and some occasional urination difficulties. He denied any fevers or chills. Denied any recent trauma.  He continues on Lovenox and have tolerated it well. He is no longer reporting any bleeding symptoms including hematochezia or melena. Has not reported any recent thrombosis episodes. He is breathing comfortably without any dyspnea on exertion. [name]  He continues taking Olaparib and have tolerated it well. No major changes in his quality of life and ability to perform activities of daily living.  He does not report any headaches, blurry vision, syncope or seizures. He does report some occasional lightheadedness. He does not report any chest pain, palpitation or orthopnea. He does not report any cough, hemoptysis or hematemesis. Does not report any constipation, diarrhea or early satiety. He does not report any arthralgias or myalgias. He does not report any back pain or shoulder pain. He does not report any lymphadenopathy or petechiae. Remainder of his review of systems unremarkable.     Medications: I have reviewed the patient's current medications. Current Outpatient Prescriptions  Medication Sig Dispense Refill  . calcium carbonate (OS-CAL) 1250 MG chewable tablet  Chew 1 tablet by mouth 2 (two) times daily.     [name] enoxaparin (LOVENOX) 100 MG/ML injection Inject 0.9 mLs (90 mg total) into the skin daily. (Patient taking differently: Inject 90 mg into the skin every 12 (twelve) hours. ) 60 Syringe 1  . leuprolide (LUPRON) 30 MG injection Inject 30 mg into the muscle every 4 (four) months.     .  lidocaine-prilocaine (EMLA) cream Apply topically as needed. (Patient taking differently: Apply 1 application topically as needed (for port). ) 1 g 2  . loratadine (CLARITIN) 10 MG tablet Take 10 mg by mouth daily.    [name] MYRBETRIQ 50 MG TB24 tablet Take 50 mg by mouth daily.    . naproxen sodium (ANAPROX) 220 MG tablet Take 220 mg by mouth 2 (two) times daily with a meal.    . olaparib (LYNPARZA) 50 MG capsule Take 400 mg by mouth 2 (two) times daily. Swallow whole.    . ondansetron (ZOFRAN) 8 MG tablet Take 1 tablet (8 mg total) by mouth every 8 (eight) hours as needed for nausea or vomiting. 20 tablet 3  . oxyCODONE (OXY IR/ROXICODONE) 5 MG immediate release tablet Take 1 tablet (5 mg total) by mouth every 4 (four) hours as needed for severe pain. 60 tablet 0   No current facility-administered medications for this visit.    Allergies: No Known Allergies  Past Medical History, Surgical history, Social history, and Family History were reviewed and updated.    Physical Exam: Blood pressure 131/64, pulse 70, temperature 98.4 F (36.9 C), temperature source Oral, resp. rate 18, weight 212 lb (96.163 kg), SpO2 100 %. ECOG: 1 General appearance: Pleasant gentleman appeared without distress. Awake and alert. Head: Normocephalic, without obvious abnormality no oral ulcers or lesions. Neck: no adenopathy Lymph nodes: Cervical, supraclavicular, and axillary nodes normal. Heart:regular rate and rhythm, S1, S2 normal, no murmur, click, rub or gallop Lung:chest clear, no wheezing, rales. Decreased breath sounds at the bases bilaterally. Chest wall examination: Right anterior chest porta cath without erythema or induration. Abdomen: soft, non-tender, without masses or organomegaly or shifting dullness or ascites. Genital examination: Showed scrotal edema noted. No erythema or induration. EXT: Edema noted bilaterally.   Lab Results: Lab Results  Component Value Date   WBC 3.0* 01/13/2016   HGB  8.8* 01/13/2016   HCT 27.3* 01/13/2016   MCV 92.9 01/13/2016   PLT 139* 01/13/2016     Chemistry      Component Value Date/Time   NA 143 01/13/2016 0929   NA 142 07/05/2015 0135   K 4.7 01/13/2016 0929   K 4.0 07/05/2015 0135   CL 110 07/05/2015 0135   CL 108* 03/09/2013 0923   CO2 25 01/13/2016 0929   CO2 24 07/05/2015 0135   BUN 10.7 01/13/2016 0929   BUN 19 07/05/2015 0135   CREATININE 1.1 01/13/2016 0929   CREATININE 1.40* 07/05/2015 0135      Component Value Date/Time   CALCIUM 8.9 01/13/2016 0929   CALCIUM 8.1* 07/05/2015 0135   ALKPHOS 31* 01/13/2016 0929   ALKPHOS 38 07/05/2015 0135   AST 12 01/13/2016 0929   AST 22 07/05/2015 0135   ALT <9 01/13/2016 0929   ALT 21 07/05/2015 0135   BILITOT 0.30 01/13/2016 0929   BILITOT 0.4 07/05/2015 0135       Results for ALVAH, TUOMI (MRN [id number]) as of 01/15/2016 09:29  Ref. Range 12/02/2015 10:54 01/13/2016 09:29  PSA Latest Ref Range: <=4.00 ng/mL 1,529.0 (H)  1813.00 (H)       Impression and Plan:  This is a 70 year old gentleman with the following issues: 1. Castration-resistant prostate cancer with pelvic adenopathy. He has progressed on multiple therapies discussed above. He is currently on PARP inhibitor Lonie Peak- olaparib) since July 2016 and tolerated it well. His PSA have increased to 1813 on 01/13/2016 previously his PSA was 1500 on 12/02/2015. His imaging studies in March 2017 did not show any major changes. Overall, his performance status and quality of life remains reasonable and he would like to continue on the current treatment. He has repeat imaging studies scheduled at [facility] in the near future. 2. IV access: Status post right anterior chest Port-A-Cath placement on 02/06/2014.This will be flushed every 6 weeks. 3. Hydronephrosis: He is S/P stent exchange done on 10/27/2015. 4. Hormonal deprivation.  He continues to be on Lupron. Last given 12/04/2015. This will be  repeated after 04/02/2016. 5. 3.8 cm enhancing mass in the upper pole of the left kidney likely renal cell carcinoma. This will be monitored on future imaging studies. 6. Bone Directed therapy: He is currently on Xgeva and he will receive that on 01/15/2016 and every 6 weeks after that. 7. Pulmonary embolism: He is currently on Lovenox and will probably require lifetime anticoagulation. 8. Abdominal pain: Lower pelvic fullness related to his malignancy. He takes oxycodone with relief of the pain. This was refilled for him today. 9. Scrotal edema: Unclear etiology. He does not have generalized anasarca to explain this. His albumin is slightly low and his total protein is normal. I will asked Dr. Diona Fanti to evaluate him for this issue. 10. Follow-up: Will be in 6 weeks.   Zola Button, MD 4/6/201710:32 AM

## 2016-01-15 NOTE — Telephone Encounter (Signed)
per pof to sch pt appt-gave pt copy of avs °

## 2016-02-24 ENCOUNTER — Other Ambulatory Visit: Payer: Self-pay | Admitting: *Deleted

## 2016-02-24 DIAGNOSIS — C61 Malignant neoplasm of prostate: Secondary | ICD-10-CM

## 2016-02-25 ENCOUNTER — Other Ambulatory Visit (HOSPITAL_BASED_OUTPATIENT_CLINIC_OR_DEPARTMENT_OTHER): Payer: Medicare Other

## 2016-02-25 ENCOUNTER — Ambulatory Visit (HOSPITAL_BASED_OUTPATIENT_CLINIC_OR_DEPARTMENT_OTHER): Payer: Medicare Other

## 2016-02-25 VITALS — BP 132/68 | HR 64 | Temp 97.7°F | Resp 16

## 2016-02-25 DIAGNOSIS — C61 Malignant neoplasm of prostate: Secondary | ICD-10-CM | POA: Diagnosis present

## 2016-02-25 DIAGNOSIS — C7951 Secondary malignant neoplasm of bone: Secondary | ICD-10-CM | POA: Diagnosis not present

## 2016-02-25 LAB — CBC WITH DIFFERENTIAL/PLATELET
BASO%: 1 % (ref 0.0–2.0)
BASOS ABS: 0 10*3/uL (ref 0.0–0.1)
EOS ABS: 0.1 10*3/uL (ref 0.0–0.5)
EOS%: 4.1 % (ref 0.0–7.0)
HEMATOCRIT: 27.3 % — AB (ref 38.4–49.9)
HGB: 8.7 g/dL — ABNORMAL LOW (ref 13.0–17.1)
LYMPH#: 0.8 10*3/uL — AB (ref 0.9–3.3)
LYMPH%: 23.8 % (ref 14.0–49.0)
MCH: 30.1 pg (ref 27.2–33.4)
MCHC: 31.9 g/dL — AB (ref 32.0–36.0)
MCV: 94.5 fL (ref 79.3–98.0)
MONO#: 0.3 10*3/uL (ref 0.1–0.9)
MONO%: 7.9 % (ref 0.0–14.0)
NEUT#: 2 10*3/uL (ref 1.5–6.5)
NEUT%: 63.2 % (ref 39.0–75.0)
Platelets: 137 10*3/uL — ABNORMAL LOW (ref 140–400)
RBC: 2.89 10*6/uL — ABNORMAL LOW (ref 4.20–5.82)
RDW: 19.3 % — AB (ref 11.0–14.6)
WBC: 3.2 10*3/uL — AB (ref 4.0–10.3)
nRBC: 2 % — ABNORMAL HIGH (ref 0–0)

## 2016-02-25 LAB — COMPREHENSIVE METABOLIC PANEL
ALT: <9 U/L (ref 0–55)
ANION GAP: 6 meq/L (ref 3–11)
AST: 12 U/L (ref 5–34)
Albumin: 3.5 g/dL (ref 3.5–5.0)
Alkaline Phosphatase: 32 U/L — ABNORMAL LOW (ref 40–150)
BUN: 15 mg/dL (ref 7.0–26.0)
CALCIUM: 9.4 mg/dL (ref 8.4–10.4)
CHLORIDE: 108 meq/L (ref 98–109)
CO2: 27 mEq/L (ref 22–29)
Creatinine: 1.2 mg/dL (ref 0.7–1.3)
EGFR: 70 mL/min/{1.73_m2} — ABNORMAL LOW (ref >90)
Glucose: 101 mg/dl (ref 70–140)
POTASSIUM: 4.3 meq/L (ref 3.5–5.1)
Sodium: 142 mEq/L (ref 136–145)
Total Bilirubin: 0.32 mg/dL (ref 0.20–1.20)
Total Protein: 6.6 g/dL (ref 6.4–8.3)

## 2016-02-25 MED ORDER — SODIUM CHLORIDE 0.9 % IJ SOLN
10.0000 mL | Freq: Once | INTRAMUSCULAR | Status: AC
  Administered 2016-02-25: 10 mL
  Filled 2016-02-25: qty 10

## 2016-02-25 MED ORDER — HEPARIN SOD (PORK) LOCK FLUSH 100 UNIT/ML IV SOLN
500.0000 [IU] | Freq: Once | INTRAVENOUS | Status: AC
  Administered 2016-02-25: 500 [IU]
  Filled 2016-02-25: qty 5

## 2016-02-26 LAB — PSA

## 2016-02-27 ENCOUNTER — Ambulatory Visit (HOSPITAL_BASED_OUTPATIENT_CLINIC_OR_DEPARTMENT_OTHER): Payer: Medicare Other

## 2016-02-27 ENCOUNTER — Ambulatory Visit (HOSPITAL_BASED_OUTPATIENT_CLINIC_OR_DEPARTMENT_OTHER): Payer: Medicare Other | Admitting: Oncology

## 2016-02-27 ENCOUNTER — Other Ambulatory Visit: Payer: Self-pay | Admitting: *Deleted

## 2016-02-27 ENCOUNTER — Telehealth: Payer: Self-pay | Admitting: Oncology

## 2016-02-27 VITALS — BP 134/68 | HR 81 | Temp 98.0°F | Resp 18 | Ht 68.0 in | Wt 208.6 lb

## 2016-02-27 DIAGNOSIS — C7951 Secondary malignant neoplasm of bone: Secondary | ICD-10-CM

## 2016-02-27 DIAGNOSIS — G893 Neoplasm related pain (acute) (chronic): Secondary | ICD-10-CM

## 2016-02-27 DIAGNOSIS — E291 Testicular hypofunction: Secondary | ICD-10-CM | POA: Diagnosis not present

## 2016-02-27 DIAGNOSIS — I2699 Other pulmonary embolism without acute cor pulmonale: Secondary | ICD-10-CM

## 2016-02-27 DIAGNOSIS — C61 Malignant neoplasm of prostate: Secondary | ICD-10-CM | POA: Diagnosis present

## 2016-02-27 DIAGNOSIS — Z7901 Long term (current) use of anticoagulants: Secondary | ICD-10-CM | POA: Diagnosis not present

## 2016-02-27 MED ORDER — OXYCODONE HCL 5 MG PO TABS: 5.0000 mg | ORAL_TABLET | ORAL | Status: DC | PRN

## 2016-02-27 MED ORDER — DENOSUMAB 120 MG/1.7ML ~~LOC~~ SOLN
120.0000 mg | Freq: Once | SUBCUTANEOUS | Status: AC
  Administered 2016-02-27: 120 mg via SUBCUTANEOUS
  Filled 2016-02-27: qty 1.7

## 2016-02-27 NOTE — Progress Notes (Signed)
Hematology and Oncology Follow Up Visit  [name] [id number] September 08, 1946 70 y.o. 02/27/2016 3:47 PM   [name], [name]  [name], [name]  Principle Diagnosis: This is a 70 year old gentleman with prostate cancer initially diagnosed in 2001.  He had a Gleason score of 3 + 3 = 6.  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     Prior Therapy:  1. Status post prostatectomy done in May 2001.  He had T3 N1 disease.  PSA nadir to 0.  2. The patient developed biochemical relapse and received prostatic bed radiation.  3. The patient received Lupron with Casodex due to rising PSA.  The patient subsequently developed castration-resistant disease. 4. Patient treated with Casodex withdrawal and subsequently PSA rose to 18. 5. Patient with Provenge immunotherapy completed in July 2011. 6.  He received ketoconazole and prednisone December 2011 through January 2014. 7.         He is S/P Zytiga 1000 mg daily with Prednisone 5 mg daily on 11/03/12 till 07/2013. This was stopped due to progression of disease.  8.  Xtandi 1000 mg daily 07/2013 through 01/24/2014. Discontinued secondary to disease progression 9.         Docetaxel 75 mg/m2 with Neulasta support, status post 5 cycles. Discontinued 06/05/2014 secondary to disease progression. 10.      Systemic chemotherapy with Jevtana 25 mg/m2 given every 3 weeks. This was dose reduced to 20 mg started on cycle 12. Status post 15 cycles. Therapy discontinued due to progression of disease.  Current therapy: 1. Lynparza (olaparib) 400 mg po BID. Therapy started around 05/04/2015. 2. He is on Lupron 30 mg every 4 months. This will be given on 12/04/2015 and repeated in June 2017. 3. Xgeva given every 6 weeks.This will be given on 02/27/2016 and repeated in June 2017. 4. He is on Lovenox for full dose anticoagulation for pulmonary embolism. He is currently on 90 mg daily.  Interim History: [name]  presents today for a followup visit with his wife. Since the last visit, he reports feeling the same. He does not report any major decline in his performance status or activity level. 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 His appetite have decreased and have continued to lose weight. He reports feeling well otherwise and does not report any major changes in his energy.  He reports his back pain is much improved with oxycodone which she uses infrequently. He continues on Lovenox and have tolerated it well. He is no longer reporting any bleeding symptoms including hematochezia or melena. Has not reported any recent thrombosis episodes. [name]  He continues taking Olaparib and have tolerated it well without any side effects. He reports that his quality of life have been maintained.  He does not report any headaches, blurry vision, syncope or seizures. He does report some occasional lightheadedness. He does not report any chest pain, palpitation or orthopnea. He does not report any cough, hemoptysis or hematemesis. Does not report any constipation, diarrhea or early satiety. He does not report any arthralgias or myalgias. He does not report any back pain or shoulder pain. He does not report any lymphadenopathy or petechiae. Remainder of his review of systems unremarkable.     Medications: I have reviewed the patient's current medications. Current Outpatient Prescriptions  Medication Sig Dispense Refill  . calcium carbonate (OS-CAL) 1250 MG chewable tablet Chew 1 tablet by mouth 2 (two) times daily.     [name]  enoxaparin (LOVENOX) 100 MG/ML injection Inject 0.9 mLs (90 mg total) into the skin daily. (Patient taking differently: Inject 90 mg into the skin every 12 (twelve) hours. ) 60 Syringe 1  . enoxaparin (LOVENOX) 100 MG/ML injection INJECT 0.9 MLS (90 MG TOTAL) INTO THE SKIN DAILY. 60 mL PRN  . leuprolide (LUPRON) 30 MG injection Inject 30 mg into the muscle every 4 (four)  months.     . lidocaine-prilocaine (EMLA) cream Apply topically as needed. (Patient taking differently: Apply 1 application topically as needed (for port). ) 1 g 2  . loratadine (CLARITIN) 10 MG tablet Take 10 mg by mouth daily.    [name] MYRBETRIQ 50 MG TB24 tablet Take 50 mg by mouth daily.    . naproxen sodium (ANAPROX) 220 MG tablet Take 220 mg by mouth 2 (two) times daily with a meal.    . olaparib (LYNPARZA) 50 MG capsule Take 400 mg by mouth 2 (two) times daily. Swallow whole.    . ondansetron (ZOFRAN) 8 MG tablet Take 1 tablet (8 mg total) by mouth every 8 (eight) hours as needed for nausea or vomiting. 20 tablet 3  . oxyCODONE (OXY IR/ROXICODONE) 5 MG immediate release tablet Take 1 tablet (5 mg total) by mouth every 4 (four) hours as needed for severe pain. 60 tablet 0   No current facility-administered medications for this visit.    Allergies: No Known Allergies  Past Medical History, Surgical history, Social history, and Family History were reviewed and updated.    Physical Exam: Blood pressure 134/68, pulse 81, temperature 98 F (36.7 C), temperature source Oral, resp. rate 18, height 5\' 8"  (1.727 m), weight 208 lb 9.6 oz (94.62 kg), SpO2 100 %. ECOG: 1 General appearance: Well-appearing gentleman without distress. Head: Normocephalic, without obvious abnormality no oral ulcers or lesions. Neck: no adenopathy Lymph nodes: Cervical, supraclavicular, and axillary nodes normal. Heart:regular rate and rhythm, S1, S2 normal, no murmur, click, rub or gallop Lung:chest clear, no wheezing, rales. Decreased breath sounds at the bases bilaterally. Chest wall examination: Right anterior chest porta cath without erythema or induration. Abdomen: soft, non-tender, without masses or organomegaly no shifting dullness or ascites. EXT: Edema noted bilaterally.   Lab Results: Lab Results  Component Value Date   WBC 3.2* 02/25/2016   HGB 8.7* 02/25/2016   HCT 27.3* 02/25/2016   MCV 94.5  02/25/2016   PLT 137* 02/25/2016     Chemistry      Component Value Date/Time   NA 142 02/25/2016 0847   NA 142 07/05/2015 0135   K 4.3 02/25/2016 0847   K 4.0 07/05/2015 0135   CL 110 07/05/2015 0135   CL 108* 03/09/2013 0923   CO2 27 02/25/2016 0847   CO2 24 07/05/2015 0135   BUN 15.0 02/25/2016 0847   BUN 19 07/05/2015 0135   CREATININE 1.2 02/25/2016 0847   CREATININE 1.40* 07/05/2015 0135      Component Value Date/Time   CALCIUM 9.4 02/25/2016 0847   CALCIUM 8.1* 07/05/2015 0135   ALKPHOS 32* 02/25/2016 0847   ALKPHOS 38 07/05/2015 0135   AST 12 02/25/2016 0847   AST 22 07/05/2015 0135   ALT <9 02/25/2016 0847   ALT 21 07/05/2015 0135   BILITOT 0.32 02/25/2016 0847   BILITOT 0.4 07/05/2015 0135       Results for MAEL, MILCH (MRN [id number]) as of 02/27/2016 15:52  Ref. Range 01/13/2016 09:29 02/25/2016 08:47  PSA Latest Ref Range: 0.0-4.0 ng/mL 1,888.0 (H) 1,674.0 (  H)        Impression and Plan:  This is a 70 year old gentleman with the following issues: 1. Castration-resistant prostate cancer with pelvic adenopathy. He has progressed on multiple therapies discussed above. He is currently on PARP inhibitor Lonie Peak- olaparib) since July 2016 and tolerated it well. His PSA temains relatively stable in the last 4 months and have decreased slightly to 1600 from 1806 weeks ago. His quality of life remains reasonably maintained and I recommended continuing the same dose and schedule and he'll have repeat imaging studies in June 2017. 2. IV access: Status post right anterior chest Port-A-Cath placement on 02/06/2014. This will be flushed every 6 weeks. 3. Hydronephrosis: He is S/P stent exchange done on 10/27/2015. 4. Hormonal deprivation.  He continues to be on Lupron. Last given 12/04/2015. This will be repeated after 04/02/2016. 5. 3.8 cm enhancing mass in the upper pole of the left kidney likely renal cell carcinoma. This will be monitored on future imaging  studies. 6. Bone Directed therapy: He is currently on Xgeva and he will receive that on 02/27/2016 and every 6 weeks after that. 7. Pulmonary embolism: He is currently on Lovenox and will require lifetime anticoagulation. 8. Abdominal pain: Lower pelvic fullness related to his malignancy. He takes oxycodone with relief of the pain. This was refilled for him today. 9. Follow-up: Will be in 6 weeks.   Zola Button, MD 5/19/20173:47 PM

## 2016-02-27 NOTE — Telephone Encounter (Signed)
Added appts °

## 2016-02-27 NOTE — Telephone Encounter (Signed)
Gave pt apt & avs °

## 2016-04-06 ENCOUNTER — Other Ambulatory Visit: Payer: Medicare Other

## 2016-04-06 ENCOUNTER — Telehealth: Payer: Self-pay | Admitting: Oncology

## 2016-04-06 ENCOUNTER — Other Ambulatory Visit: Payer: Self-pay | Admitting: *Deleted

## 2016-04-06 NOTE — Telephone Encounter (Signed)
Added lab to 6/28 pt aware per pof

## 2016-04-07 ENCOUNTER — Telehealth: Payer: Self-pay | Admitting: Oncology

## 2016-04-07 ENCOUNTER — Ambulatory Visit (HOSPITAL_BASED_OUTPATIENT_CLINIC_OR_DEPARTMENT_OTHER): Payer: Medicare Other | Admitting: Oncology

## 2016-04-07 ENCOUNTER — Other Ambulatory Visit: Payer: Medicare Other

## 2016-04-07 ENCOUNTER — Ambulatory Visit (HOSPITAL_BASED_OUTPATIENT_CLINIC_OR_DEPARTMENT_OTHER): Payer: Medicare Other

## 2016-04-07 VITALS — BP 134/66 | HR 61 | Temp 97.4°F | Resp 18 | Ht 68.0 in | Wt 203.9 lb

## 2016-04-07 DIAGNOSIS — C7951 Secondary malignant neoplasm of bone: Secondary | ICD-10-CM

## 2016-04-07 DIAGNOSIS — N1330 Unspecified hydronephrosis: Secondary | ICD-10-CM

## 2016-04-07 DIAGNOSIS — Z5111 Encounter for antineoplastic chemotherapy: Secondary | ICD-10-CM | POA: Diagnosis present

## 2016-04-07 DIAGNOSIS — G893 Neoplasm related pain (acute) (chronic): Secondary | ICD-10-CM | POA: Diagnosis not present

## 2016-04-07 DIAGNOSIS — E291 Testicular hypofunction: Secondary | ICD-10-CM

## 2016-04-07 DIAGNOSIS — C61 Malignant neoplasm of prostate: Secondary | ICD-10-CM

## 2016-04-07 DIAGNOSIS — Z7901 Long term (current) use of anticoagulants: Secondary | ICD-10-CM | POA: Diagnosis not present

## 2016-04-07 DIAGNOSIS — I2699 Other pulmonary embolism without acute cor pulmonale: Secondary | ICD-10-CM

## 2016-04-07 MED ORDER — LEUPROLIDE ACETATE (4 MONTH) 30 MG IM KIT
30.0000 mg | PACK | Freq: Once | INTRAMUSCULAR | Status: AC
  Administered 2016-04-07: 30 mg via INTRAMUSCULAR
  Filled 2016-04-07: qty 30

## 2016-04-07 MED ORDER — DENOSUMAB 120 MG/1.7ML ~~LOC~~ SOLN
120.0000 mg | Freq: Once | SUBCUTANEOUS | Status: AC
  Administered 2016-04-07: 120 mg via SUBCUTANEOUS
  Filled 2016-04-07: qty 1.7

## 2016-04-07 MED ORDER — OXYCODONE HCL 5 MG PO TABS: 5.0000 mg | ORAL_TABLET | ORAL | Status: DC | PRN

## 2016-04-07 NOTE — Patient Instructions (Signed)
Leuprolide depot injection What is this medicine? LEUPROLIDE (loo PROE lide) is a man-made protein that acts like a natural hormone in the body. It decreases testosterone in men and decreases estrogen in women. In men, this medicine is used to treat advanced prostate cancer. In women, some forms of this medicine may be used to treat endometriosis, uterine fibroids, or other male hormone-related problems. This medicine may be used for other purposes; ask your health care provider or pharmacist if you have questions. What should I tell my health care provider before I take this medicine? They need to know if you have any of these conditions: -diabetes -heart disease or previous heart attack -high blood pressure -high cholesterol -osteoporosis -pain or difficulty passing urine -spinal cord metastasis -stroke -tobacco smoker -unusual vaginal bleeding (women) -an unusual or allergic reaction to leuprolide, benzyl alcohol, other medicines, foods, dyes, or preservatives -pregnant or trying to get pregnant -breast-feeding How should I use this medicine? This medicine is for injection into a muscle or for injection under the skin. It is given by a health care professional in a hospital or clinic setting. The specific product will determine how it will be given to you. Make sure you understand which product you receive and how often you will receive it. Talk to your pediatrician regarding the use of this medicine in children. Special care may be needed. Overdosage: If you think you have taken too much of this medicine contact a poison control center or emergency room at once. NOTE: This medicine is only for you. Do not share this medicine with others. What if I miss a dose? It is important not to miss a dose. Call your doctor or health care professional if you are unable to keep an appointment. Depot injections: Depot injections are given either once-monthly, every 12 weeks, every 16 weeks, or  every 24 weeks depending on the product you are prescribed. The product you are prescribed will be based on if you are male or male, and your condition. Make sure you understand your product and dosing. What may interact with this medicine? Do not take this medicine with any of the following medications: -chasteberry This medicine may also interact with the following medications: -herbal or dietary supplements, like black cohosh or DHEA -male hormones, like estrogens or progestins and birth control pills, patches, rings, or injections -male hormones, like testosterone This list may not describe all possible interactions. Give your health care provider a list of all the medicines, herbs, non-prescription drugs, or dietary supplements you use. Also tell them if you smoke, drink alcohol, or use illegal drugs. Some items may interact with your medicine. What should I watch for while using this medicine? Visit your doctor or health care professional for regular checks on your progress. During the first weeks of treatment, your symptoms may get worse, but then will improve as you continue your treatment. You may get hot flashes, increased bone pain, increased difficulty passing urine, or an aggravation of nerve symptoms. Discuss these effects with your doctor or health care professional, some of them may improve with continued use of this medicine. Male patients may experience a menstrual cycle or spotting during the first months of therapy with this medicine. If this continues, contact your doctor or health care professional. What side effects may I notice from receiving this medicine? Side effects that you should report to your doctor or health care professional as soon as possible: -allergic reactions like skin rash, itching or hives, swelling of the   face, lips, or tongue -breathing problems -chest pain -depression or memory disorders -pain in your legs or groin -pain at site where injected or  implanted -severe headache -swelling of the feet and legs -visual changes -vomiting Side effects that usually do not require medical attention (report to your doctor or health care professional if they continue or are bothersome): -breast swelling or tenderness -decrease in sex drive or performance -diarrhea -hot flashes -loss of appetite -muscle, joint, or bone pains -nausea -redness or irritation at site where injected or implanted -skin problems or acne This list may not describe all possible side effects. Call your doctor for medical advice about side effects. You may report side effects to FDA at 1-800-FDA-1088. Where should I keep my medicine? This drug is given in a hospital or clinic and will not be stored at home. NOTE: This sheet is a summary. It may not cover all possible information. If you have questions about this medicine, talk to your doctor, pharmacist, or health care provider.    2016, Elsevier/Gold Standard. (2014-06-21 14:16:23) Denosumab injection What is this medicine? DENOSUMAB (den oh sue mab) slows bone breakdown. Prolia is used to treat osteoporosis in women after menopause and in men. Xgeva is used to prevent bone fractures and other bone problems caused by cancer bone metastases. Xgeva is also used to treat giant cell tumor of the bone. This medicine may be used for other purposes; ask your health care provider or pharmacist if you have questions. What should I tell my health care provider before I take this medicine? They need to know if you have any of these conditions: -dental disease -eczema -infection or history of infections -kidney disease or on dialysis -low blood calcium or vitamin D -malabsorption syndrome -scheduled to have surgery or tooth extraction -taking medicine that contains denosumab -thyroid or parathyroid disease -an unusual reaction to denosumab, other medicines, foods, dyes, or preservatives -pregnant or trying to get  pregnant -breast-feeding How should I use this medicine? This medicine is for injection under the skin. It is given by a health care professional in a hospital or clinic setting. If you are getting Prolia, a special MedGuide will be given to you by the pharmacist with each prescription and refill. Be sure to read this information carefully each time. For Prolia, talk to your pediatrician regarding the use of this medicine in children. Special care may be needed. For Xgeva, talk to your pediatrician regarding the use of this medicine in children. While this drug may be prescribed for children as young as 13 years for selected conditions, precautions do apply. Overdosage: If you think you have taken too much of this medicine contact a poison control center or emergency room at once. NOTE: This medicine is only for you. Do not share this medicine with others. What if I miss a dose? It is important not to miss your dose. Call your doctor or health care professional if you are unable to keep an appointment. What may interact with this medicine? Do not take this medicine with any of the following medications: -other medicines containing denosumab This medicine may also interact with the following medications: -medicines that suppress the immune system -medicines that treat cancer -steroid medicines like prednisone or cortisone This list may not describe all possible interactions. Give your health care provider a list of all the medicines, herbs, non-prescription drugs, or dietary supplements you use. Also tell them if you smoke, drink alcohol, or use illegal drugs. Some items may interact with   your medicine. What should I watch for while using this medicine? Visit your doctor or health care professional for regular checks on your progress. Your doctor or health care professional may order blood tests and other tests to see how you are doing. Call your doctor or health care professional if you get a  cold or other infection while receiving this medicine. Do not treat yourself. This medicine may decrease your body's ability to fight infection. You should make sure you get enough calcium and vitamin D while you are taking this medicine, unless your doctor tells you not to. Discuss the foods you eat and the vitamins you take with your health care professional. See your dentist regularly. Brush and floss your teeth as directed. Before you have any dental work done, tell your dentist you are receiving this medicine. Do not become pregnant while taking this medicine or for 5 months after stopping it. Women should inform their doctor if they wish to become pregnant or think they might be pregnant. There is a potential for serious side effects to an unborn child. Talk to your health care professional or pharmacist for more information. What side effects may I notice from receiving this medicine? Side effects that you should report to your doctor or health care professional as soon as possible: -allergic reactions like skin rash, itching or hives, swelling of the face, lips, or tongue -breathing problems -chest pain -fast, irregular heartbeat -feeling faint or lightheaded, falls -fever, chills, or any other sign of infection -muscle spasms, tightening, or twitches -numbness or tingling -skin blisters or bumps, or is dry, peels, or red -slow healing or unexplained pain in the mouth or jaw -unusual bleeding or bruising Side effects that usually do not require medical attention (Report these to your doctor or health care professional if they continue or are bothersome.): -muscle pain -stomach upset, gas This list may not describe all possible side effects. Call your doctor for medical advice about side effects. You may report side effects to FDA at 1-800-FDA-1088. Where should I keep my medicine? This medicine is only given in a clinic, doctor's office, or other health care setting and will not be  stored at home. NOTE: This sheet is a summary. It may not cover all possible information. If you have questions about this medicine, talk to your doctor, pharmacist, or health care provider.    2016, Elsevier/Gold Standard. (2012-03-27 12:37:47) 

## 2016-04-07 NOTE — Progress Notes (Signed)
Hematology and Oncology Follow Up Visit  Juandavid Gonda [id number] 08-18-1946 70 y.o. 04/07/2016 9:19 AM   [name], [name]  [name], [name]  Principle Diagnosis: This is a 70 year old gentleman with prostate cancer initially diagnosed in 2001.  He had a Gleason score of 3 + 3 = 6.  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     Prior Therapy:  1. Status post prostatectomy done in May 2001.  He had T3 N1 disease.  PSA nadir to 0.  2. The patient developed biochemical relapse and received prostatic bed radiation.  3. The patient received Lupron with Casodex due to rising PSA.  The patient subsequently developed castration-resistant disease. 4. Patient treated with Casodex withdrawal and subsequently PSA rose to 18. 5. Patient with Provenge immunotherapy completed in July 2011. 6.  He received ketoconazole and prednisone December 2011 through January 2014. 7.         He is S/P Zytiga 1000 mg daily with Prednisone 5 mg daily on 11/03/12 till 07/2013. This was stopped due to progression of disease.  8.  Xtandi 1000 mg daily 07/2013 through 01/24/2014. Discontinued secondary to disease progression 9.         Docetaxel 75 mg/m2 with Neulasta support, status post 5 cycles. Discontinued 06/05/2014 secondary to disease progression. 10.      Systemic chemotherapy with Jevtana 25 mg/m2 given every 3 weeks. This was dose reduced to 20 mg started on cycle 12. Status post 15 cycles. Therapy discontinued due to progression of disease.  Current therapy: 1. Lynparza (olaparib) 400 mg po BID. Therapy started around 05/04/2015. 2. He is on Lupron 30 mg every 4 months. This will be given on June 2017. This will be repeated after 08/07/2016. 3. Xgeva given every 6 weeks.This will be given on 04/07/2016 and repeated every 6 weeks. 4. He is on Lovenox for full dose anticoagulation for pulmonary embolism. He is currently on 90 mg daily.  Interim  History: [name] presents today for a followup visit with his wife. Since the last visit, he reports no major changes in his health. His appetite remains about the same without decline in his quality of life. He does not report any major decline in his performance status or activity level. 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 He does not report any increase in his pain quality and does report taking oxycodone periodically. This predominantly for his back and pelvic area without any major  He continues taking Olaparib and have tolerated it well without any side effects. He reports that his quality of life have been maintained. He denied any recent complications including pruritus, GI toxicity.  He does have lower extremity edema which is stable and his scrotal edema have resolved.  He does not report any headaches, blurry vision, syncope or seizures. He does report some occasional lightheadedness. He does not report any chest pain, palpitation or orthopnea. He does not report any cough, hemoptysis or hematemesis. Does not report any constipation, diarrhea or early satiety. He does not report any arthralgias or myalgias. He does not report any back pain or shoulder pain. He does not report any lymphadenopathy or petechiae. Remainder of his review of systems unremarkable.     Medications: I have reviewed the patient's current medications. Current Outpatient Prescriptions  Medication Sig Dispense Refill  . calcium carbonate (OS-CAL) 1250 MG chewable tablet Chew 1 tablet by mouth 2 (two) times daily.     [name]  enoxaparin (LOVENOX) 100 MG/ML injection INJECT 0.9 MLS (90 MG TOTAL) INTO THE SKIN DAILY. 60 mL PRN  . leuprolide (LUPRON) 30 MG injection Inject 30 mg into the muscle every 4 (four) months.     . lidocaine-prilocaine (EMLA) cream Apply topically as needed. (Patient taking differently: Apply 1 application topically as needed (for port). ) 1 g 2  . loratadine  (CLARITIN) 10 MG tablet Take 10 mg by mouth daily.    [name] MYRBETRIQ 50 MG TB24 tablet Take 50 mg by mouth daily.    . naproxen sodium (ANAPROX) 220 MG tablet Take 220 mg by mouth 2 (two) times daily with a meal.    . olaparib (LYNPARZA) 50 MG capsule Take 400 mg by mouth 2 (two) times daily. Swallow whole.    . ondansetron (ZOFRAN) 8 MG tablet Take 1 tablet (8 mg total) by mouth every 8 (eight) hours as needed for nausea or vomiting. 20 tablet 3  . oxyCODONE (OXY IR/ROXICODONE) 5 MG immediate release tablet Take 1 tablet (5 mg total) by mouth every 4 (four) hours as needed for severe pain. 60 tablet 0   No current facility-administered medications for this visit.    Allergies: No Known Allergies  Past Medical History, Surgical history, Social history, and Family History were reviewed and updated.    Physical Exam: Blood pressure 134/66, pulse 61, temperature 97.4 F (36.3 C), temperature source Oral, resp. rate 18, height 5\' 8"  (1.727 m), weight 203 lb 14.4 oz (92.488 kg), SpO2 100 %. ECOG: 1 General appearance: Alert, awake gentleman chronically ill-appearing without distress. Head: Normocephalic, without obvious abnormality no oral thrush noted. Neck: no adenopathy Lymph nodes: Cervical, supraclavicular, and axillary nodes normal. Heart:regular rate and rhythm, S1, S2 normal, no murmur, click, rub or gallop Lung: clear, no wheezing, rales. No dullness to percussion. Chest wall examination: Right anterior chest porta cath without erythema or induration. Abdomen: soft, non-tender, without masses or organomegaly no rebound or guarding. EXT: Edema noted bilaterally.   Lab Results: Lab Results  Component Value Date   WBC 3.2* 02/25/2016   HGB 8.7* 02/25/2016   HCT 27.3* 02/25/2016   MCV 94.5 02/25/2016   PLT 137* 02/25/2016     Chemistry      Component Value Date/Time   NA 142 02/25/2016 0847   NA 142 07/05/2015 0135   K 4.3 02/25/2016 0847   K 4.0 07/05/2015 0135   CL 110  07/05/2015 0135   CL 108* 03/09/2013 0923   CO2 27 02/25/2016 0847   CO2 24 07/05/2015 0135   BUN 15.0 02/25/2016 0847   BUN 19 07/05/2015 0135   CREATININE 1.2 02/25/2016 0847   CREATININE 1.40* 07/05/2015 0135      Component Value Date/Time   CALCIUM 9.4 02/25/2016 0847   CALCIUM 8.1* 07/05/2015 0135   ALKPHOS 32* 02/25/2016 0847   ALKPHOS 38 07/05/2015 0135   AST 12 02/25/2016 0847   AST 22 07/05/2015 0135   ALT <9 02/25/2016 0847   ALT 21 07/05/2015 0135   BILITOT 0.32 02/25/2016 0847   BILITOT 0.4 07/05/2015 0135       Results for KAMAHAO, STEFFENSMEIER (MRN [id number]) as of 02/27/2016 15:52  Ref. Range 01/13/2016 09:29 02/25/2016 08:47  PSA Latest Ref Range: 0.0-4.0 ng/mL 1,888.0 (H) 1,674.0 (H)    PSA on 04/05/2016 was 2500.    Impression and Plan:  This is a 70 year old gentleman with the following issues: 1. Castration-resistant prostate cancer with pelvic adenopathy. He has progressed on  multiple therapies discussed above. He is currently on PARP inhibitor Lonie Peak- olaparib) since July 2016 and tolerated it well. His PSA is slightly elevated and his CT scan done on 03/25/2016 at [facility] was stable. The plan is to continue with the same dose and schedule given his overall stable disease and reasonable quality of life.. 2. IV access: Status post right anterior chest Port-A-Cath placement on 02/06/2014. This will be flushed every 6 weeks. 3. Hydronephrosis: He is S/P stent exchange done on 10/27/2015. 4. Hormonal deprivation.  He continues to be on Lupron. Lupron will be given on 04/07/2016 and repeated every 4 months. 5. 3.8 cm enhancing mass in the upper pole of the left kidney likely renal cell carcinoma. Dramatically changed from previous imaging studies. 6. Bone Directed therapy: He is currently on Xgeva and he will receive that on 04/07/2016 and in 6 weeks. 7. Pulmonary embolism: He is currently on Lovenox and will require lifetime  anticoagulation. 8. Abdominal pain: Lower pelvic fullness related to his malignancy. He takes oxycodone with relief of the pain. This was refilled for him today. 9. Follow-up: Will be in 6 weeks.   [facility], MD 6/28/20179:19 AM

## 2016-04-07 NOTE — Telephone Encounter (Signed)
Gave pt cal & avs °

## 2016-05-25 ENCOUNTER — Other Ambulatory Visit (HOSPITAL_BASED_OUTPATIENT_CLINIC_OR_DEPARTMENT_OTHER): Payer: Medicare Other

## 2016-05-25 ENCOUNTER — Ambulatory Visit (HOSPITAL_BASED_OUTPATIENT_CLINIC_OR_DEPARTMENT_OTHER): Payer: Medicare Other

## 2016-05-25 DIAGNOSIS — C61 Malignant neoplasm of prostate: Secondary | ICD-10-CM | POA: Diagnosis present

## 2016-05-25 DIAGNOSIS — C7951 Secondary malignant neoplasm of bone: Secondary | ICD-10-CM | POA: Diagnosis not present

## 2016-05-25 DIAGNOSIS — Z95828 Presence of other vascular implants and grafts: Secondary | ICD-10-CM

## 2016-05-25 LAB — COMPREHENSIVE METABOLIC PANEL
ALK PHOS: 32 U/L — AB (ref 40–150)
ALT: <9 U/L (ref 0–55)
ANION GAP: 8 meq/L (ref 3–11)
AST: 13 U/L (ref 5–34)
Albumin: 3.3 g/dL — ABNORMAL LOW (ref 3.5–5.0)
BILIRUBIN TOTAL: 0.3 mg/dL (ref 0.20–1.20)
BUN: 17.1 mg/dL (ref 7.0–26.0)
CO2: 26 meq/L (ref 22–29)
Calcium: 9.3 mg/dL (ref 8.4–10.4)
Chloride: 106 mEq/L (ref 98–109)
Creatinine: 1.1 mg/dL (ref 0.7–1.3)
EGFR: 79 mL/min/{1.73_m2} — AB (ref >90)
Glucose: 110 mg/dl (ref 70–140)
POTASSIUM: 4.5 meq/L (ref 3.5–5.1)
SODIUM: 141 meq/L (ref 136–145)
TOTAL PROTEIN: 6.5 g/dL (ref 6.4–8.3)

## 2016-05-25 LAB — CBC WITH DIFFERENTIAL/PLATELET
BASO%: 0.8 % (ref 0.0–2.0)
Basophils Absolute: 0 10*3/uL (ref 0.0–0.1)
EOS%: 3 % (ref 0.0–7.0)
Eosinophils Absolute: 0.1 10*3/uL (ref 0.0–0.5)
HCT: 27.8 % — ABNORMAL LOW (ref 38.4–49.9)
HEMOGLOBIN: 9 g/dL — AB (ref 13.0–17.1)
LYMPH#: 0.8 10*3/uL — AB (ref 0.9–3.3)
LYMPH%: 21.3 % (ref 14.0–49.0)
MCH: 29.9 pg (ref 27.2–33.4)
MCHC: 32.4 g/dL (ref 32.0–36.0)
MCV: 92.4 fL (ref 79.3–98.0)
MONO#: 0.3 10*3/uL (ref 0.1–0.9)
MONO%: 6.9 % (ref 0.0–14.0)
NEUT%: 68 % (ref 39.0–75.0)
NEUTROS ABS: 2.5 10*3/uL (ref 1.5–6.5)
Platelets: 145 10*3/uL (ref 140–400)
RBC: 3.01 10*6/uL — AB (ref 4.20–5.82)
RDW: 18.9 % — ABNORMAL HIGH (ref 11.0–14.6)
WBC: 3.6 10*3/uL — AB (ref 4.0–10.3)
nRBC: 1 % — ABNORMAL HIGH (ref 0–0)

## 2016-05-25 MED ORDER — SODIUM CHLORIDE 0.9 % IJ SOLN
10.0000 mL | INTRAMUSCULAR | Status: DC | PRN
  Administered 2016-05-25: 10 mL via INTRAVENOUS
  Filled 2016-05-25: qty 10

## 2016-05-25 MED ORDER — HEPARIN SOD (PORK) LOCK FLUSH 100 UNIT/ML IV SOLN
500.0000 [IU] | Freq: Once | INTRAVENOUS | Status: AC | PRN
Start: 2016-05-25 — End: 2016-05-25
  Administered 2016-05-25: 500 [IU] via INTRAVENOUS
  Filled 2016-05-25: qty 5

## 2016-05-26 ENCOUNTER — Ambulatory Visit (HOSPITAL_BASED_OUTPATIENT_CLINIC_OR_DEPARTMENT_OTHER): Payer: Medicare Other

## 2016-05-26 ENCOUNTER — Ambulatory Visit (HOSPITAL_BASED_OUTPATIENT_CLINIC_OR_DEPARTMENT_OTHER): Payer: Medicare Other | Admitting: Oncology

## 2016-05-26 ENCOUNTER — Telehealth: Payer: Self-pay | Admitting: Oncology

## 2016-05-26 VITALS — BP 119/65 | HR 69 | Temp 99.6°F | Resp 18 | Ht 68.0 in | Wt 201.1 lb

## 2016-05-26 DIAGNOSIS — C61 Malignant neoplasm of prostate: Secondary | ICD-10-CM

## 2016-05-26 DIAGNOSIS — C7951 Secondary malignant neoplasm of bone: Secondary | ICD-10-CM

## 2016-05-26 DIAGNOSIS — R109 Unspecified abdominal pain: Secondary | ICD-10-CM

## 2016-05-26 DIAGNOSIS — I2699 Other pulmonary embolism without acute cor pulmonale: Secondary | ICD-10-CM | POA: Diagnosis not present

## 2016-05-26 LAB — PSA: Prostate Specific Ag, Serum: 2586 ng/mL — ABNORMAL HIGH (ref 0.0–4.0)

## 2016-05-26 MED ORDER — DENOSUMAB 120 MG/1.7ML ~~LOC~~ SOLN
120.0000 mg | Freq: Once | SUBCUTANEOUS | Status: AC
  Administered 2016-05-26: 120 mg via SUBCUTANEOUS
  Filled 2016-05-26: qty 1.7

## 2016-05-26 MED ORDER — OXYCODONE HCL 5 MG PO TABS: 5.0000 mg | ORAL_TABLET | ORAL | 0 refills | Status: DC | PRN

## 2016-05-26 NOTE — Telephone Encounter (Signed)
GAVE PATIENT AVS RE APPOINTMENTS FOR September.

## 2016-05-26 NOTE — Patient Instructions (Signed)
Denosumab injection  What is this medicine?  DENOSUMAB (den oh sue mab) slows bone breakdown. Prolia is used to treat osteoporosis in women after menopause and in men. Xgeva is used to prevent bone fractures and other bone problems caused by cancer bone metastases. Xgeva is also used to treat giant cell tumor of the bone.  This medicine may be used for other purposes; ask your health care provider or pharmacist if you have questions.  What should I tell my health care provider before I take this medicine?  They need to know if you have any of these conditions:  -dental disease  -eczema  -infection or history of infections  -kidney disease or on dialysis  -low blood calcium or vitamin D  -malabsorption syndrome  -scheduled to have surgery or tooth extraction  -taking medicine that contains denosumab  -thyroid or parathyroid disease  -an unusual reaction to denosumab, other medicines, foods, dyes, or preservatives  -pregnant or trying to get pregnant  -breast-feeding  How should I use this medicine?  This medicine is for injection under the skin. It is given by a health care professional in a hospital or clinic setting.  If you are getting Prolia, a special MedGuide will be given to you by the pharmacist with each prescription and refill. Be sure to read this information carefully each time.  For Prolia, talk to your pediatrician regarding the use of this medicine in children. Special care may be needed. For Xgeva, talk to your pediatrician regarding the use of this medicine in children. While this drug may be prescribed for children as young as 13 years for selected conditions, precautions do apply.  Overdosage: If you think you have taken too much of this medicine contact a poison control center or emergency room at once.  NOTE: This medicine is only for you. Do not share this medicine with others.  What if I miss a dose?  It is important not to miss your dose. Call your doctor or health care professional if you are  unable to keep an appointment.  What may interact with this medicine?  Do not take this medicine with any of the following medications:  -other medicines containing denosumab  This medicine may also interact with the following medications:  -medicines that suppress the immune system  -medicines that treat cancer  -steroid medicines like prednisone or cortisone  This list may not describe all possible interactions. Give your health care provider a list of all the medicines, herbs, non-prescription drugs, or dietary supplements you use. Also tell them if you smoke, drink alcohol, or use illegal drugs. Some items may interact with your medicine.  What should I watch for while using this medicine?  Visit your doctor or health care professional for regular checks on your progress. Your doctor or health care professional may order blood tests and other tests to see how you are doing.  Call your doctor or health care professional if you get a cold or other infection while receiving this medicine. Do not treat yourself. This medicine may decrease your body's ability to fight infection.  You should make sure you get enough calcium and vitamin D while you are taking this medicine, unless your doctor tells you not to. Discuss the foods you eat and the vitamins you take with your health care professional.  See your dentist regularly. Brush and floss your teeth as directed. Before you have any dental work done, tell your dentist you are receiving this medicine.  Do   not become pregnant while taking this medicine or for 5 months after stopping it. Women should inform their doctor if they wish to become pregnant or think they might be pregnant. There is a potential for serious side effects to an unborn child. Talk to your health care professional or pharmacist for more information.  What side effects may I notice from receiving this medicine?  Side effects that you should report to your doctor or health care professional as soon as  possible:  -allergic reactions like skin rash, itching or hives, swelling of the face, lips, or tongue  -breathing problems  -chest pain  -fast, irregular heartbeat  -feeling faint or lightheaded, falls  -fever, chills, or any other sign of infection  -muscle spasms, tightening, or twitches  -numbness or tingling  -skin blisters or bumps, or is dry, peels, or red  -slow healing or unexplained pain in the mouth or jaw  -unusual bleeding or bruising  Side effects that usually do not require medical attention (Report these to your doctor or health care professional if they continue or are bothersome.):  -muscle pain  -stomach upset, gas  This list may not describe all possible side effects. Call your doctor for medical advice about side effects. You may report side effects to FDA at 1-800-FDA-1088.  Where should I keep my medicine?  This medicine is only given in a clinic, doctor's office, or other health care setting and will not be stored at home.  NOTE: This sheet is a summary. It may not cover all possible information. If you have questions about this medicine, talk to your doctor, pharmacist, or health care provider.      2016, Elsevier/Gold Standard. (2012-03-27 12:37:47)

## 2016-05-26 NOTE — Progress Notes (Signed)
Hematology and Oncology Follow Up Visit  [name] [id number] [birth year] 70 y.o. 05/26/2016 10:07 AM   [name], [name]  [name], [name]  Principle Diagnosis: This is a 70 year old gentleman with prostate cancer initially diagnosed in 2001.  He had a Gleason score of 3 + 3 = 6.  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     Prior Therapy:  1. Status post prostatectomy done in May 2001.  He had T3 N1 disease.  PSA nadir to 0.  2. The patient developed biochemical relapse and received prostatic bed radiation.  3. The patient received Lupron with Casodex due to rising PSA.  The patient subsequently developed castration-resistant disease. 4. Patient treated with Casodex withdrawal and subsequently PSA rose to 18. 5. Patient with Provenge immunotherapy completed in July 2011. 6.  He received ketoconazole and prednisone December 2011 through January 2014. 7.         He is S/P Zytiga 1000 mg daily with Prednisone 5 mg daily on 11/03/12 till 07/2013. This was stopped due to progression of disease.  8.  Xtandi 1000 mg daily 07/2013 through 01/24/2014. Discontinued secondary to disease progression 9.         Docetaxel 75 mg/m2 with Neulasta support, status post 5 cycles. Discontinued 06/05/2014 secondary to disease progression. 10.      Systemic chemotherapy with Jevtana 25 mg/m2 given every 3 weeks. This was dose reduced to 20 mg started on cycle 12. Status post 15 cycles. Therapy discontinued due to progression of disease.  Current therapy: 1. Lynparza (olaparib) 400 mg po BID. Therapy started around 05/04/2015. 2. He is on Lupron 30 mg every 4 months. This will be given on June 2017. This will be repeated after 08/07/2016. 3. Xgeva given every 6 weeks.This will be given on 05/26/2016 and repeated every 6 weeks. 4. He is on Lovenox for full dose anticoagulation for pulmonary embolism. He is currently on 90 mg daily.  Interim  History: [name] presents today for a followup visit with his wife. Since the last visit, he reports overall slow decline in his health. His appetite remains about the same but have lost a few pounds since the last visit. He does not report any increase in his pain quality and does report taking oxycodone periodically. This predominantly for his back and pelvic area. His pain continues to be manageable without increased use of oxycodone.  He continues taking Olaparib and have tolerated it well without any side effects. He reports that his quality of life have been maintained. He denied any recent complications including pruritus, GI toxicity. He does report periodic nausea but no vomiting. He denied any diarrhea.  He does not report any headaches, blurry vision, syncope or seizures. He denied any other neurological deficits. He does not report any chest pain, palpitation or orthopnea. He does not report any cough, hemoptysis or hematemesis. Does not report any constipation, diarrhea or early satiety. He does not report any arthralgias or myalgias. He does not report any back pain or shoulder pain. He does not report any lymphadenopathy or petechiae. Remainder of his review of systems unremarkable.     Medications: I have reviewed the patient's current medications. Current Outpatient Prescriptions  Medication Sig Dispense Refill  . calcium carbonate (OS-CAL) 1250 MG chewable tablet Chew 1 tablet by mouth 2 (two) times daily.     [name] enoxaparin (LOVENOX) 100 MG/ML injection INJECT 0.9 MLS (90 MG TOTAL) INTO THE SKIN DAILY.  60 mL PRN  . leuprolide (LUPRON) 30 MG injection Inject 30 mg into the muscle every 4 (four) months.     . lidocaine-prilocaine (EMLA) cream Apply topically as needed. (Patient taking differently: Apply 1 application topically as needed (for port). ) 1 g 2  . loratadine (CLARITIN) 10 MG tablet Take 10 mg by mouth daily.    [name] MYRBETRIQ 50 MG TB24 tablet Take 50 mg by mouth daily.     . naproxen sodium (ANAPROX) 220 MG tablet Take 220 mg by mouth 2 (two) times daily with a meal.    . olaparib (LYNPARZA) 50 MG capsule Take 400 mg by mouth 2 (two) times daily. Swallow whole.    . ondansetron (ZOFRAN) 8 MG tablet Take 1 tablet (8 mg total) by mouth every 8 (eight) hours as needed for nausea or vomiting. 20 tablet 3  . oxyCODONE (OXY IR/ROXICODONE) 5 MG immediate release tablet Take 1 tablet (5 mg total) by mouth every 4 (four) hours as needed for severe pain. 60 tablet 0   No current facility-administered medications for this visit.    Facility-Administered Medications Ordered in Other Visits  Medication Dose Route Frequency Provider Last Rate Last Dose  . denosumab (XGEVA) injection 120 mg  120 mg Subcutaneous Once [name], MD        Allergies: No Known Allergies  Past Medical History, Surgical history, Social history, and Family History were reviewed and updated.    Physical Exam: Blood pressure 119/65, pulse 69, temperature 99.6 F (37.6 C), temperature source Oral, resp. rate 18, height 5\' 8"  (1.727 m), weight 201 lb 1.6 oz (91.2 kg), SpO2 100 %. ECOG: 1 General appearance: Chronically ill-appearing gentleman without distress. Head: Normocephalic, without obvious abnormality no oral ulcers or lesions. Slightly bulging off his left by noted. Neck: no adenopathy Lymph nodes: Cervical, supraclavicular, and axillary nodes normal. Heart:regular rate and rhythm, S1, S2 normal, no murmur, click, rub or gallop Lung: clear, no wheezing, rales. No dullness to percussion. Chest wall examination: Right anterior chest porta cath without erythema or induration. Abdomen: soft, non-tender, without masses or organomegaly no rebound or guarding. EXT: Edema noted bilaterally.1+ unchanged. Neurological examination: No deficits noted. Cranial nerves were all intact.  Lab Results: Lab Results  Component Value Date   WBC 3.6 (L) 05/25/2016   HGB 9.0 (L) 05/25/2016   HCT  27.8 (L) 05/25/2016   MCV 92.4 05/25/2016   PLT 145 05/25/2016     Chemistry      Component Value Date/Time   NA 141 05/25/2016 0929   K 4.5 05/25/2016 0929   CL 110 07/05/2015 0135   CL 108 (H) 03/09/2013 0923   CO2 26 05/25/2016 0929   BUN 17.1 05/25/2016 0929   CREATININE 1.1 05/25/2016 0929      Component Value Date/Time   CALCIUM 9.3 05/25/2016 0929   ALKPHOS 32 (L) 05/25/2016 0929   AST 13 05/25/2016 0929   ALT <9 05/25/2016 0929   BILITOT 0.30 05/25/2016 0929       Results for [name], [name] (MRN [id number]) as of 05/26/2016 10:11  Ref. Range 05/25/2016 09:29  PSA Latest Ref Range: 0.0 - 4.0 ng/mL 2,586.0 (H)     PSA on 04/05/2016 was 2500.    Impression and Plan:  This is a 70 year old gentleman with the following issues: 1. Castration-resistant prostate cancer with pelvic adenopathy. He has progressed on multiple therapies discussed above. He is currently on PARP inhibitor Lonie Peak- olaparib) since July 2016 and  tolerated it well. His PSA Is relatively stable compared to June 2017. His last CT scan in June 2017 was not dramatically changed. He has a follow-up CT scan in September 2017 at [facility]. I recommended continuing the current therapy and used a different salvage therapy if he has symptomatic progression. 2. IV access: Status post right anterior chest Port-A-Cath placement on 02/06/2014. This will be flushed every 6 weeks. 3. Hydronephrosis: He is S/P stent exchange done on 10/27/2015. 4. Hormonal deprivation.  He continues to be on Lupron. Lupron will be given after 08/07/2016. 5. 3.8 cm enhancing mass in the upper pole of the left kidney likely renal cell carcinoma. Dramatically changed from previous imaging studies. 6. Bone Directed therapy: He is currently on Xgeva and he will receive that on 05/26/2016 and repeated in 6 weeks. 7. Pulmonary embolism: He is currently on Lovenox and will require lifetime  anticoagulation. 8. Abdominal pain: Lower pelvic fullness related to his malignancy. He takes oxycodone with relief of the pain. This was refilled for him today. 9. Follow-up: Will be in 6 weeks.   [facility], MD 8/16/201710:07 AM

## 2016-05-27 DIAGNOSIS — C7951 Secondary malignant neoplasm of bone: Secondary | ICD-10-CM | POA: Insufficient documentation

## 2016-06-02 ENCOUNTER — Other Ambulatory Visit: Payer: Self-pay | Admitting: *Deleted

## 2016-06-02 MED ORDER — ONDANSETRON HCL 8 MG PO TABS
8.0000 mg | ORAL_TABLET | Freq: Three times a day (TID) | ORAL | 1 refills | Status: DC | PRN
Start: 2016-06-02 — End: 2016-06-08

## 2016-06-08 ENCOUNTER — Other Ambulatory Visit: Payer: Self-pay | Admitting: Oncology

## 2016-06-10 ENCOUNTER — Encounter: Payer: Self-pay | Admitting: *Deleted

## 2016-07-06 ENCOUNTER — Ambulatory Visit (HOSPITAL_BASED_OUTPATIENT_CLINIC_OR_DEPARTMENT_OTHER): Payer: Medicare Other

## 2016-07-06 ENCOUNTER — Other Ambulatory Visit (HOSPITAL_BASED_OUTPATIENT_CLINIC_OR_DEPARTMENT_OTHER): Payer: Medicare Other

## 2016-07-06 DIAGNOSIS — C61 Malignant neoplasm of prostate: Secondary | ICD-10-CM | POA: Diagnosis present

## 2016-07-06 DIAGNOSIS — Z95828 Presence of other vascular implants and grafts: Secondary | ICD-10-CM

## 2016-07-06 LAB — COMPREHENSIVE METABOLIC PANEL WITH GFR
ALT: 10 U/L (ref 0–55)
AST: 12 U/L (ref 5–34)
Albumin: 3.3 g/dL — ABNORMAL LOW (ref 3.5–5.0)
Alkaline Phosphatase: 35 U/L — ABNORMAL LOW (ref 40–150)
Anion Gap: 10 meq/L (ref 3–11)
BUN: 13 mg/dL (ref 7.0–26.0)
CO2: 21 meq/L — ABNORMAL LOW (ref 22–29)
Calcium: 8 mg/dL — ABNORMAL LOW (ref 8.4–10.4)
Chloride: 105 meq/L (ref 98–109)
Creatinine: 0.8 mg/dL (ref 0.7–1.3)
EGFR: >90 mL/min/{1.73_m2}
Glucose: 123 mg/dL (ref 70–140)
Potassium: 4.8 meq/L (ref 3.5–5.1)
Sodium: 137 meq/L (ref 136–145)
Total Bilirubin: <0.3 mg/dL (ref 0.20–1.20)
Total Protein: 6.4 g/dL (ref 6.4–8.3)

## 2016-07-06 LAB — CBC WITH DIFFERENTIAL/PLATELET
BASO%: 0 % (ref 0.0–2.0)
Basophils Absolute: 0 10*3/uL (ref 0.0–0.1)
EOS%: 0 % (ref 0.0–7.0)
Eosinophils Absolute: 0 10*3/uL (ref 0.0–0.5)
HCT: 28.8 % — ABNORMAL LOW (ref 38.4–49.9)
HGB: 9.5 g/dL — ABNORMAL LOW (ref 13.0–17.1)
LYMPH%: 7 % — ABNORMAL LOW (ref 14.0–49.0)
MCH: 30 pg (ref 27.2–33.4)
MCHC: 33 g/dL (ref 32.0–36.0)
MCV: 90.9 fL (ref 79.3–98.0)
MONO#: 0.3 10*3/uL (ref 0.1–0.9)
MONO%: 6.8 % (ref 0.0–14.0)
NEUT#: 4.3 10*3/uL (ref 1.5–6.5)
NEUT%: 86.2 % — ABNORMAL HIGH (ref 39.0–75.0)
Platelets: 159 10*3/uL (ref 140–400)
RBC: 3.17 10*6/uL — ABNORMAL LOW (ref 4.20–5.82)
RDW: 19.5 % — ABNORMAL HIGH (ref 11.0–14.6)
WBC: 5 10*3/uL (ref 4.0–10.3)
lymph#: 0.4 10*3/uL — ABNORMAL LOW (ref 0.9–3.3)
nRBC: 0 % (ref 0–0)

## 2016-07-06 MED ORDER — HEPARIN SOD (PORK) LOCK FLUSH 100 UNIT/ML IV SOLN
500.0000 [IU] | Freq: Once | INTRAVENOUS | Status: AC | PRN
  Administered 2016-07-06: 500 [IU] via INTRAVENOUS
  Filled 2016-07-06: qty 5

## 2016-07-06 MED ORDER — SODIUM CHLORIDE 0.9 % IJ SOLN
10.0000 mL | INTRAMUSCULAR | Status: DC | PRN
  Administered 2016-07-06: 10 mL via INTRAVENOUS
  Filled 2016-07-06: qty 10

## 2016-07-06 NOTE — Patient Instructions (Signed)

## 2016-07-07 LAB — PSA: Prostate Specific Ag, Serum: 3374 ng/mL — ABNORMAL HIGH (ref 0.0–4.0)

## 2016-07-08 ENCOUNTER — Ambulatory Visit (HOSPITAL_BASED_OUTPATIENT_CLINIC_OR_DEPARTMENT_OTHER): Payer: Medicare Other | Admitting: Oncology

## 2016-07-08 ENCOUNTER — Ambulatory Visit (HOSPITAL_BASED_OUTPATIENT_CLINIC_OR_DEPARTMENT_OTHER): Payer: Medicare Other

## 2016-07-08 ENCOUNTER — Telehealth: Payer: Self-pay | Admitting: Oncology

## 2016-07-08 VITALS — BP 127/72 | HR 66 | Temp 98.3°F | Resp 18

## 2016-07-08 VITALS — BP 128/61 | HR 67 | Temp 98.2°F | Resp 18 | Ht 68.0 in | Wt 204.6 lb

## 2016-07-08 DIAGNOSIS — R109 Unspecified abdominal pain: Secondary | ICD-10-CM | POA: Diagnosis not present

## 2016-07-08 DIAGNOSIS — C61 Malignant neoplasm of prostate: Secondary | ICD-10-CM | POA: Diagnosis present

## 2016-07-08 DIAGNOSIS — Z7901 Long term (current) use of anticoagulants: Secondary | ICD-10-CM

## 2016-07-08 DIAGNOSIS — C7931 Secondary malignant neoplasm of brain: Secondary | ICD-10-CM

## 2016-07-08 DIAGNOSIS — C7951 Secondary malignant neoplasm of bone: Secondary | ICD-10-CM | POA: Diagnosis not present

## 2016-07-08 DIAGNOSIS — I2699 Other pulmonary embolism without acute cor pulmonale: Secondary | ICD-10-CM | POA: Diagnosis not present

## 2016-07-08 DIAGNOSIS — E291 Testicular hypofunction: Secondary | ICD-10-CM

## 2016-07-08 DIAGNOSIS — Z95828 Presence of other vascular implants and grafts: Secondary | ICD-10-CM

## 2016-07-08 MED ORDER — ALTEPLASE 2 MG IJ SOLR
2.0000 mg | Freq: Once | INTRAMUSCULAR | Status: DC | PRN
  Filled 2016-07-08: qty 2

## 2016-07-08 MED ORDER — DENOSUMAB 120 MG/1.7ML ~~LOC~~ SOLN
120.0000 mg | Freq: Once | SUBCUTANEOUS | Status: AC
  Administered 2016-07-08: 120 mg via SUBCUTANEOUS
  Filled 2016-07-08: qty 1.7

## 2016-07-08 MED ORDER — HYDROMORPHONE HCL 4 MG PO TABS: 4.0000 mg | ORAL_TABLET | ORAL | 0 refills | Status: DC | PRN

## 2016-07-08 NOTE — Telephone Encounter (Signed)
Avs report and appointment schedule given to patient, per 07/08/16 los. °

## 2016-07-08 NOTE — Patient Instructions (Signed)
Denosumab injection  What is this medicine?  DENOSUMAB (den oh sue mab) slows bone breakdown. Prolia is used to treat osteoporosis in women after menopause and in men. Xgeva is used to prevent bone fractures and other bone problems caused by cancer bone metastases. Xgeva is also used to treat giant cell tumor of the bone.  This medicine may be used for other purposes; ask your health care provider or pharmacist if you have questions.  What should I tell my health care provider before I take this medicine?  They need to know if you have any of these conditions:  -dental disease  -eczema  -infection or history of infections  -kidney disease or on dialysis  -low blood calcium or vitamin D  -malabsorption syndrome  -scheduled to have surgery or tooth extraction  -taking medicine that contains denosumab  -thyroid or parathyroid disease  -an unusual reaction to denosumab, other medicines, foods, dyes, or preservatives  -pregnant or trying to get pregnant  -breast-feeding  How should I use this medicine?  This medicine is for injection under the skin. It is given by a health care professional in a hospital or clinic setting.  If you are getting Prolia, a special MedGuide will be given to you by the pharmacist with each prescription and refill. Be sure to read this information carefully each time.  For Prolia, talk to your pediatrician regarding the use of this medicine in children. Special care may be needed. For Xgeva, talk to your pediatrician regarding the use of this medicine in children. While this drug may be prescribed for children as young as 13 years for selected conditions, precautions do apply.  Overdosage: If you think you have taken too much of this medicine contact a poison control center or emergency room at once.  NOTE: This medicine is only for you. Do not share this medicine with others.  What if I miss a dose?  It is important not to miss your dose. Call your doctor or health care professional if you are  unable to keep an appointment.  What may interact with this medicine?  Do not take this medicine with any of the following medications:  -other medicines containing denosumab  This medicine may also interact with the following medications:  -medicines that suppress the immune system  -medicines that treat cancer  -steroid medicines like prednisone or cortisone  This list may not describe all possible interactions. Give your health care provider a list of all the medicines, herbs, non-prescription drugs, or dietary supplements you use. Also tell them if you smoke, drink alcohol, or use illegal drugs. Some items may interact with your medicine.  What should I watch for while using this medicine?  Visit your doctor or health care professional for regular checks on your progress. Your doctor or health care professional may order blood tests and other tests to see how you are doing.  Call your doctor or health care professional if you get a cold or other infection while receiving this medicine. Do not treat yourself. This medicine may decrease your body's ability to fight infection.  You should make sure you get enough calcium and vitamin D while you are taking this medicine, unless your doctor tells you not to. Discuss the foods you eat and the vitamins you take with your health care professional.  See your dentist regularly. Brush and floss your teeth as directed. Before you have any dental work done, tell your dentist you are receiving this medicine.  Do   not become pregnant while taking this medicine or for 5 months after stopping it. Women should inform their doctor if they wish to become pregnant or think they might be pregnant. There is a potential for serious side effects to an unborn child. Talk to your health care professional or pharmacist for more information.  What side effects may I notice from receiving this medicine?  Side effects that you should report to your doctor or health care professional as soon as  possible:  -allergic reactions like skin rash, itching or hives, swelling of the face, lips, or tongue  -breathing problems  -chest pain  -fast, irregular heartbeat  -feeling faint or lightheaded, falls  -fever, chills, or any other sign of infection  -muscle spasms, tightening, or twitches  -numbness or tingling  -skin blisters or bumps, or is dry, peels, or red  -slow healing or unexplained pain in the mouth or jaw  -unusual bleeding or bruising  Side effects that usually do not require medical attention (Report these to your doctor or health care professional if they continue or are bothersome.):  -muscle pain  -stomach upset, gas  This list may not describe all possible side effects. Call your doctor for medical advice about side effects. You may report side effects to FDA at 1-800-FDA-1088.  Where should I keep my medicine?  This medicine is only given in a clinic, doctor's office, or other health care setting and will not be stored at home.  NOTE: This sheet is a summary. It may not cover all possible information. If you have questions about this medicine, talk to your doctor, pharmacist, or health care provider.      2016, Elsevier/Gold Standard. (2012-03-27 12:37:47)

## 2016-07-08 NOTE — Progress Notes (Signed)
Hematology and Oncology Follow Up Visit  [name] [id number] 12/31/1945 70 y.o. 07/08/2016 9:48 AM   [name], [name]  [name], [name]  Principle Diagnosis: This is a 70 year old gentleman with prostate cancer initially diagnosed in 2001.  He had a Gleason score of 3 + 3 = 6.  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     Prior Therapy:  1. Status post prostatectomy done in May 2001.  He had T3 N1 disease.  PSA nadir to 0.  2. The patient developed biochemical relapse and received prostatic bed radiation.  3. The patient received Lupron with Casodex due to rising PSA.  The patient subsequently developed castration-resistant disease. 4. Patient treated with Casodex withdrawal and subsequently PSA rose to 18. 5. Patient with Provenge immunotherapy completed in July 2011. 6.  He received ketoconazole and prednisone December 2011 through January 2014. 7.         He is S/P Zytiga 1000 mg daily with Prednisone 5 mg daily on 11/03/12 till 07/2013. This was stopped due to progression of disease.  8.  Xtandi 1000 mg daily 07/2013 through 01/24/2014. Discontinued secondary to disease progression 9.         Docetaxel 75 mg/m2 with Neulasta support, status post 5 cycles. Discontinued 06/05/2014 secondary to disease progression. 10.      Systemic chemotherapy with Jevtana 25 mg/m2 given every 3 weeks. This was dose reduced to 20 mg started on cycle 12. Status post 15 cycles. Therapy discontinued due to progression of disease.  Current therapy: 1. Lynparza (olaparib) 400 mg po BID. Therapy started around 05/04/2015. He is receiving at [facility] 2. He is on Lupron 30 mg every 4 months. This will be given on June 2017. This will be repeated after 08/07/2016. 3. Xgeva given every 6 weeks.This will be given on 07/08/2016. 4. He is on Lovenox for full dose anticoagulation for pulmonary embolism. He is currently on 90  mg daily.  Interim History: [name] presents today for a followup visit with his wife. 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 He had developed a vision loss and headaches prior to that and subsequently obtained an MRI at [facility] which confirmed these findings. He is currently receiving palliative radiation therapy to the whole brain with 5 more fractions left.   His overall health have been declining and he lost vision in his left eye. He does not report any headaches at this time or any other neurological deficits. He is still ambulating with the help of a cane but his performance status is declining. His appetite remains reasonable but lost 2 pounds since the last visit.  He reports that he has stopped Olaparib although his CT scan in September 2017 did not show any major progression of disease. He continues to have back pain and abdominal pain issues that is not managed well with oxycodone. He does not take any oxycodone during the daytime. He feels that his pain is manageable during the day but worse at nighttime.  He does not report any syncope or seizures. He denied any other neurological deficits. He does not report any chest pain, palpitation or orthopnea. He does not report any cough, hemoptysis or hematemesis. Does not report any constipation, diarrhea or early satiety. He does not report any arthralgias or myalgias. He does not  report any lymphadenopathy or petechiae. Remainder of his review of systems unremarkable.     Medications: I have reviewed the patient's current medications. Current Outpatient Prescriptions  Medication Sig Dispense Refill  . calcium carbonate (OS-CAL) 1250 MG chewable tablet Chew 1 tablet by mouth 2 (two) times daily.     [name] dexamethasone (DECADRON) 4 MG tablet Take 8 mg by mouth.    . enoxaparin (LOVENOX) 100 MG/ML injection INJECT  0.9 MLS (90 MG TOTAL) INTO THE SKIN DAILY. 60 mL PRN  . leuprolide (LUPRON) 30 MG injection Inject 30 mg into the muscle every 4 (four) months.     . lidocaine-prilocaine (EMLA) cream Apply topically as needed. (Patient taking differently: Apply 1 application topically as needed (for port). ) 1 g 2  . loratadine (CLARITIN) 10 MG tablet Take 10 mg by mouth daily.    [name] MYRBETRIQ 50 MG TB24 tablet Take 50 mg by mouth daily.    . naproxen sodium (ANAPROX) 220 MG tablet Take 220 mg by mouth 2 (two) times daily with a meal.    . olaparib (LYNPARZA) 50 MG capsule Take 400 mg by mouth 2 (two) times daily. Swallow whole.    . ondansetron (ZOFRAN) 8 MG tablet TAKE 1 TABLET BY MOUTH EVERY 8 HOURS AS NEEDED FOR NAUSEA OR VOMITING. 30 tablet 1  . pantoprazole (PROTONIX) 40 MG tablet Take 40 mg by mouth.    [name] HYDROmorphone (DILAUDID) 4 MG tablet Take 1 tablet (4 mg total) by mouth every 4 (four) hours as needed for severe pain. 30 tablet 0   No current facility-administered medications for this visit.     Allergies: No Known Allergies  Past Medical History, Surgical history, Social history, and Family History were reviewed and updated.    Physical Exam: Blood pressure 128/61, pulse 67, temperature 98.2 F (36.8 C), temperature source Oral, resp. rate 18, height 5\' 8"  (1.727 m), weight 204 lb 9.6 oz (92.8 kg), SpO2 100 %. ECOG: 1 General appearance: Chronically ill-appearing gentleman. He appeared comfortable. His exophthalmos has improved. Head: Normocephalic, without obvious abnormality no oral ulcers or lesions.  Neck: no adenopathy Lymph nodes: Cervical, supraclavicular, and axillary nodes normal. Heart:regular rate and rhythm, S1, S2 normal, no murmur, click, rub or gallop Lung: clear, no wheezing, rales. No dullness to percussion. Chest wall examination: Right anterior chest porta cath without erythema or induration. Abdomen: soft, non-tender, without masses or organomegaly no rebound or  guarding. EXT: Bilateral edema noted. Neurological examination: No deficits noted. Cranial nerves were all intact.  Lab Results: Lab Results  Component Value Date   WBC 5.0 07/06/2016   HGB 9.5 (L) 07/06/2016   HCT 28.8 (L) 07/06/2016   MCV 90.9 07/06/2016   PLT 159 07/06/2016     Chemistry      Component Value Date/Time   NA 137 07/06/2016 0944   K 4.8 07/06/2016 0944   CL 110 07/05/2015 0135   CL 108 (H) 03/09/2013 0923   CO2 21 (L) 07/06/2016 0944   BUN 13.0 07/06/2016 0944   CREATININE 0.8 07/06/2016 0944      Component Value Date/Time   CALCIUM 8.0 (L) 07/06/2016 0944   ALKPHOS 35 (L) 07/06/2016 0944   AST 12 07/06/2016 0944   ALT 10 07/06/2016 0944   BILITOT <0.30 07/06/2016 0944       Results for RYO, GARR (MRN [id number]) as of 07/08/2016 09:28  Ref. Range 05/25/2016 09:29 07/06/2016 09:44  PSA Latest Ref Range: 0.0 - 4.0 ng/mL  2,586.0 (H) 3,374.0 (H)        Impression and Plan:  This is a 70 year old gentleman with the following issues: 1. Castration-resistant prostate cancer with pelvic adenopathy. He has progressed on multiple therapies discussed above. He is currently on PARP inhibitor Lonie Peak- olaparib) since July 2016 and tolerated it well. His PSA continues to rise although last imaging studies in September 2017 showed stable disease. Per his report, this medication has been discontinued because of progression of disease. He continues to follow with [facility] for any further therapy. I anticipate given his overall progression of disease, and decline in his health that no further therapy will be recommended. I prepared him for this possibility as well as the possibility of hospice enrollment if his disease continues to progress. He understands that this cancer eventually will lead to more morbidity and ultimately death. 2. IV access: Status post right anterior chest Port-A-Cath placement on 02/06/2014. This will be flushed  every 6 weeks. 3. Hydronephrosis: He is S/P stent exchange done on 10/27/2015. 4. Hormonal deprivation.  He continues to be on Lupron. Lupron will be given in November 2017. We will continue to the time being but this will be discontinued if he enrolled in hospice. 5. 3.8 cm enhancing mass in the upper pole of the left kidney likely renal cell carcinoma. Dramatically changed from previous imaging studies. 6. Bone Directed therapy: He is currently on Xgeva and he will receive that on 05/26/2016 and repeated in 6 weeks. This will be discontinued in the future. 7. Pulmonary embolism: He is currently on Lovenox and will require lifetime anticoagulation. 8. Abdominal pain: Lower pelvic fullness related to his malignancy. Oxycodone is no longer effective and I instructed him to use Dilaudid instead especially at nighttime. 9. 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 10. Prognosis: He understands that his prognosis is poor with limited life expectancy. I also introduced the concept of hospice and the likelihood he will require hospice enrollment in the near future. He will consider that option with me know before the next visit if he reaches a decision. 11. Follow-up: Will be in 6 weeks.   [facility], MD 9/28/20179:48 AM

## 2016-08-15 ENCOUNTER — Emergency Department (HOSPITAL_COMMUNITY): Payer: Medicare Other

## 2016-08-15 ENCOUNTER — Encounter (HOSPITAL_COMMUNITY): Payer: Self-pay | Admitting: Emergency Medicine

## 2016-08-15 ENCOUNTER — Inpatient Hospital Stay (HOSPITAL_COMMUNITY)
Admission: EM | Admit: 2016-08-15 | Discharge: 2016-08-18 | DRG: 871 | Disposition: A | Discharge status: Hospice | Payer: Medicare Other | Attending: Pulmonary Disease | Admitting: Pulmonary Disease

## 2016-08-15 DIAGNOSIS — Y92009 Unspecified place in unspecified non-institutional (private) residence as the place of occurrence of the external cause: Secondary | ICD-10-CM

## 2016-08-15 DIAGNOSIS — R6521 Severe sepsis with septic shock: Secondary | ICD-10-CM | POA: Diagnosis present

## 2016-08-15 DIAGNOSIS — Z9221 Personal history of antineoplastic chemotherapy: Secondary | ICD-10-CM | POA: Diagnosis not present

## 2016-08-15 DIAGNOSIS — Z6825 Body mass index (BMI) 25.0-25.9, adult: Secondary | ICD-10-CM

## 2016-08-15 DIAGNOSIS — L0390 Cellulitis, unspecified: Secondary | ICD-10-CM | POA: Diagnosis present

## 2016-08-15 DIAGNOSIS — Z7901 Long term (current) use of anticoagulants: Secondary | ICD-10-CM | POA: Diagnosis not present

## 2016-08-15 DIAGNOSIS — Z923 Personal history of irradiation: Secondary | ICD-10-CM

## 2016-08-15 DIAGNOSIS — C7931 Secondary malignant neoplasm of brain: Secondary | ICD-10-CM | POA: Diagnosis present

## 2016-08-15 DIAGNOSIS — N390 Urinary tract infection, site not specified: Secondary | ICD-10-CM

## 2016-08-15 DIAGNOSIS — I10 Essential (primary) hypertension: Secondary | ICD-10-CM | POA: Diagnosis present

## 2016-08-15 DIAGNOSIS — G936 Cerebral edema: Secondary | ICD-10-CM | POA: Diagnosis present

## 2016-08-15 DIAGNOSIS — E872 Acidosis: Secondary | ICD-10-CM | POA: Diagnosis present

## 2016-08-15 DIAGNOSIS — R6520 Severe sepsis without septic shock: Secondary | ICD-10-CM

## 2016-08-15 DIAGNOSIS — W19XXXA Unspecified fall, initial encounter: Secondary | ICD-10-CM | POA: Diagnosis present

## 2016-08-15 DIAGNOSIS — L03116 Cellulitis of left lower limb: Secondary | ICD-10-CM | POA: Diagnosis present

## 2016-08-15 DIAGNOSIS — Z86711 Personal history of pulmonary embolism: Secondary | ICD-10-CM | POA: Diagnosis not present

## 2016-08-15 DIAGNOSIS — C7902 Secondary malignant neoplasm of left kidney and renal pelvis: Secondary | ICD-10-CM | POA: Diagnosis present

## 2016-08-15 DIAGNOSIS — C7951 Secondary malignant neoplasm of bone: Secondary | ICD-10-CM | POA: Diagnosis present

## 2016-08-15 DIAGNOSIS — E440 Moderate protein-calorie malnutrition: Secondary | ICD-10-CM | POA: Diagnosis present

## 2016-08-15 DIAGNOSIS — N170 Acute kidney failure with tubular necrosis: Secondary | ICD-10-CM | POA: Diagnosis present

## 2016-08-15 DIAGNOSIS — C799 Secondary malignant neoplasm of unspecified site: Secondary | ICD-10-CM | POA: Diagnosis not present

## 2016-08-15 DIAGNOSIS — Z9181 History of falling: Secondary | ICD-10-CM

## 2016-08-15 DIAGNOSIS — Z515 Encounter for palliative care: Secondary | ICD-10-CM | POA: Diagnosis not present

## 2016-08-15 DIAGNOSIS — E860 Dehydration: Secondary | ICD-10-CM | POA: Diagnosis present

## 2016-08-15 DIAGNOSIS — Z96653 Presence of artificial knee joint, bilateral: Secondary | ICD-10-CM | POA: Diagnosis present

## 2016-08-15 DIAGNOSIS — G9341 Metabolic encephalopathy: Secondary | ICD-10-CM | POA: Diagnosis present

## 2016-08-15 DIAGNOSIS — Z86718 Personal history of other venous thrombosis and embolism: Secondary | ICD-10-CM | POA: Diagnosis not present

## 2016-08-15 DIAGNOSIS — C61 Malignant neoplasm of prostate: Secondary | ICD-10-CM | POA: Diagnosis present

## 2016-08-15 DIAGNOSIS — I4510 Unspecified right bundle-branch block: Secondary | ICD-10-CM | POA: Diagnosis not present

## 2016-08-15 DIAGNOSIS — A419 Sepsis, unspecified organism: Secondary | ICD-10-CM | POA: Diagnosis present

## 2016-08-15 DIAGNOSIS — N179 Acute kidney failure, unspecified: Secondary | ICD-10-CM | POA: Diagnosis not present

## 2016-08-15 DIAGNOSIS — Z66 Do not resuscitate: Secondary | ICD-10-CM | POA: Diagnosis present

## 2016-08-15 DIAGNOSIS — L8990 Pressure ulcer of unspecified site, unspecified stage: Secondary | ICD-10-CM | POA: Insufficient documentation

## 2016-08-15 DIAGNOSIS — G4733 Obstructive sleep apnea (adult) (pediatric): Secondary | ICD-10-CM | POA: Diagnosis present

## 2016-08-15 DIAGNOSIS — M549 Dorsalgia, unspecified: Secondary | ICD-10-CM | POA: Diagnosis present

## 2016-08-15 DIAGNOSIS — Z79891 Long term (current) use of opiate analgesic: Secondary | ICD-10-CM

## 2016-08-15 DIAGNOSIS — Z79899 Other long term (current) drug therapy: Secondary | ICD-10-CM

## 2016-08-15 DIAGNOSIS — E875 Hyperkalemia: Secondary | ICD-10-CM | POA: Diagnosis present

## 2016-08-15 DIAGNOSIS — D638 Anemia in other chronic diseases classified elsewhere: Secondary | ICD-10-CM | POA: Diagnosis present

## 2016-08-15 DIAGNOSIS — L89004 Pressure ulcer of unspecified elbow, stage 4: Secondary | ICD-10-CM | POA: Diagnosis not present

## 2016-08-15 DIAGNOSIS — D6489 Other specified anemias: Secondary | ICD-10-CM | POA: Diagnosis present

## 2016-08-15 LAB — CBC WITH DIFFERENTIAL/PLATELET
BASOS ABS: 0 10*3/uL (ref 0.0–0.1)
Basophils Relative: 0 %
EOS ABS: 0 10*3/uL (ref 0.0–0.7)
Eosinophils Relative: 0 %
HEMATOCRIT: 24.3 % — AB (ref 39.0–52.0)
Hemoglobin: 8.4 g/dL — ABNORMAL LOW (ref 13.0–17.0)
LYMPHS PCT: 5 %
Lymphs Abs: 0.8 10*3/uL (ref 0.7–4.0)
MCH: 28.4 pg (ref 26.0–34.0)
MCHC: 34.6 g/dL (ref 30.0–36.0)
MCV: 82.1 fL (ref 78.0–100.0)
MONO ABS: 1.3 10*3/uL — AB (ref 0.1–1.0)
Monocytes Relative: 8 %
NEUTROS PCT: 87 %
Neutro Abs: 13.9 10*3/uL — ABNORMAL HIGH (ref 1.7–7.7)
Platelets: 273 10*3/uL (ref 150–400)
RBC: 2.96 MIL/uL — AB (ref 4.22–5.81)
RDW: 17.2 % — ABNORMAL HIGH (ref 11.5–15.5)
WBC: 16 10*3/uL — AB (ref 4.0–10.5)

## 2016-08-15 LAB — I-STAT CHEM 8, ED
BUN: 95 mg/dL — AB (ref 6–20)
CHLORIDE: 104 mmol/L (ref 101–111)
Calcium, Ion: 0.75 mmol/L — CL (ref 1.15–1.40)
Creatinine, Ser: 6.2 mg/dL — ABNORMAL HIGH (ref 0.61–1.24)
Glucose, Bld: 101 mg/dL — ABNORMAL HIGH (ref 65–99)
HEMATOCRIT: 28 % — AB (ref 39.0–52.0)
Hemoglobin: 9.5 g/dL — ABNORMAL LOW (ref 13.0–17.0)
Potassium: 6.5 mmol/L (ref 3.5–5.1)
SODIUM: 129 mmol/L — AB (ref 135–145)
TCO2: 16 mmol/L (ref 0–100)

## 2016-08-15 LAB — COMPREHENSIVE METABOLIC PANEL
ALBUMIN: 2.7 g/dL — AB (ref 3.5–5.0)
ALT: 16 U/L — ABNORMAL LOW (ref 17–63)
ANION GAP: 20 — AB (ref 5–15)
AST: 34 U/L (ref 15–41)
Alkaline Phosphatase: 88 U/L (ref 38–126)
BUN: 110 mg/dL — AB (ref 6–20)
CHLORIDE: 96 mmol/L — AB (ref 101–111)
CO2: 13 mmol/L — ABNORMAL LOW (ref 22–32)
Calcium: 7.2 mg/dL — ABNORMAL LOW (ref 8.9–10.3)
Creatinine, Ser: 6.71 mg/dL — ABNORMAL HIGH (ref 0.61–1.24)
GFR calc Af Amer: 9 mL/min — ABNORMAL LOW (ref >60)
GFR calc non Af Amer: 7 mL/min — ABNORMAL LOW (ref >60)
GLUCOSE: 74 mg/dL (ref 65–99)
SODIUM: 129 mmol/L — AB (ref 135–145)
Total Bilirubin: 1.8 mg/dL — ABNORMAL HIGH (ref 0.3–1.2)
Total Protein: 6.1 g/dL — ABNORMAL LOW (ref 6.5–8.1)

## 2016-08-15 LAB — I-STAT VENOUS BLOOD GAS, ED
ACID-BASE DEFICIT: 10 mmol/L — AB (ref 0.0–2.0)
BICARBONATE: 16.1 mmol/L — AB (ref 20.0–28.0)
O2 Saturation: 36 %
TCO2: 17 mmol/L (ref 0–100)
pCO2, Ven: 36.8 mmHg — ABNORMAL LOW (ref 44.0–60.0)
pH, Ven: 7.251 (ref 7.250–7.430)
pO2, Ven: 25 mmHg — CL (ref 32.0–45.0)

## 2016-08-15 LAB — I-STAT CG4 LACTIC ACID, ED
LACTIC ACID, VENOUS: 4.57 mmol/L — AB (ref 0.5–1.9)
LACTIC ACID, VENOUS: 1.28 mmol/L (ref 0.5–1.9)

## 2016-08-15 LAB — PHOSPHORUS: Phosphorus: 8.2 mg/dL — ABNORMAL HIGH (ref 2.5–4.6)

## 2016-08-15 LAB — MAGNESIUM: Magnesium: 3.1 mg/dL — ABNORMAL HIGH (ref 1.7–2.4)

## 2016-08-15 LAB — PROTIME-INR
INR: 1.45
Prothrombin Time: 17.8 seconds — ABNORMAL HIGH (ref 11.4–15.2)

## 2016-08-15 LAB — BRAIN NATRIURETIC PEPTIDE: B Natriuretic Peptide: 54.5 pg/mL (ref 0.0–100.0)

## 2016-08-15 MED ORDER — PIPERACILLIN-TAZOBACTAM 3.375 G IVPB 30 MIN
3.3750 g | Freq: Once | INTRAVENOUS | Status: AC
  Administered 2016-08-15: 3.375 g via INTRAVENOUS
  Filled 2016-08-15: qty 50

## 2016-08-15 MED ORDER — SODIUM CHLORIDE 0.9 % IV SOLN: 250.0000 mL | INTRAVENOUS | Status: DC | PRN

## 2016-08-15 MED ORDER — SODIUM CHLORIDE 0.9 % IV BOLUS (SEPSIS)
1000.0000 mL | Freq: Once | INTRAVENOUS | Status: AC
  Administered 2016-08-15: 1000 mL via INTRAVENOUS

## 2016-08-15 MED ORDER — DEXAMETHASONE SODIUM PHOSPHATE 10 MG/ML IJ SOLN
10.0000 mg | Freq: Once | INTRAMUSCULAR | Status: AC
  Administered 2016-08-15: 10 mg via INTRAVENOUS
  Filled 2016-08-15: qty 1

## 2016-08-15 MED ORDER — SODIUM POLYSTYRENE SULFONATE 15 GM/60ML PO SUSP
30.0000 g | Freq: Once | ORAL | Status: AC
  Administered 2016-08-15: 30 g via ORAL
  Filled 2016-08-15: qty 120

## 2016-08-15 MED ORDER — VANCOMYCIN HCL IN DEXTROSE 1-5 GM/200ML-% IV SOLN
1000.0000 mg | Freq: Once | INTRAVENOUS | Status: AC
  Administered 2016-08-15: 1000 mg via INTRAVENOUS
  Filled 2016-08-15: qty 200

## 2016-08-15 MED ORDER — SODIUM CHLORIDE 0.9 % IV SOLN
1.0000 g | Freq: Once | INTRAVENOUS | Status: AC
  Administered 2016-08-15: 1 g via INTRAVENOUS
  Filled 2016-08-15 (×2): qty 10

## 2016-08-15 MED ORDER — ALBUTEROL (5 MG/ML) CONTINUOUS INHALATION SOLN
INHALATION_SOLUTION | RESPIRATORY_TRACT | Status: AC
  Filled 2016-08-15: qty 20

## 2016-08-15 MED ORDER — ALBUTEROL (5 MG/ML) CONTINUOUS INHALATION SOLN
20.0000 mg/h | INHALATION_SOLUTION | Freq: Once | RESPIRATORY_TRACT | Status: AC
  Administered 2016-08-15: 20 mg/h via RESPIRATORY_TRACT

## 2016-08-15 MED ORDER — STERILE WATER FOR INJECTION IV SOLN
INTRAVENOUS | Status: DC
  Administered 2016-08-16: 01:00:00 via INTRAVENOUS
  Filled 2016-08-15 (×3): qty 850

## 2016-08-15 MED ORDER — CALCIUM GLUCONATE 10 % IV SOLN
1.0000 g | Freq: Once | INTRAVENOUS | Status: AC
  Administered 2016-08-15: 1 g via INTRAVENOUS
  Filled 2016-08-15: qty 10

## 2016-08-15 MED ORDER — INSULIN ASPART 100 UNIT/ML ~~LOC~~ SOLN
5.0000 [IU] | Freq: Once | SUBCUTANEOUS | Status: AC
  Administered 2016-08-15: 5 [IU] via INTRAVENOUS
  Filled 2016-08-15: qty 1

## 2016-08-15 MED ORDER — NOREPINEPHRINE BITARTRATE 1 MG/ML IV SOLN
0.0000 ug/min | Freq: Once | INTRAVENOUS | Status: DC
  Filled 2016-08-15: qty 4

## 2016-08-15 MED ORDER — DEXTROSE 50 % IV SOLN
1.0000 | Freq: Once | INTRAVENOUS | Status: AC
  Administered 2016-08-15: 50 mL via INTRAVENOUS
  Filled 2016-08-15: qty 50

## 2016-08-15 MED ORDER — DEXTROSE 5 % IV SOLN
0.0000 ug/min | INTRAVENOUS | Status: DC
  Administered 2016-08-16: 3 ug/min via INTRAVENOUS
  Administered 2016-08-16: 4 ug/min via INTRAVENOUS
  Administered 2016-08-17: 2 ug/min via INTRAVENOUS
  Filled 2016-08-15 (×4): qty 4

## 2016-08-15 NOTE — Consult Note (Signed)
Renal Service Consult Note [facility]  [name]. 08/15/2016 Roney Jaffe D Requesting Physician:  Dr. Billy Fischer  Reason for Consult:  Acute renal failure HPI: The patient is a 70 y.o. year-old with history of HTN and metatstatic prostate cancer.  He presents to ED after fall at home this am.  He was found on the floor and placed back in bed.  When he awoke hours later had AMS.  EMS brought to ED.  In ED K>7.5, EKG new RBBB.  Getting IV Ca, ins/ glu, nebs now.  BUN 110, creat 6.71.  BP's were 60's on presentation, now 90's after 3L NS.  WBC 16k, Hb 8.4  plt 273.  Creat was 0.8 when here in Sept (07/06/16). Condom cath placed starting to drain murky brown urine.   Patient very weak and confused. Pt's wife provided history of poor appetite for weeks maybe months.  Has not been eating.  Had N/V today and yesterday, no sig diarrhea.  No sig abd pain, fevers, chills or sweats.  Has had problems with L leg pain, redness and swelling recently.   No OTC meds, no nsaids.   Home meds >  Lupron, olaparib, Myrbetriq, Zofran, oxycodone, PPI, dilaudid po, lovenox SQ , Oscal  Hx prostate Ca dx'd in May 2001, T3 N1 disease.  1) Prostatectomy 2001 2) relapsed and rec'd prostatic bed XRT 3) relapsed and rec'd Lupron w Casodex 4) relapsed and rec'd Provenge immunoRx 2011 5) ketoconazole/ prednisone 2011- 14 6) Zytiga / pred Jan > Oct 2014, stopped due to progression 7) Xtandi Oct 14 > Apr 15, dc'd due to progression 8) Docetaxel / Jevtana chemoRx, dc'd 8/15 due to progression 9) Jevtana systemic chemoRx 15 cycles, dc'd due to progression 10) current Rx = olaparib 400 mg po bid, started 7/16; +Lupron 30 mg every 4 mos  Hx DVT / submassive PE , on full dose lovenox.      Past Medical History  Past Medical History:  Diagnosis Date  . Chemotherapy-induced nausea   . ED (erectile dysfunction)   . Fatigue   . History of acute renal failure    W/ SEPSIS  MARCH 2014  . History  of DVT (deep vein thrombosis)    dec 2009  . History of pulmonary embolus (PE)    dec 2009  . Hydronephrosis, right   . Hypertension   . Left renal mass    MONITORED BY UROLOGIST-  DR Diona Fanti  . Metastasis to bone [facility])    PRIMARY PROSTATE CANCER- remains with oral Chemotherapy at present  . OSA on CPAP   . Osteoarthritis   . Port-a-cath in place    right chest-"Power port"  . Prostate cancer ([place]) ONCOLOGIST-  DR SHADAD   DX 2001 (T3 N1)-- S/P PROSTATECTOMY--/ BIOCHEMICAL RELAPSE, RECEIVED PROSTATIC BED RADIATION AND LUPRON WITH CASODEX   SINCE DEVELOPED  CASTATION RESISITENT METASTATIC PROSTATE ADENOCARINOMA--  CURRENT THERAPY CHEMOTHEAPY EVERY 3 WEEKS AND LUPRON INJECTIONS EVERY 4 MONTHS  . Retroperitoneal lymphadenopathy   . Urgency of urination    Past Surgical History  Past Surgical History:  Procedure Laterality Date  . COLONOSCOPY W/ POLYPECTOMY  05-23-2013  . CYSTOSCOPY W/ URETERAL STENT PLACEMENT Right 10/22/2013   Procedure: CYSTOSCOPY WITH STENT REPLACEMENT;  Surgeon: Franchot Gallo, MD;  Location[facility];  Service: Urology;  Laterality: Right;  . CYSTOSCOPY W/ URETERAL STENT PLACEMENT Right 06/24/2014   Procedure: CYSTOSCOPY WITH RETROGRADE PYELOGRAM/ URETERAL STENT REPLACEMENT WITH URETERAL DILATION, RIGHT;  Surgeon: Annie Main  Faythe Casa, MD;  Location: [facility];  Service: Urology;  Laterality: Right;  . CYSTOSCOPY W/ URETERAL STENT PLACEMENT Right 11/22/2014   Procedure: CYSTOSCOPY WITH STENT REPLACEMENT;  Surgeon: Jorja Loa, MD;  Location: [facility];  Service: Urology;  Laterality: Right;  . CYSTOSCOPY WITH URETEROSCOPY AND STENT PLACEMENT Right 10/27/2015   Procedure: CYSTOSCOPY AND RIGHT STENT PLACEMENT;  Surgeon: Franchot Gallo, MD;  Location: [facility];  Service: Urology;  Laterality: Right;  . INGUINAL HERNIA REPAIR Right 2010  . INSERTION IVC FILTER AND REMOVAL  03-25-2009/   REMOVAL 05-29-2009  .  PENILE PROSTHESIS IMPLANT  2005  . PORTACATH PLACEMENT  02-06-2014  . PROSTATECTOMY  MAY 2001  . TOTAL KNEE ARTHROPLASTY Bilateral RIGHT  04-24-2009/   LEFT  2005  . TRANSTHORACIC ECHOCARDIOGRAM  12-27-2012   MILD LVH/  EF [id number]  GRADE I DIASTOLIC DYSFUNCTION   Family History  Family History  Problem Relation Age of Onset  . Heart disease Mother   . Colon cancer Neg Hx    Social History  reports that he has never smoked. He has never used smokeless tobacco. He reports that he does not drink alcohol or use drugs. Allergies No Known Allergies Home medications Prior to Admission medications   Medication Sig Start Date End Date Taking? Authorizing Provider  calcium carbonate (OS-CAL) 1250 MG chewable tablet Chew 1 tablet by mouth 2 (two) times daily.    Yes Historical Provider, MD  clotrimazole (LOTRIMIN) 1 % cream Apply 1 application topically 2 (two) times daily.   Yes Historical Provider, MD  enoxaparin (LOVENOX) 100 MG/ML injection INJECT 0.9 MLS (90 MG TOTAL) INTO THE SKIN DAILY. 01/15/16  Yes [name], MD  leuprolide (LUPRON) 30 MG injection Inject 30 mg into the muscle every 4 (four) months.    Yes Historical Provider, MD  lidocaine-prilocaine (EMLA) cream Apply topically as needed. Patient taking differently: Apply 1 application topically as needed (for port).  06/05/14  Yes [name], MD  loratadine (CLARITIN) 10 MG tablet Take 10 mg by mouth daily.   Yes Historical Provider, MD  meclizine (ANTIVERT) 25 MG tablet Take 25 mg by mouth 3 (three) times daily as needed for dizziness.   Yes Historical Provider, MD  MYRBETRIQ 50 MG TB24 tablet Take 50 mg by mouth daily. 12/17/15  Yes Historical Provider, MD  olaparib (LYNPARZA) 50 MG capsule Take 400 mg by mouth 2 (two) times daily. Swallow whole.   Yes Historical Provider, MD  ondansetron (ZOFRAN) 8 MG tablet TAKE 1 TABLET BY MOUTH EVERY 8 HOURS AS NEEDED FOR NAUSEA OR VOMITING. 06/08/16  Yes [name], MD  oxyCODONE (OXY  IR/ROXICODONE) 5 MG immediate release tablet Take 5 mg by mouth every 6 (six) hours.   Yes Historical Provider, MD  pantoprazole (PROTONIX) 40 MG tablet Take 40 mg by mouth. 07/01/16  Yes Historical Provider, MD  HYDROmorphone (DILAUDID) 4 MG tablet Take 1 tablet (4 mg total) by mouth every 4 (four) hours as needed for severe pain. Patient not taking: Reported on 08/15/2016 07/08/16   [name], MD   Liver Function Tests  Recent Labs Lab 08/15/16 2213  AST 34  ALT 16*  ALKPHOS 88  BILITOT 1.8*  PROT 6.1*  ALBUMIN 2.7*   No results for input(s): LIPASE, AMYLASE in the last 168 hours. CBC  Recent Labs Lab 08/15/16 1725 08/15/16 2130  WBC 16.0*  --   NEUTROABS 13.9*  --   HGB  8.4* 9.5*  HCT 24.3* 28.0*  MCV 82.1  --   PLT 273  --    Basic Metabolic Panel  Recent Labs Lab 08/15/16 2130 08/15/16 2213  NA 129* 129*  K 6.5* >7.5*  CL 104 96*  CO2  --  13*  GLUCOSE 101* 74  BUN 95* 110*  CREATININE 6.20* 6.71*  CALCIUM  --  7.2*  PHOS  --  8.2*   Iron/TIBC/Ferritin/ %Sat No results found for: IRON, TIBC, FERRITIN, IRONPCTSAT  Vitals:   08/15/16 2212 08/15/16 2215 08/15/16 2230 08/15/16 2235  BP:  97/75 100/59 (!) 87/61  Pulse:  101 103 103  Resp:  22 (!) 27 24  Temp:      TempSrc:      SpO2: 100% 100% 100% 100%  Weight:      Height:       Exam Gen very frail, weak and confused, awake, "1970", "[place]" No rash, cyanosis or gangrene Sclera anicteric, throat is dry and tongue red No jvd or bruits Chest clear bilat RRR soft SEM no RG Abd soft ntnd no mass or ascites +bs GU normal male w condom cath MS no joint effusions or deformity Ext 2+ diffuse bilat edema below the knees LLE erythematous and swollen/ tender, couple of ulcerations on the back of the LLE Neuro grips bilat, Ox 1, gen weakness   Assessment: 1. Acute renal failure - brown urine, small amts, ^^BUN/ Cr, hypotensive prob due to sepsis.  Suspect ATN, less likely GN assoc w  metastatic cancer.  Poor HD candidate, recommend supportive care, bladder scan, IV NaHCO3 and Rx of hyperkalemia w IV medications acutely and , if pt will tolerate, po kayexalate.  Prognosis poor.  2. Hyperkalemia - has new RBBB which may be due to hyperkalemia. Recommend usual IV Rx for ^^K+, see orders.   3. Shock/ hypotension - likely septic 4. Metastatic prostate cancer - failed mult regimens, dx'd in 2001 5. LLE cellulitis - per primary 6. Hx PE - on SQ lovenox at home  7. DNR     Plan - as above  Kelly Splinter MD [facility] pager [phone]   08/15/2016, 11:07 PM

## 2016-08-15 NOTE — ED Provider Notes (Signed)
[facility] DEPT Provider Note   CSN: [id number] Arrival date & time: 08/15/16  1708     History   Chief Complaint Chief Complaint  Patient presents with  . Altered Mental Status  . Fall    HPI Frederick Oscar Waynetta Pean. is a 70 y.o. male.  HPI    70 year old male with a history of prostate cancer initially diagnosed in 2001, with bony metastases, brain metastases with large mass in the left sphenoid bone and extension into the left orbit receiving whole brain radiation at [facility], likely renal cell carcinoma, PE on coumadin, chronic abdominal fullness from malignancy on home dilaudid, who presents with concern for altered mental status and fall.  Patient hypotensive on arrival to the [id number] systolic.  Wife reports that 3 days ago he began to develop erythema of his left lower extremity. Report that he was given an antifungal cream which they've been placing on it. He was in his normal state of health for the last 2 days.  Today he was found fallen, with head near bed rail, and seemed confused. Wife reports that he went to sleep for approximately 4-6 hours, and when he woke he continued to have confusion and had another fall. They deny known fevers. Report is in normal state of health last night when he went to bed. No recent medication changes. Reports that the family has had cough and congestion however he has not exhibited any of the symptoms. Wife denies that he's had urinary symptoms, headache, chest pain, shortness of breath. Patient at this time only complains of back pain which he and his wife reports is chronic. Wife also reports he had one episode of emesis today. She also reports he's had very little to eat or drink over the last several days.   Past Medical History:  Diagnosis Date  . Chemotherapy-induced nausea   . ED (erectile dysfunction)   . Fatigue   . History of acute renal failure    W/ SEPSIS  MARCH 2014  . History of DVT (deep vein thrombosis)    dec 2009  . History of  pulmonary embolus (PE)    dec 2009  . Hydronephrosis, right   . Hypertension   . Left renal mass    MONITORED BY UROLOGIST-  DR Diona Fanti  . Metastasis to bone [facility])    PRIMARY PROSTATE CANCER- remains with oral Chemotherapy at present  . OSA on CPAP   . Osteoarthritis   . Port-a-cath in place    right chest-"Power port"  . Prostate cancer ([place]) ONCOLOGIST-  DR SHADAD   DX 2001 (T3 N1)-- S/P PROSTATECTOMY--/ BIOCHEMICAL RELAPSE, RECEIVED PROSTATIC BED RADIATION AND LUPRON WITH CASODEX   SINCE DEVELOPED  CASTATION RESISITENT METASTATIC PROSTATE ADENOCARINOMA--  CURRENT THERAPY CHEMOTHEAPY EVERY 3 WEEKS AND LUPRON INJECTIONS EVERY 4 MONTHS  . Retroperitoneal lymphadenopathy   . Urgency of urination     Patient Active Problem List   Diagnosis Date Noted  . Pressure injury of skin 08/16/2016  . Hyperkalemia   . Cellulitis of left lower extremity   . Severe sepsis ([place]) 08/15/2016  . Secondary malignancy of bone of lower extremity ([place]) 05/27/2016  . Port catheter in place 05/25/2016  . Acute respiratory failure with hypoxia ([place])   . Pulmonary embolism ([place]) 07/04/2015  . Acute pulmonary embolism ([place]) 07/04/2015  . Thrombocytopenia, unspecified 12/20/2012  . Severe sepsis with septic shock ([place]) 12/20/2012  . Acute renal failure ([place]) 12/19/2012  . UTI (lower urinary tract infection) 12/19/2012  .  Dehydration 12/19/2012  . Diarrhea 12/19/2012  . Hypertension   . Sleep apnea   . Prostate cancer ([place]) 08/26/2011    Past Surgical History:  Procedure Laterality Date  . COLONOSCOPY W/ POLYPECTOMY  05-23-2013  . CYSTOSCOPY W/ URETERAL STENT PLACEMENT Right 10/22/2013   Procedure: CYSTOSCOPY WITH STENT REPLACEMENT;  Surgeon: Franchot Gallo, MD;  Location[facility];  Service: Urology;  Laterality: Right;  . CYSTOSCOPY W/ URETERAL STENT PLACEMENT Right 06/24/2014   Procedure: CYSTOSCOPY WITH RETROGRADE PYELOGRAM/ URETERAL STENT REPLACEMENT WITH URETERAL DILATION,  RIGHT;  Surgeon: Jorja Loa, MD;  Location: [facility];  Service: Urology;  Laterality: Right;  . CYSTOSCOPY W/ URETERAL STENT PLACEMENT Right 11/22/2014   Procedure: CYSTOSCOPY WITH STENT REPLACEMENT;  Surgeon: Jorja Loa, MD;  Location[facility];  Service: Urology;  Laterality: Right;  . CYSTOSCOPY WITH URETEROSCOPY AND STENT PLACEMENT Right 10/27/2015   Procedure: CYSTOSCOPY AND RIGHT STENT PLACEMENT;  Surgeon: Franchot Gallo, MD;  Location: [facility];  Service: Urology;  Laterality: Right;  . INGUINAL HERNIA REPAIR Right 2010  . INSERTION IVC FILTER AND REMOVAL  03-25-2009/   REMOVAL 05-29-2009  . PENILE PROSTHESIS IMPLANT  2005  . PORTACATH PLACEMENT  02-06-2014  . PROSTATECTOMY  MAY 2001  . TOTAL KNEE ARTHROPLASTY Bilateral RIGHT  04-24-2009/   LEFT  2005  . TRANSTHORACIC ECHOCARDIOGRAM  12-27-2012   MILD [facility]/  EF [id number]  GRADE I DIASTOLIC DYSFUNCTION       Home Medications    Prior to Admission medications   Medication Sig Start Date End Date Taking? Authorizing Provider  calcium carbonate (OS-CAL) 1250 MG chewable tablet Chew 1 tablet by mouth 2 (two) times daily.    Yes Historical Provider, MD  clotrimazole (LOTRIMIN) 1 % cream Apply 1 application topically 2 (two) times daily.   Yes Historical Provider, MD  enoxaparin (LOVENOX) 100 MG/ML injection INJECT 0.9 MLS (90 MG TOTAL) INTO THE SKIN DAILY. 01/15/16  Yes [name], MD  leuprolide (LUPRON) 30 MG injection Inject 30 mg into the muscle every 4 (four) months.    Yes Historical Provider, MD  lidocaine-prilocaine (EMLA) cream Apply topically as needed. Patient taking differently: Apply 1 application topically as needed (for port).  06/05/14  Yes [name], MD  loratadine (CLARITIN) 10 MG tablet Take 10 mg by mouth daily.   Yes Historical Provider, MD  meclizine (ANTIVERT) 25 MG tablet Take 25 mg by mouth 3 (three) times daily as needed for dizziness.   Yes Historical  Provider, MD  MYRBETRIQ 50 MG TB24 tablet Take 50 mg by mouth daily. 12/17/15  Yes Historical Provider, MD  olaparib (LYNPARZA) 50 MG capsule Take 400 mg by mouth 2 (two) times daily. Swallow whole.   Yes Historical Provider, MD  ondansetron (ZOFRAN) 8 MG tablet TAKE 1 TABLET BY MOUTH EVERY 8 HOURS AS NEEDED FOR NAUSEA OR VOMITING. 06/08/16  Yes [name], MD  oxyCODONE (OXY IR/ROXICODONE) 5 MG immediate release tablet Take 5 mg by mouth every 6 (six) hours.   Yes Historical Provider, MD  pantoprazole (PROTONIX) 40 MG tablet Take 40 mg by mouth. 07/01/16  Yes Historical Provider, MD    Family History Family History  Problem Relation Age of Onset  . Heart disease Mother   . Colon cancer Neg Hx     Social History Social History  Substance Use Topics  . Smoking status: Never Smoker  . Smokeless tobacco: Never Used  . Alcohol use  No     Comment: none now.     Allergies   Patient has no known allergies.   Review of Systems Review of Systems  Unable to perform ROS: Mental status change  Constitutional: Positive for activity change, appetite change and fatigue. Negative for fever.  HENT: Negative for sore throat.   Eyes: Positive for visual disturbance (chronic).  Respiratory: Negative for cough (coughed once a few dyas ago but no more) and shortness of breath.   Cardiovascular: Positive for leg swelling. Negative for chest pain.  Gastrointestinal: Positive for abdominal pain (chronic), nausea and vomiting (one episode today).  Genitourinary: Negative for difficulty urinating and dysuria.  Musculoskeletal: Positive for arthralgias and back pain (chronic). Negative for neck stiffness.  Skin: Positive for rash (3 days).  Neurological: Negative for syncope (unknown) and headaches.  Psychiatric/Behavioral: Positive for confusion.     Physical Exam Updated Vital Signs BP 106/71   Pulse 92   Temp 98.4 F (36.9 C) (Oral)   Resp (!) 25   Ht 5\' 11"  (1.803 m)   Wt 175 lb 14.8 oz  (79.8 kg)   SpO2 100%   BMI 24.54 kg/m   Physical Exam  Constitutional: He is oriented to person, place, and time. He appears well-developed and well-nourished. No distress.  HENT:  Head: Normocephalic and atraumatic.  Mouth/Throat: Mucous membranes are dry.  Eyes: Conjunctivae and EOM are normal.  Proptosis left eye Miosis bilaterally with minimal reaction, normal EOM  Neck: Normal range of motion.  Cardiovascular: Normal rate, regular rhythm, normal heart sounds and intact distal pulses.  Exam reveals no gallop and no friction rub.   No murmur heard. Pulmonary/Chest: Effort normal and breath sounds normal. No respiratory distress. He has no wheezes. He has no rales.  Abdominal: Soft. He exhibits no distension. There is no tenderness. There is no guarding.  Musculoskeletal: He exhibits edema (3+bilateral).       Cervical back: He exhibits no bony tenderness.       Thoracic back: He exhibits no bony tenderness.       Lumbar back: He exhibits bony tenderness.  Neurological: He is alert and oriented to person, place, and time.  Legs heavy and unable to lift bilaterally without assistance, able to wiggle toes bilaterally Arms without drift Difficulty following directions, easily distracted Symmetric facies, midline tongue and uvula Reports normal sensation  Skin: Skin is warm and dry. He is not diaphoretic. There is erythema (left lower extremity).  Nursing note and vitals reviewed.    ED Treatments / Results  Labs (all labs ordered are listed, but only abnormal results are displayed) Labs Reviewed  CBC WITH DIFFERENTIAL/PLATELET - Abnormal; Notable for the following:       Result Value   WBC 16.0 (*)    RBC 2.96 (*)    Hemoglobin 8.4 (*)    HCT 24.3 (*)    RDW 17.2 (*)    Neutro Abs 13.9 (*)    Monocytes Absolute 1.3 (*)    All other components within normal limits  URINALYSIS, ROUTINE W REFLEX MICROSCOPIC (NOT AT [facility]) - Abnormal; Notable for the following:    Color,  Urine AMBER (*)    APPearance TURBID (*)    Hgb urine dipstick LARGE (*)    Ketones, ur 15 (*)    Protein, ur 100 (*)    Leukocytes, UA MODERATE (*)    All other components within normal limits  PROTIME-INR - Abnormal; Notable for the following:    Prothrombin  Time 17.8 (*)    All other components within normal limits  COMPREHENSIVE METABOLIC PANEL - Abnormal; Notable for the following:    Sodium 129 (*)    Potassium >7.5 (*)    Chloride 96 (*)    CO2 13 (*)    BUN 110 (*)    Creatinine, Ser 6.71 (*)    Calcium 7.2 (*)    Total Protein 6.1 (*)    Albumin 2.7 (*)    ALT 16 (*)    Total Bilirubin 1.8 (*)    GFR calc non Af Amer 7 (*)    GFR calc Af Amer 9 (*)    Anion gap 20 (*)    All other components within normal limits  MAGNESIUM - Abnormal; Notable for the following:    Magnesium 3.1 (*)    All other components within normal limits  PHOSPHORUS - Abnormal; Notable for the following:    Phosphorus 8.2 (*)    All other components within normal limits  BASIC METABOLIC PANEL - Abnormal; Notable for the following:    Sodium 123 (*)    Potassium 5.5 (*)    Chloride 97 (*)    CO2 13 (*)    Glucose, Bld 486 (*)    BUN 84 (*)    Creatinine, Ser 4.95 (*)    Calcium 5.6 (*)    GFR calc non Af Amer 11 (*)    GFR calc Af Amer 12 (*)    All other components within normal limits  BASIC METABOLIC PANEL - Abnormal; Notable for the following:    Sodium 132 (*)    Potassium 5.8 (*)    CO2 14 (*)    Glucose, Bld 187 (*)    BUN 89 (*)    Creatinine, Ser 5.02 (*)    Calcium 5.9 (*)    GFR calc non Af Amer 11 (*)    GFR calc Af Amer 12 (*)    All other components within normal limits  URINE MICROSCOPIC-ADD ON - Abnormal; Notable for the following:    Squamous Epithelial / LPF 0-5 (*)    Bacteria, UA MANY (*)    All other components within normal limits  HEPARIN LEVEL (UNFRACTIONATED) - Abnormal; Notable for the following:    Heparin Unfractionated 1.09 (*)    All other  components within normal limits  GLUCOSE, CAPILLARY - Abnormal; Notable for the following:    Glucose-Capillary 114 (*)    All other components within normal limits  I-STAT CG4 LACTIC ACID, ED - Abnormal; Notable for the following:    Lactic Acid, Venous 4.57 (*)    All other components within normal limits  I-STAT VENOUS BLOOD GAS, ED - Abnormal; Notable for the following:    pCO2, Ven 36.8 (*)    pO2, Ven 25.0 (*)    Bicarbonate 16.1 (*)    Acid-base deficit 10.0 (*)    All other components within normal limits  I-STAT CHEM 8, ED - Abnormal; Notable for the following:    Sodium 129 (*)    Potassium 6.5 (*)    BUN 95 (*)    Creatinine, Ser 6.20 (*)    Glucose, Bld 101 (*)    Calcium, Ion 0.75 (*)    Hemoglobin 9.5 (*)    HCT 28.0 (*)    All other components within normal limits  MRSA PCR SCREENING  CULTURE, BLOOD (ROUTINE X 2)  CULTURE, BLOOD (ROUTINE X 2)  URINE CULTURE  BRAIN NATRIURETIC PEPTIDE  PROCALCITONIN  HEPARIN LEVEL (UNFRACTIONATED)  RENAL FUNCTION PANEL  I-STAT CG4 LACTIC ACID, ED  I-STAT CG4 LACTIC ACID, ED  I-STAT CG4 LACTIC ACID, ED    EKG  EKG Interpretation  Date/Time:  Sunday August 15 2016 17:35:42 EST Ventricular Rate:  99 PR Interval:    QRS Duration: 128 QT Interval:  401 QTC Calculation: 512 R Axis:   -79 Text Interpretation:  Sinus rhythm Biatrial enlargement Right bundle branch block Inferior infarct, old QRS widened since prior ECG, TW appear peaked in comparison to prior Confirmed by [facility] MD, Dupo ([id number]) on 08/15/2016 9:43:22 PM       Radiology Dg Lumbar Spine 2-3 Views  Result Date: 08/15/2016 CLINICAL DATA:  Fall. Low back pain. Initial encounter. Metastatic prostate carcinoma. EXAM: LUMBAR SPINE - 2-3 VIEW COMPARISON:  AP CT on 01/08/2015 FINDINGS: No evidence of acute lumbar spine fracture. Moderate to severe degenerative disc disease again seen at L4-5 and L5-S1. Bilateral facet DJD is also seen from levels of L3-S1, with  stable grade 1 degenerative anterolisthesis at L4-5 measuring 6 mm. No focal lytic or sclerotic bone lesions identified. Right ureteral stent is seen in place. IMPRESSION: No acute findings. Stable lower lumbar spondylosis with grade 1 degenerative anterolisthesis at L4-5. Electronically Signed   By: Earle Gell [name]   On: 08/15/2016 20:40   Ct Head Wo Contrast  Result Date: 08/15/2016 CLINICAL DATA:  [place] tonight. Found down. History of metastatic prostate cancer. EXAM: CT HEAD WITHOUT CONTRAST CT CERVICAL SPINE WITHOUT CONTRAST TECHNIQUE: Multidetector CT imaging of the head and cervical spine was performed following the standard protocol without intravenous contrast. Multiplanar CT image reconstructions of the cervical spine were also generated. COMPARISON:  None. FINDINGS: CT HEAD FINDINGS Brain: Large area of tumoral edema involving the left temporal and parietal regions. There appears to be a large extra-axial mass involving the left frontal bone and left sphenoid wing with tumor in the left orbit and probably extending up into the brain. There is significant mass effect on the sylvian fissure. There is mass effect on the left lateral ventricle and left-to-right shift of 5.5 mm. New line the brainstem and cerebellum are grossly normal. No downward transtentorial herniation. No acute hemorrhage. Vascular: Vascular calcifications.  No definite aneurysm. Skull: Metastatic prostate cancer involving the left frontal bone and sphenoid wing with a soft tissue component invading the orbit and brain. No pathologic fractures identified. Sinuses/Orbits: Opacified left half sphenoid sinus. The other paranasal sinuses are clear. The mastoid air cells are clear. Other: No scalp lesions. CT CERVICAL SPINE FINDINGS Alignment: Advanced degenerative cervical spondylosis with multilevel disc disease and facet disease. Lytic and sclerotic changes involving the cervical vertebral bodies consistent with metastasis. Partial  interbody fusion noted at C2-3. The spinal canal is generous. No significant spinal stenosis. Number no acute cervical spine fracture. IMPRESSION: 1. Large area of tumoral edema involving the left temporal and parietal regions likely due to extra-axial metastatic prostate cancer invading brain and left orbit. Recommend MRI brain without and with contrast for further evaluation. 2. Mass effect on the left lateral ventricle and 5.5 mm of left-to-right shift. 3. No associated hemorrhage for downward transtentorial herniation. 4. Destructive changes involving the left frontal bone and sphenoid wing 5. No acute skull or cervical spine fracture. Electronically Signed   By: Marijo Sanes [name]   On: 08/15/2016 20:42   Ct Cervical Spine Wo Contrast  Result Date: 08/15/2016 CLINICAL DATA:  [place] tonight. Found down. History of metastatic prostate cancer.  EXAM: CT HEAD WITHOUT CONTRAST CT CERVICAL SPINE WITHOUT CONTRAST TECHNIQUE: Multidetector CT imaging of the head and cervical spine was performed following the standard protocol without intravenous contrast. Multiplanar CT image reconstructions of the cervical spine were also generated. COMPARISON:  None. FINDINGS: CT HEAD FINDINGS Brain: Large area of tumoral edema involving the left temporal and parietal regions. There appears to be a large extra-axial mass involving the left frontal bone and left sphenoid wing with tumor in the left orbit and probably extending up into the brain. There is significant mass effect on the sylvian fissure. There is mass effect on the left lateral ventricle and left-to-right shift of 5.5 mm. New line the brainstem and cerebellum are grossly normal. No downward transtentorial herniation. No acute hemorrhage. Vascular: Vascular calcifications.  No definite aneurysm. Skull: Metastatic prostate cancer involving the left frontal bone and sphenoid wing with a soft tissue component invading the orbit and brain. No pathologic fractures identified.  Sinuses/Orbits: Opacified left half sphenoid sinus. The other paranasal sinuses are clear. The mastoid air cells are clear. Other: No scalp lesions. CT CERVICAL SPINE FINDINGS Alignment: Advanced degenerative cervical spondylosis with multilevel disc disease and facet disease. Lytic and sclerotic changes involving the cervical vertebral bodies consistent with metastasis. Partial interbody fusion noted at C2-3. The spinal canal is generous. No significant spinal stenosis. Number no acute cervical spine fracture. IMPRESSION: 1. Large area of tumoral edema involving the left temporal and parietal regions likely due to extra-axial metastatic prostate cancer invading brain and left orbit. Recommend MRI brain without and with contrast for further evaluation. 2. Mass effect on the left lateral ventricle and 5.5 mm of left-to-right shift. 3. No associated hemorrhage for downward transtentorial herniation. 4. Destructive changes involving the left frontal bone and sphenoid wing 5. No acute skull or cervical spine fracture. Electronically Signed   By: Marijo Sanes [name]   On: 08/15/2016 20:42   US Renal  Result Date: 08/16/2016 CLINICAL DATA:  70 year old hypertensive male with acute renal failure. Left renal mass. Subsequent encounter. EXAM: RENAL / URINARY TRACT ULTRASOUND COMPLETE COMPARISON:  10/08/2015 ultrasound.  01/08/2015 CT FINDINGS: Right Kidney: Length: 12 cm.  Moderate right hydronephrosis.  Stent in place. Left Kidney: Length: 11.8 cm. No hydronephrosis. Upper pole 3.9 x 3.6 x 3.7 cm mass worrisome for malignancy without significant change from prior CT. Adjacent 2.2 x 1.2 x 2.2 cm cyst. Bladder: Stent in place. Trace free fluid. IMPRESSION: Right-sided double-J ureteral stents is in place with moderate right-sided hydronephrosis. Left upper pole renal mass measuring up to 3.9 cm without significant change from prior CT. This has characteristics worrisome for malignancy. If further delineation is clinically  desired, dedicated renal MR (preferably) or CT may be considered. Electronically Signed   By: Genia Del [name]   On: 08/16/2016 10:49   Dg Chest Port 1 View  Result Date: 08/15/2016 CLINICAL DATA:  Fall.  Hypotension.  Initial encounter. EXAM: PORTABLE CHEST 1 VIEW COMPARISON:  07/02/2015 FINDINGS: Normal heart size. Negative mediastinal contours. Porta catheter on the right with tip at the SVC level. Gas-filled stomach; contour of the midline gas bubble may be related to patient's lymphadenopathy seen 07/04/2015. There is no edema, consolidation, effusion, or pneumothorax. No acute osseous finding. IMPRESSION: No acute finding. Electronically Signed   By: Monte Fantasia [name]   On: 08/15/2016 18:23   Dg Tibia/fibula Left Port  Result Date: 08/16/2016 CLINICAL DATA:  Altered mental status after fall. EXAM: PORTABLE LEFT TIBIA AND FIBULA - 2 VIEW  COMPARISON:  Right knee radiograph 02/05/2004 FINDINGS: There is a total knee arthroplasty. No abnormal lucency surrounding the hardware. There is marked osseous overgrowth at the posterior aspect of the tibial component. No acute fracture or dislocation of the tibia or fibula. The ankle is approximated. There is apparent lower extremity edema without subcutaneous gas. IMPRESSION: 1. No focal hardware abnormality of the right total knee arthroplasty. 2. No fracture or dislocation of the right tibia or fibula. 3. Lower extremity edema without soft tissue emphysema. Electronically Signed   By: Ulyses Jarred [name]   On: 08/16/2016 00:59    Procedures Procedures (including critical care time)  Medications Ordered in ED Medications  albuterol (PROVENTIL, VENTOLIN) (5 MG/ML) 0.5% continuous inhalation solution (not administered)  sodium bicarbonate 150 mEq in sterile water 1,000 mL infusion ( Intravenous Rate/Dose Verify 08/16/16 0800)  norepinephrine (LEVOPHED) 4 mg in dextrose 5 % 250 mL (0.016 mg/mL) infusion (6 mcg/min Intravenous Rate/Dose Change 08/16/16  0800)  piperacillin-tazobactam (ZOSYN) IVPB 2.25 g (2.25 g Intravenous Given 08/16/16 0533)  chlorhexidine (PERIDEX) 0.12 % solution 15 mL (15 mLs Mouth Rinse Given 08/16/16 0910)  MEDLINE mouth rinse (not administered)  pantoprazole (PROTONIX) EC tablet 40 mg (40 mg Oral Given 08/16/16 0910)  dexamethasone (DECADRON) injection 4 mg (4 mg Intravenous Given 08/16/16 0910)  fentaNYL (SUBLIMAZE) injection 25-100 mcg (not administered)  sodium chloride 0.9 % bolus 1,000 mL (0 mLs Intravenous Stopped 08/15/16 1757)    And  sodium chloride 0.9 % bolus 1,000 mL (0 mLs Intravenous Stopped 08/15/16 1808)    And  sodium chloride 0.9 % bolus 1,000 mL (0 mLs Intravenous Stopped 08/15/16 2037)  piperacillin-tazobactam (ZOSYN) IVPB 3.375 g (0 g Intravenous Stopped 08/15/16 2037)  vancomycin (VANCOCIN) IVPB 1000 mg/200 mL premix (0 mg Intravenous Stopped 08/15/16 2037)  sodium chloride 0.9 % bolus 1,000 mL (0 mLs Intravenous Stopped 08/15/16 2217)  sodium chloride 0.9 % bolus 1,000 mL (0 mLs Intravenous Stopped 08/15/16 2218)  dexamethasone (DECADRON) injection 10 mg (10 mg Intravenous Given 08/15/16 2123)  insulin aspart (novoLOG) injection 5 Units (5 Units Intravenous Given 08/15/16 2149)  dextrose 50 % solution 50 mL (50 mLs Intravenous Given 08/15/16 2149)  calcium gluconate 1 g in sodium chloride 0.9 % 100 mL IVPB (0 g Intravenous Stopped 08/15/16 2259)  albuterol (PROVENTIL,VENTOLIN) solution continuous neb (20 mg/hr Nebulization Given 08/15/16 2208)  sodium polystyrene (KAYEXALATE) 15 GM/60ML suspension 30 g (30 g Oral Given 08/15/16 2314)  calcium gluconate 1 g in sodium chloride 0.9 % 100 mL IVPB (1 g Intravenous Restarted 08/16/16 0015)  vancomycin (VANCOCIN) IVPB 1000 mg/200 mL premix (1,000 mg Intravenous Given 08/16/16 0224)  sodium chloride 0.9 % bolus 1,000 mL (1,000 mLs Intravenous Given 08/16/16 0211)   CRITICAL CARE: hyperkalemia, septic shock Performed by: Alvino Chapel   Total critical  care time: 45 minutes  Critical care time was exclusive of separately billable procedures and treating other patients.  Critical care was necessary to treat or prevent imminent or life-threatening deterioration.  Critical care was time spent personally by me on the following activities: development of treatment plan with patient and/or surrogate as well as nursing, discussions with consultants, evaluation of patient's response to treatment, examination of patient, obtaining history from patient or surrogate, ordering and performing treatments and interventions, ordering and review of laboratory studies, ordering and review of radiographic studies, pulse oximetry and re-evaluation of patient's condition.   Initial Impression / Assessment and Plan / ED Course  I have reviewed the triage  vital signs and the nursing notes.  Pertinent labs & imaging results that were available during my care of the patient were reviewed by me and considered in my medical decision making (see chart for details).  Clinical Course    70 year old male with a history of prostate cancer initially diagnosed in 2001, with bony metastases, brain metastases with large mass in the left sphenoid bone and extension into the left orbit receiving whole brain radiation at [facility], likely renal cell carcinoma, PE on coumadin, chronic abdominal fullness from malignancy on home dilaudid, who presents with concern for altered mental status and fall.  Patient hypotensive on arrival to the [id number] systolic.  He has had 3 days of LLE redness consistent with cellulitis, and given hypotension on arrival called Code Sepsis, ordered vancomycin, zosyn, 3L of normal saline.  Portable CXR does not show pneumonia. Urinalysis shows likely UTI.  Suspect cellulitis/UTI/sepsis and dehydration both as contributors to hypotension and lactic acidosis.  With history of fall, CT head and cervical spine ordered and showed known mets to the brain and mass effect on  lateral ventrical. Initially paged NSU, however on review of records degree of change is unchanged from MR at [facility]. Gien decadron. INR 1.45.  After receiving 3L of NS, blood pressures initially improved however again decreased and 2 more L of NS given.  Repeat lactic acid improved. Lab had canceled CMP order and there was delay in redraw. ISTAT orered and showed K 6.5. Do feel ECG changes may be secondary to this. Given insulin, dextrose, calcium, albuterol.  Consulted Neprhology who evaluated patient.   Discussed goals of care with family given his metastatic cancer, previous discussions initiated with oncology regarding possible hospice.  Family discussed with patient and decided they would like him to remain full code despite his poor prognosis.  Critical care consulted and evaluted patient and discussed with family and changed code status to DNR. Patient admitted to ICU.   Final Clinical Impressions(s) / ED Diagnoses   Final diagnoses:  Septic shock ([place])  Urinary tract infection without hematuria, site unspecified  Hyperkalemia  Metastatic cancer ([place])  Acute renal failure, unspecified acute renal failure type [facility])    New Prescriptions Current Discharge Medication List       Gareth Morgan, MD 08/16/16 1124

## 2016-08-15 NOTE — ED Notes (Signed)
Dr. Billy Fischer made aware of vitals, at bedside.

## 2016-08-15 NOTE — ED Notes (Signed)
Respiratory called

## 2016-08-15 NOTE — Progress Notes (Addendum)
Pharmacy Antibiotic/Anticoagulation Note  [name]. is a 70 y.o. male admitted on 08/15/2016 with sepsis - likely source LLE cellulitis.   AC: Pt on Lovenox PTA for h/o PE in cancer pt. Lovenox 90mg  given 11/4 1900. Hgb low but stable. To switch to heparin for now with acute renal failure.  ID: Pharmacy has been consulted for Vancomycin and Zosyn dosing. Vanc 1gm and Zosyn 3.375gm IV given ~1900 in ED.  Antimicrobials this admission: 11/5 Vanc >>  11/5 Zosyn >>   Dose adjustments this admission: n/a  Microbiology results: 11/5 BCx x2:  11/5 UCx:    Nephrology: Pt with acute renal failure. SCr up to 6.17 (baseline SCr 0.8 ~1 mos ago). Per nephrology note - poor HD candidate - supportive care for now.  Plan:  Baseline heparin level to evaluate if Lovenox cleared Zosyn 2.25 gm IV q8h Vancomycin 1000mg  IV now to complete 2gm IV loading dose - will check random level in 48-72h and redose if <20 Will f/u micro data, renal function, and pt's clinical condition Vanc level prn Famotidine 20mg  IV q24h for stress ulcer prophylaxis  Addendum (0320): Heparin level came back 1.09 (Lovenox hanging around due to acute renal failure) - pt adequately anticoagulated for now.  Plan: Will check another heparin level in ~8hrs.   Height: 5\' 7"  (170.2 cm) Weight: 197 lb (89.4 kg) IBW/kg (Calculated) : 66.1  Temp (24hrs), Avg:97.5 F (36.4 C), Min:97.3 F (36.3 C), Max:97.7 F (36.5 C)   Recent Labs Lab 08/15/16 1725 08/15/16 1744 08/15/16 2130 08/15/16 2131 08/15/16 2213  WBC 16.0*  --   --   --   --   CREATININE  --   --  6.20*  --  6.71*  LATICACIDVEN  --  4.57*  --  1.28  --     Estimated Creatinine Clearance: 10.9 mL/min (by C-G formula based on SCr of 6.71 mg/dL (H)).    No Known Allergies  Thank you for allowing pharmacy to be a part of this patient's care.  Sherlon Handing, PharmD, BCPS Clinical pharmacist, pager [phone] 08/15/2016 11:52 PM

## 2016-08-15 NOTE — H&P (Signed)
PULMONARY / CRITICAL CARE MEDICINE   Name: [name]. MRN: [id number] DOB: 1946/03/07    ADMISSION DATE:  08/15/2016 CONSULTATION DATE:  08/15/2016   REFERRING MD:  Dr. Billy Fischer EDP  CHIEF COMPLAINT:  AMS  HISTORY OF PRESENT ILLNESS:  70 year old male with past medical history as below, which is significant for obstructive sleep apnea on CPAP, PE on Coumadin, acute renal failure, and arthritis. He also has a long history of cancer initially with prostate cancer diagnosed in 2011. He has been on several escalating therapies per oncology notes from [facility]. Each of which have been stopped due to progression of disease. Most recently was found to have bone and large brain metastases and is now status post palliative whole brain radiotherapy. This is thought to be secondary to renal cell carcinoma. Last treatment 10/4 with minimal decrease in size of mass on CT in ER today. Oncology no longer recommends any type of treatment for this. He reportedly has a life expectancy of about 2 months at this point. Family was planning to initiate hospice consultation 11/6, however, 11/5 [name] fell at home in the morning. After his fall he took a nap for several hours and was much less responsive upon waking up. His family brought him to the emergency department for this reason. Of note he has had red rash on left lower extremity, which has been worsening, and treated with antifungal topical ointment.  Upon arrival to the emergency department he was hypotensive with systolic blood pressures in the 70s. He received 3 L of IV fluid in the emergency department and blood pressure has slightly improved with systolics in the 90 to [id number]. Lactic acid remains normal. On laboratory evaluation was found to have severe elevation in his serum creatinine at 6.7 and profound hyperkalemia greater than 7.5. Of note family reports very poor by mouth intake over the last few days. He was treated  with temporizing measures in the emergency department. CT of the head and C-spine was done in the setting of falls, and demonstrated persistent large tumor, with about 5.5 mm of midline shift. Chest x-ray with no acute findings. PCCM has been consulted for potential ICU admission.   PAST MEDICAL HISTORY :  He  has a past medical history of Chemotherapy-induced nausea; ED (erectile dysfunction); Fatigue; History of acute renal failure; History of DVT (deep vein thrombosis); History of pulmonary embolus (PE); Hydronephrosis, right; Hypertension; Left renal mass; Metastasis to bone (HCC); OSA on CPAP; Osteoarthritis; Port-a-cath in place; Prostate cancer [facility]) (ONCOLOGIST-  DR Alen Blew); Retroperitoneal lymphadenopathy; and Urgency of urination.  PAST SURGICAL HISTORY: He  has a past surgical history that includes Total knee arthroplasty (Bilateral, RIGHT  04-24-2009/   LEFT  2005); INSERTION IVC FILTER AND REMOVAL (03-25-2009/   REMOVAL 05-29-2009); Prostatectomy (MAY 2001); transthoracic echocardiogram (12-27-2012); Inguinal hernia repair (Right, 2010); Penile prosthesis implant (2005); Cystoscopy w/ ureteral stent placement (Right, 10/22/2013); Portacath placement (02-06-2014); Colonoscopy w/ polypectomy (05-23-2013); Cystoscopy w/ ureteral stent placement (Right, 06/24/2014); Cystoscopy w/ ureteral stent placement (Right, 11/22/2014); and Cystoscopy with ureteroscopy and stent placement (Right, 10/27/2015).  No Known Allergies  No current facility-administered medications on file prior to encounter.    Current Outpatient Prescriptions on File Prior to Encounter  Medication Sig  . calcium carbonate (OS-CAL) 1250 MG chewable tablet Chew 1 tablet by mouth 2 (two) times daily.   [name] enoxaparin (LOVENOX) 100 MG/ML injection INJECT 0.9 MLS (90 MG TOTAL) INTO THE SKIN DAILY.  [name] leuprolide (  LUPRON) 30 MG injection Inject 30 mg into the muscle every 4 (four) months.   . lidocaine-prilocaine (EMLA) cream Apply  topically as needed. (Patient taking differently: Apply 1 application topically as needed (for port). )  . loratadine (CLARITIN) 10 MG tablet Take 10 mg by mouth daily.  [name] MYRBETRIQ 50 MG TB24 tablet Take 50 mg by mouth daily.  [name] olaparib (LYNPARZA) 50 MG capsule Take 400 mg by mouth 2 (two) times daily. Swallow whole.  . ondansetron (ZOFRAN) 8 MG tablet TAKE 1 TABLET BY MOUTH EVERY 8 HOURS AS NEEDED FOR NAUSEA OR VOMITING.  . pantoprazole (PROTONIX) 40 MG tablet Take 40 mg by mouth.  [name] HYDROmorphone (DILAUDID) 4 MG tablet Take 1 tablet (4 mg total) by mouth every 4 (four) hours as needed for severe pain. (Patient not taking: Reported on 08/15/2016)    FAMILY HISTORY:  His indicated that his mother is deceased. He indicated that his father is deceased. He indicated that the status of his neg hx is unknown.    SOCIAL HISTORY: He  reports that he has never smoked. He has never used smokeless tobacco. He reports that he does not drink alcohol or use drugs.  REVIEW OF SYSTEMS:   Limited due to encephalopathic Bolds are positive  Constitutional: weight loss, gain, night sweats, Fevers, chills, fatigue .  HEENT: headaches, Sore throat, sneezing, nasal congestion, post nasal drip, Difficulty swallowing, Tooth/dental problems, visual complaints visual changes, ear ache CV:  chest pain, radiates: ,Orthopnea, PND, swelling in lower extremities, dizziness, palpitations, syncope.  GI  heartburn, indigestion, abdominal pain, nausea, vomiting, diarrhea, change in bowel habits, loss of appetite, bloody stools.  Resp: cough, productive: , hemoptysis, dyspnea, chest pain, pleuritic.  Skin: rash or itching or icterus GU: dysuria, change in color of urine, urgency or frequency. flank pain, hematuria  MS: joint pain or LLE swelling. decreased range of motion  Psych: change in mood or affect. depression or anxiety.  Neuro: difficulty with speech, weakness, numbness, ataxia.   SUBJECTIVE:    VITAL  SIGNS: BP (!) 87/61   Pulse 103   Temp 97.7 F (36.5 C)   Resp 24   Ht 5\' 7"  (1.702 m)   Wt 89.4 kg (197 lb)   SpO2 100%   BMI 30.85 kg/m   HEMODYNAMICS:    VENTILATOR SETTINGS:    INTAKE / OUTPUT: I/O last 3 completed shifts: In: 2000 [IV Piggyback:2000] Out: -   PHYSICAL EXAMINATION: General: overweight male in NAD Neuro:  Somnolent, easily arouses. HEENT:  [place]/AT, PERRL, no JVD. L eye slight protrusion Cardiovascular:  RRR, no MRG Lungs:  Clear Abdomen:  Soft, non-tender Musculoskeletal:  No acute deformity Skin:  Left lower extremity erythema and edema. Pain to touch  LABS:  BMET  Recent Labs Lab 08/15/16 2130 08/15/16 2213  NA 129* 129*  K 6.5* >7.5*  CL 104 96*  CO2  --  13*  BUN 95* 110*  CREATININE 6.20* 6.71*  GLUCOSE 101* 74    Electrolytes  Recent Labs Lab 08/15/16 2213  CALCIUM 7.2*  MG 3.1*  PHOS 8.2*    CBC  Recent Labs Lab 08/15/16 1725 08/15/16 2130  WBC 16.0*  --   HGB 8.4* 9.5*  HCT 24.3* 28.0*  PLT 273  --     Coag's  Recent Labs Lab 08/15/16 1823  INR 1.45    Sepsis Markers  Recent Labs Lab 08/15/16 1744 08/15/16 2131  LATICACIDVEN 4.57* 1.28    ABG No results for input(s):  PHART, PCO2ART, PO2ART in the last 168 hours.  Liver Enzymes  Recent Labs Lab 08/15/16 2213  AST 34  ALT 16*  ALKPHOS 88  BILITOT 1.8*  ALBUMIN 2.7*    Cardiac Enzymes No results for input(s): TROPONINI, PROBNP in the last 168 hours.  Glucose No results for input(s): GLUCAP in the last 168 hours.  Imaging Dg Lumbar Spine 2-3 Views  Result Date: 08/15/2016 CLINICAL DATA:  Fall. Low back pain. Initial encounter. Metastatic prostate carcinoma. EXAM: LUMBAR SPINE - 2-3 VIEW COMPARISON:  AP CT on 01/08/2015 FINDINGS: No evidence of acute lumbar spine fracture. Moderate to severe degenerative disc disease again seen at L4-5 and L5-S1. Bilateral facet DJD is also seen from levels of L3-S1, with stable grade 1 degenerative  anterolisthesis at L4-5 measuring 6 mm. No focal lytic or sclerotic bone lesions identified. Right ureteral stent is seen in place. IMPRESSION: No acute findings. Stable lower lumbar spondylosis with grade 1 degenerative anterolisthesis at L4-5. Electronically Signed   By: Earle Gell [name]   On: 08/15/2016 20:40   Ct Head Wo Contrast  Result Date: 08/15/2016 CLINICAL DATA:  [place] tonight. Found down. History of metastatic prostate cancer. EXAM: CT HEAD WITHOUT CONTRAST CT CERVICAL SPINE WITHOUT CONTRAST TECHNIQUE: Multidetector CT imaging of the head and cervical spine was performed following the standard protocol without intravenous contrast. Multiplanar CT image reconstructions of the cervical spine were also generated. COMPARISON:  None. FINDINGS: CT HEAD FINDINGS Brain: Large area of tumoral edema involving the left temporal and parietal regions. There appears to be a large extra-axial mass involving the left frontal bone and left sphenoid wing with tumor in the left orbit and probably extending up into the brain. There is significant mass effect on the sylvian fissure. There is mass effect on the left lateral ventricle and left-to-right shift of 5.5 mm. New line the brainstem and cerebellum are grossly normal. No downward transtentorial herniation. No acute hemorrhage. Vascular: Vascular calcifications.  No definite aneurysm. Skull: Metastatic prostate cancer involving the left frontal bone and sphenoid wing with a soft tissue component invading the orbit and brain. No pathologic fractures identified. Sinuses/Orbits: Opacified left half sphenoid sinus. The other paranasal sinuses are clear. The mastoid air cells are clear. Other: No scalp lesions. CT CERVICAL SPINE FINDINGS Alignment: Advanced degenerative cervical spondylosis with multilevel disc disease and facet disease. Lytic and sclerotic changes involving the cervical vertebral bodies consistent with metastasis. Partial interbody fusion noted at C2-3.  The spinal canal is generous. No significant spinal stenosis. Number no acute cervical spine fracture. IMPRESSION: 1. Large area of tumoral edema involving the left temporal and parietal regions likely due to extra-axial metastatic prostate cancer invading brain and left orbit. Recommend MRI brain without and with contrast for further evaluation. 2. Mass effect on the left lateral ventricle and 5.5 mm of left-to-right shift. 3. No associated hemorrhage for downward transtentorial herniation. 4. Destructive changes involving the left frontal bone and sphenoid wing 5. No acute skull or cervical spine fracture. Electronically Signed   By: Marijo Sanes [name]   On: 08/15/2016 20:42   Ct Cervical Spine Wo Contrast  Result Date: 08/15/2016 CLINICAL DATA:  [place] tonight. Found down. History of metastatic prostate cancer. EXAM: CT HEAD WITHOUT CONTRAST CT CERVICAL SPINE WITHOUT CONTRAST TECHNIQUE: Multidetector CT imaging of the head and cervical spine was performed following the standard protocol without intravenous contrast. Multiplanar CT image reconstructions of the cervical spine were also generated. COMPARISON:  None. FINDINGS: CT HEAD FINDINGS Brain:  Large area of tumoral edema involving the left temporal and parietal regions. There appears to be a large extra-axial mass involving the left frontal bone and left sphenoid wing with tumor in the left orbit and probably extending up into the brain. There is significant mass effect on the sylvian fissure. There is mass effect on the left lateral ventricle and left-to-right shift of 5.5 mm. New line the brainstem and cerebellum are grossly normal. No downward transtentorial herniation. No acute hemorrhage. Vascular: Vascular calcifications.  No definite aneurysm. Skull: Metastatic prostate cancer involving the left frontal bone and sphenoid wing with a soft tissue component invading the orbit and brain. No pathologic fractures identified. Sinuses/Orbits: Opacified left  half sphenoid sinus. The other paranasal sinuses are clear. The mastoid air cells are clear. Other: No scalp lesions. CT CERVICAL SPINE FINDINGS Alignment: Advanced degenerative cervical spondylosis with multilevel disc disease and facet disease. Lytic and sclerotic changes involving the cervical vertebral bodies consistent with metastasis. Partial interbody fusion noted at C2-3. The spinal canal is generous. No significant spinal stenosis. Number no acute cervical spine fracture. IMPRESSION: 1. Large area of tumoral edema involving the left temporal and parietal regions likely due to extra-axial metastatic prostate cancer invading brain and left orbit. Recommend MRI brain without and with contrast for further evaluation. 2. Mass effect on the left lateral ventricle and 5.5 mm of left-to-right shift. 3. No associated hemorrhage for downward transtentorial herniation. 4. Destructive changes involving the left frontal bone and sphenoid wing 5. No acute skull or cervical spine fracture. Electronically Signed   By: Marijo Sanes [name]   On: 08/15/2016 20:42   Dg Chest Port 1 View  Result Date: 08/15/2016 CLINICAL DATA:  Fall.  Hypotension.  Initial encounter. EXAM: PORTABLE CHEST 1 VIEW COMPARISON:  07/02/2015 FINDINGS: Normal heart size. Negative mediastinal contours. Porta catheter on the right with tip at the SVC level. Gas-filled stomach; contour of the midline gas bubble may be related to patient's lymphadenopathy seen 07/04/2015. There is no edema, consolidation, effusion, or pneumothorax. No acute osseous finding. IMPRESSION: No acute finding. Electronically Signed   By: Monte Fantasia [name]   On: 08/15/2016 18:23    STUDIES:  CT head 11/5 > Large area of tumoral edema involving the left temporal and parietal regions likely due to extra-axial metastatic prostate cancer invading brain and left orbit. Recommend MRI brain without and with contrast for further evaluation. Mass effect on the left lateral  ventricle and 5.5 mm of left-to-right shift.  CULTURES: Blood 11/5 > Urine 11/5 >  ANTIBIOTICS: Zosyn 11/5 > Vancomycin 11/5 >  SIGNIFICANT EVENTS:   LINES/TUBES: Port >   DISCUSSION: 70 year old male with widely metastatic suspected renal cell carcinoma and life expectancy of 2 months. Now presenting with severe sepsis secondary to LLE cellulitis and acute renal failure with severe hyperkalemia. Family wanting DNR/DNI, but ok with low dose pressors through port as if can improve with short term only. Hyperkalemia temporized in ED, will start HCO3 drip and give kayexalate.  Vanc, Zosyn for sepsis.  ASSESSMENT / PLAN:  PULMONARY A: OSA on CPAP  P:   Monitor Not CPAP candidate due to mental status at this point Supplemental O2 if needed to keep O2 sats 88-92% DNI status  CARDIOVASCULAR A:  Hypotension secondary to severe sepsis and dehydration  P:  S/p 3L IVF in ED, continue gentle hydration Telemetry monitoring Family OK with low dose pressors through port if needed, will cap levo at 18mcg MAP goal >  86mmHg DNR if arrests  RENAL A:   Hyperkalemia Acute renal failure (baseline SCr 0.8 1 month ago)  P:   Insulin, calcium, albuterol given in ED Kayexalate 30G now HCO3 infusion Nephrology consultation pending, will determine candidacy for HD >> medical management only  GASTROINTESTINAL A:   No acute issues  P:   NPO for now Renally dosed Pepcid IV for SUP  HEMATOLOGIC A:   Metastatic renal cell carcinoma with history of prostate Ca Anemia due to chronic disease Chronic anticoagulation for PE  P:  IV heparin per pharmacy Follow CBC, Coags Please involve palliative care in AM  INFECTIOUS A:   Severe sepsis secondary to LLE cellulitis  P:   ABX as above Follow cultures Xray L tib/fib to assess for air  ENDOCRINE A:   No acute issues    P:   Follow glucose on chemistry panel  NEUROLOGIC A:   Acute metabolic encephalopathy Large  metastatic brain mass s/p palliative WBRT P:   RASS goal: 0 Monitor Continue decadron for brain edema   FAMILY  - Updates: Extensive discussion with wife, daughter, sister-in-law. Discussed recent developments and known life expectancy until about the end of December. They have just recently decided to involve hospice in the outpatient setting. They're very clear that he would not want to be on life support. Given that information have discussed DO NOT RESUSCITATE/DO NOT INTUBATE with them, and they are in agreement. Would be okay with some short-term pressors through his port if necessary.  - Inter-disciplinary family meet or Palliative Care meeting due by:  11/13  APP critical care time 60 mins  Georgann Housekeeper, AGACNP-BC [facility] Pulmonology/Critical Care Pager [phone] or [phone]  08/15/2016 11:32 PM

## 2016-08-15 NOTE — ED Notes (Signed)
EDP at bedside  

## 2016-08-15 NOTE — ED Notes (Signed)
IV attempt x 1 unsuccessful. ?

## 2016-08-15 NOTE — ED Notes (Signed)
MD at bedsid.

## 2016-08-15 NOTE — ED Notes (Signed)
AGNP aware of critical lab

## 2016-08-15 NOTE — ED Notes (Addendum)
ACNP at bedside

## 2016-08-15 NOTE — ED Triage Notes (Signed)
Pt arrives from home via [facility] c/o AMS post fall.  EMS reports pt on coumadin.  EMS reports pt was heard to fall by family this am who found pt lying with head against wooden footboard.  Pt was placed back in bed and slept for several hours, found to be altered on waking.  On arrival pt lethargic, following few commands, oriented to self.  LLE red with purulent discharge. Dr. Billy Fischer made aware.

## 2016-08-15 NOTE — ED Notes (Signed)
Warm pack applied to site of IV infiltration.  Pulse intact.  Pt denies pain.

## 2016-08-16 ENCOUNTER — Inpatient Hospital Stay (HOSPITAL_COMMUNITY): Payer: Medicare Other

## 2016-08-16 ENCOUNTER — Telehealth: Payer: Self-pay | Admitting: *Deleted

## 2016-08-16 DIAGNOSIS — R6521 Severe sepsis with septic shock: Secondary | ICD-10-CM

## 2016-08-16 DIAGNOSIS — C799 Secondary malignant neoplasm of unspecified site: Secondary | ICD-10-CM

## 2016-08-16 DIAGNOSIS — E875 Hyperkalemia: Secondary | ICD-10-CM

## 2016-08-16 DIAGNOSIS — L8990 Pressure ulcer of unspecified site, unspecified stage: Secondary | ICD-10-CM | POA: Insufficient documentation

## 2016-08-16 DIAGNOSIS — G9340 Encephalopathy, unspecified: Secondary | ICD-10-CM

## 2016-08-16 DIAGNOSIS — A419 Sepsis, unspecified organism: Principal | ICD-10-CM

## 2016-08-16 DIAGNOSIS — Z66 Do not resuscitate: Secondary | ICD-10-CM

## 2016-08-16 DIAGNOSIS — N179 Acute kidney failure, unspecified: Secondary | ICD-10-CM

## 2016-08-16 DIAGNOSIS — L03116 Cellulitis of left lower limb: Secondary | ICD-10-CM

## 2016-08-16 LAB — URINALYSIS, ROUTINE W REFLEX MICROSCOPIC
BILIRUBIN URINE: NEGATIVE
GLUCOSE, UA: NEGATIVE mg/dL
KETONES UR: 15 mg/dL — AB
Nitrite: NEGATIVE
PROTEIN: 100 mg/dL — AB
Specific Gravity, Urine: 1.012 (ref 1.005–1.030)
pH: 5 (ref 5.0–8.0)

## 2016-08-16 LAB — BASIC METABOLIC PANEL
ANION GAP: 13 (ref 5–15)
ANION GAP: 15 (ref 5–15)
BUN: 84 mg/dL — AB (ref 6–20)
BUN: 89 mg/dL — AB (ref 6–20)
CHLORIDE: 97 mmol/L — AB (ref 101–111)
CHLORIDE: 103 mmol/L (ref 101–111)
CO2: 13 mmol/L — AB (ref 22–32)
CO2: 14 mmol/L — AB (ref 22–32)
Calcium: 5.6 mg/dL — CL (ref 8.9–10.3)
Calcium: 5.9 mg/dL — CL (ref 8.9–10.3)
Creatinine, Ser: 4.95 mg/dL — ABNORMAL HIGH (ref 0.61–1.24)
Creatinine, Ser: 5.02 mg/dL — ABNORMAL HIGH (ref 0.61–1.24)
GFR calc Af Amer: 12 mL/min — ABNORMAL LOW (ref >60)
GFR calc Af Amer: 12 mL/min — ABNORMAL LOW (ref >60)
GFR calc non Af Amer: 11 mL/min — ABNORMAL LOW (ref >60)
GFR, EST NON AFRICAN AMERICAN: 11 mL/min — AB (ref >60)
GLUCOSE: 486 mg/dL — AB (ref 65–99)
GLUCOSE: 187 mg/dL — AB (ref 65–99)
POTASSIUM: 5.5 mmol/L — AB (ref 3.5–5.1)
POTASSIUM: 5.8 mmol/L — AB (ref 3.5–5.1)
Sodium: 123 mmol/L — ABNORMAL LOW (ref 135–145)
Sodium: 132 mmol/L — ABNORMAL LOW (ref 135–145)

## 2016-08-16 LAB — GLUCOSE, CAPILLARY
GLUCOSE-CAPILLARY: 114 mg/dL — AB (ref 65–99)
GLUCOSE-CAPILLARY: 155 mg/dL — AB (ref 65–99)

## 2016-08-16 LAB — URINE MICROSCOPIC-ADD ON

## 2016-08-16 LAB — RENAL FUNCTION PANEL
Albumin: 2.1 g/dL — ABNORMAL LOW (ref 3.5–5.0)
Anion gap: 13 (ref 5–15)
BUN: 93 mg/dL — AB (ref 6–20)
CHLORIDE: 103 mmol/L (ref 101–111)
CO2: 16 mmol/L — AB (ref 22–32)
Calcium: 5.9 mg/dL — CL (ref 8.9–10.3)
Creatinine, Ser: 4.82 mg/dL — ABNORMAL HIGH (ref 0.61–1.24)
GFR, EST AFRICAN AMERICAN: 13 mL/min — AB (ref >60)
GFR, EST NON AFRICAN AMERICAN: 11 mL/min — AB (ref >60)
Glucose, Bld: 176 mg/dL — ABNORMAL HIGH (ref 65–99)
POTASSIUM: 5.7 mmol/L — AB (ref 3.5–5.1)
Phosphorus: 6 mg/dL — ABNORMAL HIGH (ref 2.5–4.6)
Sodium: 132 mmol/L — ABNORMAL LOW (ref 135–145)

## 2016-08-16 LAB — PROCALCITONIN: Procalcitonin: 5.68 ng/mL

## 2016-08-16 LAB — MRSA PCR SCREENING: MRSA BY PCR: NEGATIVE

## 2016-08-16 LAB — HEPARIN LEVEL (UNFRACTIONATED)
HEPARIN UNFRACTIONATED: 1.09 [IU]/mL — AB (ref 0.30–0.70)
Heparin Unfractionated: 0.99 IU/mL — ABNORMAL HIGH (ref 0.30–0.70)

## 2016-08-16 MED ORDER — CHLORHEXIDINE GLUCONATE 0.12 % MT SOLN
15.0000 mL | Freq: Two times a day (BID) | OROMUCOSAL | Status: DC
  Administered 2016-08-16 – 2016-08-17 (×4): 15 mL via OROMUCOSAL

## 2016-08-16 MED ORDER — SODIUM CHLORIDE 0.9 % IV SOLN: INTRAVENOUS | Status: DC

## 2016-08-16 MED ORDER — SODIUM CHLORIDE 0.9 % IV BOLUS (SEPSIS)
1000.0000 mL | Freq: Once | INTRAVENOUS | Status: AC
  Administered 2016-08-16: 1000 mL via INTRAVENOUS

## 2016-08-16 MED ORDER — PIPERACILLIN-TAZOBACTAM IN DEX 2-0.25 GM/50ML IV SOLN
2.2500 g | Freq: Three times a day (TID) | INTRAVENOUS | Status: DC
  Administered 2016-08-16 – 2016-08-17 (×4): 2.25 g via INTRAVENOUS
  Filled 2016-08-16 (×9): qty 50

## 2016-08-16 MED ORDER — SODIUM POLYSTYRENE SULFONATE 15 GM/60ML PO SUSP
30.0000 g | Freq: Once | ORAL | Status: AC
  Administered 2016-08-16: 30 g via ORAL
  Filled 2016-08-16: qty 120

## 2016-08-16 MED ORDER — PANTOPRAZOLE SODIUM 40 MG PO TBEC
40.0000 mg | DELAYED_RELEASE_TABLET | Freq: Every day | ORAL | Status: DC
  Administered 2016-08-16 – 2016-08-18 (×3): 40 mg via ORAL
  Filled 2016-08-16 (×2): qty 1

## 2016-08-16 MED ORDER — FENTANYL CITRATE (PF) 100 MCG/2ML IJ SOLN
25.0000 ug | INTRAMUSCULAR | Status: DC | PRN
  Administered 2016-08-16 – 2016-08-17 (×3): 25 ug via INTRAVENOUS
  Administered 2016-08-18: 100 ug via INTRAVENOUS
  Administered 2016-08-18: 25 ug via INTRAVENOUS
  Administered 2016-08-18: 100 ug via INTRAVENOUS
  Filled 2016-08-16 (×6): qty 2

## 2016-08-16 MED ORDER — SODIUM CHLORIDE 0.9 % IV SOLN
2.0000 g | Freq: Once | INTRAVENOUS | Status: AC
  Administered 2016-08-16: 2 g via INTRAVENOUS
  Filled 2016-08-16: qty 20

## 2016-08-16 MED ORDER — VANCOMYCIN HCL IN DEXTROSE 1-5 GM/200ML-% IV SOLN
1000.0000 mg | INTRAVENOUS | Status: AC
  Administered 2016-08-16: 1000 mg via INTRAVENOUS
  Filled 2016-08-16 (×2): qty 200

## 2016-08-16 MED ORDER — DEXAMETHASONE SODIUM PHOSPHATE 4 MG/ML IJ SOLN
4.0000 mg | Freq: Two times a day (BID) | INTRAMUSCULAR | Status: DC
  Administered 2016-08-16 – 2016-08-18 (×5): 4 mg via INTRAVENOUS
  Filled 2016-08-16 (×5): qty 1

## 2016-08-16 MED ORDER — ORAL CARE MOUTH RINSE
15.0000 mL | Freq: Two times a day (BID) | OROMUCOSAL | Status: DC
  Administered 2016-08-16 – 2016-08-17 (×4): 15 mL via OROMUCOSAL

## 2016-08-16 MED ORDER — FAMOTIDINE IN NACL 20-0.9 MG/50ML-% IV SOLN
20.0000 mg | INTRAVENOUS | Status: DC
  Filled 2016-08-16: qty 50

## 2016-08-16 NOTE — Plan of Care (Signed)
  Interdisciplinary Goals of Care Family Meeting   Date carried out:: 08/16/2016  Location of the meeting: Unit  Member's involved: Physician and Bedside Registered Nurse, Social Worker and Family Member or next of kin  [place] or acting medical decision maker: Wife [name]  Discussion: We discussed goals of care for [name]. [name]  His wife and daughter state that his goals are to go home on hospice.  They would like to transition him home in the next 1-2 days.  Code status: Full DNR  Disposition: Home with Hospice  Time spent for the meeting: 25 minutes  [name] 08/16/2016, 2:13 PM

## 2016-08-16 NOTE — Progress Notes (Signed)
Nurse to call back. Joaquin Bend E, RN 08/16/2016 12:34 AM

## 2016-08-16 NOTE — Telephone Encounter (Signed)
[name] with [facility] [place] calling to ask if dr Alen Blew will be the attending, after patient is d/c'd from hospital tomorrow?

## 2016-08-16 NOTE — Progress Notes (Signed)
Patient transported to  Radiology via bed  For renal and bladder ultrasound . Tolerated well .

## 2016-08-16 NOTE — Progress Notes (Signed)
Nutrition Brief Note  Chart reviewed due to low Braden score and Malnutrition Screening Tool score of 3. Discussed patient in ICU rounds today. Family is requesting Palliative / Hospice care.  No nutrition interventions warranted at this time.  Please consult as needed.   Molli Barrows, RD, LDN, [place] Pager [phone] After Hours Pager [phone]

## 2016-08-16 NOTE — Progress Notes (Addendum)
PULMONARY / CRITICAL CARE MEDICINE   Name: [name]. MRN: [id number] DOB: 1946/04/01    ADMISSION DATE:  08/15/2016 CONSULTATION DATE:  08/15/2016   REFERRING MD:  Dr. Billy Fischer EDP  CHIEF COMPLAINT:  AMS  HISTORY OF PRESENT ILLNESS:   70 yo male with fall at home, and then altered mental status.  He was found to have Lt leg cellulitis with sepsis, AKI, and hyperkalemia.  He has metastatic cancer with bone/brain lesions and Lt renal mass concerning for renal cell cancer, and treatment resistant prostate cancer.  He is followed at [facility] and last oncology visit was advised no additional therapies could be given, and was to be assessed for hospice as an outpt >> last seen by Dr. Dorthula Matas at on 07/19/16.   SUBJECTIVE:  Has mild headache.  Denies chest pain, abdominal pain.  Breathing okay.  VITAL SIGNS: BP 90/61   Pulse 94   Temp 98.6 F (37 C) (Oral)   Resp (!) 21   Ht 5\' 11"  (1.803 m)   Wt 175 lb 14.8 oz (79.8 kg)   SpO2 100%   BMI 24.54 kg/m   INTAKE / OUTPUT: I/O last 3 completed shifts: In: 2000 [IV Piggyback:2000] Out: -   PHYSICAL EXAMINATION: General: alert Neuro:  Sluggish response, but follows commands HEENT: no stridor Cardiovascular: regular, no murmur Lungs: decreased BS, no wheeze Abdomen:  Soft, non-tender Musculoskeletal: 1+ edema Skin: Lt lower leg red, warm, mild tenderness  LABS:  BMET  Recent Labs Lab 08/15/16 2130 08/15/16 2213 08/16/16 0221  NA 129* 129* 123*  K 6.5* >7.5* 5.5*  CL 104 96* 97*  CO2  --  13* 13*  BUN 95* 110* 84*  CREATININE 6.20* 6.71* 4.95*  GLUCOSE 101* 74 486*    Electrolytes  Recent Labs Lab 08/15/16 2213 08/16/16 0221  CALCIUM 7.2* 5.6*  MG 3.1*  --   PHOS 8.2*  --     CBC  Recent Labs Lab 08/15/16 1725 08/15/16 2130  WBC 16.0*  --   HGB 8.4* 9.5*  HCT 24.3* 28.0*  PLT 273  --     Coag's  Recent Labs Lab 08/15/16 1823  INR 1.45    Sepsis Markers  Recent Labs Lab  08/15/16 1744 08/15/16 2131 08/16/16 0221  LATICACIDVEN 4.57* 1.28  --   PROCALCITON  --   --  5.68    ABG No results for input(s): PHART, PCO2ART, PO2ART in the last 168 hours.  Liver Enzymes  Recent Labs Lab 08/15/16 2213  AST 34  ALT 16*  ALKPHOS 88  BILITOT 1.8*  ALBUMIN 2.7*    Cardiac Enzymes No results for input(s): TROPONINI, PROBNP in the last 168 hours.  Glucose  Recent Labs Lab 08/16/16 0121  GLUCAP 114*    Imaging Dg Lumbar Spine 2-3 Views  Result Date: 08/15/2016 CLINICAL DATA:  Fall. Low back pain. Initial encounter. Metastatic prostate carcinoma. EXAM: LUMBAR SPINE - 2-3 VIEW COMPARISON:  AP CT on 01/08/2015 FINDINGS: No evidence of acute lumbar spine fracture. Moderate to severe degenerative disc disease again seen at L4-5 and L5-S1. Bilateral facet DJD is also seen from levels of L3-S1, with stable grade 1 degenerative anterolisthesis at L4-5 measuring 6 mm. No focal lytic or sclerotic bone lesions identified. Right ureteral stent is seen in place. IMPRESSION: No acute findings. Stable lower lumbar spondylosis with grade 1 degenerative anterolisthesis at L4-5. Electronically Signed   By: Earle Gell [name]   On: 08/15/2016 20:40   Ct  Head Wo Contrast  Result Date: 08/15/2016 CLINICAL DATA:  [place] tonight. Found down. History of metastatic prostate cancer. EXAM: CT HEAD WITHOUT CONTRAST CT CERVICAL SPINE WITHOUT CONTRAST TECHNIQUE: Multidetector CT imaging of the head and cervical spine was performed following the standard protocol without intravenous contrast. Multiplanar CT image reconstructions of the cervical spine were also generated. COMPARISON:  None. FINDINGS: CT HEAD FINDINGS Brain: Large area of tumoral edema involving the left temporal and parietal regions. There appears to be a large extra-axial mass involving the left frontal bone and left sphenoid wing with tumor in the left orbit and probably extending up into the brain. There is significant mass  effect on the sylvian fissure. There is mass effect on the left lateral ventricle and left-to-right shift of 5.5 mm. New line the brainstem and cerebellum are grossly normal. No downward transtentorial herniation. No acute hemorrhage. Vascular: Vascular calcifications.  No definite aneurysm. Skull: Metastatic prostate cancer involving the left frontal bone and sphenoid wing with a soft tissue component invading the orbit and brain. No pathologic fractures identified. Sinuses/Orbits: Opacified left half sphenoid sinus. The other paranasal sinuses are clear. The mastoid air cells are clear. Other: No scalp lesions. CT CERVICAL SPINE FINDINGS Alignment: Advanced degenerative cervical spondylosis with multilevel disc disease and facet disease. Lytic and sclerotic changes involving the cervical vertebral bodies consistent with metastasis. Partial interbody fusion noted at C2-3. The spinal canal is generous. No significant spinal stenosis. Number no acute cervical spine fracture. IMPRESSION: 1. Large area of tumoral edema involving the left temporal and parietal regions likely due to extra-axial metastatic prostate cancer invading brain and left orbit. Recommend MRI brain without and with contrast for further evaluation. 2. Mass effect on the left lateral ventricle and 5.5 mm of left-to-right shift. 3. No associated hemorrhage for downward transtentorial herniation. 4. Destructive changes involving the left frontal bone and sphenoid wing 5. No acute skull or cervical spine fracture. Electronically Signed   By: Marijo Sanes [name]   On: 08/15/2016 20:42   Ct Cervical Spine Wo Contrast  Result Date: 08/15/2016 CLINICAL DATA:  [place] tonight. Found down. History of metastatic prostate cancer. EXAM: CT HEAD WITHOUT CONTRAST CT CERVICAL SPINE WITHOUT CONTRAST TECHNIQUE: Multidetector CT imaging of the head and cervical spine was performed following the standard protocol without intravenous contrast. Multiplanar CT image  reconstructions of the cervical spine were also generated. COMPARISON:  None. FINDINGS: CT HEAD FINDINGS Brain: Large area of tumoral edema involving the left temporal and parietal regions. There appears to be a large extra-axial mass involving the left frontal bone and left sphenoid wing with tumor in the left orbit and probably extending up into the brain. There is significant mass effect on the sylvian fissure. There is mass effect on the left lateral ventricle and left-to-right shift of 5.5 mm. New line the brainstem and cerebellum are grossly normal. No downward transtentorial herniation. No acute hemorrhage. Vascular: Vascular calcifications.  No definite aneurysm. Skull: Metastatic prostate cancer involving the left frontal bone and sphenoid wing with a soft tissue component invading the orbit and brain. No pathologic fractures identified. Sinuses/Orbits: Opacified left half sphenoid sinus. The other paranasal sinuses are clear. The mastoid air cells are clear. Other: No scalp lesions. CT CERVICAL SPINE FINDINGS Alignment: Advanced degenerative cervical spondylosis with multilevel disc disease and facet disease. Lytic and sclerotic changes involving the cervical vertebral bodies consistent with metastasis. Partial interbody fusion noted at C2-3. The spinal canal is generous. No significant spinal stenosis. Number no acute  cervical spine fracture. IMPRESSION: 1. Large area of tumoral edema involving the left temporal and parietal regions likely due to extra-axial metastatic prostate cancer invading brain and left orbit. Recommend MRI brain without and with contrast for further evaluation. 2. Mass effect on the left lateral ventricle and 5.5 mm of left-to-right shift. 3. No associated hemorrhage for downward transtentorial herniation. 4. Destructive changes involving the left frontal bone and sphenoid wing 5. No acute skull or cervical spine fracture. Electronically Signed   By: Marijo Sanes [name]   On:  08/15/2016 20:42   Dg Chest Port 1 View  Result Date: 08/15/2016 CLINICAL DATA:  Fall.  Hypotension.  Initial encounter. EXAM: PORTABLE CHEST 1 VIEW COMPARISON:  07/02/2015 FINDINGS: Normal heart size. Negative mediastinal contours. Porta catheter on the right with tip at the SVC level. Gas-filled stomach; contour of the midline gas bubble may be related to patient's lymphadenopathy seen 07/04/2015. There is no edema, consolidation, effusion, or pneumothorax. No acute osseous finding. IMPRESSION: No acute finding. Electronically Signed   By: Monte Fantasia [name]   On: 08/15/2016 18:23   Dg Tibia/fibula Left Port  Result Date: 08/16/2016 CLINICAL DATA:  Altered mental status after fall. 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 No abnormal lucency surrounding the hardware. There is marked osseous overgrowth at the posterior aspect of the tibial component. No acute fracture or dislocation of the tibia or fibula. The ankle is approximated. There is apparent lower extremity edema without subcutaneous gas. IMPRESSION: 1. No focal hardware abnormality of the right total knee arthroplasty. 2. No fracture or dislocation of the right tibia or fibula. 3. Lower extremity edema without soft tissue emphysema. Electronically Signed   By: Ulyses Jarred [name]   On: 08/16/2016 00:59    STUDIES:  CT head 11/5 > Large area of tumoral edema involving the left temporal and parietal regions likely due to extra-axial metastatic prostate cancer invading brain and left orbit. Recommend MRI brain without and with contrast for further evaluation. Mass effect on the left lateral ventricle and 5.5 mm of left-to-right shift.  CULTURES: Blood 11/5 > Urine 11/5 >  ANTIBIOTICS: Zosyn 11/5 > Vancomycin 11/5 >  SIGNIFICANT EVENTS: 11/05 Transfer from [place], renal consulted, DNR established 11/06 Palliative care  consulted  LINES/TUBES: Port >   DISCUSSION: 70 yo male with Lt lower leg cellulitis, sepsis, AKI, hyperkalemia.  He has metastatic cancer with Lt renal mass, presumed to be metastatic renal cell cancer.  He also has treatment resistant prostate cancer.  Not candidate for HD.  DNR established.  ASSESSMENT / PLAN:  Septic shock 2nd to Lt lower leg cellulitis. - day 2 vancomycin, zosyn - wean pressors to keep SBP > 85  AKI >> baseline creatinine 0.8 from 07/06/16. Anion gap/non anion gap metabolic acidosis. Hyperkalemia. - continue HCO3 in IV fluid for now - f/u BMET  Moderate protein calorie malnutrition. - clear liquid diet and advance as tolerated  Anemia of critical illness and chronic disease. - f/u CBC  Acute encephalopathy 2nd to sepsis, AKI, metastatic cancer. - monitor mental status - decadron for brain edema  Hx of OSA. - CPAP as able  Hx of PE. - heparin gtt for now  DVT prophylaxis - heparin gtt per pharmacy SUP - protonix Goals of care - DNR/DNI, not HD candidate.  Will ask palliative care to assess for symptom management and hospice assessment.  CC time 39 minutes.  Chesley Mires, MD [facility] Pulmonary/Critical Care 08/16/2016, 6:53 AM Pager:  [phone] After 3pm call: [phone]

## 2016-08-16 NOTE — Care Management Note (Signed)
Case Management Note  Patient Details  Name: [name]. MRN: [id number] Date of Birth: 02/12/1946  Subjective/Objective:                    Action/Plan:  PTA from home with wife.  Pt has had extended illness and family has already reached out to [facility] prior to admit - however services were never initated/ arranged.  Pt is currently on pressors for soft BP   Expected Discharge Date:                  Expected Discharge Plan:  Home w Hospice Care  In-House Referral:     Discharge planning Services  CM Consult  Post Acute Care Choice:    Choice offered to:     DME Arranged:    DME Agency:     HH Arranged:    HH Agency:     Status of Service:  In process, will continue to follow  If discussed at Long Length of Stay Meetings, dates discussed:    Additional Comments: 08/16/2016 CM received referral for home with hospice.  CM and attending spoke in depth with attending.  Wife is adamant about pt returning home - even with limited support provided by hospice agency.  Wife will care for pt completely when home.  Wife stated she would like to use [facility].  Attending reviewed plan with wife - attempt to wean pt completely off pressor and possibly transport pt home tomorrow with hospice.  CM contacted [facility] and informed Evette of pending referral.  Attending also informed wife that if pt is not able to wean off of pressor residential hospice may be required or hospital death may be imminent - CSW consulted Maryclare Labrador, RN 08/16/2016, 3:11 PM

## 2016-08-16 NOTE — Progress Notes (Signed)
[facility] Progress Note Patient Name: [name]. DOB: [birth year] MRN: [id number]   Date of Service  08/16/2016  HPI/Events of Note  Patient admitted with shock. Family is agreeable to low-dose vasopressor infusion. Currently on Levophed at 10 with mean arterial pressure 57. Patient reportedly easily arousable and oriented 3. Currently on 3 L/m via nasal cannula oxygen with saturation 100%. Status post 5 L normal saline IV fluid.   eICU Interventions  1. Bolus 1 L normal saline over 30 minutes 2. Continuing low-dose Levophed infusion      Intervention Category Intermediate Interventions: Hypotension - evaluation and management  Tera Partridge 08/16/2016, 2:09 AM

## 2016-08-16 NOTE — Progress Notes (Signed)
ANTICOAGULATION CONSULT NOTE - Follow Up Consult  Pharmacy Consult for Heparin Indication: History of DVT/PE   No Known Allergies  Patient Measurements: Height: 5\' 11"  (180.3 cm) Weight: 175 lb 14.8 oz (79.8 kg) IBW/kg (Calculated) : 75.3 Heparin Dosing Weight: 79.8 kg (actual weight)  Vital Signs: Temp: 98.1 F (36.7 C) (11/06 1538) Temp Source: Oral (11/06 1538) BP: 87/53 (11/06 1600) Pulse Rate: 88 (11/06 1500)  Labs:  Recent Labs  08/15/16 1725 08/15/16 1823  08/15/16 2130 08/15/16 2213 08/16/16 0221 08/16/16 0617 08/16/16 1112  HGB 8.4*  --   --  9.5*  --   --   --   --   HCT 24.3*  --   --  28.0*  --   --   --   --   PLT 273  --   --   --   --   --   --   --   LABPROT  --  17.8*  --   --   --   --   --   --   INR  --  1.45  --   --   --   --   --   --   HEPARINUNFRC  --   --   --   --   --  1.09*  --  0.99*  CREATININE  --   --   < > 6.20* 6.71* 4.95* 5.02*  --   < > = values in this interval not displayed.  Estimated Creatinine Clearance: 14.6 mL/min (by C-G formula based on SCr of 5.02 mg/dL (H)).  Assessment: 70 year old male on Lovenox PTA for h/o PE in cancer pt. Lovenox 90mg  given 11/4 1900. Hgb low but stable. To switch to heparin for now with acute renal failure.  Heparin level down to 0.99 but still above range despite no lovenox/heparin since 11/4.   Goal of Therapy:  Heparin level 0.3-0.7 units/ml Monitor platelets by anticoagulation protocol: Yes   Plan:  Continue to hold heparin. Recheck level in AM to determine ability to start   Sloan Leiter, PharmD, [place] Pharmacist [phone] 08/16/2016,4:13 PM

## 2016-08-16 NOTE — Progress Notes (Addendum)
[facility] Rounding Note  Subjective/Interval History:  Consult note reviewed No new events other than initiation of Levo Pt confused   Objective Vital signs in last 24 hours: Vitals:   08/16/16 0745 08/16/16 0757 08/16/16 0800 08/16/16 0815  BP: 94/65  (!) 89/58 (!) 90/58  Pulse: 95  95 96  Resp: 18  18 20   Temp:  98.4 F (36.9 C)    TempSrc:  Oral    SpO2: 100%  100% 100%  Weight:      Height:       Weight change:   Intake/Output Summary (Last 24 hours) at 08/16/16 0838 Last data filed at 08/16/16 0800  Gross per 24 hour  Intake          7897.88 ml  Output              350 ml  Net          7547.88 ml   Physical Exam:  Blood pressure (!) 90/58, pulse 96, temperature 98.4 F (36.9 C), temperature source Oral, resp. rate 20, height 5\' 11"  (1.803 m), weight 79.8 kg (175 lb 14.8 oz), SpO2 100 %.   Exam Gen very frail, weak and confused, awake No rash, cyanosis or gangrene but chronic skin changes legs R sided portacath in place Sclera anicteric No jvd or bruits Chest clear bilat RRR soft SEM no RG Abd distended, protuberant, diffusely tender  GU normal male w condom cath in place, some urine Ext 2+ diffuse bilat edema below the knees LLE erythematous and swollen/ tender, couple of ulcerations on the back of the LLE Dried blood left foot Neuro awake, confused  Labs:   Recent Labs Lab 08/15/16 2130 08/15/16 2213 08/16/16 0221 08/16/16 0617  NA 129* 129* 123* 132*  K 6.5* >7.5* 5.5* 5.8*  CL 104 96* 97* 103  CO2  --  13* 13* 14*  GLUCOSE 101* 74 486* 187*  BUN 95* 110* 84* 89*  CREATININE 6.20* 6.71* 4.95* 5.02*  CALCIUM  --  7.2* 5.6* 5.9*  PHOS  --  8.2*  --   --      Recent Labs Lab 08/15/16 2213  AST 34  ALT 16*  ALKPHOS 88  BILITOT 1.8*  PROT 6.1*  ALBUMIN 2.7*    Recent Labs Lab 08/15/16 1725 08/15/16 2130  WBC 16.0*  --   NEUTROABS 13.9*  --   HGB 8.4* 9.5*  HCT 24.3* 28.0*  MCV 82.1  --   PLT 273  --     Recent Labs Lab  08/16/16 0121  GLUCAP 114*    Studies/Results: Dg Lumbar Spine 2-3 Views  Result Date: 08/15/2016 CLINICAL DATA:  Fall. Low back pain. Initial encounter. Metastatic prostate carcinoma. EXAM: LUMBAR SPINE - 2-3 VIEW COMPARISON:  AP CT on 01/08/2015 FINDINGS: No evidence of acute lumbar spine fracture. Moderate to severe degenerative disc disease again seen at L4-5 and L5-S1. Bilateral facet DJD is also seen from levels of L3-S1, with stable grade 1 degenerative anterolisthesis at L4-5 measuring 6 mm. No focal lytic or sclerotic bone lesions identified. Right ureteral stent is seen in place. IMPRESSION: No acute findings. Stable lower lumbar spondylosis with grade 1 degenerative anterolisthesis at L4-5. Electronically Signed   By: Earle Gell [name]   On: 08/15/2016 20:40   Ct Head Wo Contrast  Result Date: 08/15/2016 CLINICAL DATA:  [place] tonight. Found down. History of metastatic prostate cancer. EXAM: CT HEAD WITHOUT CONTRAST CT CERVICAL SPINE WITHOUT CONTRAST TECHNIQUE: Multidetector CT imaging  of the head and cervical spine was performed following the standard protocol without intravenous contrast. Multiplanar CT image reconstructions of the cervical spine were also generated. COMPARISON:  None. FINDINGS: CT HEAD FINDINGS Brain: Large area of tumoral edema involving the left temporal and parietal regions. There appears to be a large extra-axial mass involving the left frontal bone and left sphenoid wing with tumor in the left orbit and probably extending up into the brain. There is significant mass effect on the sylvian fissure. There is mass effect on the left lateral ventricle and left-to-right shift of 5.5 mm. New line the brainstem and cerebellum are grossly normal. No downward transtentorial herniation. No acute hemorrhage. Vascular: Vascular calcifications.  No definite aneurysm. Skull: Metastatic prostate cancer involving the left frontal bone and sphenoid wing with a soft tissue component  invading the orbit and brain. No pathologic fractures identified. Sinuses/Orbits: Opacified left half sphenoid sinus. The other paranasal sinuses are clear. The mastoid air cells are clear. Other: No scalp lesions. CT CERVICAL SPINE FINDINGS Alignment: Advanced degenerative cervical spondylosis with multilevel disc disease and facet disease. Lytic and sclerotic changes involving the cervical vertebral bodies consistent with metastasis. Partial interbody fusion noted at C2-3. The spinal canal is generous. No significant spinal stenosis. Number no acute cervical spine fracture. IMPRESSION: 1. Large area of tumoral edema involving the left temporal and parietal regions likely due to extra-axial metastatic prostate cancer invading brain and left orbit. Recommend MRI brain without and with contrast for further evaluation. 2. Mass effect on the left lateral ventricle and 5.5 mm of left-to-right shift. 3. No associated hemorrhage for downward transtentorial herniation. 4. Destructive changes involving the left frontal bone and sphenoid wing 5. No acute skull or cervical spine fracture. Electronically Signed   By: Marijo Sanes [name]   On: 08/15/2016 20:42   Ct Cervical Spine Wo Contrast  Result Date: 08/15/2016 CLINICAL DATA:  [place] tonight. Found down. History of metastatic prostate cancer. EXAM: CT HEAD WITHOUT CONTRAST CT CERVICAL SPINE WITHOUT CONTRAST TECHNIQUE: Multidetector CT imaging of the head and cervical spine was performed following the standard protocol without intravenous contrast. Multiplanar CT image reconstructions of the cervical spine were also generated. COMPARISON:  None. FINDINGS: CT HEAD FINDINGS Brain: Large area of tumoral edema involving the left temporal and parietal regions. There appears to be a large extra-axial mass involving the left frontal bone and left sphenoid wing with tumor in the left orbit and probably extending up into the brain. There is significant mass effect on the sylvian  fissure. There is mass effect on the left lateral ventricle and left-to-right shift of 5.5 mm. New line the brainstem and cerebellum are grossly normal. No downward transtentorial herniation. No acute hemorrhage. Vascular: Vascular calcifications.  No definite aneurysm. Skull: Metastatic prostate cancer involving the left frontal bone and sphenoid wing with a soft tissue component invading the orbit and brain. No pathologic fractures identified. Sinuses/Orbits: Opacified left half sphenoid sinus. The other paranasal sinuses are clear. The mastoid air cells are clear. Other: No scalp lesions. CT CERVICAL SPINE FINDINGS Alignment: Advanced degenerative cervical spondylosis with multilevel disc disease and facet disease. Lytic and sclerotic changes involving the cervical vertebral bodies consistent with metastasis. Partial interbody fusion noted at C2-3. The spinal canal is generous. No significant spinal stenosis. Number no acute cervical spine fracture. IMPRESSION: 1. Large area of tumoral edema involving the left temporal and parietal regions likely due to extra-axial metastatic prostate cancer invading brain and left orbit. Recommend MRI brain without  and with contrast for further evaluation. 2. Mass effect on the left lateral ventricle and 5.5 mm of left-to-right shift. 3. No associated hemorrhage for downward transtentorial herniation. 4. Destructive changes involving the left frontal bone and sphenoid wing 5. No acute skull or cervical spine fracture. Electronically Signed   By: Marijo Sanes [name]   On: 08/15/2016 20:42   Dg Chest Port 1 View  Result Date: 08/15/2016 CLINICAL DATA:  Fall.  Hypotension.  Initial encounter. EXAM: PORTABLE CHEST 1 VIEW COMPARISON:  07/02/2015 FINDINGS: Normal heart size. Negative mediastinal contours. Porta catheter on the right with tip at the SVC level. Gas-filled stomach; contour of the midline gas bubble may be related to patient's lymphadenopathy seen 07/04/2015. There is  no edema, consolidation, effusion, or pneumothorax. No acute osseous finding. IMPRESSION: No acute finding. Electronically Signed   By: Monte Fantasia [name]   On: 08/15/2016 18:23   Dg Tibia/fibula Left Port  Result Date: 08/16/2016 CLINICAL DATA:  Altered mental status after fall. 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 No abnormal lucency surrounding the hardware. There is marked osseous overgrowth at the posterior aspect of the tibial component. No acute fracture or dislocation of the tibia or fibula. The ankle is approximated. There is apparent lower extremity edema without subcutaneous gas. IMPRESSION: 1. No focal hardware abnormality of the right total knee arthroplasty. 2. No fracture or dislocation of the right tibia or fibula. 3. Lower extremity edema without soft tissue emphysema. Electronically Signed   By: Ulyses Jarred [name]   On: 08/16/2016 00:59   Medications: . norepinephrine (LEVOPHED) Adult infusion 6 mcg/min (08/16/16 0800)  .  sodium bicarbonate 150 mEq in sterile water 1000 mL infusion 75 mL/hr at 08/16/16 0800   . albuterol      . chlorhexidine  15 mL Mouth Rinse BID  . dexamethasone  4 mg Intravenous Q12H  . mouth rinse  15 mL Mouth Rinse q12n4p  . pantoprazole  40 mg Oral Daily  . piperacillin-tazobactam (ZOSYN)  IV  2.25 g Intravenous Q8H   Background: 70 y.o. year-old with history of HTN and metastatic  prostate cancer.  Presented to ED after fall at home, w/AMS, hypotension (60's), AKI, K>7.5, EKG new RBBB.  Rec'd IV Ca, ins/ glu, nebs, kayexalate,  IV fluids.  Baseline creat was 0.8 when here in Sept (07/06/16). Condom cath placed and making some urine  Assessment: 1. Acute renal failure - making small amounts of urine, clearing some. No change in BUN/creatinine. Probable ATN. Not  HD candidate, recommend supportive care, Ultrasound just to be certain not obstructed.  Prognosis poor.  2. Metabolic acidosis - gap 15. Bicarb 14. Continue isotonic sodium bicarbonate. 3. Hyperkalemia - s/p 2 doses kayexalate. Last K 5.8 drawn before 2nd dose in effect. F/U later today.  4. Shock/ hypotension - now on levophed 5. Metastatic prostate cancer - failed mult regimens, dx'd in 2001 6. LLE cellulitis - per primary. Vanco/zosyn. 7. Hx PE - on SQ lovenox at home  8. DNR   [name] Maes, MD [facility] [phone] pager 08/16/2016, 8:38 AM   Addendum: Dr. Lake Bells has informed me that after d/w family, patient will be transferred to full palliative care and moved out of ICU. No plans for HD. Will sign off. Please call if we can help further.   [name] Maes, MD [facility] [phone] Pager 08/16/2016, 12:15 PM

## 2016-08-16 NOTE — Progress Notes (Signed)
Palliative:  I have spoken with Mrs. Tiffany Kocher to potentially set up palliative meeting for support but she has already spoken with Dr. Lake Bells and is clear about her goal to get him home with hospice even acknowledging that his prognosis may only be a couple days. I do worry that he could decline prior to making it home. She is mostly concerned with equipment and care at home - hospice referral already placed and I updated hospice liaison per our conversation. She is planning on meeting with the wife when she arrives back at ~4:15 pm. Otherwise palliative will shadow for any further need to engage. Please call if we can assist further.   No Charge  Vinie Sill, NP Palliative Medicine Team Pager # [phone] (M-F 8a-5p) Team Phone # [phone] (Nights/Weekends)

## 2016-08-16 NOTE — Progress Notes (Signed)
[facility]  Re-round   Still on levophed Complains of back pain  On exam Ulcer left foot Bilateral edema ankles/legs Belly soft, nontender CV: RRR, no mgr  Septic shock> patient and family requesting palliative care/comfort measures so will not give more IVF, wean off levophed Cellulitis> continue antibiotics for now DVT> continue to monitor lovenox levels, hold heparin AKI> appreciate renal, continue bicarb gtt  Family updated, they request palliative consult, we have contacted them, will also place care management consult for hospice  DNR  Additional CC time 20 minutes  Roselie Awkward, MD [facility] Pager: [phone] Cell: [phone] After 3pm or if no response, call [phone]

## 2016-08-16 NOTE — Progress Notes (Addendum)
[facility] RN Liaison Visit - New Referral - [facility] [phone]   Met with patient's wife and sister-in-law at bedside.  Wife verbalizes understanding that patient's prognosis is short.  She wishes to take patient home and patient opens eyes during conversation to confirm that these are his wishes as well.  Education provided regarding hospice services, EOL symptoms, and care at home.  Educated regarding medication regimen and that IVFs and IV antibiotics would not continue in home.  Patient will be on PO/SL regimen to manage symptoms for comfort.   Family working to rearrange furniture in the home tonight so that DME can be delivered tomorrow.  Wife wishes for patient to have a hospital bed, double bed rails, walker, bedside commode, shower bench, and C-pap.  Discussed ordering oxygen for comfort as well.  Wife states that patient has a C-pap in the home from [place] that is no longer functional.  Educated regarding functional decline and the dangers of getting patient OOB with fluctuating BP. When discussing DME and patient's overall functional decline, wife becomes tearful.       Wife states that she would like patient to stay in the hospital until Wednesday because this will give her time to get things ready at home.  Wife is aware that hospice does not have control over discharge date; however, will be available and ready to support at time of discharge.  Provided wife with contact information for [facility].  Liaison will follow-up tomorrow.  Please feel free to call with any questions/needs. Thank you, Moss Mc, RN , BSN, [facility] Director of [place] liaison daily listing is now on AMION.  Please feel free to contact our liaisons directly or to call our main agency number at [phone].

## 2016-08-16 NOTE — Progress Notes (Signed)
Attempted report. Will call back in 5 mins. Joaquin Bend E, RN 08/16/2016 12:29 AM

## 2016-08-16 NOTE — Progress Notes (Signed)
[place] Progress Note Patient Name: [name]. DOB: Oct 03, 1946 MRN: [id number]   Date of Service  08/16/2016  HPI/Events of Note  1. K+ = 5.7 and Ca++ = 5.9.  eICU Interventions  Will order: 1. Kayexalate 30 gm PO now. 2. Calcium gluconate 2 gm IV now.  3. BMP at 12 midnight.     Intervention Category Major Interventions: Electrolyte abnormality - evaluation and management  [name] 08/16/2016, 5:53 PM

## 2016-08-16 NOTE — Progress Notes (Signed)
Ca 5.9 called to [facility] RN for Dr Oletta Darter

## 2016-08-17 ENCOUNTER — Other Ambulatory Visit: Payer: Medicare Other

## 2016-08-17 DIAGNOSIS — Z515 Encounter for palliative care: Secondary | ICD-10-CM

## 2016-08-17 DIAGNOSIS — L89004 Pressure ulcer of unspecified elbow, stage 4: Secondary | ICD-10-CM

## 2016-08-17 LAB — BASIC METABOLIC PANEL
Anion gap: 14 (ref 5–15)
Anion gap: 14 (ref 5–15)
BUN: 94 mg/dL — AB (ref 6–20)
BUN: 93 mg/dL — ABNORMAL HIGH (ref 6–20)
CALCIUM: 6.5 mg/dL — AB (ref 8.9–10.3)
CHLORIDE: 103 mmol/L (ref 101–111)
CO2: 15 mmol/L — AB (ref 22–32)
CO2: 15 mmol/L — AB (ref 22–32)
CREATININE: 4.56 mg/dL — AB (ref 0.61–1.24)
Calcium: 6.2 mg/dL — CL (ref 8.9–10.3)
Chloride: 103 mmol/L (ref 101–111)
Creatinine, Ser: 4.63 mg/dL — ABNORMAL HIGH (ref 0.61–1.24)
GFR calc Af Amer: 13 mL/min — ABNORMAL LOW (ref >60)
GFR calc Af Amer: 14 mL/min — ABNORMAL LOW (ref >60)
GFR calc non Af Amer: 12 mL/min — ABNORMAL LOW (ref >60)
GFR, EST NON AFRICAN AMERICAN: 12 mL/min — AB (ref >60)
GLUCOSE: 165 mg/dL — AB (ref 65–99)
GLUCOSE: 149 mg/dL — AB (ref 65–99)
POTASSIUM: 5.4 mmol/L — AB (ref 3.5–5.1)
Potassium: 5 mmol/L (ref 3.5–5.1)
Sodium: 132 mmol/L — ABNORMAL LOW (ref 135–145)
Sodium: 132 mmol/L — ABNORMAL LOW (ref 135–145)

## 2016-08-17 LAB — URINE CULTURE

## 2016-08-17 LAB — CBC
HCT: 24.8 % — ABNORMAL LOW (ref 39.0–52.0)
HEMOGLOBIN: 8.6 g/dL — AB (ref 13.0–17.0)
MCH: 28.2 pg (ref 26.0–34.0)
MCHC: 34.7 g/dL (ref 30.0–36.0)
MCV: 81.3 fL (ref 78.0–100.0)
PLATELETS: 208 10*3/uL (ref 150–400)
RBC: 3.05 MIL/uL — ABNORMAL LOW (ref 4.22–5.81)
RDW: 17.2 % — ABNORMAL HIGH (ref 11.5–15.5)
WBC: 16 10*3/uL — ABNORMAL HIGH (ref 4.0–10.5)

## 2016-08-17 LAB — GLUCOSE, CAPILLARY
GLUCOSE-CAPILLARY: 171 mg/dL — AB (ref 65–99)
Glucose-Capillary: 96 mg/dL (ref 65–99)

## 2016-08-17 LAB — HEPARIN LEVEL (UNFRACTIONATED): HEPARIN UNFRACTIONATED: 0.69 [IU]/mL (ref 0.30–0.70)

## 2016-08-17 MED ORDER — HEPARIN (PORCINE) IN NACL 100-0.45 UNIT/ML-% IJ SOLN
1000.0000 [IU]/h | INTRAMUSCULAR | Status: DC
  Administered 2016-08-17: 1000 [IU]/h via INTRAVENOUS
  Filled 2016-08-17 (×2): qty 250

## 2016-08-17 MED ORDER — MIDODRINE HCL 5 MG PO TABS
10.0000 mg | ORAL_TABLET | Freq: Three times a day (TID) | ORAL | Status: DC
  Administered 2016-08-17 – 2016-08-18 (×3): 10 mg via ORAL
  Filled 2016-08-17 (×5): qty 2

## 2016-08-17 MED ORDER — SODIUM CHLORIDE 0.9 % IV SOLN
1.0000 g | Freq: Once | INTRAVENOUS | Status: AC
  Administered 2016-08-17: 1 g via INTRAVENOUS
  Filled 2016-08-17: qty 10

## 2016-08-17 NOTE — Progress Notes (Signed)
ANTICOAGULATION CONSULT NOTE - Follow Up Consult  Pharmacy Consult for Heparin Indication: History of DVT/PE   No Known Allergies  Patient Measurements: Height: 5\' 11"  (180.3 cm) Weight: 181 lb 7 oz (82.3 kg) IBW/kg (Calculated) : 75.3 Heparin Dosing Weight: 79.8 kg (actual weight)  Vital Signs: Temp: 98.4 F (36.9 C) (11/07 0405) Temp Source: Oral (11/07 0405) BP: 89/52 (11/07 0700) Pulse Rate: 85 (11/07 0700)  Labs:  Recent Labs  08/15/16 1725 08/15/16 1823 08/15/16 2130  08/16/16 0221  08/16/16 1112 08/16/16 1601 08/16/16 2324 08/17/16 0434  HGB 8.4*  --  9.5*  --   --   --   --   --   --  8.6*  HCT 24.3*  --  28.0*  --   --   --   --   --   --  24.8*  PLT 273  --   --   --   --   --   --   --   --  208  LABPROT  --  17.8*  --   --   --   --   --   --   --   --   INR  --  1.45  --   --   --   --   --   --   --   --   HEPARINUNFRC  --   --   --   --  1.09*  --  0.99*  --   --  0.69  CREATININE  --   --  6.20*  < > 4.95*  < >  --  4.82* 4.63* 4.56*  < > = values in this interval not displayed.  Estimated Creatinine Clearance: 16.1 mL/min (by C-G formula based on SCr of 4.56 mg/dL (H)).  Assessment: 70 year old male on Lovenox PTA for h/o PE in cancer pt. Lovenox 90mg  given 11/4 1900. Hgb low but stable. To switch to heparin for now with acute renal failure.  Heparin level down to 0.99 but still above range despite no lovenox/heparin since 11/4.   This morning's heparin level is now therapeutic at 0.69 after holding heparin and no lovenox since 10/4. Will start heparin conservatively this morning to maintain therapeutic level.  Goal of Therapy:  Heparin level 0.3-0.7 units/ml Monitor platelets by anticoagulation protocol: Yes   Plan:  Start heparin 1000 units/hr 8h HL Daily HL/CBC  Andrey Cota. Diona Foley, PharmD, BCPS Clinical Pharmacist Pager [phone] 08/17/2016,7:54 AM

## 2016-08-17 NOTE — Progress Notes (Signed)
[facility] Liaison Visit - New Referral - [facility] 2M12  Continue to follow for home hospice arrangements. DME has been ordered and scheduled to be delivered today between 2 - 6PM. Spouse is at home waiting for delivery. Spoke with spouse by phone and met with daughter at bedside to confirm desire for patient return home tomorrow 08/18/16. Confirmed plan with RNCM Samantha.   Please help patient discharge tomorrow morning in time for [facility] RN to see in home at 1PM.   Please send out of facility DNR with patient. Please send scripts for comfort medications with patient upon discharge.  Please fax discharge summary to [facility] at [phone]. Please call [facility] referral center at time of discharge from unit [phone].  Please do not hesitate to call with questions or concerns [phone].  Thank you,  Erling Conte, LCSW [phone]

## 2016-08-17 NOTE — Progress Notes (Signed)
[place] Progress Note Patient Name: [name]. DOB: 03-22-1946 MRN: [id number]   Date of Service  08/17/2016  HPI/Events of Note  Notified by bedside nurse. Calcium 6.2. Albumin 2.1. Corrected calcium approximately 7.7. Potassium 5.4 down from 5.7. Previously did receive Kayexalate. Patient has 8 recorded bowel movements over the last 24 hours. Did previously receive 1 ampule of calcium gluconate IV .  eICU Interventions  1. Calcium gluconate 1 ampule IV.  2. Continuing telemetry monitoring      Intervention Category Intermediate Interventions: Electrolyte abnormality - evaluation and management  Tera Partridge 08/17/2016, 12:42 AM

## 2016-08-17 NOTE — Progress Notes (Signed)
Responded to consult. Pt was in bed, opened eyes, noticed my presence, drowsed back to sleep.Offered spiritual/emotional support to daughter in rm, and prayer for pt and all family members, some now at home, preparing to support him as he is soon to be released to go home with hospice support. Chaplain available for follow-up.   08/17/16 1300  Clinical Encounter Type  Visited With Patient and family together  Visit Type Initial;Psychological support;Spiritual support;Social support;Other (Comment)  Referral From (preparation for hospice)  Spiritual Encounters  Spiritual Needs Prayer;Emotional;Grief support  Stress Factors  Patient Stress Factors Health changes;Loss;Loss of control;Major life changes  Family Stress Factors Exhausted;Family relationships;Health changes;Loss

## 2016-08-17 NOTE — Telephone Encounter (Signed)
Spoke with amber at [facility], dr Alen Blew will be the attending for a hospice referral

## 2016-08-17 NOTE — Consult Note (Signed)
Consultation Note Date: 08/17/2016   Patient Name: [name].  DOB: 1945-12-16  MRN: [id number]  Age / Sex: 70 y.o., male  PCP: Gaynelle Arabian, MD Referring Physician: Juanito Doom, MD  Reason for Consultation: Establishing goals of care and Psychosocial/spiritual support  HPI/Patient Profile: 70 y.o. male  with past medical history of metastatic cancer with bone/brain lesions and Lt renal mass concerning for renal cell cancer, and treatment resistant prostate cancer admitted on 08/15/2016 with AMS r/t left lower leg cellulitis, sepsis, AKI, hyperkalemia. Planning home with hospice.   Clinical Assessment and Goals of Care: I met today at [name]'s bedside along with him and his daughter. We discussed plans and desire for home with hospice. Daughter somewhat anxious and we spent time discussing hospice support, end of life, and what to expect. Tried to provide some reassurance and psychosocial support. [name] tells [place] that he is not worried or scared of anything but just wants to get home. Family wants to support his wishes. Daughter expresses that he has always worried about everyone else and she just wants him to be happy and not be worried but to focus on him. Daughter also worried about her mother. We spent time discussing bereavement services with hospice. Emotional support and therapeutic listening provided.   Primary Decision Maker HCPOA wife - waiting at home for equipment to be delivered    SUMMARY OF RECOMMENDATIONS   - Full comfort care - Home with hospice  Code Status/Advance Care Planning:  DNR   Symptom Management:   Pain/dyspnea: Recommend to go home with Oxyfast 5 mg every 2 hours prn. Also recommend acetaminophen prn for mild pain.   Palliative Prophylaxis:   Bowel Regimen, Delirium Protocol, Frequent Pain Assessment and Turn Reposition  Additional  Recommendations (Limitations, Scope, Preferences):  Full Comfort Care  Psycho-social/Spiritual:   Desire for further Chaplaincy support:yes  Additional Recommendations: Caregiving  Support/Resources, Education on Hospice and Grief/Bereavement Support  Prognosis:   < 2 weeks  Discharge Planning: Home with Hospice      Primary Diagnoses: Present on Admission: . Severe sepsis ([place])   I have reviewed the medical record, interviewed the patient and family, and examined the patient. The following aspects are pertinent.  Past Medical History:  Diagnosis Date  . Chemotherapy-induced nausea   . ED (erectile dysfunction)   . Fatigue   . History of acute renal failure    W/ SEPSIS  MARCH 2014  . History of DVT (deep vein thrombosis)    dec 2009  . History of pulmonary embolus (PE)    dec 2009  . Hydronephrosis, right   . Hypertension   . Left renal mass    MONITORED BY UROLOGIST-  DR Diona Fanti  . Metastasis to bone [facility])    PRIMARY PROSTATE CANCER- remains with oral Chemotherapy at present  . OSA on CPAP   . Osteoarthritis   . Port-a-cath in place    right chest-"Power port"  . Prostate cancer [facility]) ONCOLOGIST-  DR Alen Blew  DX 2001 (T3 N1)-- S/P PROSTATECTOMY--/ BIOCHEMICAL RELAPSE, RECEIVED PROSTATIC BED RADIATION AND LUPRON WITH CASODEX   SINCE DEVELOPED  CASTATION RESISITENT METASTATIC PROSTATE ADENOCARINOMA--  CURRENT THERAPY CHEMOTHEAPY EVERY 3 WEEKS AND LUPRON INJECTIONS EVERY 4 MONTHS  . Retroperitoneal lymphadenopathy   . Urgency of urination    Social History   Social History  . Marital status: Married    Spouse name: N/A  . Number of children: N/A  . Years of education: N/A   Social History Main Topics  . Smoking status: Never Smoker  . Smokeless tobacco: Never Used  . Alcohol use No     Comment: none now.  . Drug use: No  . Sexual activity: Not Asked   Other Topics Concern  . None   Social History Narrative  . None   Family History  Problem  Relation Age of Onset  . Heart disease Mother   . Colon cancer Neg Hx    Scheduled Meds: . chlorhexidine  15 mL Mouth Rinse BID  . dexamethasone  4 mg Intravenous Q12H  . mouth rinse  15 mL Mouth Rinse q12n4p  . midodrine  10 mg Oral TID WC  . pantoprazole  40 mg Oral Daily   Continuous Infusions: . sodium chloride 10 mL/hr at 08/17/16 0600   PRN Meds:.fentaNYL (SUBLIMAZE) injection No Known Allergies Review of Systems  Constitutional: Positive for activity change, appetite change and fatigue.  Neurological: Positive for weakness.    Physical Exam  Constitutional: He is oriented to person, place, and time. He appears well-developed.  HENT:  Head: Normocephalic and atraumatic.  Cardiovascular: Normal rate.   Pulmonary/Chest: Effort normal. No accessory muscle usage. No tachypnea. No respiratory distress.  Abdominal: Normal appearance.  Neurological: He is alert and oriented to person, place, and time.  Sleepy  Nursing note and vitals reviewed.   Vital Signs: BP (!) 87/58   Pulse 83   Temp 97.5 F (36.4 C) (Oral)   Resp (!) 22   Ht '5\' 11"'$  (1.803 m)   Wt 82.3 kg (181 lb 7 oz)   SpO2 100%   BMI 25.31 kg/m  Pain Assessment: No/denies pain   Pain Score: Asleep   SpO2: SpO2: 100 % O2 Device:SpO2: 100 % O2 Flow Rate: .O2 Flow Rate (L/min): 5 L/min  IO: Intake/output summary:  Intake/Output Summary (Last 24 hours) at 08/17/16 1645 Last data filed at 08/17/16 1500  Gross per 24 hour  Intake          1251.78 ml  Output              970 ml  Net           281.78 ml    LBM: Last BM Date: 08/16/16 Baseline Weight: Weight: 89.4 kg (197 lb) Most recent weight: Weight: 82.3 kg (181 lb 7 oz)     Palliative Assessment/Data: PPS: 20%     Time In: 1200 Time Out: 1240 Time Total: 36mn Greater than 50%  of this time was spent counseling and coordinating care related to the above assessment and plan.  Signed by: AVinie Sill NP Palliative Medicine Team Pager #  [phone](M-F 8a-5p) Team Phone # [phone](Nights/Weekends)

## 2016-08-17 NOTE — Telephone Encounter (Signed)
That will be fine by me.

## 2016-08-17 NOTE — Progress Notes (Addendum)
PULMONARY / CRITICAL CARE MEDICINE   Name: [name]. MRN: [id number] DOB: Feb 18, 1946    ADMISSION DATE:  08/15/2016 CONSULTATION DATE:  08/15/2016   REFERRING MD:  Dr. Billy Fischer EDP  CHIEF COMPLAINT:  AMS  Brief: 70 yo male with fall at home, and then altered mental status.  He was found to have Lt leg cellulitis with sepsis, AKI, and hyperkalemia.  He has metastatic cancer with bone/brain lesions and Lt renal mass concerning for renal cell cancer, and treatment resistant prostate cancer.  He is followed at [facility] and last oncology visit was advised no additional therapies could be given, and was to be assessed for hospice as an outpt >> last seen by Dr. Dorthula Matas at on 07/19/16.   SUBJECTIVE:  Has mild headache.  Denies chest pain, abdominal pain.  Breathing okay.  VITAL SIGNS: BP (!) 88/54 (BP Location: Right Arm)   Pulse 81   Temp 98.6 F (37 C) (Oral)   Resp 18   Ht 5\' 11"  (1.803 m)   Wt 181 lb 7 oz (82.3 kg)   SpO2 100%   BMI 25.31 kg/m   INTAKE / OUTPUT: I/O last 3 completed shifts: In: 7178.3 [P.O.:120; I.V.:5418.3; Other:1030; IV Piggyback:610] Out: 1670 [Urine:1045; Stool:625]  PHYSICAL EXAMINATION: General: critically ill male, alert Neuro:  Sluggish response, but follows commands HEENT: no stridor Cardiovascular: regular, no murmur Lungs: diminished breath sounds bilaterally  Abdomen:  Soft, non-tender Musculoskeletal: 1+ edema Skin: Lt lower leg red, warm, mild tenderness  LABS:  BMET  Recent Labs Lab 08/16/16 1601 08/16/16 2324 08/17/16 0434  NA 132* 132* 132*  K 5.7* 5.4* 5.0  CL 103 103 103  CO2 16* 15* 15*  BUN 93* 94* 93*  CREATININE 4.82* 4.63* 4.56*  GLUCOSE 176* 165* 149*    Electrolytes  Recent Labs Lab 08/15/16 2213  08/16/16 1601 08/16/16 2324 08/17/16 0434  CALCIUM 7.2*  < > 5.9* 6.2* 6.5*  MG 3.1*  --   --   --   --   PHOS 8.2*  --  6.0*  --   --   < > = values in this interval not  displayed.  CBC  Recent Labs Lab 08/15/16 1725 08/15/16 2130 08/17/16 0434  WBC 16.0*  --  16.0*  HGB 8.4* 9.5* 8.6*  HCT 24.3* 28.0* 24.8*  PLT 273  --  208    Coag's  Recent Labs Lab 08/15/16 1823  INR 1.45    Sepsis Markers  Recent Labs Lab 08/15/16 1744 08/15/16 2131 08/16/16 0221  LATICACIDVEN 4.57* 1.28  --   PROCALCITON  --   --  5.68    ABG No results for input(s): PHART, PCO2ART, PO2ART in the last 168 hours.  Liver Enzymes  Recent Labs Lab 08/15/16 2213 08/16/16 1601  AST 34  --   ALT 16*  --   ALKPHOS 88  --   BILITOT 1.8*  --   ALBUMIN 2.7* 2.1*    Cardiac Enzymes No results for input(s): TROPONINI, PROBNP in the last 168 hours.  Glucose  Recent Labs Lab 08/16/16 0121 08/16/16 2021 08/17/16 0404  GLUCAP 114* 155* 96    Imaging US Renal  Result Date: 08/16/2016 CLINICAL DATA:  70 year old hypertensive male with acute renal failure. Left renal mass. Subsequent encounter. EXAM: RENAL / URINARY TRACT ULTRASOUND COMPLETE COMPARISON:  10/08/2015 ultrasound.  01/08/2015 CT FINDINGS: Right Kidney: Length: 12 cm.  Moderate right hydronephrosis.  Stent in place. Left Kidney: Length: 11.8 cm. No  hydronephrosis. Upper pole 3.9 x 3.6 x 3.7 cm mass worrisome for malignancy without significant change from prior CT. Adjacent 2.2 x 1.2 x 2.2 cm cyst. Bladder: Stent in place. Trace free fluid. IMPRESSION: Right-sided double-J ureteral stents is in place with moderate right-sided hydronephrosis. Left upper pole renal mass measuring up to 3.9 cm without significant change from prior CT. This has characteristics worrisome for malignancy. If further delineation is clinically desired, dedicated renal MR (preferably) or CT may be considered. Electronically Signed   By: Genia Del [name]   On: 08/16/2016 10:49    STUDIES:  CT head 11/5 > Large area of tumoral edema involving the left temporal and parietal regions likely due to extra-axial metastatic prostate  cancer invading brain and left orbit. Recommend MRI brain without and with contrast for further evaluation. Mass effect on the left lateral ventricle and 5.5 mm of left-to-right shift.  CULTURES: Blood 11/5 > Urine 11/5 >  ANTIBIOTICS: Zosyn 11/5 > Vancomycin 11/5 >  SIGNIFICANT EVENTS: 11/05 Transfer from [place], renal consulted, DNR established 11/06 Palliative care consulted  LINES/TUBES: Port >   DISCUSSION: 70 yo male with Lt lower leg cellulitis, sepsis, AKI, hyperkalemia.  He has metastatic cancer with Lt renal mass, presumed to be metastatic renal cell cancer.  He also has treatment resistant prostate cancer.  Not candidate for HD.  DNR established.  ASSESSMENT / PLAN:  Septic shock 2nd to Lt lower leg cellulitis. -discontinue vanc and zosyn  - wean pressors to keep SBP > 85 -start midodrine   AKI >> baseline creatinine 0.8 from 07/06/16. Anion gap/non anion gap metabolic acidosis. Hyperkalemia. - d/c labs  Moderate protein calorie malnutrition. - clear liquid diet and advance as tolerated  Anemia of critical illness and chronic disease. - d/c labs   Acute encephalopathy 2nd to sepsis, AKI, metastatic cancer. - monitor mental status - decadron for brain edema  Pain r/t metastatic cancer  -prn fentanyl   Hx of OSA. - CPAP as able  Hx of PE. - d/c heparin gtt   DVT prophylaxis - heparin gtt per pharmacy SUP - protonix Goals of care - DNR/DNI, not HD candidate. Plan to go home with hospice on 11/8  Hayden Pedro, AG-ACNP [facility]  Pgr: [phone]  PCCM Pgr: [phone]  Attending:  I have seen and examined the patient with nurse practitioner/resident and agree with the note above.  We formulated the plan together and I elicited the following history.    Pain treated with prn fentanyl, still having some back pain Hyperkalemia Still on levophed  On exam Sleepy Lungs clear CV > RRR, no mgr  Septic shock> goals  of care are full comfort, transition to home.  Will start midodrine to support blood pressure, then rapidly wean off levophed, monitor carefully in ICU setting briefly and then attempt to transport him home.  See my note from yesterday, the family understands that he has days to live and our goals of care are comfort.  Will stop labs and antibiotics.  Agree that inpatient hospice but family refuses, wants him home. Stop all labs, stop all antibiotics and heparin.    CC time 31 minutes  Roselie Awkward, MD [facility] Pager: [phone] Cell: [phone] After 3pm or if no response, call [phone]

## 2016-08-17 NOTE — Discharge Summary (Signed)
Physician Discharge Summary  Patient ID: Rudene Re. MRN: [id number] DOB/AGE: November 15, 1945 70 y.o.  Admit date: 08/15/2016 Discharge date: 08/18/2016    Discharge Diagnoses:  Septic Shock Secondary to Left lower leg Cellulitis  Hypotension secondary to severe sepsis and dehydration Acute Kidney Injury  Anion Gap/Non Anion Gap metabolic Acidosis  Hyperkalemia  Moderate Protein calorie malnutrition  Anemia of critical illness and chronic disease  Acute encephalopathy secondary to sepsis, acute kidney injury, and metastatic cancer  Metastatic renal cell carcinoma with history of prostate Ca Chronic Pain related to metastatic cancer  Obstructive Sleep Apnea  History of Pulmonary Embolism                                                                         DISCHARGE PLAN BY DIAGNOSIS    Discharge Plan: Under the wishes of family and patient, [name] has been made a DNR-Comfort Care. He will go home with the involvement of hospice. All neccessary medical supplies have been delivered to the home. A hospice nurse is scheduled to arrive at the home to help with this transition of care. At home patient can use condom cath and rectal pouch as needed or per family preference. No need to replace rectal pouch if becomes dislodged.                 DISCHARGE SUMMARY   Phoenyx Paulsen. is a 70 y.o. y/o male with a PMH significant for obstructive sleep apnea on CPAP, PE on Coumadin, acute renal failure, and arthritis. He also has a long history of cancer initially with prostate cancer diagnosed in 2011. He has been on several escalating therapies per oncology notes from [facility]. Each of which have been stopped due to progression of disease. Most recently was found to have bone and large brain metastases and is now status post palliative whole brain radiotherapy. This is thought to be secondary to renal cell carcinoma. Last treatment 10/4 with minimal decrease in  size of mass on CT in ER 11/5. Oncology no longer recommends any type of treatment for this. He reportedly has a life expectancy of about 2 months at this point. Family was planning to initiate hospice consultation 11/6, however, 11/5 [name] fell at home in the morning. After his fall he took a nap for several hours and was much less responsive upon waking up. His family brought him to the emergency department for this reason. Of note he has had red rash on left lower extremity, which has been worsening, and treated with antifungal topical ointment. Upon arrival to the emergency department he was hypotensive with systolic blood pressures in the 70s. He received 3 L of IV fluid in the emergency department and blood pressure has slightly improved with systolics in the 90 to 588F. Lactic acid remains normal. On laboratory evaluation was found to have severe elevation in his serum creatinine at 6.7 and profound hyperkalemia greater than 7.5. Of note family reports very poor by mouth intake over the last few days. He was treated with temporizing measures in the emergency department. CT of the head and C-spine was done in the setting of falls, and demonstrated persistent large tumor, with about  5.5 mm of midline shift. Chest x-ray with no acute findings. During hospital stay patient was treated with vancomycin and zosyn for septic shock due to left lower leg cellulitis. In setting of sepsis, patient was also hypotensive and in acute renal failure requiring the use of vasopressors and multiple bolus of fluids, nephrology was consulted and deemed that patient was not a hemo-dialysis candidate. Family ultimately decided that it was in the patient's best interest to go home with hospice. Palliative care, Hospice, and Case management were consulted. Medical equipment was delivered to the home and the patient was transported by [facility].    SIGNIFICANT DIAGNOSTIC STUDIES CT head 11/5 > Large area of tumoral edema involving  the left temporal and parietal regions likely due to extra-axial metastatic prostate cancer invading brain and left orbit. Mass effect on the left lateral ventricle and 5.5 mm of left-to-right shift.  MICRO DATA  11/5 Blood Culture > negative  11/5 Urine Culture > negative   ANTIBIOTICS Zosyn 11/5 >11/7 Vancomycin 11/5 >11/7  CONSULTS Nephrology  Palliative Care  Hospice   TUBES / LINES Right Chest Port   Discharge Exam: General: Alert, no acute distress  Neuro: follows commands  CV: regular rate and rhythm, s1.s2  PULM: clear diminished GI: active bowel sounds, no tenderness Extremities: intact   Vitals:   08/18/16 0700 08/18/16 0800 08/18/16 0900 08/18/16 1000  BP: (!) 86/55 95/62 (!) 92/56 (!) 89/57  Pulse: 72 66 67 65  Resp: '16 15 17 15  '$ Temp:      TempSrc:      SpO2: 100% 100% 100% 100%  Weight:      Height:         Discharge Labs  BMET  Recent Labs Lab 08/15/16 2213 08/16/16 0221 08/16/16 0617 08/16/16 1601 08/16/16 2324 08/17/16 0434  NA 129* 123* 132* 132* 132* 132*  K >7.5* 5.5* 5.8* 5.7* 5.4* 5.0  CL 96* 97* 103 103 103 103  CO2 13* 13* 14* 16* 15* 15*  GLUCOSE 74 486* 187* 176* 165* 149*  BUN 110* 84* 89* 93* 94* 93*  CREATININE 6.71* 4.95* 5.02* 4.82* 4.63* 4.56*  CALCIUM 7.2* 5.6* 5.9* 5.9* 6.2* 6.5*  MG 3.1*  --   --   --   --   --   PHOS 8.2*  --   --  6.0*  --   --     CBC  Recent Labs Lab 08/15/16 1725 08/15/16 2130 08/17/16 0434  HGB 8.4* 9.5* 8.6*  HCT 24.3* 28.0* 24.8*  WBC 16.0*  --  16.0*  PLT 273  --  208    Anti-Coagulation  Recent Labs Lab 08/15/16 1823  INR 1.45    Discharge Instructions    Increase activity slowly    Complete by:  As directed            Medication List    STOP taking these medications   calcium carbonate 1250 (500 Ca) MG chewable tablet Commonly known as:  OS-CAL   enoxaparin 100 MG/ML injection Commonly known as:  LOVENOX   leuprolide 30 MG injection Commonly known as:   LUPRON   LYNPARZA 50 MG capsule Generic drug:  olaparib   MYRBETRIQ 50 MG Tb24 tablet Generic drug:  mirabegron ER   pantoprazole 40 MG tablet Commonly known as:  PROTONIX     TAKE these medications   clotrimazole 1 % cream Commonly known as:  LOTRIMIN Apply 1 application topically 2 (two) times daily.   lidocaine-prilocaine cream  Commonly known as:  EMLA Apply topically as needed. What changed:  how much to take  reasons to take this   loratadine 10 MG tablet Commonly known as:  CLARITIN Take 10 mg by mouth daily.   meclizine 25 MG tablet Commonly known as:  ANTIVERT Take 25 mg by mouth 3 (three) times daily as needed for dizziness.   ondansetron 8 MG tablet Commonly known as:  ZOFRAN TAKE 1 TABLET BY MOUTH EVERY 8 HOURS AS NEEDED FOR NAUSEA OR VOMITING.   oxyCODONE 5 MG immediate release tablet Commonly known as:  Oxy IR/ROXICODONE Take 5 mg by mouth every 6 (six) hours.       Disposition: To home with hospice care.   Discharged Condition: Jalil Lorusso. has met maximum benefit of inpatient care and is medically stable and cleared for discharge.      Time spent on disposition:  Greater than 35 minutes.   Hayden Pedro, AG-ACNP [facility]  Pgr: [phone]  PCCM Pgr: [phone]

## 2016-08-17 NOTE — Care Management Note (Addendum)
Case Management Note  Patient Details  Name: [name]. MRN: [id number] Date of Birth: 1946-02-01  Subjective/Objective:                    Action/Plan:  PTA from home with wife.  Pt has had extended illness and family has already reached out to [facility] prior to admit - however services were never initated/ arranged.  Pt is currently on pressors for soft BP   Expected Discharge Date:                  Expected Discharge Plan:  Home w Hospice Care  In-House Referral:     Discharge planning Services  CM Consult  Post Acute Care Choice:    Choice offered to:     DME Arranged:    DME Agency:     HH Arranged:    HH Agency:     Status of Service:  In process, will continue to follow  If discussed at Long Length of Stay Meetings, dates discussed:    Additional Comments: 08/17/2016  Pressor discontinued.  Plan per attending is to observe pt off of pressor to determine if transport home is even an option.  CM confirmed with [facility] that needed equipment will be delivered prior to 6pm today.  Wife is at home preparing for pts return home.  Plan is for pt to discharge home in the am with hospice via [facility].   Pt remains on pressor and at this time attending doesn't feel pt is stable  for transfer home.  CM contacted by Hospice late in the day yesterday ; pt has been accepted by agency, agency met with pts wife yesterday afternoon and equipment will be delivered sometime today.  CM will continue to follow for discharge needs  08/16/16 CM received referral for home with hospice.  CM and attending spoke in depth with attending.  Wife is adamant about pt returning home - even with limited support provided by hospice agency.  Wife will care for pt completely when home.  Wife stated she would like to use [facility].  Attending reviewed plan with wife - attempt to wean pt completely off pressor and possibly transport pt home tomorrow with hospice.  CM contacted [facility] and informed [place] of pending  referral.  Attending also informed wife that if pt is not able to wean off of pressor residential hospice may be required or hospital death may be imminent - CSW consulted Maryclare Labrador, RN 08/17/2016, 9:08 AM

## 2016-08-18 ENCOUNTER — Telehealth: Payer: Self-pay | Admitting: Oncology

## 2016-08-18 DIAGNOSIS — Z515 Encounter for palliative care: Secondary | ICD-10-CM

## 2016-08-18 DIAGNOSIS — C799 Secondary malignant neoplasm of unspecified site: Secondary | ICD-10-CM

## 2016-08-18 MED ORDER — HEPARIN SOD (PORK) LOCK FLUSH 100 UNIT/ML IV SOLN
500.0000 [IU] | INTRAVENOUS | Status: AC | PRN
  Administered 2016-08-18: 500 [IU]

## 2016-08-18 MED ORDER — ONDANSETRON HCL 8 MG PO TABS: ORAL_TABLET | ORAL | 1 refills | Status: DC

## 2016-08-18 MED ORDER — OXYCODONE HCL 5 MG PO TABS: 5.0000 mg | ORAL_TABLET | Freq: Four times a day (QID) | ORAL | 0 refills | Status: AC

## 2016-08-18 MED ORDER — ONDANSETRON HCL 8 MG PO TABS: ORAL_TABLET | ORAL | 1 refills | Status: AC

## 2016-08-18 MED ORDER — OXYCODONE HCL 5 MG PO TABS: 5.0000 mg | ORAL_TABLET | Freq: Four times a day (QID) | ORAL | 0 refills | Status: DC

## 2016-08-18 NOTE — Progress Notes (Signed)
Pt discharged home with [facility]. No belongings noted with pt. Called pts wife and made her aware pt was on the way home with [facility]. All discharge instructions and paper work given in Theme park manager with [place].

## 2016-08-18 NOTE — Care Management Note (Addendum)
Case Management Note  Patient Details  Name: [name]. MRN: [id number] Date of Birth: 1946-07-25  Subjective/Objective:                    Action/Plan:  PTA from home with wife.  Pt has had extended illness and family has already reached out to [facility] prior to admit - however services were never initated/ arranged.  Pt is currently on pressors for soft BP   Expected Discharge Date:                  Expected Discharge Plan:  Home w Hospice Care  In-House Referral:     Discharge planning Services  CM Consult  Post Acute Care Choice:    Choice offered to:     DME Arranged:    DME Agency:     HH Arranged:    HH Agency:     Status of Service:  In process, will continue to follow  If discussed at Long Length of Stay Meetings, dates discussed:    Additional Comments: 08/18/2016  CM contacted wife and pt is safely at home with hopsice  Pt has been discharged.  CM spoke with family this am and equipment has been delivered to home , CM verified transport to address and informed family that pt is scheduled to leave [facility] during 12 oclock hour.  [facility] actively following pt and has a RN tentatively scheduled to meet pt at home during 1 oclock hour.  CM has arranged for [facility] to pick pt up from [facility] around 12pm and transport home.  Pt is not on any drips nor supplemental oxygen.  CM faxed scripts and discharge summary to [phone] as requested.  DNR form, original prescription, facesheet (with SS number)  and transport resport are all in transport packet.   08/17/16 Pressor discontinued.  Plan per attending is to observe pt off of pressor to determine if transport home is even an option.  CM confirmed with [facility] that needed equipment will be delivered prior to 6pm today.  Wife is at home preparing for pts return home.  Plan is for pt to discharge home in the am with hospice via [place].   Pt remains on pressor and at this time attending doesn't feel pt is stable  for transfer home.  CM  contacted by Hospice late in the day yesterday ; pt has been accepted by agency, agency met with pts wife yesterday afternoon and equipment will be delivered sometime today.  CM will continue to follow for discharge needs  08/16/16 CM received referral for home with hospice.  CM and attending spoke in depth with attending.  Wife is adamant about pt returning home - even with limited support provided by hospice agency.  Wife will care for pt completely when home.  Wife stated she would like to use [facility].  Attending reviewed plan with wife - attempt to wean pt completely off pressor and possibly transport pt home tomorrow with hospice.  CM contacted [facility] and informed Evette of pending referral.  Attending also informed wife that if pt is not able to wean off of pressor residential hospice may be required or hospital death may be imminent - CSW consulted Maryclare Labrador, RN 08/18/2016, 11:23 AM

## 2016-08-18 NOTE — Telephone Encounter (Signed)
11/9 Appointment canceled per patient request. The patient is in the hospital. Wife called

## 2016-08-18 NOTE — Discharge Instructions (Signed)
Hospice °Hospice is a service that is designed to provide people who are terminally ill and their families with medical, spiritual, and psychological support. Its aim is to improve your quality of life by keeping you as alert and comfortable as possible. Hospice is performed by a team of health care professionals and volunteers who: °· Help keep you comfortable. Hospice can be provided in your home or in a homelike setting. The hospice staff works with your family and friends to help meet your needs. You will enjoy the support of loved ones by receiving much of your basic care from family and friends. °· Provide pain relief and manage your symptoms. The staff supply all necessary medicines and equipment. °· Provide companionship when you are alone. °· Allow you and your family to rest. They may do light housekeeping, prepare meals, and run errands. °· Provide counseling. They will make sure your emotional, spiritual, and social needs and those of your family are being met. °· Provide spiritual care. Spiritual care is individualized to meet your needs and your family's needs. It may involve helping you look at what death means to you, say goodbye, or perform a specific religious ceremony or ritual. °Hospice teams often include: °· A nurse. °· A doctor. °· Social workers. °· Religious leaders (such as a chaplain). °· Trained volunteers. °WHEN SHOULD HOSPICE CARE BEGIN? °Most people who use hospice are believed to have fewer than 6 months to live. Your family and health care providers can help you decide when hospice services should begin. If your condition improves, you may discontinue the program. °WHAT SHOULD I CONSIDER BEFORE SELECTING A PROGRAM? °Most hospice programs are run by nonprofit, independent organizations. Some are affiliated with hospitals, nursing homes, or home health care agencies. Hospice programs can take place in the home or at a hospice center, hospital, or skilled nursing facility. When choosing  a hospice program, ask the following questions: °· What services are available to me? °· What services are offered to my loved ones? °· How involved are my loved ones? °· How involved is my health care provider? °· Who makes up the hospice care team? How are they trained or screened? °· How will my pain and symptoms be managed? °· If my circumstances change, can the services be provided in a different setting, such as my home or in the hospital? °· Is the program reviewed and licensed by the state or certified in some other way? °WHERE CAN I LEARN MORE ABOUT HOSPICE? °You can learn about existing hospice programs in your area from your health care providers. You can also read more about hospice online. The websites of the following organizations contain helpful information: °· The National Hospice and Palliative Care Organization (NHPCO). °· The Hospice Association of America (HAA). °· The Hospice Education Institute. °· The American Cancer Society (ACS). °· Hospice Net. °  °This information is not intended to replace advice given to you by your health care provider. Make sure you discuss any questions you have with your health care provider. °  °Document Released: 01/14/2004 Document Revised: 10/02/2013 Document Reviewed: 08/07/2013 °Elsevier Interactive Patient Education ©2016 [employer] ° °

## 2016-08-19 ENCOUNTER — Ambulatory Visit: Payer: Medicare Other | Admitting: Oncology

## 2016-08-20 LAB — CULTURE, BLOOD (ROUTINE X 2)
CULTURE: NO GROWTH
Culture: NO GROWTH

## 2016-10-11 DEATH — deceased

## 2016-12-13 IMAGING — CT CT HEAD W/O CM
2 of 3 series · 15 of 37 positions shown, 18 images · non-contrast
Comparison: None.

CLINICAL DATA: Fell tonight. Found down. History of metastatic
prostate cancer.

EXAM:
CT HEAD WITHOUT CONTRAST
CT CERVICAL SPINE WITHOUT CONTRAST
TECHNIQUE: Multidetector CT imaging of the head and cervical spine was
performed following the standard protocol without intravenous
contrast. Multiplanar CT image reconstructions of the cervical spine
were also generated.

[Series 3: c_spine 2.0 st · axial · 0.42mm/px · z∈[+126,+288]mm · 12 of 95 slices shown, 15 images]
[im 7/95  brain]
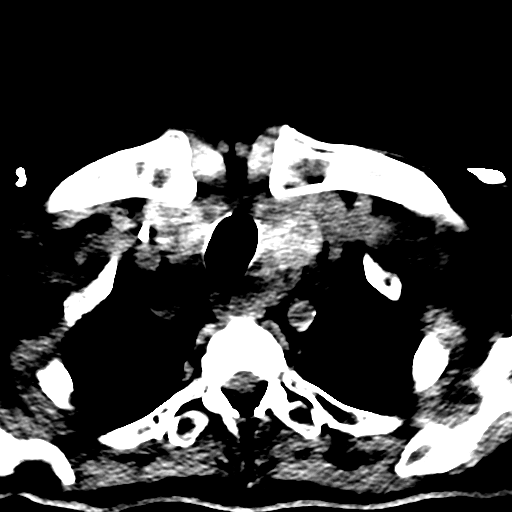
[im 7/95  bone]
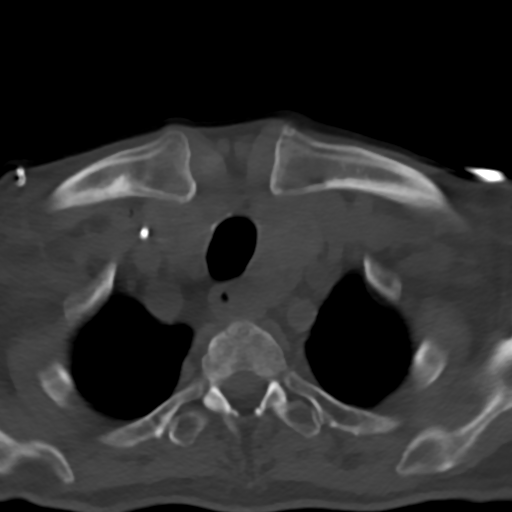
[im 13/95  brain]
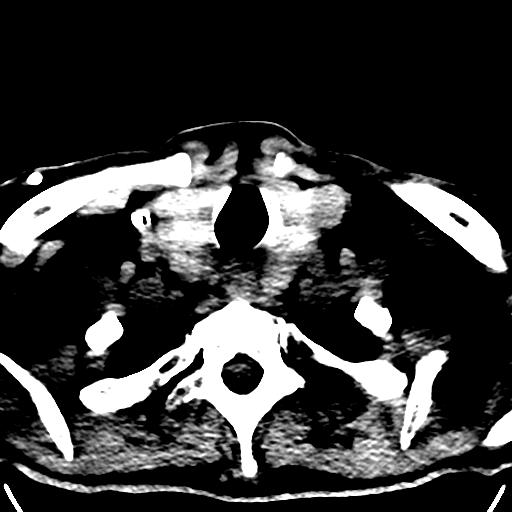
[im 19/95  brain]
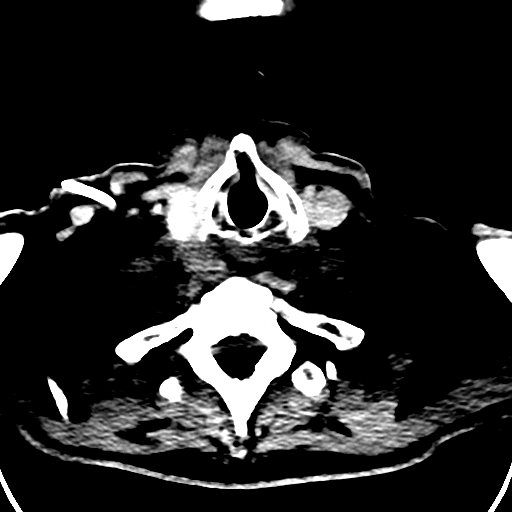
[im 32/95  brain]
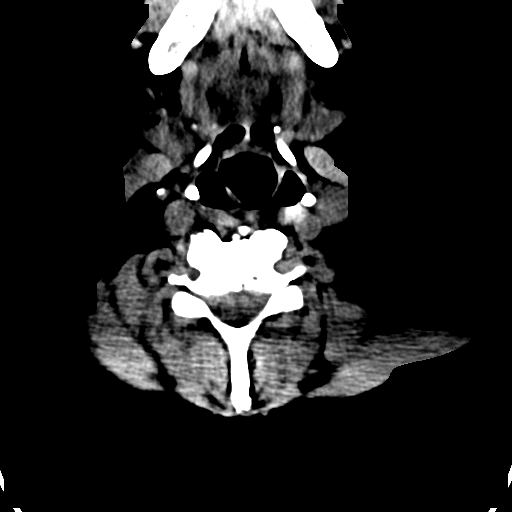
[im 38/95  brain]
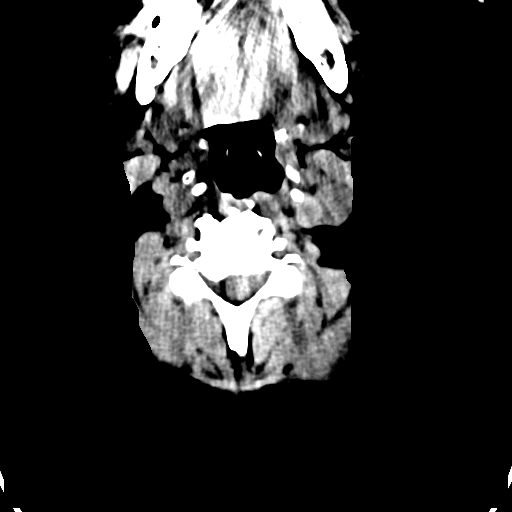
[im 38/95  bone]
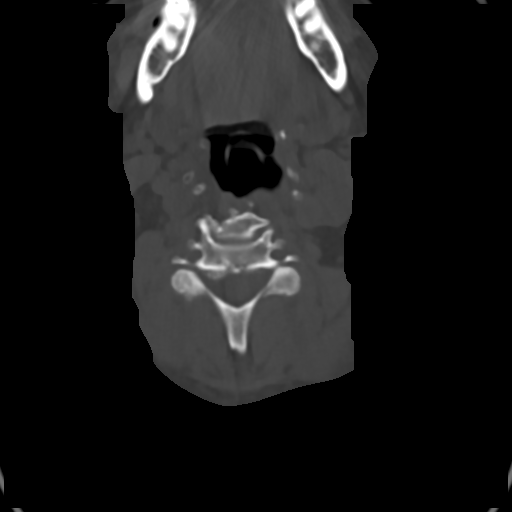
[im 44/95  brain]
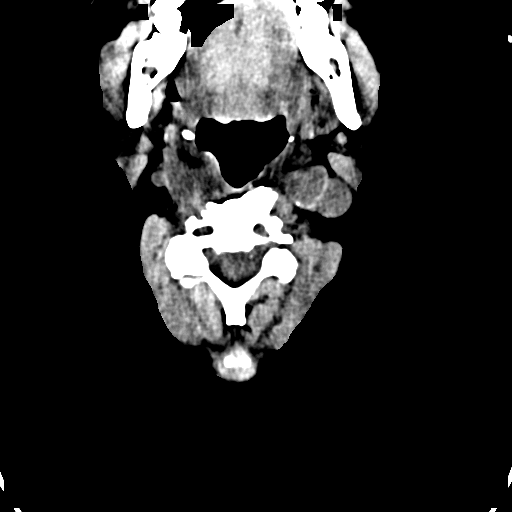
[im 51/95  brain]
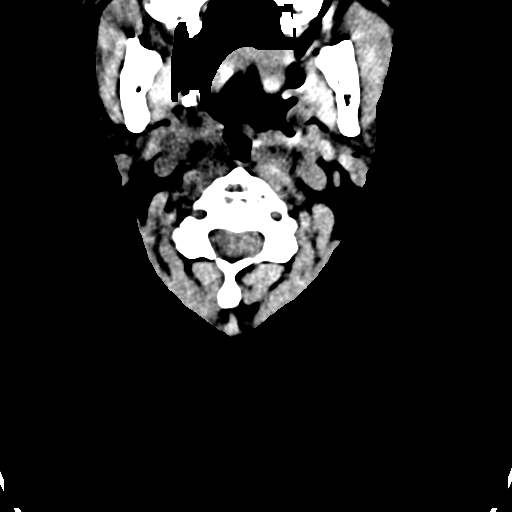
[im 57/95  brain]
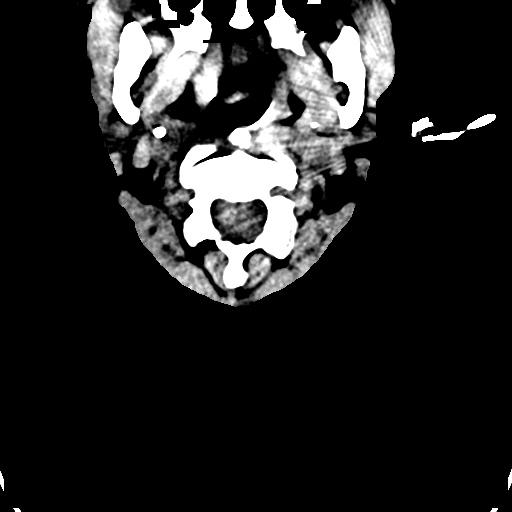
[im 63/95  brain]
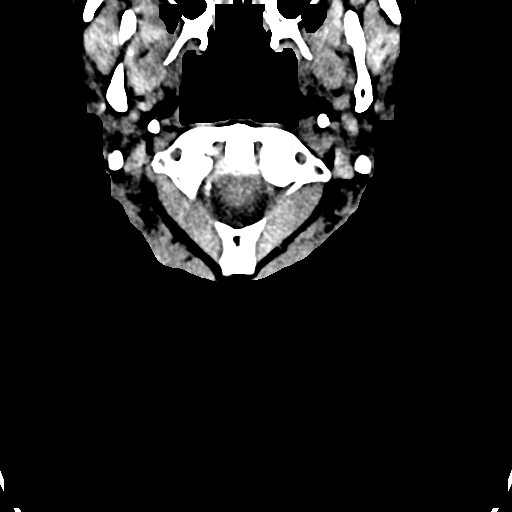
[im 63/95  bone]
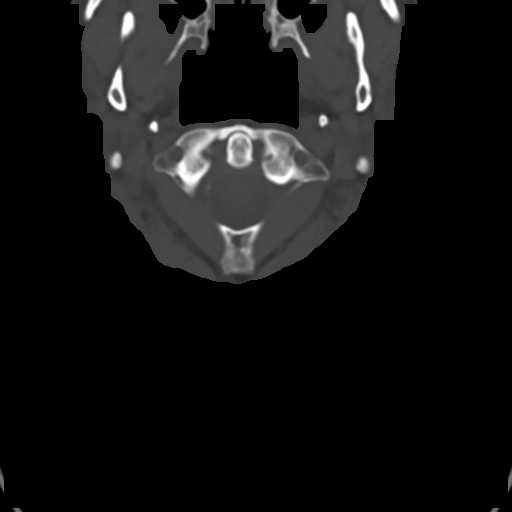
[im 76/95  brain]
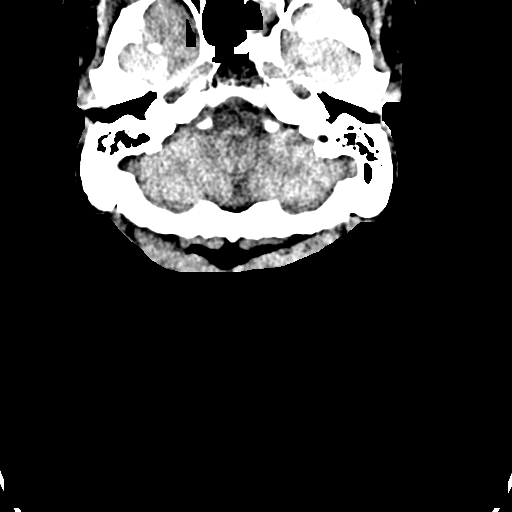
[im 82/95  brain]
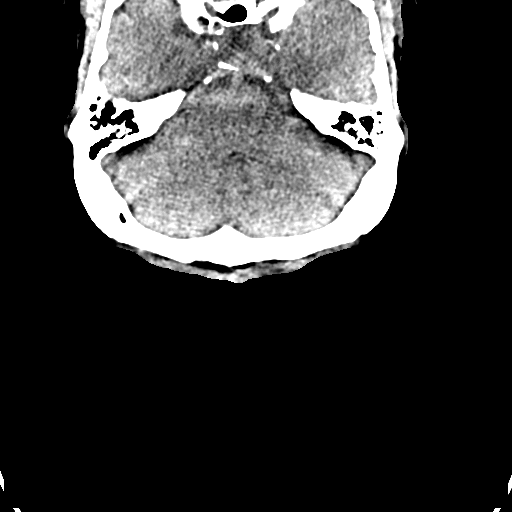
[im 88/95  brain]
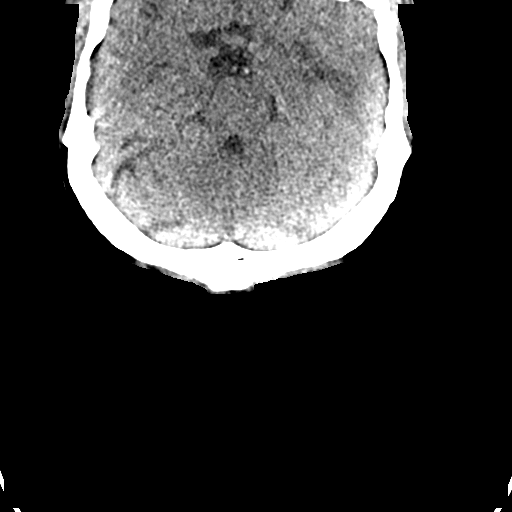

[Series 5: c_spine 2.0 sag bone · sagittal · 0.45mm/px · 3 of 61 slices shown]
[im 21/61  brain]
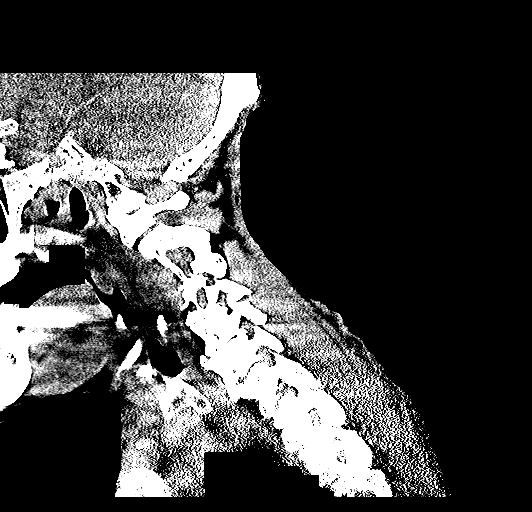
[im 31/61  brain]
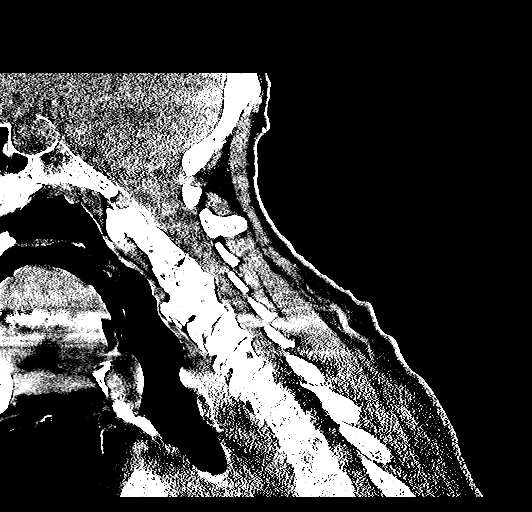
[im 41/61  brain]
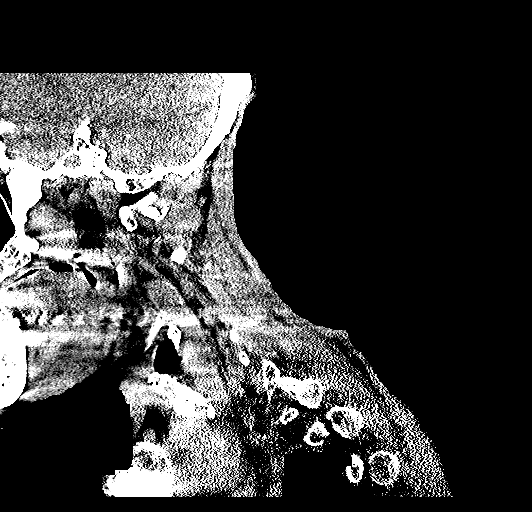

[15 of 37 positions shown; findings below may reference images not displayed]

FINDINGS: CT HEAD FINDINGS

Brain: Large area of tumoral edema involving the left temporal and
parietal regions. There appears to be a large extra-axial mass
involving the left frontal bone and left sphenoid wing with tumor in
the left orbit and probably extending up into the brain. There is
significant mass effect on the sylvian fissure. There is mass effect
on the left lateral ventricle and left-to-right shift of 5.5 mm. New
line the brainstem and cerebellum are grossly normal. No downward
transtentorial herniation. No acute hemorrhage.

Vascular: Vascular calcifications.  No definite aneurysm.

Skull: Metastatic prostate cancer involving the left frontal bone
and sphenoid wing with a soft tissue component invading the orbit
and brain. No pathologic fractures identified.

Sinuses/Orbits: Opacified left half sphenoid sinus. The other
paranasal sinuses are clear. The mastoid air cells are clear.

Other: No scalp lesions.

CT CERVICAL SPINE FINDINGS

Alignment: Advanced degenerative cervical spondylosis with
multilevel disc disease and facet disease. Lytic and sclerotic
changes involving the cervical vertebral bodies consistent with
metastasis. Partial interbody fusion noted at C2-3. The spinal canal
is generous. No significant spinal stenosis. Number no acute
cervical spine fracture.
IMPRESSION: 1. Large area of tumoral edema involving the left temporal and
parietal regions likely due to extra-axial metastatic prostate
cancer invading brain and left orbit. Recommend MRI brain without
and with contrast for further evaluation.
2. Mass effect on the left lateral ventricle and 5.5 mm of
left-to-right shift.
3. No associated hemorrhage for downward transtentorial herniation.
4. Destructive changes involving the left frontal bone and sphenoid
wing
5. No acute skull or cervical spine fracture.
